# Patient Record
Sex: Male | Born: 1935 | Race: White | Hispanic: No | Marital: Married | Smoking: Never smoker
Health system: Southern US, Community
[De-identification: ages and names within clinical notes are randomized; demographics above are authoritative.]

## PROBLEM LIST (undated history)

## (undated) DIAGNOSIS — I10 Essential (primary) hypertension: Secondary | ICD-10-CM

## (undated) DIAGNOSIS — I442 Atrioventricular block, complete: Secondary | ICD-10-CM

## (undated) DIAGNOSIS — K219 Gastro-esophageal reflux disease without esophagitis: Secondary | ICD-10-CM

## (undated) DIAGNOSIS — C801 Malignant (primary) neoplasm, unspecified: Secondary | ICD-10-CM

## (undated) DIAGNOSIS — J189 Pneumonia, unspecified organism: Secondary | ICD-10-CM

## (undated) DIAGNOSIS — N529 Male erectile dysfunction, unspecified: Secondary | ICD-10-CM

## (undated) DIAGNOSIS — R011 Cardiac murmur, unspecified: Secondary | ICD-10-CM

## (undated) DIAGNOSIS — J45909 Unspecified asthma, uncomplicated: Secondary | ICD-10-CM

## (undated) DIAGNOSIS — R112 Nausea with vomiting, unspecified: Secondary | ICD-10-CM

## (undated) DIAGNOSIS — E739 Lactose intolerance, unspecified: Secondary | ICD-10-CM

## (undated) DIAGNOSIS — M1990 Unspecified osteoarthritis, unspecified site: Secondary | ICD-10-CM

## (undated) DIAGNOSIS — Z9889 Other specified postprocedural states: Secondary | ICD-10-CM

## (undated) DIAGNOSIS — N400 Enlarged prostate without lower urinary tract symptoms: Secondary | ICD-10-CM

## (undated) DIAGNOSIS — I330 Acute and subacute infective endocarditis: Secondary | ICD-10-CM

## (undated) DIAGNOSIS — G4733 Obstructive sleep apnea (adult) (pediatric): Secondary | ICD-10-CM

## (undated) DIAGNOSIS — T7840XA Allergy, unspecified, initial encounter: Secondary | ICD-10-CM

## (undated) DIAGNOSIS — Z950 Presence of cardiac pacemaker: Secondary | ICD-10-CM

## (undated) DIAGNOSIS — R0600 Dyspnea, unspecified: Secondary | ICD-10-CM

## (undated) DIAGNOSIS — I4891 Unspecified atrial fibrillation: Secondary | ICD-10-CM

## (undated) DIAGNOSIS — I509 Heart failure, unspecified: Secondary | ICD-10-CM

## (undated) HISTORY — PX: PACEMAKER PLACEMENT: SHX43

## (undated) HISTORY — DX: Lactose intolerance, unspecified: E73.9

## (undated) HISTORY — DX: Essential (primary) hypertension: I10

## (undated) HISTORY — DX: Heart failure, unspecified: I50.9

## (undated) HISTORY — DX: Unspecified asthma, uncomplicated: J45.909

## (undated) HISTORY — DX: Gastro-esophageal reflux disease without esophagitis: K21.9

## (undated) HISTORY — DX: Male erectile dysfunction, unspecified: N52.9

## (undated) HISTORY — PX: PARTIAL HIP ARTHROPLASTY: SHX733

## (undated) HISTORY — PX: INSERT / REPLACE / REMOVE PACEMAKER: SUR710

## (undated) HISTORY — DX: Obstructive sleep apnea (adult) (pediatric): G47.33

## (undated) HISTORY — DX: Atrioventricular block, complete: I44.2

## (undated) HISTORY — DX: Acute and subacute infective endocarditis: I33.0

## (undated) HISTORY — DX: Unspecified atrial fibrillation: I48.91

## (undated) HISTORY — DX: Allergy, unspecified, initial encounter: T78.40XA

## (undated) HISTORY — DX: Benign prostatic hyperplasia without lower urinary tract symptoms: N40.0

---

## 1998-12-17 ENCOUNTER — Ambulatory Visit (HOSPITAL_COMMUNITY): Admission: RE | Admit: 1998-12-17 | Discharge: 1998-12-17 | Payer: Self-pay | Admitting: Internal Medicine

## 1998-12-17 ENCOUNTER — Encounter: Payer: Self-pay | Admitting: Internal Medicine

## 2001-12-20 ENCOUNTER — Ambulatory Visit (HOSPITAL_COMMUNITY): Admission: RE | Admit: 2001-12-20 | Discharge: 2001-12-20 | Payer: Self-pay | Admitting: Internal Medicine

## 2002-02-21 ENCOUNTER — Encounter: Payer: Self-pay | Admitting: Internal Medicine

## 2002-02-21 ENCOUNTER — Encounter: Admission: RE | Admit: 2002-02-21 | Discharge: 2002-02-21 | Payer: Self-pay | Admitting: Internal Medicine

## 2002-03-29 ENCOUNTER — Encounter: Payer: Self-pay | Admitting: Internal Medicine

## 2002-03-29 LAB — HM COLONOSCOPY: HM Colonoscopy: NORMAL

## 2002-12-23 ENCOUNTER — Ambulatory Visit (HOSPITAL_COMMUNITY): Admission: RE | Admit: 2002-12-23 | Discharge: 2002-12-23 | Payer: Self-pay | Admitting: Urology

## 2004-09-25 ENCOUNTER — Ambulatory Visit: Payer: Self-pay | Admitting: Cardiology

## 2004-10-04 ENCOUNTER — Ambulatory Visit: Payer: Self-pay | Admitting: Cardiology

## 2004-10-23 ENCOUNTER — Ambulatory Visit: Payer: Self-pay | Admitting: Cardiology

## 2004-11-19 ENCOUNTER — Ambulatory Visit: Payer: Self-pay | Admitting: *Deleted

## 2004-12-19 ENCOUNTER — Ambulatory Visit: Payer: Self-pay | Admitting: Cardiology

## 2005-01-02 ENCOUNTER — Ambulatory Visit: Payer: Self-pay | Admitting: Cardiology

## 2005-01-22 ENCOUNTER — Ambulatory Visit: Payer: Self-pay | Admitting: Cardiology

## 2005-02-19 ENCOUNTER — Ambulatory Visit: Payer: Self-pay | Admitting: *Deleted

## 2005-03-19 ENCOUNTER — Ambulatory Visit: Payer: Self-pay | Admitting: Cardiology

## 2005-04-16 ENCOUNTER — Ambulatory Visit: Payer: Self-pay | Admitting: *Deleted

## 2005-05-14 ENCOUNTER — Ambulatory Visit: Payer: Self-pay | Admitting: *Deleted

## 2005-06-11 ENCOUNTER — Ambulatory Visit: Payer: Self-pay | Admitting: Cardiology

## 2005-07-09 ENCOUNTER — Ambulatory Visit: Payer: Self-pay | Admitting: Cardiology

## 2005-08-06 ENCOUNTER — Ambulatory Visit: Payer: Self-pay | Admitting: Cardiology

## 2005-08-18 ENCOUNTER — Ambulatory Visit (HOSPITAL_BASED_OUTPATIENT_CLINIC_OR_DEPARTMENT_OTHER): Admission: RE | Admit: 2005-08-18 | Discharge: 2005-08-18 | Payer: Self-pay | Admitting: Otolaryngology

## 2005-08-24 ENCOUNTER — Ambulatory Visit: Payer: Self-pay | Admitting: Internal Medicine

## 2005-09-03 ENCOUNTER — Ambulatory Visit: Payer: Self-pay | Admitting: Cardiology

## 2005-10-01 ENCOUNTER — Ambulatory Visit: Payer: Self-pay | Admitting: Cardiology

## 2005-10-08 ENCOUNTER — Ambulatory Visit (HOSPITAL_BASED_OUTPATIENT_CLINIC_OR_DEPARTMENT_OTHER): Admission: RE | Admit: 2005-10-08 | Discharge: 2005-10-08 | Payer: Self-pay | Admitting: Otolaryngology

## 2005-10-12 ENCOUNTER — Ambulatory Visit: Payer: Self-pay | Admitting: Internal Medicine

## 2005-10-29 ENCOUNTER — Ambulatory Visit: Payer: Self-pay | Admitting: *Deleted

## 2005-11-26 ENCOUNTER — Ambulatory Visit: Payer: Self-pay | Admitting: Cardiology

## 2005-12-24 ENCOUNTER — Ambulatory Visit: Payer: Self-pay | Admitting: Cardiology

## 2006-01-14 ENCOUNTER — Ambulatory Visit: Payer: Self-pay | Admitting: Internal Medicine

## 2006-01-20 ENCOUNTER — Ambulatory Visit: Payer: Self-pay | Admitting: Internal Medicine

## 2006-01-21 ENCOUNTER — Ambulatory Visit: Payer: Self-pay | Admitting: Internal Medicine

## 2006-02-18 ENCOUNTER — Ambulatory Visit: Payer: Self-pay | Admitting: *Deleted

## 2006-03-18 ENCOUNTER — Ambulatory Visit: Payer: Self-pay | Admitting: *Deleted

## 2006-04-15 ENCOUNTER — Ambulatory Visit: Payer: Self-pay | Admitting: Cardiology

## 2006-05-13 ENCOUNTER — Ambulatory Visit: Payer: Self-pay | Admitting: *Deleted

## 2006-06-10 ENCOUNTER — Ambulatory Visit: Payer: Self-pay | Admitting: *Deleted

## 2006-07-08 ENCOUNTER — Ambulatory Visit: Payer: Self-pay | Admitting: *Deleted

## 2006-08-05 ENCOUNTER — Ambulatory Visit: Payer: Self-pay | Admitting: Cardiovascular Disease

## 2006-09-01 ENCOUNTER — Ambulatory Visit: Payer: Self-pay | Admitting: Internal Medicine

## 2006-09-29 ENCOUNTER — Ambulatory Visit: Payer: Self-pay | Admitting: *Deleted

## 2006-10-16 ENCOUNTER — Ambulatory Visit: Payer: Self-pay | Admitting: Gastroenterology

## 2006-10-20 ENCOUNTER — Ambulatory Visit: Payer: Self-pay | Admitting: Cardiology

## 2006-10-20 ENCOUNTER — Ambulatory Visit (HOSPITAL_COMMUNITY): Admission: RE | Admit: 2006-10-20 | Discharge: 2006-10-20 | Payer: Self-pay | Admitting: Gastroenterology

## 2006-11-27 ENCOUNTER — Ambulatory Visit: Payer: Self-pay | Admitting: Cardiology

## 2006-12-11 ENCOUNTER — Ambulatory Visit: Payer: Self-pay | Admitting: Cardiovascular Disease

## 2007-01-08 ENCOUNTER — Ambulatory Visit: Payer: Self-pay | Admitting: Cardiology

## 2007-01-15 ENCOUNTER — Ambulatory Visit: Payer: Self-pay | Admitting: Internal Medicine

## 2007-02-01 ENCOUNTER — Ambulatory Visit: Payer: Self-pay

## 2007-02-01 ENCOUNTER — Encounter: Payer: Self-pay | Admitting: Cardiology

## 2007-02-05 ENCOUNTER — Ambulatory Visit: Payer: Self-pay | Admitting: Cardiovascular Disease

## 2007-03-05 ENCOUNTER — Ambulatory Visit: Payer: Self-pay | Admitting: Cardiovascular Disease

## 2007-04-02 ENCOUNTER — Ambulatory Visit: Payer: Self-pay | Admitting: Cardiology

## 2007-04-30 ENCOUNTER — Ambulatory Visit: Payer: Self-pay | Admitting: Cardiology

## 2007-06-04 ENCOUNTER — Ambulatory Visit: Payer: Self-pay | Admitting: Cardiology

## 2007-07-02 ENCOUNTER — Ambulatory Visit: Payer: Self-pay | Admitting: Cardiology

## 2007-07-21 ENCOUNTER — Encounter: Payer: Self-pay | Admitting: Internal Medicine

## 2007-08-03 ENCOUNTER — Ambulatory Visit: Payer: Self-pay | Admitting: Cardiology

## 2007-08-18 ENCOUNTER — Ambulatory Visit: Payer: Self-pay | Admitting: Internal Medicine

## 2007-08-27 ENCOUNTER — Ambulatory Visit: Payer: Self-pay | Admitting: Internal Medicine

## 2007-09-27 ENCOUNTER — Ambulatory Visit: Payer: Self-pay | Admitting: Cardiology

## 2007-10-04 ENCOUNTER — Inpatient Hospital Stay (HOSPITAL_COMMUNITY): Admission: RE | Admit: 2007-10-04 | Discharge: 2007-10-06 | Payer: Self-pay | Admitting: Orthopedic Surgery

## 2007-10-18 ENCOUNTER — Encounter: Payer: Self-pay | Admitting: Internal Medicine

## 2007-10-21 DIAGNOSIS — I4891 Unspecified atrial fibrillation: Secondary | ICD-10-CM

## 2007-10-21 DIAGNOSIS — J309 Allergic rhinitis, unspecified: Secondary | ICD-10-CM | POA: Insufficient documentation

## 2007-10-21 DIAGNOSIS — J4489 Other specified chronic obstructive pulmonary disease: Secondary | ICD-10-CM | POA: Insufficient documentation

## 2007-10-21 DIAGNOSIS — J449 Chronic obstructive pulmonary disease, unspecified: Secondary | ICD-10-CM

## 2007-10-21 DIAGNOSIS — R351 Nocturia: Secondary | ICD-10-CM

## 2007-10-21 DIAGNOSIS — I4821 Permanent atrial fibrillation: Secondary | ICD-10-CM | POA: Insufficient documentation

## 2007-10-21 DIAGNOSIS — K219 Gastro-esophageal reflux disease without esophagitis: Secondary | ICD-10-CM

## 2007-10-21 DIAGNOSIS — N401 Enlarged prostate with lower urinary tract symptoms: Secondary | ICD-10-CM

## 2007-10-21 DIAGNOSIS — G4733 Obstructive sleep apnea (adult) (pediatric): Secondary | ICD-10-CM

## 2007-10-21 DIAGNOSIS — I330 Acute and subacute infective endocarditis: Secondary | ICD-10-CM | POA: Insufficient documentation

## 2007-10-21 DIAGNOSIS — I10 Essential (primary) hypertension: Secondary | ICD-10-CM

## 2007-10-22 ENCOUNTER — Ambulatory Visit: Payer: Self-pay | Admitting: Internal Medicine

## 2007-10-22 DIAGNOSIS — M8580 Other specified disorders of bone density and structure, unspecified site: Secondary | ICD-10-CM

## 2007-10-22 DIAGNOSIS — E739 Lactose intolerance, unspecified: Secondary | ICD-10-CM

## 2007-10-22 HISTORY — DX: Lactose intolerance, unspecified: E73.9

## 2007-10-25 ENCOUNTER — Encounter: Payer: Self-pay | Admitting: Internal Medicine

## 2007-10-26 LAB — CONVERTED CEMR LAB
BUN: 23 mg/dL (ref 6–23)
CO2: 31 meq/L (ref 19–32)
Creatinine, Ser: 1.1 mg/dL (ref 0.4–1.5)
HDL: 73.4 mg/dL (ref >39.0)
Potassium: 4.8 meq/L (ref 3.5–5.1)
Triglycerides: 45 mg/dL (ref 0–149)

## 2007-11-15 ENCOUNTER — Ambulatory Visit: Payer: Self-pay | Admitting: Cardiology

## 2007-12-13 ENCOUNTER — Ambulatory Visit: Payer: Self-pay | Admitting: Cardiology

## 2008-01-10 ENCOUNTER — Ambulatory Visit: Payer: Self-pay | Admitting: Cardiovascular Disease

## 2008-02-07 ENCOUNTER — Ambulatory Visit: Payer: Self-pay | Admitting: Internal Medicine

## 2008-03-06 ENCOUNTER — Ambulatory Visit: Payer: Self-pay | Admitting: Cardiology

## 2008-03-31 ENCOUNTER — Ambulatory Visit: Payer: Self-pay | Admitting: Cardiology

## 2008-04-14 ENCOUNTER — Ambulatory Visit: Payer: Self-pay | Admitting: Internal Medicine

## 2008-04-14 DIAGNOSIS — R232 Flushing: Secondary | ICD-10-CM

## 2008-04-28 ENCOUNTER — Ambulatory Visit: Payer: Self-pay | Admitting: Cardiovascular Disease

## 2008-05-29 ENCOUNTER — Ambulatory Visit: Payer: Self-pay | Admitting: Internal Medicine

## 2008-06-26 ENCOUNTER — Ambulatory Visit: Payer: Self-pay | Admitting: Cardiology

## 2008-07-19 ENCOUNTER — Encounter: Payer: Self-pay | Admitting: Internal Medicine

## 2008-07-19 LAB — CONVERTED CEMR LAB: Vit D, 25-Hydroxy: 32.4 ng/mL

## 2008-07-24 ENCOUNTER — Ambulatory Visit: Payer: Self-pay | Admitting: Cardiology

## 2008-08-08 ENCOUNTER — Ambulatory Visit: Payer: Self-pay | Admitting: Internal Medicine

## 2008-08-21 ENCOUNTER — Ambulatory Visit: Payer: Self-pay | Admitting: Cardiovascular Disease

## 2008-09-18 ENCOUNTER — Ambulatory Visit: Payer: Self-pay | Admitting: Cardiology

## 2008-10-10 ENCOUNTER — Encounter: Payer: Self-pay | Admitting: Internal Medicine

## 2008-10-11 ENCOUNTER — Ambulatory Visit: Payer: Self-pay | Admitting: Internal Medicine

## 2008-10-16 ENCOUNTER — Ambulatory Visit: Payer: Self-pay | Admitting: Internal Medicine

## 2008-10-17 ENCOUNTER — Ambulatory Visit: Payer: Self-pay | Admitting: Internal Medicine

## 2008-10-17 LAB — CONVERTED CEMR LAB
ALT: 16 units/L
Albumin: 3.3 g/dL — ABNORMAL LOW (ref 3.5–5.2)
BUN: 15 mg/dL (ref 6–23)
CO2: 35 meq/L
Calcium: 8.8 mg/dL (ref 8.4–10.5)
Calcium: 9.7 mg/dL
Chloride: 100 meq/L
Cholesterol: 137 mg/dL (ref 0–200)
Creatinine, Ser: 0.8 mg/dL (ref 0.4–1.5)
Creatinine, Ser: 1.1 mg/dL
Digitoxin Lvl: 0.5 ng/mL — ABNORMAL LOW (ref 0.8–2.0)
Eosinophils Absolute: 0.1 10*3/uL (ref 0.0–0.7)
Eosinophils Relative: 2.6 % (ref 0.0–5.0)
GFR calc Af Amer: 121 mL/min
Glucose, Bld: 71 mg/dL (ref 70–99)
HDL: 83 mg/dL (ref >39.0)
LDL Cholesterol: 49 mg/dL (ref 0–99)
MCHC: 34 g/dL (ref 30.0–36.0)
Monocytes Absolute: 0.4 10*3/uL (ref 0.1–1.0)
Monocytes Relative: 10.6 % (ref 3.0–12.0)
Neutro Abs: 2.4 10*3/uL (ref 1.4–7.7)
Neutrophils Relative %: 55.9 % (ref 43.0–77.0)
Platelets: 123 10*3/uL — ABNORMAL LOW (ref 150–400)
Potassium: 4.6 meq/L
RBC: 4.34 M/uL (ref 4.22–5.81)
RDW: 12.3 % (ref 11.5–14.6)
TSH: 2.76 microintl units/mL (ref 0.35–5.50)
Testosterone: 630.66 ng/dL (ref 350–890)
Total Protein: 6.4 g/dL (ref 6.0–8.3)
VLDL: 5 mg/dL (ref 0–40)
Vit D, 1,25-Dihydroxy: 51 (ref 30–89)

## 2008-10-18 ENCOUNTER — Encounter: Payer: Self-pay | Admitting: Internal Medicine

## 2008-10-18 LAB — CONVERTED CEMR LAB
Eosinophils Relative: 2.1 %
HCT: 41 %
Lymphocytes Relative: 23.6 %
Lymphs Abs: 1.1 10*3/uL
Neutrophils Relative %: 65.4 %
Platelets: 132 10*3/uL
WBC: 4.6 10*3/uL

## 2008-10-27 ENCOUNTER — Encounter: Payer: Self-pay | Admitting: Internal Medicine

## 2008-11-13 ENCOUNTER — Ambulatory Visit: Payer: Self-pay | Admitting: Cardiology

## 2008-11-28 ENCOUNTER — Ambulatory Visit: Payer: Self-pay | Admitting: Internal Medicine

## 2008-12-12 ENCOUNTER — Ambulatory Visit: Payer: Self-pay | Admitting: Internal Medicine

## 2008-12-15 ENCOUNTER — Encounter: Payer: Self-pay | Admitting: Internal Medicine

## 2008-12-15 LAB — CONVERTED CEMR LAB
5-HIAA, 24 Hr Urine: 5 mg/(24.h) (ref <=6.0)
Epinephrine 24 Hr Urine: 6 mcg/24hr (ref <20)
Norepinephrine 24 Hr Urine: 68 mcg/24hr (ref <80)

## 2008-12-25 ENCOUNTER — Telehealth: Payer: Self-pay | Admitting: Internal Medicine

## 2008-12-26 ENCOUNTER — Ambulatory Visit: Payer: Self-pay | Admitting: Cardiology

## 2009-01-22 ENCOUNTER — Ambulatory Visit: Payer: Self-pay | Admitting: Cardiology

## 2009-02-02 ENCOUNTER — Encounter: Payer: Self-pay | Admitting: Internal Medicine

## 2009-02-07 ENCOUNTER — Encounter: Payer: Self-pay | Admitting: Internal Medicine

## 2009-02-07 ENCOUNTER — Ambulatory Visit: Payer: Self-pay | Admitting: Internal Medicine

## 2009-02-07 DIAGNOSIS — Z950 Presence of cardiac pacemaker: Secondary | ICD-10-CM | POA: Insufficient documentation

## 2009-02-07 DIAGNOSIS — I429 Cardiomyopathy, unspecified: Secondary | ICD-10-CM

## 2009-02-19 ENCOUNTER — Ambulatory Visit: Payer: Self-pay | Admitting: Cardiology

## 2009-03-12 ENCOUNTER — Telehealth: Payer: Self-pay | Admitting: Internal Medicine

## 2009-03-19 ENCOUNTER — Ambulatory Visit: Payer: Self-pay | Admitting: Cardiology

## 2009-03-26 ENCOUNTER — Telehealth (INDEPENDENT_AMBULATORY_CARE_PROVIDER_SITE_OTHER): Payer: Self-pay | Admitting: *Deleted

## 2009-04-13 ENCOUNTER — Ambulatory Visit: Payer: Self-pay | Admitting: Cardiology

## 2009-04-24 ENCOUNTER — Encounter: Payer: Self-pay | Admitting: *Deleted

## 2009-05-10 ENCOUNTER — Ambulatory Visit: Payer: Self-pay | Admitting: Internal Medicine

## 2009-05-10 ENCOUNTER — Encounter (INDEPENDENT_AMBULATORY_CARE_PROVIDER_SITE_OTHER): Payer: Self-pay | Admitting: Cardiology

## 2009-05-10 LAB — CONVERTED CEMR LAB
POC INR: 2
Protime: 17.5

## 2009-05-30 ENCOUNTER — Encounter: Payer: Self-pay | Admitting: *Deleted

## 2009-06-07 ENCOUNTER — Ambulatory Visit: Payer: Self-pay | Admitting: Cardiology

## 2009-06-07 LAB — CONVERTED CEMR LAB
POC INR: 1.7
Prothrombin Time: 15.9 s

## 2009-06-11 ENCOUNTER — Encounter: Payer: Self-pay | Admitting: Internal Medicine

## 2009-06-25 ENCOUNTER — Telehealth (INDEPENDENT_AMBULATORY_CARE_PROVIDER_SITE_OTHER): Payer: Self-pay | Admitting: *Deleted

## 2009-07-05 ENCOUNTER — Encounter (INDEPENDENT_AMBULATORY_CARE_PROVIDER_SITE_OTHER): Payer: Self-pay | Admitting: *Deleted

## 2009-07-05 ENCOUNTER — Encounter (INDEPENDENT_AMBULATORY_CARE_PROVIDER_SITE_OTHER): Payer: Self-pay

## 2009-07-05 ENCOUNTER — Encounter: Payer: Self-pay | Admitting: Internal Medicine

## 2009-07-05 ENCOUNTER — Ambulatory Visit: Payer: Self-pay | Admitting: Internal Medicine

## 2009-07-05 LAB — CONVERTED CEMR LAB: POC INR: 2.3

## 2009-08-02 ENCOUNTER — Ambulatory Visit: Payer: Self-pay | Admitting: Cardiovascular Disease

## 2009-08-02 LAB — CONVERTED CEMR LAB: POC INR: 2.5

## 2009-08-16 ENCOUNTER — Ambulatory Visit: Payer: Self-pay | Admitting: Internal Medicine

## 2009-08-16 DIAGNOSIS — B356 Tinea cruris: Secondary | ICD-10-CM

## 2009-08-22 ENCOUNTER — Telehealth: Payer: Self-pay | Admitting: Internal Medicine

## 2009-08-30 ENCOUNTER — Ambulatory Visit: Payer: Self-pay | Admitting: Internal Medicine

## 2009-08-30 LAB — CONVERTED CEMR LAB: POC INR: 2.3

## 2009-09-07 ENCOUNTER — Encounter: Payer: Self-pay | Admitting: Internal Medicine

## 2009-09-17 ENCOUNTER — Encounter: Payer: Self-pay | Admitting: Internal Medicine

## 2009-09-27 ENCOUNTER — Ambulatory Visit: Payer: Self-pay | Admitting: Internal Medicine

## 2009-10-24 ENCOUNTER — Encounter: Payer: Self-pay | Admitting: Internal Medicine

## 2009-10-25 ENCOUNTER — Ambulatory Visit: Payer: Self-pay | Admitting: Cardiovascular Disease

## 2009-10-26 ENCOUNTER — Telehealth: Payer: Self-pay | Admitting: Internal Medicine

## 2009-10-30 ENCOUNTER — Telehealth (INDEPENDENT_AMBULATORY_CARE_PROVIDER_SITE_OTHER): Payer: Self-pay | Admitting: *Deleted

## 2009-11-22 ENCOUNTER — Ambulatory Visit: Payer: Self-pay | Admitting: Internal Medicine

## 2009-12-17 ENCOUNTER — Encounter: Payer: Self-pay | Admitting: Internal Medicine

## 2009-12-20 ENCOUNTER — Ambulatory Visit: Payer: Self-pay | Admitting: Internal Medicine

## 2010-01-15 ENCOUNTER — Ambulatory Visit: Payer: Self-pay | Admitting: Cardiology

## 2010-02-12 ENCOUNTER — Ambulatory Visit: Payer: Self-pay | Admitting: Cardiovascular Disease

## 2010-02-19 ENCOUNTER — Ambulatory Visit: Payer: Self-pay | Admitting: Internal Medicine

## 2010-03-12 ENCOUNTER — Ambulatory Visit: Payer: Self-pay | Admitting: Cardiovascular Disease

## 2010-03-12 LAB — CONVERTED CEMR LAB: POC INR: 1.8

## 2010-04-08 ENCOUNTER — Ambulatory Visit: Payer: Self-pay | Admitting: Cardiovascular Disease

## 2010-04-08 LAB — CONVERTED CEMR LAB: POC INR: 2.6

## 2010-04-19 ENCOUNTER — Telehealth (INDEPENDENT_AMBULATORY_CARE_PROVIDER_SITE_OTHER): Payer: Self-pay | Admitting: Pharmacist

## 2010-05-06 ENCOUNTER — Ambulatory Visit: Payer: Self-pay | Admitting: Cardiology

## 2010-06-04 ENCOUNTER — Ambulatory Visit: Payer: Self-pay | Admitting: Cardiovascular Disease

## 2010-06-04 LAB — CONVERTED CEMR LAB: POC INR: 1.7

## 2010-06-17 ENCOUNTER — Encounter: Payer: Self-pay | Admitting: Internal Medicine

## 2010-07-02 ENCOUNTER — Ambulatory Visit: Payer: Self-pay | Admitting: Cardiology

## 2010-07-02 LAB — CONVERTED CEMR LAB: POC INR: 1.9

## 2010-07-30 ENCOUNTER — Ambulatory Visit: Payer: Self-pay | Admitting: Cardiovascular Disease

## 2010-07-30 LAB — CONVERTED CEMR LAB: POC INR: 2.2

## 2010-08-05 ENCOUNTER — Ambulatory Visit: Payer: Self-pay | Admitting: Internal Medicine

## 2010-08-05 LAB — CONVERTED CEMR LAB
CO2: 29 meq/L (ref 19–32)
Chloride: 101 meq/L (ref 96–112)
Glucose, Bld: 85 mg/dL (ref 70–99)
Platelets: 124 10*3/uL — ABNORMAL LOW (ref 150–400)
Potassium: 4.8 meq/L (ref 3.5–5.3)
RBC: 4.7 M/uL (ref 4.22–5.81)
Sodium: 140 meq/L (ref 135–145)
WBC: 4.5 10*3/uL (ref 4.0–10.5)

## 2010-08-06 ENCOUNTER — Encounter: Payer: Self-pay | Admitting: Internal Medicine

## 2010-08-07 ENCOUNTER — Encounter: Payer: Self-pay | Admitting: Internal Medicine

## 2010-09-02 ENCOUNTER — Ambulatory Visit: Payer: Self-pay | Admitting: Cardiology

## 2010-09-02 LAB — CONVERTED CEMR LAB: POC INR: 2.6

## 2010-09-04 ENCOUNTER — Telehealth: Payer: Self-pay | Admitting: Internal Medicine

## 2010-09-06 ENCOUNTER — Encounter: Payer: Self-pay | Admitting: Internal Medicine

## 2010-09-06 ENCOUNTER — Ambulatory Visit: Payer: Self-pay | Admitting: Internal Medicine

## 2010-09-19 ENCOUNTER — Telehealth: Payer: Self-pay | Admitting: Internal Medicine

## 2010-09-30 ENCOUNTER — Ambulatory Visit: Payer: Self-pay | Admitting: Internal Medicine

## 2010-09-30 LAB — CONVERTED CEMR LAB: POC INR: 2.7

## 2010-10-28 ENCOUNTER — Ambulatory Visit: Payer: Self-pay | Admitting: Internal Medicine

## 2010-10-28 LAB — CONVERTED CEMR LAB: POC INR: 2.3

## 2010-11-11 ENCOUNTER — Encounter: Payer: Self-pay | Admitting: Internal Medicine

## 2010-11-26 ENCOUNTER — Ambulatory Visit: Admission: RE | Admit: 2010-11-26 | Discharge: 2010-11-26 | Payer: Self-pay | Source: Home / Self Care

## 2010-12-24 ENCOUNTER — Ambulatory Visit: Admission: RE | Admit: 2010-12-24 | Discharge: 2010-12-24 | Payer: Self-pay | Source: Home / Self Care

## 2010-12-24 LAB — CONVERTED CEMR LAB: POC INR: 2.2

## 2010-12-24 NOTE — Assessment & Plan Note (Signed)
Summary: 1 YEAR FOLLOW UP/MHF   Vital Signs:  Patient profile:   77 year old male Height:      70 inches Weight:      159 pounds BMI:     22.90 O2 Sat:      98 % on Room air Temp:     97.8 degrees F oral Pulse rate:   63 / minute Pulse rhythm:   regular Resp:     16 per minute BP sitting:   120 / 70  (right arm) Cuff size:   regular  Vitals Entered By: Glendell Docker CMA (August 05, 2010 9:03 AM)  O2 Flow:  Room air CC: follow-up visit Is Patient Diabetic? No Pain Assessment Patient in pain? no      Comments discuss having pacemaker checked, non fasting   Primary Care Provider:  Dondra Spry DO  CC:  follow-up visit.  History of Present Illness: 77 y/o white male with hx of afib and hypetension for f/u doing well no chest pain no dizziness  no abnormal bleeding  Preventive Screening-Counseling & Management  Alcohol-Tobacco     Smoking Status: never  Allergies (verified): No Known Drug Allergies  Past History:  Past Medical History: hx of SBE - 1968 Atrial fibrillation  Bradycardia Hypertension Complete heart block s/p pacer-[employer] COPD OSA GERD Congestive heart failure Allergic rhinitis Benign prostatic hypertrophy right hip djd Osteoporosis  glucose intolerance  Erectile dysfuction   Past Surgical History: s/p pacemaker 1996  s/p right hip replacement 2008    Family History: CAD colon polyps brother with DM multiple famlily with heart disease Prostate ca - father     son having ins problems- disabled from truck accident  Social History: Never Smoked Alcohol use-no Married 2 children (son in [place],  daughter in [place], [place]) Retired Charity fundraiser      Physical Exam  General:  alert, well-developed, and well-nourished.   Lungs:  normal respiratory effort and normal breath sounds.   Heart:  no gallop and irregular rhythm.   Abdomen:  soft, non-tender, and normal bowel sounds.   Extremities:  trace left pedal edema and  trace right pedal edema.   Neurologic:  gait normal.   Psych:  memory intact for recent and remote and normally interactive.     Contraindications/Deferment of Procedures/Staging:    Test/Procedure: Zoster vaccine    Reason for deferment: declined   Impression & Recommendations:  Problem # 1:  ATRIAL FIBRILLATION (ICD-427.31) Assessment Unchanged  His updated medication list for this problem includes:    Verapamil Hcl Cr 240 Mg Cp24 (Verapamil hcl) .Marland Kitchen... Take 1 tablet by mouth once a day    Coumadin 5 Mg Tabs (Warfarin sodium) .Marland Kitchen... Take as directed by coumadin clinic.    Digoxin 0.25 Mg Tabs (Digoxin) .Marland Kitchen... 1po once daily  Orders: T-Basic Metabolic Panel ([id number]) T-CBC No Diff ([id number]) T-TSH ([id number])  Reviewed the following: PT: 15.9 (06/07/2009)    Coumadin Dose (weekly): 32.50 mg (07/30/2010) Prior Coumadin Dose (weekly): 32.50 mg (07/30/2010) Next Protime: 08/27/2010 (dated on 07/30/2010)  Problem # 2:  HYPERTENSION (ICD-401.9) Assessment: Unchanged  His updated medication list for this problem includes:    Verapamil Hcl Cr 240 Mg Cp24 (Verapamil hcl) .Marland Kitchen... Take 1 tablet by mouth once a day    Ramipril 5 Mg Caps (Ramipril) .Marland Kitchen... 1 by mouth bid  Orders: T-Basic Metabolic Panel [id number])  BP today: 120/70 Prior BP: 128/74 (02/19/2010)  Labs Reviewed: K+: 4.6 (10/17/2008) Creat: : 1.1 (10/17/2008)  Chol: 137 (10/17/2008)   HDL: 83.0 (10/17/2008)   LDL: 49 (10/17/2008)   TG: 25 (10/17/2008)  Complete Medication List: 1)  Verapamil Hcl Cr 240 Mg Cp24 (Verapamil hcl) .... Take 1 tablet by mouth once a day 2)  Coumadin 5 Mg Tabs (Warfarin sodium) .... Take as directed by coumadin clinic. 3)  Ramipril 5 Mg Caps (Ramipril) .Marland Kitchen.. 1 by mouth bid 4)  Digoxin 0.25 Mg Tabs (Digoxin) .Marland Kitchen.. 1po once daily 5)  Cialis 2.5 Mg Tabs (Tadalafil) .... As needed 6)  Vitamin D3 1000 Unit Tabs (Cholecalciferol) .... Take 1 tablet by mouth once a day 7)  Oscal  500/200 D-3 500-200 Mg-unit Tabs (Calcium-vitamin d) .... Take 1 tablet by mouth two times a day 8)  Vision Formula Tabs (Multiple vitamins-minerals) .... Take 1 tablet by mouth once a day 9)  Reclast 5 Mg/162ml Soln (Zoledronic acid) .... Once yearly  last infusion 12/0/09  Other Orders: Influenza Vaccine MCR (00025) Flu Vaccine 9yrs + MEDICARE PATIENTS (A5409) T- * Misc. Laboratory test ([id number])  Patient Instructions: 1)  Please schedule a follow-up appointment in 1 year.  Current Allergies (reviewed today): No known allergies    Immunizations Administered:  Influenza Vaccine # 1:    Vaccine Type: Fluvax MCR    Site: left deltoid    Mfr: GlaxoSmithKline    Dose: 0.5 ml    Route: IM    Given by: Glendell Docker CMA    Exp. Date: 05/24/2011    Lot #: [id number]    VIS given: 06/18/10 version given August 05, 2010.  Flu Vaccine Consent Questions:    Do you have a history of severe allergic reactions to this vaccine? no    Any prior history of allergic reactions to egg and/or gelatin? no    Do you have a sensitivity to the preservative Thimersol? no    Do you have a past history of Guillan-Barre Syndrome? no    Do you currently have an acute febrile illness? no    Have you ever had a severe reaction to latex? no    Vaccine information given and explained to patient? yes

## 2010-12-24 NOTE — Cardiovascular Report (Signed)
Summary: TTM  TTM   Imported By: Roderic Ovens 01/03/2010 11:53:13  _____________________________________________________________________  External Attachment:    Type:   Image     Comment:   External Document

## 2010-12-24 NOTE — Progress Notes (Signed)
Summary: needs dexa scan   Phone Note Call from Patient Call back at Home Phone [phone]   Caller: Patient Call For: Cambree Hendrix  Summary of Call: He was to have a Dexa Scan at his urologist recommendation this week.  He did not have the scan as they advised him that Medicare sometimes does not pay for the scan and he would be responsible for up to $250.  He wondered if you could arrange the scan and Medicare pay if it is ordered by his primary care doctor.  He had the scan done over two years ago.   Initial call taken by: Roselle Locus,  September 04, 2010 3:00 PM  Follow-up for Phone Call        Pt  scheduled Dexa Scan  [place] office  Oct 14th Follow-up by: Darral Dash,  September 05, 2010 3:07 PM

## 2010-12-24 NOTE — Cardiovascular Report (Signed)
Summary: TTM   TTM   Imported By: Roderic Ovens 08/12/2010 11:00:45  _____________________________________________________________________  External Attachment:    Type:   Image     Comment:   External Document

## 2010-12-24 NOTE — Medication Information (Signed)
Summary: rov/ewj  Anticoagulant Therapy  Managed by: Weston Brass, PharmD Referring MD: Sherryl Manges MD PCP: Dondra Spry DO Supervising MD: Clifton James MD, Cristal Deer Indication 1: Atrial Fibrillation (ICD-427.31) Lab Used: [facility] Site: [facility] INR POC 2.6 INR RANGE 2 - 3  Dietary changes: yes       Details: increased greens since last visit  Health status changes: no    Bleeding/hemorrhagic complications: no    Recent/future hospitalizations: no    Any changes in medication regimen? no    Recent/future dental: no  Any missed doses?: yes     Details: may have missed a dose but unsure  Is patient compliant with meds? yes       Allergies: No Known Drug Allergies  Anticoagulation Management History:      The patient is taking warfarin and comes in today for a routine follow up visit.  Positive risk factors for bleeding include an age of 75 years or older.  The bleeding index is 'intermediate risk'.  Positive CHADS2 values include History of CHF, History of HTN, and Age > 39 years old.  The start date was 10/26/1998.  Anticoagulation responsible provider: Clifton James MD, Cristal Deer.  INR POC: 2.6.  Cuvette Lot#: [id number].  Exp: 06/2011.    Anticoagulation Management Assessment/Plan:      The patient's current anticoagulation dose is Coumadin 5 mg  tabs: Take as directed by coumadin clinic..  The target INR is 2 - 3.  The next INR is due 05/06/2010.  Anticoagulation instructions were given to patient.  Results were reviewed/authorized by Weston Brass, PharmD.  He was notified by Weston Brass PharmD.         Prior Anticoagulation Instructions: INR 1.8  Take 1.5 tablets today then resume same dosage 1 tablet daily except 1/2 tablet on Mondays and Fridays.  Recheck in 4 weeks.    Current Anticoagulation Instructions: INR 2.6  Continue same dose of 1 tablet every day except 1/2 tablet on Monday and Friday

## 2010-12-24 NOTE — Medication Information (Signed)
Summary: ROV/SP  Anticoagulant Therapy  Managed by: Weston Brass, PharmD Referring MD: Sherryl Manges MD PCP: Dondra Spry DO Supervising MD: Antoine Poche MD, Fayrene Fearing Indication 1: Atrial Fibrillation (ICD-427.31) Lab Used: [facility] Site: [facility] INR POC 2.2 INR RANGE 2 - 3  Dietary changes: yes       Details: Pt admits to self-adjusting his dose when he eats greens; informed him we would rather him not do this but he is insistent and his dose has been stable  Health status changes: no    Bleeding/hemorrhagic complications: no    Recent/future hospitalizations: no    Any changes in medication regimen? no    Recent/future dental: no  Any missed doses?: no       Is patient compliant with meds? yes       Allergies: No Known Drug Allergies  Anticoagulation Management History:      The patient is taking warfarin and comes in today for a routine follow up visit.  Positive risk factors for bleeding include an age of 75 years or older.  The bleeding index is 'intermediate risk'.  Positive CHADS2 values include History of CHF, History of HTN, and Age > 12 years old.  The start date was 10/26/1998.  Anticoagulation responsible provider: Antoine Poche MD, Fayrene Fearing.  INR POC: 2.2.  Cuvette Lot#: [id number].  Exp: 06/2011.    Anticoagulation Management Assessment/Plan:      The patient's current anticoagulation dose is Coumadin 5 mg  tabs: Take as directed by coumadin clinic..  The target INR is 2 - 3.  The next INR is due 06/04/2010.  Anticoagulation instructions were given to patient.  Results were reviewed/authorized by Weston Brass, PharmD.  He was notified by Weston Brass PharmD.         Prior Anticoagulation Instructions: INR 2.6  Continue same dose of 1 tablet every day except 1/2 tablet on Monday and Friday   Current Anticoagulation Instructions: INR 2.2  Continue same dose of 1 tablet every day except 1/2 tablet on Monday and Friday

## 2010-12-24 NOTE — Medication Information (Signed)
Summary: rov/ewj  Anticoagulant Therapy  Managed by: Cloyde Reams, RN, BSN Referring MD: Sherryl Manges MD PCP: Dondra Spry DO Supervising MD: Excell Seltzer MD, Casimiro Needle Indication 1: Atrial Fibrillation (ICD-427.31) Lab Used: [facility] Site: [facility] INR POC 1.8 INR RANGE 2 - 3  Dietary changes: no    Health status changes: no    Bleeding/hemorrhagic complications: no    Recent/future hospitalizations: no    Any changes in medication regimen? no    Recent/future dental: no  Any missed doses?: no       Is patient compliant with meds? yes       Allergies (verified): No Known Drug Allergies  Anticoagulation Management History:      The patient is taking warfarin and comes in today for a routine follow up visit.  Positive risk factors for bleeding include an age of 75 years or older.  The bleeding index is 'intermediate risk'.  Positive CHADS2 values include History of CHF, History of HTN, and Age > 32 years old.  The start date was 10/26/1998.  Anticoagulation responsible provider: Excell Seltzer MD, Casimiro Needle.  INR POC: 1.8.  Cuvette Lot#: [id number].  Exp: 04/2011.    Anticoagulation Management Assessment/Plan:      The patient's current anticoagulation dose is Coumadin 5 mg  tabs: Take as directed by coumadin clinic..  The target INR is 2 - 3.  The next INR is due 04/09/2010.  Anticoagulation instructions were given to patient.  Results were reviewed/authorized by Cloyde Reams, RN, BSN.  He was notified by Cloyde Reams RN.         Prior Anticoagulation Instructions: INR 2.1  Continue on same dosage 1 tablet daily except 1/2 tablet on Mondays and Fridays.  Recheck in 4 weeks.    Current Anticoagulation Instructions: INR 1.8  Take 1.5 tablets today then resume same dosage 1 tablet daily except 1/2 tablet on Mondays and Fridays.  Recheck in 4 weeks.

## 2010-12-24 NOTE — Medication Information (Signed)
Summary: rov/eac  Anticoagulant Therapy  Managed by: Cloyde Reams, RN, BSN Referring MD: Sherryl Manges MD PCP: Dondra Spry DO Supervising MD: Eden Emms MD, Theron Arista Indication 1: Atrial Fibrillation (ICD-427.31) Lab Used: [facility] Site: [facility] INR POC 2.1 INR RANGE 2 - 3  Dietary changes: no    Health status changes: no    Bleeding/hemorrhagic complications: no    Recent/future hospitalizations: no    Any changes in medication regimen? no    Recent/future dental: no  Any missed doses?: no       Is patient compliant with meds? yes       Allergies (verified): No Known Drug Allergies  Anticoagulation Management History:      The patient is taking warfarin and comes in today for a routine follow up visit.  Positive risk factors for bleeding include an age of 55 years or older.  The bleeding index is 'intermediate risk'.  Positive CHADS2 values include History of CHF, History of HTN, and Age > 63 years old.  The start date was 10/26/1998.  Anticoagulation responsible provider: Eden Emms MD, Theron Arista.  INR POC: 2.1.  Cuvette Lot#: [id number].  Exp: 03/2011.    Anticoagulation Management Assessment/Plan:      The patient's current anticoagulation dose is Coumadin 5 mg  tabs: Take as directed by coumadin clinic..  The target INR is 2 - 3.  The next INR is due 03/12/2010.  Anticoagulation instructions were given to patient.  Results were reviewed/authorized by Cloyde Reams, RN, BSN.  He was notified by Cloyde Reams RN.         Prior Anticoagulation Instructions: INR 2.0  This week ONLY, take 1 tablet on Friday instead of 1/2 tablet.  Return to normal dosing schedule of 1 tablet daily, except for 1/2 tablet on Monday and Friday.  Return to clinic in 4 weeks.    Current Anticoagulation Instructions: INR 2.1  Continue on same dosage 1 tablet daily except 1/2 tablet on Mondays and Fridays.  Recheck in 4 weeks.

## 2010-12-24 NOTE — Medication Information (Signed)
Summary: ROV/JAJ  Anticoagulant Therapy  Managed by: Weston Brass, PharmD Referring MD: Sherryl Manges MD PCP: Dondra Spry DO Supervising MD: Antoine Poche MD, Fayrene Fearing Indication 1: Atrial Fibrillation (ICD-427.31) Lab Used: [facility] Site: [facility] INR POC 2.6 INR RANGE 2 - 3  Dietary changes: no    Health status changes: no    Bleeding/hemorrhagic complications: no    Recent/future hospitalizations: no    Any changes in medication regimen? no    Recent/future dental: no  Any missed doses?: no       Is patient compliant with meds? yes       Allergies: No Known Drug Allergies  Anticoagulation Management History:      The patient is taking warfarin and comes in today for a routine follow up visit.  Positive risk factors for bleeding include an age of 75 years or older.  The bleeding index is 'intermediate risk'.  Positive CHADS2 values include History of CHF, History of HTN, and Age > 51 years old.  The start date was 10/26/1998.  Anticoagulation responsible provider: Antoine Poche MD, Fayrene Fearing.  INR POC: 2.6.  Cuvette Lot#: [id number].  Exp: 09/2011.    Anticoagulation Management Assessment/Plan:      The patient's current anticoagulation dose is Coumadin 5 mg  tabs: Take as directed by coumadin clinic..  The target INR is 2 - 3.  The next INR is due 09/30/2010.  Anticoagulation instructions were given to patient.  Results were reviewed/authorized by Weston Brass, PharmD.  He was notified by Haynes Hoehn, PharmD Candidate.         Prior Anticoagulation Instructions: INR 2.2  Continue taking 1 tablet every day except take 1/2 tablet on Mondays. Re-check INR in 4 weeks.   Current Anticoagulation Instructions: INR 2.6  Continue Coumadin as scheduled:  1 tablet every day of the week, except 1/2 tablet on Monday.  Return to clinic in 4 weeks.

## 2010-12-24 NOTE — Progress Notes (Signed)
Summary: Bone Density Results  Phone Note Call from Patient Call back at Home Phone [phone]   Caller: Patient Reason for Call: Talk to Nurse Summary of Call: Pt is anxious to get results of Bone Density scan, pls call Initial call taken by: Lannette Donath,  September 19, 2010 8:55 AM  Follow-up for Phone Call        bone density is normal Follow-up by: D. Thomos Lemons DO,  September 19, 2010 1:12 PM  Additional Follow-up for Phone Call Additional follow up Details #1::        call place to patient at [phone], his wife stated patient was not available. She states he requested she be informed in his absence. Per HIPPA ok from paitent, she has been advised per Dr Artist Pais instructions Additional Follow-up by: Glendell Docker CMA,  September 19, 2010 1:15 PM

## 2010-12-24 NOTE — Medication Information (Signed)
Summary: rov/sp  Anticoagulant Therapy  Managed by: Weston Brass, PharmD Referring MD: Sherryl Manges MD PCP: Dondra Spry DO Supervising MD: Clifton James MD, Cristal Deer Indication 1: Atrial Fibrillation (ICD-427.31) Lab Used: [facility] Site: [facility] INR POC 1.7 INR RANGE 2 - 3  Dietary changes: no    Health status changes: no    Bleeding/hemorrhagic complications: no    Recent/future hospitalizations: no    Any changes in medication regimen? no    Recent/future dental: no  Any missed doses?: no       Is patient compliant with meds? yes      Comments: pt had already taken dose today so instructed to take an extra half tab when he got home  Allergies: No Known Drug Allergies  Anticoagulation Management History:      The patient is taking warfarin and comes in today for a routine follow up visit.  Positive risk factors for bleeding include an age of 75 years or older.  The bleeding index is 'intermediate risk'.  Positive CHADS2 values include History of CHF, History of HTN, and Age > 45 years old.  The start date was 10/26/1998.  Anticoagulation responsible provider: Clifton James MD, Cristal Deer.  INR POC: 1.7.  Cuvette Lot#: [id number].  Exp: 07/2011.    Anticoagulation Management Assessment/Plan:      The patient's current anticoagulation dose is Coumadin 5 mg  tabs: Take as directed by coumadin clinic..  The target INR is 2 - 3.  The next INR is due 07/02/2010.  Anticoagulation instructions were given to patient.  Results were reviewed/authorized by Weston Brass, PharmD.  He was notified by Alcus Dad B Pharm.         Prior Anticoagulation Instructions: INR 2.2  Continue same dose of 1 tablet every day except 1/2 tablet on Monday and Friday   Current Anticoagulation Instructions: INR 1.7  Take an extra half a tablet today , resume normal schedule , take one half a tablet Monday and Friday and take one tablet on all other days of the week. Return in 4 weeks.

## 2010-12-24 NOTE — Medication Information (Signed)
Summary: Eric [facility]  Anticoagulant Therapy  Managed by: Eda Keys, PharmD Referring MD: Sherryl Manges MD PCP: Dondra Spry DO Supervising MD: Excell Seltzer MD, Casimiro Needle Indication 1: Atrial Fibrillation (ICD-427.31) Lab Used: [facility] Site: [facility] INR POC 2.0 INR RANGE 2 - 3  Dietary changes: no    Health status changes: no    Bleeding/hemorrhagic complications: no    Recent/future hospitalizations: no    Any changes in medication regimen? no    Recent/future dental: no  Any missed doses?: no       Is patient compliant with meds? yes      Comments: Pt has a tendency to "tamper" with his dose and has taken an extra 5 mg this week to "compensate" for brocolli in diet.    Allergies: No Known Drug Allergies  Anticoagulation Management History:      The patient is taking warfarin and comes in today for a routine follow up visit.  Positive risk factors for bleeding include an age of 75 years or older.  The bleeding index is 'intermediate risk'.  Positive CHADS2 values include History of CHF, History of HTN, and Age > 75 years old.  The start date was 10/26/1998.  Anticoagulation responsible provider: Excell Seltzer MD, Casimiro Needle.  INR POC: 2.0.  Cuvette Lot#: [id number].  Exp: 02/2011.    Anticoagulation Management Assessment/Plan:      The patient's current anticoagulation dose is Coumadin 5 mg  tabs: Take as directed by coumadin clinic..  The target INR is 2 - 3.  The next INR is due 02/12/2010.  Anticoagulation instructions were given to patient.  Results were reviewed/authorized by Eda Keys, PharmD.  He was notified by Eda Keys.         Prior Anticoagulation Instructions: INR: 2.1 Continue with same dosage of 1 tablet daily except 1/2 tablet on Mondays and Fridays Recheck in 4 weeks  Current Anticoagulation Instructions: INR 2.0  This week ONLY, take 1 tablet on Friday instead of 1/2 tablet.  Return to normal dosing schedule of 1 tablet daily, except for 1/2 tablet  on Monday and Friday.  Return to clinic in 4 weeks.

## 2010-12-24 NOTE — Medication Information (Signed)
Summary: rov/tm  Anticoagulant Therapy  Managed by: Shelby Dubin, PharmD, BCPS, CPP Referring MD: Sherryl Manges MD PCP: Dondra Spry DO Supervising MD: Graciela Husbands MD, Viviann Spare Indication 1: Atrial Fibrillation (ICD-427.31) Lab Used: [facility] Site: [facility] INR POC 2.1 INR RANGE 2 - 3  Dietary changes: no    Health status changes: no    Bleeding/hemorrhagic complications: no    Recent/future hospitalizations: no    Any changes in medication regimen? no    Recent/future dental: no  Any missed doses?: no       Is patient compliant with meds? yes       Allergies (verified): No Known Drug Allergies  Anticoagulation Management History:      The patient is taking warfarin and comes in today for a routine follow up visit.  Positive risk factors for bleeding include an age of 75 years or older.  The bleeding index is 'intermediate risk'.  Positive CHADS2 values include History of CHF, History of HTN, and Age > 34 years old.  The start date was 10/26/1998.  Anticoagulation responsible provider: Graciela Husbands MD, Viviann Spare.  INR POC: 2.1.  Cuvette Lot#: [id number].  Exp: 02/2011.    Anticoagulation Management Assessment/Plan:      The patient's current anticoagulation dose is Coumadin 5 mg  tabs: Take as directed by coumadin clinic..  The target INR is 2 - 3.  The next INR is due 01/17/2010.  Anticoagulation instructions were given to patient.  Results were reviewed/authorized by Shelby Dubin, PharmD, BCPS, CPP.  He was notified by Ysidro Evert, Pharm D Candidate.         Prior Anticoagulation Instructions: The patient is to continue with the same dose of coumadin.  This dosage includes:   Current Anticoagulation Instructions: INR: 2.1 Continue with same dosage of 1 tablet daily except 1/2 tablet on Mondays and Fridays Recheck in 4 weeks

## 2010-12-24 NOTE — Assessment & Plan Note (Signed)
Summary: 1 YR F/U   Primary Provider:  Dondra Spry DO  CC:  1 year follow up.  Pt feeling well.  History of Present Illness: Eric Lambert is seen in followup for  presumably permanent atrial fibrillation and associated tachycardia induced cardiomyopathy. He is status post pacemaker implantation. He has no complaints of chest pain or change in exercise intolerance.he remains somewhat short of breath climbing hills. He denies edema lightheadedness or syncope.  echo cardiogram 2008 demonstrated normal left ventricular function.  [name] remained stable.  Current Medications (verified): 1)  Verapamil Hcl Cr 240 Mg  Cp24 (Verapamil Hcl) .... Take 1 Tablet By Mouth Once A Day 2)  Coumadin 5 Mg  Tabs (Warfarin Sodium) .... Take As Directed By Coumadin Clinic. 3)  Ramipril 5 Mg  Caps (Ramipril) .Marland Kitchen.. 1 By Mouth Bid 4)  Digoxin 0.25 Mg  Tabs (Digoxin) .Marland Kitchen.. 1po Once Daily 5)  Cialis 2.5 Mg  Tabs (Tadalafil) .... As Needed 6)  Vitamin D3 1000 Unit Tabs (Cholecalciferol) .... Take 1 Tablet By Mouth Once A Day 7)  Oscal 500/200 D-3 500-200 Mg-Unit Tabs (Calcium-Vitamin D) .... Take 1 Tablet By Mouth Two Times A Day 8)  Vision Formula  Tabs (Multiple Vitamins-Minerals) .... Take 1 Tablet By Mouth Once A Day 9)  Reclast 5 Mg/167ml Soln (Zoledronic Acid) .... Once Yearly  Last Infusion 12/0/09  Allergies (verified): No Known Drug Allergies  Past History:  Past Medical History: Last updated: 08/16/2009 hx of SBE - 1968 Atrial fibrillation Bradycardia Hypertension Complete heart block s/p pacer-[employer] COPD OSA GERD Congestive heart failure Allergic rhinitis Benign prostatic hypertrophy right hip djd Osteoporosis  glucose intolerance  Erectile dysfuction   Past Surgical History: Last updated: 08/16/2009 s/p pacemaker 1996  s/p right hip replacement 2008   Family History: Last updated: 08/16/2009 CAD colon polyps brother with DM multiple famlily with heart  disease Prostate ca - father    Social History: Last updated: 08/16/2009 Never Smoked Alcohol use-no Married 2 children (son in [place],  daughter in [place], [place]) Retired Charity fundraiser     Vital Signs:  Patient profile:   77 year old male Height:      70 inches Weight:      157 pounds BMI:     22.61 Pulse rate:   60 / minute Pulse rhythm:   regular BP sitting:   128 / 74  (left arm) Cuff size:   regular  Vitals Entered By: Eric Lambert CMA (February 19, 2010 3:20 PM)  Physical Exam  General:  The patient was alert and oriented in no acute distress. HEENT Normal.  Neck veins were flat, carotids were brisk.  Lungs were clear.  Heart sounds were regular without murmurs or gallops.  Abdomen was soft with active bowel sounds. There is no clubbing cyanosis or edema. Skin Warm and dry    PPM Specifications Following MD:  Eric Manges, MD     [facility] Vendor:  [facility] [employer]     PPM Model Number:  [id number]     PPM Serial Number:  [id number] PPM DOI:  12/20/2001     PPM Implanting MD:  Eric Manges, MD  Lead 1    Location: RA     DOI: 07/03/1995     Model #: [id number]     Serial #: [id number]     Status: active Lead 2    Location: RV     DOI: 07/03/1995     Model #: [id number]  Serial #: [id number]     Status: active  Magnet Response Rate:  BOL 100 ERI  85  Indications:  A-fib   PPM Follow Up Remote Check?  No Battery Voltage:  good V     Battery Est. Longevity:  3.5 years     Pacer Dependent:  No     Right Ventricle  Amplitude: 5.1 mV, Impedance: 890 ohms, Threshold: 1.0 V at 0.4 msec  Episodes Coumadin:  Yes Ventricular High Rate:  11      Parameters Mode:  SSIR     Lower Rate Limit:  60     Upper Rate Limit:  120 Next Cardiology Appt Due:  07/25/2010 Tech Comments:  No parameter changes.  TTM's with Vear Clock.  ROV 6 months. Eric Harm, LPN  February 19, 2010 3:43 PM   Impression & Recommendations:  Problem # 1:  CARDIAC PACEMAKER IN SITU (ICD-V45.01) Device parameters and data were reviewed  and no changes were made  Problem # 2:  ATRIAL FIBRILLATION (ICD-427.31) adequately rate controlled; will continue coumadin His updated medication list for this problem includes:    Coumadin 5 Mg Tabs (Warfarin sodium) .Marland Kitchen... Take as directed by coumadin clinic.    Digoxin 0.25 Mg Tabs (Digoxin) .Marland Kitchen... 1po once daily  Orders: EKG w/ Interpretation (93000)  Problem # 3:  CARDIOMYOPATHY, SECONDARY (ICD-425.9) resolved His updated medication list for this problem includes:    Verapamil Hcl Cr 240 Mg Cp24 (Verapamil hcl) .Marland Kitchen... Take 1 tablet by mouth once a day    Coumadin 5 Mg Tabs (Warfarin sodium) .Marland Kitchen... Take as directed by coumadin clinic.    Ramipril 5 Mg Caps (Ramipril) .Marland Kitchen... 1 by mouth bid    Digoxin 0.25 Mg Tabs (Digoxin) .Marland Kitchen... 1po once daily

## 2010-12-24 NOTE — Medication Information (Signed)
Summary: rov/kb  Anticoagulant Therapy  Managed by: Weston Brass, PharmD Referring MD: Sherryl Manges MD PCP: Dondra Spry DO Supervising MD: Daleen Squibb MD, Maisie Fus Indication 1: Atrial Fibrillation (ICD-427.31) Lab Used: [facility] Site: [facility] INR POC 1.9 INR RANGE 2 - 3  Dietary changes: no    Health status changes: no    Bleeding/hemorrhagic complications: no    Recent/future hospitalizations: no    Any changes in medication regimen? no    Recent/future dental: no  Any missed doses?: no       Is patient compliant with meds? yes      Comments: Pt continues to self-dose Coumadin.  Requested 3 week f/u.  Pt refused and made 4 week f/u.  He is aware of the risks.   Allergies: No Known Drug Allergies  Anticoagulation Management History:      The patient is taking warfarin and comes in today for a routine follow up visit.  Positive risk factors for bleeding include an age of 75 years or older.  The bleeding index is 'intermediate risk'.  Positive CHADS2 values include History of CHF, History of HTN, and Age > 35 years old.  The start date was 10/26/1998.  Anticoagulation responsible provider: Daleen Squibb MD, Maisie Fus.  INR POC: 1.9.  Cuvette Lot#: [id number].  Exp: 08/2011.    Anticoagulation Management Assessment/Plan:      The patient's current anticoagulation dose is Coumadin 5 mg  tabs: Take as directed by coumadin clinic..  The target INR is 2 - 3.  The next INR is due 07/02/2010.  Anticoagulation instructions were given to patient.  Results were reviewed/authorized by Weston Brass, PharmD.  He was notified by PharmD Candidate.         Prior Anticoagulation Instructions: INR 1.7  Take an extra half a tablet today , resume normal schedule , take one half a tablet Monday and Friday and take one tablet on all other days of the week. Return in 4 weeks.  Current Anticoagulation Instructions: INR 1.9  Take 1.5 tablets tomorrow, Aug. 10, then change schedule to 1 tablet daily except for  1/2 tablet on Mondays.  Return to clinic in 4 weeks.

## 2010-12-24 NOTE — Medication Information (Signed)
Summary: rov/sp  Anticoagulant Therapy  Managed by: Leota Sauers, PharmD, BCPS, CPP Referring MD: Sherryl Manges MD PCP: Dondra Spry DO Supervising MD: Eden Emms MD, Theron Arista Indication 1: Atrial Fibrillation (ICD-427.31) Lab Used: [facility] Site: [facility] INR POC 2.2 INR RANGE 2 - 3  Dietary changes: yes       Details: Salads, broccoli  Health status changes: no    Bleeding/hemorrhagic complications: no    Recent/future hospitalizations: no    Any changes in medication regimen? no    Recent/future dental: no  Any missed doses?: yes     Details: Pt ran out of warfarin and only took half of today's dose  Is patient compliant with meds? yes       Allergies: No Known Drug Allergies  Anticoagulation Management History:      The patient is taking warfarin and comes in today for a routine follow up visit.  Positive risk factors for bleeding include an age of 75 years or older.  The bleeding index is 'intermediate risk'.  Positive CHADS2 values include History of CHF, History of HTN, and Age > 78 years old.  The start date was 10/26/1998.  Anticoagulation responsible Tonjia Parillo: Eden Emms MD, Theron Arista.  INR POC: 2.2.  Cuvette Lot#: [id number].  Exp: 08/2011.    Anticoagulation Management Assessment/Plan:      The patient's current anticoagulation dose is Coumadin 5 mg  tabs: Take as directed by coumadin clinic..  The target INR is 2 - 3.  The next INR is due 08/27/2010.  Anticoagulation instructions were given to patient.  Results were reviewed/authorized by Leota Sauers, PharmD, BCPS, CPP.  He was notified by Harrel Carina, PharmD candidate.         Prior Anticoagulation Instructions: INR 1.9  Take 1.5 tablets tomorrow, Aug. 10, then change schedule to 1 tablet daily except for 1/2 tablet on Mondays.  Return to clinic in 4 weeks.  Current Anticoagulation Instructions: INR 2.2  Continue taking 1 tablet every day except take 1/2 tablet on Mondays. Re-check INR in 4 weeks.    Prescriptions: COUMADIN 5 MG  TABS (WARFARIN SODIUM) Take as directed by coumadin clinic.  #90 Tablet x 0   Entered by:   Cloyde Reams RN   Authorized by:   Nathen May, MD, [facility]   Signed by:   Cloyde Reams RN on 07/30/2010   Method used:   Electronically to        [facility] (retail)       [address]       [place], [place]  [address]       Ph: [phone] or [phone]       Fax: [phone]   RxID:   [id number]

## 2010-12-24 NOTE — Miscellaneous (Signed)
Summary: BONE DENSITY  Clinical Lists Changes  Orders: Added new Test order of T-Bone Densitometry (77080) - Signed Added new Test order of T-Lumbar Vertebral Assessment (77082) - Signed 

## 2010-12-24 NOTE — Letter (Signed)
   [facility] [address] [place], [place]  [address]  [place] Phone: [phone]      August 07, 2010   Delno Norgard [address] [place], [place] [address]  RE:  LAB RESULTS  Dear  Mr. DEHOYOS,  The following is an interpretation of your most recent lab tests.  Please take note of any instructions provided or changes to medications that have resulted from your lab work.  ELECTROLYTES:  Good - no changes needed  KIDNEY FUNCTION TESTS:  Good - no changes needed   THYROID STUDIES:  Thyroid studies normal TSH: 4.372      CBC:  Stable - no changes needed  Vitamin D level - 45  (normal)       Sincerely Yours,    Dr. Thomos Lemons  Appended Document:  Mailed.

## 2010-12-24 NOTE — Medication Information (Signed)
Summary: rov/cs  Anticoagulant Therapy  Managed by: Bethena Midget, RN, BSN Referring MD: Sherryl Manges MD PCP: Dondra Spry DO Supervising MD: Johney Frame MD, Fayrene Fearing Indication 1: Atrial Fibrillation (ICD-427.31) Lab Used: [facility] Site: [facility] INR POC 2.7 INR RANGE 2 - 3  Dietary changes: no    Health status changes: no    Bleeding/hemorrhagic complications: no    Recent/future hospitalizations: no    Any changes in medication regimen? no    Recent/future dental: no  Any missed doses?: no       Is patient compliant with meds? yes       Allergies: No Known Drug Allergies  Anticoagulation Management History:      The patient is taking warfarin and comes in today for a routine follow up visit.  Positive risk factors for bleeding include an age of 75 years or older.  The bleeding index is 'intermediate risk'.  Positive CHADS2 values include History of CHF, History of HTN, and Age > 75 years old.  The start date was 10/26/1998.  Anticoagulation responsible provider: Allred MD, Fayrene Fearing.  INR POC: 2.7.  Cuvette Lot#: [id number].  Exp: 09/2011.    Anticoagulation Management Assessment/Plan:      The patient's current anticoagulation dose is Coumadin 5 mg  tabs: Take as directed by coumadin clinic..  The target INR is 2 - 3.  The next INR is due 10/28/2010.  Anticoagulation instructions were given to patient.  Results were reviewed/authorized by Bethena Midget, RN, BSN.  He was notified by Bethena Midget, RN, BSN.         Prior Anticoagulation Instructions: INR 2.6  Continue Coumadin as scheduled:  1 tablet every day of the week, except 1/2 tablet on Monday.  Return to clinic in 4 weeks.    Current Anticoagulation Instructions: INR 2.7 Continue 5mg s daily except 2.5mg s on Mondays. Recheck in 4 weeks.

## 2010-12-24 NOTE — Medication Information (Signed)
Summary: rov/tm  Anticoagulant Therapy  Managed by: Bethena Midget, RN, BSN Referring MD: Sherryl Manges MD PCP: Dondra Spry DO Supervising MD: Tenny Craw MD, Gunnar Fusi Indication 1: Atrial Fibrillation (ICD-427.31) Lab Used: [facility] Site: [facility] INR POC 2.3 INR RANGE 2 - 3  Dietary changes: no    Health status changes: no    Bleeding/hemorrhagic complications: no    Recent/future hospitalizations: no    Any changes in medication regimen? no    Recent/future dental: no  Any missed doses?: no       Is patient compliant with meds? yes       Allergies: No Known Drug Allergies  Anticoagulation Management History:      The patient is taking warfarin and comes in today for a routine follow up visit.  Positive risk factors for bleeding include an age of 75 years or older.  The bleeding index is 'intermediate risk'.  Positive CHADS2 values include History of CHF, History of HTN, and Age > 74 years old.  The start date was 10/26/1998.  Anticoagulation responsible provider: Tenny Craw MD, Gunnar Fusi.  INR POC: 2.3.  Cuvette Lot#: [id number].  Exp: 10/2011.    Anticoagulation Management Assessment/Plan:      The patient's current anticoagulation dose is Coumadin 5 mg  tabs: Take as directed by coumadin clinic..  The target INR is 2 - 3.  The next INR is due 11/26/2010.  Anticoagulation instructions were given to patient.  Results were reviewed/authorized by Bethena Midget, RN, BSN.  He was notified by Bethena Midget, RN, BSN.         Prior Anticoagulation Instructions: INR 2.7 Continue 5mg s daily except 2.5mg s on Mondays. Recheck in 4 weeks.   Current Anticoagulation Instructions: INR 2.3 Continue 5mg  everyday except 2.5mg s on Mondays. Recheck in 4 weeks.

## 2010-12-24 NOTE — Progress Notes (Signed)
Summary: med question  Phone Note Call from Patient Call back at Home Phone [phone]   Caller: Patient Reason for Call: Talk to Nurse Summary of Call: pt is almost out of warfarin.... wants to know what to do is his meds dont get pre auth Initial call taken by: Migdalia Dk,  Apr 19, 2010 12:09 PM  Follow-up for Phone Call        I have notified pt that we have sent in his rx and he should be able to get it filled anytime now.   Follow-up by: Eda Keys,  Apr 19, 2010 1:41 PM

## 2010-12-24 NOTE — Cardiovascular Report (Signed)
Summary: TTM   TTM   Imported By: Roderic Ovens 09/10/2010 11:09:37  _____________________________________________________________________  External Attachment:    Type:   Image     Comment:   External Document

## 2010-12-26 NOTE — Medication Information (Signed)
Summary: rov/tm  Anticoagulant Therapy  Managed by: Cloyde Reams, RN, BSN Referring MD: Sherryl Manges MD PCP: Dondra Spry DO Supervising MD: Jens Som MD, Arlys John Indication 1: Atrial Fibrillation (ICD-427.31) Lab Used: [facility] Site: [facility] INR POC 4.0 INR RANGE 2 - 3  Dietary changes: yes       Details: Incr intake of Broccolli and also incr Coumadin along with  Health status changes: yes       Details: Onset of cough and congestion x 2 weeks ago.  Had ear ache, took Amoxicillin dental pre-med  Bleeding/hemorrhagic complications: no    Recent/future hospitalizations: no    Any changes in medication regimen? no    Recent/future dental: no  Any missed doses?: no       Is patient compliant with meds? yes       Allergies: No Known Drug Allergies  Anticoagulation Management History:      The patient is taking warfarin and comes in today for a routine follow up visit.  Positive risk factors for bleeding include an age of 34 years or older.  The bleeding index is 'intermediate risk'.  Positive CHADS2 values include History of CHF, History of HTN, and Age > 56 years old.  The start date was 10/26/1998.  Anticoagulation responsible provider: Jens Som MD, Arlys John.  INR POC: 4.0.  Cuvette Lot#: [id number].  Exp: 12/2011.    Anticoagulation Management Assessment/Plan:      The patient's current anticoagulation dose is Coumadin 5 mg  tabs: Take as directed by coumadin clinic..  The target INR is 2 - 3.  The next INR is due 12/24/2010.  Anticoagulation instructions were given to patient.  Results were reviewed/authorized by Cloyde Reams, RN, BSN.  He was notified by Cloyde Reams RN.         Prior Anticoagulation Instructions: INR 2.3 Continue 5mg  everyday except 2.5mg s on Mondays. Recheck in 4 weeks.   Current Anticoagulation Instructions: INR 4.0  Skip tomorrow's dosage of Coumadin, then resume same dosage 1 tablet daily except 1/2 tablet on Mondays.  Recheck in 3 weeks.

## 2010-12-26 NOTE — Cardiovascular Report (Signed)
Summary: Transtelephonic Pacemaker Report  Transtelephonic Pacemaker Report   Imported By: Marylou Mccoy 12/17/2010 12:43:28  _____________________________________________________________________  External Attachment:    Type:   Image     Comment:   External Document

## 2011-01-01 NOTE — Medication Information (Signed)
Summary: rov/tp  Anticoagulant Therapy  Managed by: Geoffry Paradise, PharmD Referring MD: Sherryl Manges MD PCP: Dondra Spry DO Supervising MD: Myrtis Ser MD, Tinnie Gens Indication 1: Atrial Fibrillation (ICD-427.31) Lab Used: [facility] Site: [facility] INR POC 2.2 INR RANGE 2 - 3  Dietary changes: no    Health status changes: no    Bleeding/hemorrhagic complications: no    Recent/future hospitalizations: no    Any changes in medication regimen? no    Recent/future dental: no  Any missed doses?: no       Is patient compliant with meds? yes       Allergies: No Known Drug Allergies  Anticoagulation Management History:      The patient is taking warfarin and comes in today for a routine follow up visit.  Positive risk factors for bleeding include an age of 72 years or older.  The bleeding index is 'intermediate risk'.  Positive CHADS2 values include History of CHF, History of HTN, and Age > 88 years old.  The start date was 10/26/1998.  Anticoagulation responsible provider: Myrtis Ser MD, Tinnie Gens.  INR POC: 2.2.  Cuvette Lot#: [id number].  Exp: 11/2011.    Anticoagulation Management Assessment/Plan:      The patient's current anticoagulation dose is Coumadin 5 mg  tabs: Take as directed by coumadin clinic..  The target INR is 2 - 3.  The next INR is due 01/21/2011.  Anticoagulation instructions were given to patient.  Results were reviewed/authorized by Geoffry Paradise, PharmD.         Prior Anticoagulation Instructions: INR 4.0  Skip tomorrow's dosage of Coumadin, then resume same dosage 1 tablet daily except 1/2 tablet on Mondays.  Recheck in 3 weeks.    Current Anticoagulation Instructions: INR:  2.2 (goal 2-3)  Your INR is at goal.  Continue your Coumadin 5 mg everyday EXCEPT 2.5 mg on Mondays.  Return in 4 weeks for another INR check.

## 2011-01-19 DIAGNOSIS — Z7901 Long term (current) use of anticoagulants: Secondary | ICD-10-CM | POA: Insufficient documentation

## 2011-01-19 DIAGNOSIS — I4891 Unspecified atrial fibrillation: Secondary | ICD-10-CM

## 2011-01-21 ENCOUNTER — Encounter (INDEPENDENT_AMBULATORY_CARE_PROVIDER_SITE_OTHER): Payer: Medicare Other

## 2011-01-21 ENCOUNTER — Encounter: Payer: Self-pay | Admitting: Cardiology

## 2011-01-21 DIAGNOSIS — I4891 Unspecified atrial fibrillation: Secondary | ICD-10-CM

## 2011-01-21 DIAGNOSIS — Z7901 Long term (current) use of anticoagulants: Secondary | ICD-10-CM

## 2011-01-21 LAB — CONVERTED CEMR LAB: POC INR: 2.4

## 2011-01-28 ENCOUNTER — Encounter: Payer: Self-pay | Admitting: Internal Medicine

## 2011-01-28 ENCOUNTER — Encounter (INDEPENDENT_AMBULATORY_CARE_PROVIDER_SITE_OTHER): Payer: Medicare Other | Admitting: Internal Medicine

## 2011-01-28 DIAGNOSIS — I428 Other cardiomyopathies: Secondary | ICD-10-CM

## 2011-01-28 DIAGNOSIS — Z950 Presence of cardiac pacemaker: Secondary | ICD-10-CM

## 2011-01-28 DIAGNOSIS — I4891 Unspecified atrial fibrillation: Secondary | ICD-10-CM

## 2011-01-28 DIAGNOSIS — I495 Sick sinus syndrome: Secondary | ICD-10-CM

## 2011-01-30 NOTE — Medication Information (Signed)
Summary: rov/ewj  Anticoagulant Therapy  Managed by: Weston Brass, PharmD Referring MD: Sherryl Manges MD PCP: Dondra Spry DO Supervising MD: Shirlee Latch MD, Kyrstal Monterrosa Indication 1: Atrial Fibrillation (ICD-427.31) Lab Used: [facility] Site: [facility] INR POC 2.4 INR RANGE 2 - 3  Dietary changes: no    Health status changes: no    Bleeding/hemorrhagic complications: no    Recent/future hospitalizations: no    Any changes in medication regimen? no    Recent/future dental: no  Any missed doses?: no       Is patient compliant with meds? yes      Comments: Patient reports taking an 1/2 tablet on 2/10 and 2/21 because he had a serving of greens.  Counseled patient that this is not a good idea and that if it starts becoming more frequent that it could become a serious problem.    Allergies: No Known Drug Allergies  Anticoagulation Management History:      The patient is taking warfarin and comes in today for a routine follow up visit.  Positive risk factors for bleeding include an age of 75 years or older.  The bleeding index is 'intermediate risk'.  Positive CHADS2 values include History of CHF, History of HTN, and Age > 56 years old.  The start date was 10/26/1998.  Anticoagulation responsible provider: Shirlee Latch MD, Nyimah Shadduck.  INR POC: 2.4.  Cuvette Lot#: [id number].  Exp: 11/2011.    Anticoagulation Management Assessment/Plan:      The patient's current anticoagulation dose is Coumadin 5 mg  tabs: Take as directed by coumadin clinic..  The target INR is 2 - 3.  The next INR is due 02/11/2011.  Anticoagulation instructions were given to patient.  Results were reviewed/authorized by Weston Brass, PharmD.  He was notified by Margot Chimes PharmD Candidate.         Prior Anticoagulation Instructions: INR:  2.2 (goal 2-3)  Your INR is at goal.  Continue your Coumadin 5 mg everyday EXCEPT 2.5 mg on Mondays.  Return in 4 weeks for another INR check.    Current Anticoagulation  Instructions: INR 2.4  Continue to take 1 tablet daily except on 1/2 tablet on Mondays.  Recheck INR in 4 weeks.

## 2011-02-04 NOTE — Assessment & Plan Note (Signed)
Summary: PACER CHECK...GDT PER AMBER/LG   Visit Type:  Follow-up Primary Provider:  Dondra Spry DO   History of Present Illness: Eric Lambert is seen in followup for  presumably permanent atrial fibrillation and associated tachycardia induced cardiomyopathy. He is status post pacemaker implantation. He has no complaints of chest pain or change in exercise intolerance.he remains somewhat short of breath climbing hills. He denies edema lightheadedness or syncope.  He is anticipating a one month cruise with his son and his wife to [place]  echo cardiogram 2008 demonstrated normal left ventricular function.  [name] remained stable.  Current Medications (verified): 1)  Verapamil Hcl Cr 240 Mg  Cp24 (Verapamil Hcl) .... Take 1 Tablet By Mouth Once A Day 2)  Coumadin 5 Mg  Tabs (Warfarin Sodium) .... Take As Directed By Coumadin Clinic. 3)  Ramipril 5 Mg  Caps (Ramipril) .Marland Kitchen.. 1 By Mouth Bid 4)  Digoxin 0.25 Mg  Tabs (Digoxin) .Marland Kitchen.. 1po Once Daily 5)  Cialis 2.5 Mg  Tabs (Tadalafil) .... As Needed 6)  Vitamin D3 1000 Unit Tabs (Cholecalciferol) .... Take 1 Tablet By Mouth Once A Day 7)  Oscal 500/200 D-3 500-200 Mg-Unit Tabs (Calcium-Vitamin D) .... Take 1 Tablet By Mouth Two Times A Day 8)  Vision Formula  Tabs (Multiple Vitamins-Minerals) .... Take 1 Tablet By Mouth Once A Day 9)  Reclast 5 Mg/198ml Soln (Zoledronic Acid) .... Once Yearly  Last Infusion 12/0/09  Allergies (verified): No Known Drug Allergies  Past History:  Past Medical History: Last updated: 08/05/2010 hx of SBE - 1968 Atrial fibrillation  Bradycardia Hypertension Complete heart block s/p pacer-[employer] COPD OSA GERD Congestive heart failure Allergic rhinitis Benign prostatic hypertrophy right hip djd Osteoporosis  glucose intolerance  Erectile dysfuction   Vital Signs:  Patient profile:   77 year old male Height:      70 inches Weight:      158 pounds BMI:     22.75 Pulse rate:    64 / minute BP sitting:   130 / 60  (left arm)  Vitals Entered By: Laurance Flatten CMA (January 28, 2011 3:27 PM)  Physical Exam  General:  The patient was alert and oriented in no acute distress. HEENT Normal.  Neck veins were flat, carotids were brisk.  Lungs were clear.  Heart sounds were regular without murmurs or gallops.  Abdomen was soft with active bowel sounds. There is no clubbing cyanosis or edema. Skin Warm and dry '   PPM Specifications Following MD:  Sherryl Manges, MD     [facility] Vendor:  [facility] [employer]     PPM Model Number:  [id number]     PPM Serial Number:  [id number] PPM DOI:  12/20/2001     PPM Implanting MD:  Sherryl Manges, MD  Lead 1    Location: RA     DOI: 07/03/1995     Model #: [id number]     Serial #: [id number]     Status: active Lead 2    Location: RV     DOI: 07/03/1995     Model #: [id number]     Serial #: [id number]     Status: active  Magnet Response Rate:  BOL 100 ERI  85  Indications:  A-fib   PPM Follow Up Pacer Dependent:  No      Episodes Coumadin:  Yes  Parameters Mode:  SSIR     Lower Rate Limit:  60     Upper Rate Limit:  120  Impression & Recommendations:  Problem # 1:  CARDIOMYOPATHY, SECONDARY (ICD-425.9) improved continue on his current medications normal left ventricular function allows verapamil His updated medication list for this problem includes:    Verapamil Hcl Cr 240 Mg Cp24 (Verapamil hcl) .Marland Kitchen... Take 1 tablet by mouth once a day    Coumadin 5 Mg Tabs (Warfarin sodium) .Marland Kitchen... Take as directed by coumadin clinic.    Ramipril 5 Mg Caps (Ramipril) .Marland Kitchen... 1 by mouth bid    Digoxin 0.25 Mg Tabs (Digoxin) .Marland Kitchen... 1po once daily  Problem # 2:  ATRIAL FIBRILLATION (ICD-427.31) permanent continuing current medication; long talk about alternative anticoagulation following the release of the next factor X inhibitor His updated medication list for this problem includes:    Coumadin 5 Mg Tabs (Warfarin sodium) .Marland Kitchen... Take as directed by coumadin clinic.    Digoxin 0.25  Mg Tabs (Digoxin) .Marland Kitchen... 1po once daily  Problem # 3:  CARDIAC PACEMAKER IN SITU (ICD-V45.01) Device parameters and data were reviewed and no changes were made  Patient Instructions: 1)  Your physician recommends that you continue on your current medications as directed. Please refer to the Current Medication list given to you today. 2)  Your physician wants you to follow-up in:  YEAR WITH DR Despina Hidden will receive a reminder letter in the mail two months in advance. If you don't receive a letter, please call our office to schedule the follow-up appointment. Prescriptions: DIGOXIN 0.25 MG  TABS (DIGOXIN) 1po once daily  #90 x 3   Entered by:   Scherrie Bateman, LPN   Authorized by:   Nathen May, MD, [facility]   Signed by:   Scherrie Bateman, LPN on 16/08/9603   Method used:   Electronically to        [facility] (retail)       [address]       [place], [place]  [address]       Ph: [phone] or [phone]       Fax: [phone]   RxID:   [id number] RAMIPRIL 5 MG  CAPS (RAMIPRIL) 1 by mouth bid  #180 x 3   Entered by:   Scherrie Bateman, LPN   Authorized by:   Nathen May, MD, [facility]   Signed by:   Scherrie Bateman, LPN on 40/34/7425   Method used:   Electronically to        [facility] (retail)       [address]       [place], [place]  [address]       Ph: [phone] or [phone]       Fax: [phone]   RxID:   [id number] VERAPAMIL HCL CR 240 MG  CP24 (VERAPAMIL HCL) Take 1 tablet by mouth once a day  #90 x 3   Entered by:   Scherrie Bateman, LPN   Authorized by:   Nathen May, MD, [facility]   Signed by:   Scherrie Bateman, LPN on 62/83/1517   Method used:   Electronically to        [facility] (retail)       [address]       [place], [place]  [address]       Ph: [phone] or [phone]       Fax: [phone]   RxID:   [id number]

## 2011-02-10 ENCOUNTER — Telehealth: Payer: Self-pay | Admitting: Internal Medicine

## 2011-02-11 ENCOUNTER — Ambulatory Visit (INDEPENDENT_AMBULATORY_CARE_PROVIDER_SITE_OTHER): Payer: Medicare Other | Admitting: *Deleted

## 2011-02-11 DIAGNOSIS — Z7901 Long term (current) use of anticoagulants: Secondary | ICD-10-CM

## 2011-02-11 DIAGNOSIS — I4891 Unspecified atrial fibrillation: Secondary | ICD-10-CM

## 2011-02-11 LAB — POCT INR: INR: 2.8

## 2011-02-11 NOTE — Cardiovascular Report (Signed)
Summary: Office Visit   Office Visit   Imported By: Roderic Ovens 02/03/2011 15:07:25  _____________________________________________________________________  External Attachment:    Type:   Image     Comment:   External Document

## 2011-02-11 NOTE — Patient Instructions (Signed)
INR 2.8 Continue 1 tablet (5 mg) daily, except take 1/2 tablet (2.5mg ) on Mondays. Recheck in 4 weeks.

## 2011-02-14 ENCOUNTER — Telehealth: Payer: Self-pay | Admitting: Internal Medicine

## 2011-02-14 NOTE — Telephone Encounter (Signed)
Called patient back-had CHF about 10years ago after having atrial fibrillation.  Not in CHF currently.

## 2011-02-20 NOTE — Progress Notes (Signed)
Summary: needs 90day supply  Phone Note Refill Request Message from:  Patient  Refills Requested: Medication #1:  RAMIPRIL 5 MG  CAPS 1 by mouth bid pt needs this to be a 90day supply  Initial call taken by: Omer Jack,  February 10, 2011 10:58 AM  Follow-up for Phone Call        RX sent on 01/28/11. Pt states it has been handled. Marrion Coy, CNA  February 10, 2011 12:03 PM  Follow-up by: Marrion Coy, CNA,  February 10, 2011 12:03 PM

## 2011-03-05 ENCOUNTER — Telehealth: Payer: Self-pay | Admitting: Internal Medicine

## 2011-03-05 NOTE — Telephone Encounter (Signed)
Called patient back-has a question as to whether he can take Dramamine with his other medications.  Going on a cruise and gets sea sick.  Advised him to ask his pharmacist, but do not feel he would have a problem with this medicine.

## 2011-03-05 NOTE — Telephone Encounter (Signed)
Pt has question re over the count meds for motion sickness.

## 2011-03-06 ENCOUNTER — Ambulatory Visit (INDEPENDENT_AMBULATORY_CARE_PROVIDER_SITE_OTHER): Payer: Medicare Other | Admitting: *Deleted

## 2011-03-06 DIAGNOSIS — Z7901 Long term (current) use of anticoagulants: Secondary | ICD-10-CM

## 2011-03-06 DIAGNOSIS — I4891 Unspecified atrial fibrillation: Secondary | ICD-10-CM

## 2011-03-06 LAB — POCT INR: INR: 2.4

## 2011-04-08 NOTE — Assessment & Plan Note (Signed)
 HEALTHCARE                         ELECTROPHYSIOLOGY OFFICE NOTE   NAME:Eric Lambert, Eric Lambert               MRN:          [id number]  DATE:08/08/2008                            DOB:          [birth year]    Eric Lambert was seen in follow-up for atrial fibrillation status post  pacemaker implantation and he has a healed tachycardia-induced  cardiomyopathy.  He has undergone intercurrent hip surgery and is doing  really very well.  He says there's nothing we could do to help him feel  better.   On examination, his blood pressure 118/71, the pulse of 62.  His lungs  were clear.  Heart sounds are regular.  Extremities are without edema.   I should note that his medications currently are verapamil 240,  Coumadin, Altace 5 b.i.d., Lanoxin 0.25.   Interrogation of Guidant discovery pulse generator demonstrates an R-  wave of 5.8 with impedance of 910, a threshold of 1.0 at 0.4.  He is 81%  ventricularly paced.   IMPRESSION:  1. Permanent atrial fibrillation with a now controlled ventricular      response.  2. Resolved tachycardia, induced cardiomyopathy.  3. Remote history of subacute bacterial endocarditis, the context of      mitral valve prolapse.   Eric Lambert is doing very well.  We will plan to see him again in 6  months' time.     Duke Salvia, MD, [facility]  Electronically Signed    SCK/MedQ  DD: 08/08/2008  DT: 08/09/2008  Job #: [id number]

## 2011-04-08 NOTE — Discharge Summary (Signed)
NAMEAUREN, Eric Lambert             ACCOUNT NO.:  [id number]   MEDICAL RECORD NO.:  [id number]          PATIENT TYPE:  INP   LOCATION:  5001                         FACILITY:  [facility]   PHYSICIAN:  Robert A. Thurston Hole, M.D. DATE OF BIRTH:  07-Jun-1929   DATE OF ADMISSION:  10/04/2007  DATE OF DISCHARGE:  10/06/2007                               DISCHARGE SUMMARY   ADMITTING DIAGNOSES:  1. End-stage degenerative joint disease right hip.  2. Cardiac arrhythmia with pacemaker implantation.  3. Atrial fibrillation.  4. History of congestive heart failure.  5. History of bacterial endocarditis.   DISCHARGE DIAGNOSES:  1. End-stage degenerative joint disease right hip status post total      hip replacement.  2. Chronic Coumadin use.  3. History of bacterial endocarditis.  4. History of a cardiac arrhythmia with current pacemaker      implantation.  5. History of congestive heart failure.  6. Hypertension.  7. Urinary retention.  8. Thrombocytopenia secondary to Lovenox.  9. Hyponatremia secondary to Lovenox.   HISTORY OF PRESENT ILLNESS:  The patient is a 77 year old white male  with a history of end-stage DJD of his right hip.  He has failed  conservative care including anti-inflammatories and articular hip  injections.  Risks, benefits and possible complications of a total hip  replacement were discussed with him and his wife.  He received medical  and cardiac clearance prior to surgery and is without question.   PROCEDURES IN-HOUSE:  On October 04, 2007, the patient underwent a  right total hip replacement using a posterior approach by Dr. Thurston Hole.  He tolerated the procedure well and was admitted postoperatively for  pain control, DVT prophylaxis and physical therapy.  Postoperative day  #1, hemoglobin was 11.1, INR was 1.3, platelet count was 105.  Sodium  was normal at 137.  His PCA was discontinued, his Foley was  discontinued, he was given milk of magnesia.  He had difficulty  with  urinary retention and had 250 mL with in-and-out catheterization.  He  has been encouraged to increase his fluid intake and Flomax was begun.  He ambulated 120 feet with physical therapy, 50% weightbearing.  Postoperative day #2, the patient was improved.  He ambulated 150 feet  with physical therapy.  He was able to void.  He tolerated two sessions  of physical therapy on postoperative day #2, walking 250 feet, 50%  weightbearing on the second session of physical therapy.  His Lovenox  was discontinued due to his hyponatremia and thrombocytopenia.  He was  discharged to home in stable condition, 50% weightbearing, on a regular  diet.   DISCHARGE MEDICATIONS:  1. Coumadin 7.5 mg tonight then 5 mg daily.  2. Robaxin 500 mg p.o. q.4-6h. p.r.n. muscle spasm.  3. OxyIR 5 mg one to two q.4-6h. p.r.n. pain.  4. Ramipril 5 mg p.o. b.i.d.  5. Verapamil 240 mg daily.  6. Lanoxin 250 mcg daily.  7. Calcium 600 mg plus D twice a day.  8. Ocuvite one tablet daily.   He is to follow up with Dr. Thurston Hole on October 18, 2007.  He is reminded  of his posterior total hip precautions.  He is 50% weightbearing.  He is  to have his dressing changed daily.  Call with increased redness,  increased swelling, increased pain, increased drainage or temperature  greater than 101.  No lifting for 4 weeks, no driving for 4 weeks.  Total hip precautions must be followed for 6 weeks.      Kirstin Shepperson, P.A.      Robert A. Thurston Hole, M.D.  Electronically Signed    KS/MEDQ  D:  11/11/2007  T:  11/11/2007  Job:  [id number]

## 2011-04-08 NOTE — Op Note (Signed)
NAMEBRINLEY, Eric Lambert             ACCOUNT NO.:  [id number]   MEDICAL RECORD NO.:  [id number]          PATIENT TYPE:  INP   LOCATION:  2550                         FACILITY:  [facility]   PHYSICIAN:  Robert A. Thurston Hole, M.D. DATE OF BIRTH:  1929/10/01   DATE OF PROCEDURE:  10/04/2007  DATE OF DISCHARGE:                               OPERATIVE REPORT   PREOPERATIVE DIAGNOSIS:  Right hip degenerative joint disease (DJD).   POSTOPERATIVE DIAGNOSIS:  Right hip degenerative joint disease (DJD).   PROCEDURE:  Right total hip replacement using DePuy S-ROM press-fit  total hip system with acetabular component, 56 mm ASR press-fit  acetabular cup with femoral component 18 x 13 femoral stem with 36, +8  femoral neck with +0 x 49 mm ASR head with 18 FXXL sleeve.   SURGEON:  Elana Alm. Thurston Hole, M.D.   ASSISTANT:  Julien Girt, P.A.   ANESTHESIA:  General.   OPERATIVE TIME:  1 hour 10 minutes.   ESTIMATED BLOOD LOSS:  250 mL.   COMPLICATIONS:  None.   DESCRIPTION OF PROCEDURE:  Eric Lambert was brought in to the operating  room on October 04, 2007, placed on the operating room table in the  supine position.  He received Ancef 1 g IV preoperatively for  prophylaxis after being placed under general anesthesia and a Foley  catheter placed under sterile conditions.  His right hip was examined.  He had flexion to 90, extension to 0, internal and external rotation of  10 degrees with minimal leg length discrepancy.  He was then turned in  the right left decubitus position and secured on the bed with a Mark  frame.  His right hip and leg were then prepped and draped using sterile  technique.  Originally through a 15-cm posterolateral greater  trochanteric incision, initial exposure was made.  The underlying  subcutaneous tissues were incised in line with the skin incision.  The  iliotibial band and gluteus fascia was incised longitudinally, revealing  the underlying sciatic nerve, which was  carefully protected.  The short  external rotators of the hip and hip capsule were released off the  femoral neck insertion and tagged, and then the hip was posteriorly  dislocated.  Femoral head was found to have significant grade 3 and 4  DJD.  The femoral neck cut was then made 2 cm above the lesser  trochanter and the femoral head was removed.  The acetabulum was then  carefully exposed with retractors.  He was found to have grade 3 and 4  DJD noted in the acetabulum, as well.  Degenerative acetabular labrum  was removed.  Sequential acetabular reaming and the appropriate amount  of anteversion, abduction and inclination was then carried out up to a  55-mm size and then a 56-mm trial acetabular cup was hammered into  position with an excellent fit and the appropriate amount of  anteversion, abduction and inclination.  It was then removed and the  actual 56-mm ASR acetabular shell was hammered into position, also with  an excellent press-fit and the appropriate amount of anteversion,  abduction and inclination.  At  this point the proximal femur was  exposed.  Sequential femoral canal reaming was carried out to a 13.5 and  proximally to an 18.  The calcar reaming for the sleeve was then carried  out to a size FXXL.  After this was done, then an 18 FXXL  sleeve trial  was placed and then an 18 x 13 femoral stem with a 36, +8 femoral neck  was hammered into position in the appropriate amount of anteversion and  then a  +0 x 49 mm trial head was placed.  The hip was then reduced,  taken through a full range of motion and found to be stable up to 80  degrees of internal rotation in both neutral and 30 degrees of  adduction, and also stable in abduction external rotation, and leg  lengths were found to be equalized.  At this point, it was felt that the  trial components on the femur were of excellent size, fit and stability.  They were then removed.  Femoral canal was irrigated with saline  and  then the 18 FXXL sleeve was hammered into position with an excellent fit  and then the 18 x 13 femoral stem with a 36, +8 femoral neck was  hammered into position in the appropriate amount of anteversion and then  a +0 x 49 mm ASR head was placed and hammered onto the femoral neck with  a perfect Morse taper fit.  The hip was then reduced, taken through a  full range of motion, found to be stable, leg lengths equal.  At this  point it was felt that all the components were of excellent size, fit  and stability.  The wound was further irrigated.  Short external  rotators of the hip and hip capsule were then reattached to the femoral  neck through 2 drill holes and the greater trochanter.  The iliotibial  band and gluteus maximus fascia was closed with #1 Ethibond suture.  Subcutaneous tissues were closed with #0 Vicryl and 2-0 Vicryl,  subcuticular layer closed with 4-0 Monocryl.  Sterile dressings were  applied.  The patient turned supine, checked for leg lengths that were  equal.  Rotation was equal.  Pulses 2+ and symmetric.  He then had a  knee immobilizer placed.  He was then awakened, extubated and taken to  the recovery room in stable condition.  Needle and sponge counts were  correct x2 at the end of the case.      Robert A. Thurston Hole, M.D.  Electronically Signed     RAW/MEDQ  D:  10/04/2007  T:  10/04/2007  Job:  [id number]

## 2011-04-08 NOTE — Letter (Signed)
August 18, 2007    Robert A. Thurston Hole, M.D.  [address]  [address]  [place] [address]   RE:  ROLLYN, SCIALDONE  MRN:  [id number]  /  DOB:  April 27, 1929   Dear Reita Cliche:   I hope this letter finds you and your family well.  My foot is doing  much better; thank you.   Mr. Rundle, as you know, is considering sleep surgery.  He has been  diagnosed with sleep apnea.  He has a history of atrial fibrillation  that is permanent with bradycardia and he is status post pacemaker for  the above.   He has near-normal left ventricular function and nonischemic stress  echocardiogram done in March 2008.   He is tolerating his medications well.  They include verapamil 240,  Coumadin, Altace, and Lanoxin 0.25.  On examination, his blood pressure  is 104/54, the pulse is 57.  His lungs were clear, his neck veins were  flat, his carotids were brisk and full bilaterally without bruits.  The  heart sounds were regular without murmurs.  The extremities were without  edema.  The abdomen was soft with active bowel sounds.   Interrogation of his Guidant pulse generator demonstrates an R wave of  7.4 with impedance of 910; a threshold of 0.9 volts at 0.4 milliseconds.   IMPRESSION:  1. Atrial fibrillation - permanent.  2. Bradycardia status post pacemaker.  3. Normal left ventricular function with a nonischemic stress      echocardiogram in the spring of this year.   Mr. Tylen, [place], should be at very acceptable risk for his hip  surgery.   He is not device dependent but he does use his device, particularly at  low rates, so this may occur with surgery.  Please let us know how it is  that we can help in the interim.   We will plan to see him again in 1 year's time as previously scheduled.    Sincerely,      Duke Salvia, MD, [facility]  Electronically Signed    SCK/MedQ  DD: 08/18/2007  DT: 08/18/2007  Job #: [id number]

## 2011-04-09 ENCOUNTER — Ambulatory Visit (INDEPENDENT_AMBULATORY_CARE_PROVIDER_SITE_OTHER): Payer: Medicare Other | Admitting: *Deleted

## 2011-04-09 DIAGNOSIS — I4891 Unspecified atrial fibrillation: Secondary | ICD-10-CM

## 2011-04-09 DIAGNOSIS — Z7901 Long term (current) use of anticoagulants: Secondary | ICD-10-CM

## 2011-04-09 LAB — POCT INR: INR: 2.6

## 2011-04-11 NOTE — Op Note (Signed)
   [name], [name]                       ACCOUNT NO.:  [id number]   MEDICAL RECORD NO.:  [id number]                   PATIENT TYPE:  OUT   LOCATION:  VASC                                 FACILITY:  [facility]   PHYSICIAN:  Sigmund I. Patsi Sears, M.D.         DATE OF BIRTH:  1929/04/16   DATE OF PROCEDURE:  01/17/2003  DATE OF DISCHARGE:  12/23/2002                                 OPERATIVE REPORT   PREOPERATIVE DIAGNOSIS:  Erectile dysfunction.   POSTOPERATIVE DIAGNOSIS:  Erectile dysfunction.   PROCEDURE:  [facility] impotence testing.   SURGEON:  Sigmund I. Patsi Sears, M.D.   DESCRIPTION OF PROCEDURE:  With the patient in supine position, resting  cavernosal artery flow shows a peak systolic flow of 10.2 cc/sec. with an  end-diastolic flow of 0 on the right; and on the left, a peak systolic flow  of 12 cc/sec. and an end-diastolic flow of less than 1 cc/sec.   Following the injection of 1 mL of prostaglandin 20 mcg/mL, the patient  develops a peak systolic flow of 77 cc/sec. after five minutes, with an end-  diastolic flow of 11 cc/sec. on the right side; and on the left side, he has  a peak systolic flow of 79 cc/sec. with an end-diastolic flow of 11 cc/sec.   While the patient has reasonable arterial peak flow, he does not ever dampen  his end-diastolic flow, leading to a diagnosis of venous insufficiency.  The  patient may be best treated with ED medications and venous flow controller.                                               Sigmund I. Patsi Sears, M.D.    SIT/MEDQ  D:  01/17/2003  T:  01/17/2003  Job:  [id number]   cc:   [facility]

## 2011-04-11 NOTE — Procedures (Signed)
[place]  Patient:    Eric Lambert, OUCH Visit Number: [id number] MRN: [id number]          Service Type: CAT Location: [facility] 2899 20 Attending Physician:  Nathen May Dictated by:   Nathen May, M.D., [facility] Proc. Date: 12/20/01 Admit Date:  12/20/2001 Discharge Date: 12/20/2001   CC:         Electrophysiology Laboratory  [place], [facility]   Procedure Report  PREOPERATIVE DIAGNOSIS:  Atrial fibrillation, _____, with bradycardia.  POSTOPERATIVE DIAGNOSIS:  Atrial fibrillation, _____, with bradycardia.  PROCEDURE:  Explantation of a previously-implanted generator and implantation of a new single-chamber pacemaker.  DESCRIPTION OF PROCEDURE:  Following the obtaining of informed consent, the patient was brought to the cardiac catheterization laboratory and placed on the fluoroscopic table in supine position.  After routine prep and drape of the left upper chest, lidocaine was infiltrated along the line of the previous incision and carried down to the layer of the pacemaker pocket with sharp dissection.  The pocket was opened, and the previously-implanted pacemaker was removed.  It was noted that the atrial lead was a Medtronic 4058 and the ventricular lead was a Guidant 4285.  This lead was assessed with an R-wave of 7.8 millivolts with a pacing threshold of 0.9 volts at 0.5 msec, currented threshold was 1.1 MA, and the ventricular lead impedance was 742 Ohms.  It was noted also that the atrial and ventricular leads were coiled around each other a total of three times.  The atrial lead was capped and the lead was then pulled through this loose intertwining.  The pocket was copiously irrigated with antibiotic-containing saline solution.  Hemostasis was obtained.  The atrial lead was then placed in the pocket.  The ventricular lead was then attached to a Guidant model 1186 Discovery SRII pulse generator,  serial number [id number].  The lead and the pulse generator were then placed in the pocket. Care was utilized to allow for a flowing of the lead.  The pacemaker header, however, was now pointed medially to posterolaterally.  The pocket was then closed in three layers in the normal fashion.  The wound was washed, dried, and a benzoin and Steri-Strip dressing was then applied. Needle counts, sponge counts, and instrument counts were correct at the end of the procedure according to the staff.  The patient tolerated the procedure well. Dictated by:   Nathen May, M.D., [facility] Attending Physician:  Nathen May DD:  12/20/01 TD:  12/21/01 Job: [id number]

## 2011-04-11 NOTE — Assessment & Plan Note (Signed)
[facility]                         GASTROENTEROLOGY OFFICE NOTE   NAME:Eric Lambert                    MRN:          [id number]  DATE:10/16/2006                            DOB:          [birth year]    Eric Lambert comes in on October 16, 2006.  He has been having a great deal  of hiccups and temporary food obstruction, swallowing trouble for over  10 years but it is getting worse.  It feels like a lump of food not  going down.  He does swallow liquids satisfactorily.  He also starts  hiccupping after he eats.  He denies any real significant heartburn he  is aware of.  After talking to him at more length, I think he did seem  to recognize there is some at night and some bitter taste at night.  He  wakes up at night sometimes with sour brash.  He says no specific food  seem to bother him.  He did have a colonoscopic examination in 2003.   PAST MEDICAL HISTORY:  Reveals he has:  1. Some hypertension.  2. Problems with arrhythmias.  3. Some emphysema.  4. Some sleep apnea but otherwise does pretty well.  5. He has had some arthritis.   SOCIAL HISTORY:  Noncontributory.   He is a [employer] fundraiser, retired.   FAMILY HISTORY:  Reveals a brother and father with prostate cancer.  He  sees Sigmund I. Eric Sears, M.D.   REVIEW OF SYSTEMS:  Noncontributory.   PHYSICAL EXAMINATION:  GENERAL:  A healthy-appearing gentleman who is 5  feet 10 inches, weight is 157, blood pressure 114/66, pulse 68 and  regular.  HEENT: EOMI. PERRLA. Sclerae are anicteric.  Conjunctivae are pink.  NECK:  Supple without thyromegaly, adenopathy or carotid bruits.  CHEST:  Clear to auscultation and percussion without adventitious  sounds.  CARDIAC:  Regular rhythm; normal S1 S2.  There are no murmurs, gallops  or rubs.  ABDOMEN:  Bowel sounds are normoactive.  Abdomen is soft, non-tender and  non-distended.  There are no abdominal masses, tenderness, splenic  enlargement or  hepatomegaly.  EXTREMITIES:  Full range of motion.  No cyanosis, clubbing or edema.  RECTAL:  There are no masses.  Stool is Hemoccult negative.   IMPRESSION:  1. Gastroesophageal reflux disease with some dysphagia, probably      related to Schatzki ring and/or stricture secondary to reflux.  2. Hypertension.  3. Arrhythmia.  4. Emphysema.  5. Arthritis.  6. Sleep apnea.   RECOMMENDATION:  1. Trial of Prevacid 1 q.a.m.  2. Barium swallow with a tablet and schedule him for an EGD with      dilatation as per patient.  He was not anxious to have this done      unless absolutely necessary.  I had a long discussion with him      about that and I told him it was certainly up to him.  He did ask      an interesting question what would happen if he did not do it?  I      told him he could possibly get  better with the drugs to some      degree.  I think a lot would depend upon our findings on x-rays as      well as whether he needs any further therapy or not and then I      think he will just have to depend on how he does.     Ulyess Mort, MD  Electronically Signed    SML/MedQ  DD: 10/16/2006  DT: 10/16/2006  Job #: [id number]

## 2011-04-11 NOTE — Assessment & Plan Note (Signed)
[facility]                         ELECTROPHYSIOLOGY OFFICE NOTE   NAME:Eric Lambert                    MRN:          [id number]  DATE:01/15/2007                            DOB:          [birth year]    Mr. Eric Lambert comes in.  He has atrial fibrillation that is permanent.  He  is doing quite well.  He does note, however, new-onset dyspnea on  exertion.  Review of his chart demonstrates that our last assessment of  ejection fraction and/or ischemia was in 2002.  We have discussed his  son, who is doing okay.   His medications currently include:  1. Verapamil 240.  2. Digitek 0.25.  3. Coumadin.  4. Altace 5 b.i.d.   EXAMINATION:  His blood pressure is 134/76, pulse is 84.  Lungs were  clear.  Heart sounds were irregular and the extremities were without  edema.   Interrogation of his Guidant Discovery [id number] demonstrates an R-wave of  6.7, impedance of 890, a threshold of 1.1 volts at 0.4 milliseconds.  Heart rate excursion appeared to be adequate.  There was one episode of  nonsustained ventricular tachycardia.   IMPRESSION:  1. Atrial fibrillation - permanent.  2. Bradycardia.  3. Status post pacer for the above.  4. Obstructive sleep apnea.  5. New-onset exercise intolerance.   We will plan to get a stress echo.  This will allow [place] to assess both  his left ventricular function, as well as his heart rate response to  exercise.   We will talk with him after that.  We will plan to see him again in one  year's time, unless the aforementioned results dictate a change.     Duke Salvia, MD, [facility]  Electronically Signed    SCK/MedQ  DD: 01/15/2007  DT: 01/15/2007  Job #: [id number]

## 2011-04-11 NOTE — Procedures (Signed)
NAMEEISA, NECAISE             ACCOUNT NO.:  [id number]   MEDICAL RECORD NO.:  [id number]          PATIENT TYPE:  OUT   LOCATION:  SLEEP CENTER                 FACILITY:  [facility]   PHYSICIAN:  Clinton D. Maple Hudson, M.D. DATE OF BIRTH:  1929-03-16   DATE OF STUDY:                              NOCTURNAL POLYSOMNOGRAM   REFERRING PHYSICIAN:  Dr. Dillard Cannon.   DATE OF STUDY:  October 08, 2005.   INDICATION FOR STUDY:  Hypersomnia with sleep apnea.   EPWORTH SLEEPINESS SCORE:  5/24.   BMI:  21.   WEIGHT:  150 pounds.   A baseline diagnostic NPSG on August 18, 2005 had reported an AHI of 99.5  per hour. C-PAP titration is requested.   SLEEP ARCHITECTURE:  Total sleep time 336 minutes with sleep efficiency 79%.  Stage I 22%, stage II 71%, stages III and IV absent, REM 8% of total sleep  time. Sleep latency 6 minutes, REM latency 302 minutes, awake after sleep  onset 72 minutes, arousal index markedly increased at 122.3. No bedtime  medication was reported.   RESPIRATORY DATA:  C-PAP titration protocol. C-PAP was titrated to 16 CWP,  AHI 0 per hour. A medium ResMed ultra mirage nasal/oral mask was used with  heated humidifier. The patient was somewhat uncomfortable during first  exposure to C-PAP.   OXYGEN DATA:  Snoring was prevented at final C-PAP pressure and oxygen held  at 95-96% on room air.   CARDIAC DATA:  Paced rhythm at 60 beats per minute.   MOVEMENT/PARASOMNIA:  A total of 565 limb jerks were reported which 364 were  associated with arousal or awakening for periodic limb movement with arousal  index of 64.9 per hour which is markedly increased.   IMPRESSION/RECOMMENDATIONS:  1.  Successful C-PAP titration to 16 CWP, AHI 0 per hour with normal      oxygenation. A medium ResMed ultra mirage nasal/oral mask was used with      heated humidifier.  2.  Baseline diagnostic NPSG on August 18, 2005 had reported an AHI of      99.5 per hour.  3.  Periodic limb  movement with arousal, 64.9 per hour. In association with      frequent sleep fragmentation, consideration should be given to directing      therapy as a periodic limb movement. Clonazepam might be an      appropriate initial choice as it would provide sedation to assist with C-      PAP adjustment but alternatives should be considered clinically.      Clinton D. Maple Hudson, M.D.  Diplomate, Biomedical engineer of Sleep Medicine  Electronically Signed     CDY/MEDQ  D:  10/12/2005 12:19:13  T:  10/13/2005 00:14:18  Job:  [id number]

## 2011-04-11 NOTE — Procedures (Signed)
NAMEJAWARA, Eric Lambert             ACCOUNT NO.:  [id number]   MEDICAL RECORD NO.:  [id number]          PATIENT TYPE:  OUT   LOCATION:  SLEEP CENTER                 FACILITY:  [facility]   PHYSICIAN:  Clinton D. Maple Hudson, M.D. DATE OF BIRTH:  [birth year]   DATE OF STUDY:  08/18/2005                              NOCTURNAL POLYSOMNOGRAM   REFERRING PHYSICIAN:  Dr. Dillard Cannon.   DATE OF STUDY:  August 18, 2005.   INDICATION FOR STUDY:  Hypersomnia with sleep apnea. Epworth sleepiness  score 5/24, BMI 21. Weight 150 pounds.   SLEEP ARCHITECTURE:  Total sleep time 298 minutes with sleep efficiency 74%.  Stage I was 16%, stage II 50%, stages III and IV 23%, REM 11% total sleep  time. Sleep latency 23 minutes, REM latency 213 minutes, awake after sleep  onset 83 minutes, arousal index increased at 105. Sustained sleep onset at  midnight with intervals of sustained wakefulness and frequent brief  awakenings later in the night. No bedtime medication taken.   RESPIRATORY DATA:  Apnea/hypopnea index (AHI, RDI) 99.5 obstructive events  per hour indicating severe obstructive sleep apnea/hypopnea syndrome. There  were 9 central apneas, 152 obstructive apneas, 23 mixed apneas and 310  hypopneas. Events were noted mainly while sleeping supine and on right side.  REM AHI 26 per hour. Because of delayed sleep onset, technician was unable  to utilize C-PAP titration by split protocol.   OXYGEN DATA:  Moderate snoring with oxygen desaturation to a nadir of 89%.  Mean oxygen saturation through the study was 95% on room air.   CARDIAC DATA:  Atrial fibrillation with intermittently paced rhythm at 62  beats per minute.   MOVEMENT/PARASOMNIA:  A total of 33 limb jerks were reported of which 13  were associated with arousal or awakening for a periodic limb movement with  arousal index of 2.6 per hour which has been increased.   IMPRESSION/RECOMMENDATIONS:  1.  Severe obstructive sleep apnea/hypopnea  syndrome, AHI 99.5 per hour with      moderate snoring and oxygen desaturation to 89%.  2.  Consider return for C-PAP titration or evaluate for alternative      therapies as appropriate.  3.  Atrial fibrillation with intermittently paced rhythm at 62 beats per      minute.  4.  Mild periodic limb movement with arousal, 2.6 per hour.      Clinton D. Maple Hudson, M.D.  Diplomate, Biomedical engineer of Sleep Medicine  Electronically Signed     CDY/MEDQ  D:  08/24/2005 12:31:02  T:  08/24/2005 23:16:12  Job:  [id number]

## 2011-04-21 ENCOUNTER — Other Ambulatory Visit: Payer: Self-pay | Admitting: Internal Medicine

## 2011-05-01 ENCOUNTER — Encounter: Payer: Self-pay | Admitting: Internal Medicine

## 2011-05-01 DIAGNOSIS — I4891 Unspecified atrial fibrillation: Secondary | ICD-10-CM

## 2011-05-07 ENCOUNTER — Ambulatory Visit (INDEPENDENT_AMBULATORY_CARE_PROVIDER_SITE_OTHER): Payer: Medicare Other | Admitting: *Deleted

## 2011-05-07 DIAGNOSIS — Z7901 Long term (current) use of anticoagulants: Secondary | ICD-10-CM

## 2011-05-07 DIAGNOSIS — I4891 Unspecified atrial fibrillation: Secondary | ICD-10-CM

## 2011-06-04 ENCOUNTER — Ambulatory Visit (INDEPENDENT_AMBULATORY_CARE_PROVIDER_SITE_OTHER): Payer: Medicare Other | Admitting: *Deleted

## 2011-06-04 DIAGNOSIS — Z7901 Long term (current) use of anticoagulants: Secondary | ICD-10-CM

## 2011-06-04 DIAGNOSIS — I4891 Unspecified atrial fibrillation: Secondary | ICD-10-CM

## 2011-07-02 ENCOUNTER — Ambulatory Visit (INDEPENDENT_AMBULATORY_CARE_PROVIDER_SITE_OTHER): Payer: Medicare Other | Admitting: *Deleted

## 2011-07-02 DIAGNOSIS — Z7901 Long term (current) use of anticoagulants: Secondary | ICD-10-CM

## 2011-07-02 DIAGNOSIS — I4891 Unspecified atrial fibrillation: Secondary | ICD-10-CM

## 2011-07-08 ENCOUNTER — Telehealth: Payer: Self-pay

## 2011-07-08 NOTE — Telephone Encounter (Signed)
I would recommend that he discuss this question with his Urologist Dr. Patsi Sears.

## 2011-07-08 NOTE — Telephone Encounter (Signed)
Patient left a message stating that he would like to know at what frequency should a PSA test be generated for an 77 yr old male with BPH? Please advise?

## 2011-07-09 NOTE — Telephone Encounter (Signed)
Patient returned phone call and stated that he had spoken to his urologist and the matter has been taken care of.

## 2011-07-09 NOTE — Telephone Encounter (Signed)
Left a message for patient to return my call. 

## 2011-07-29 ENCOUNTER — Other Ambulatory Visit (INDEPENDENT_AMBULATORY_CARE_PROVIDER_SITE_OTHER): Payer: Medicare Other

## 2011-07-29 DIAGNOSIS — M810 Age-related osteoporosis without current pathological fracture: Secondary | ICD-10-CM

## 2011-07-29 DIAGNOSIS — I2510 Atherosclerotic heart disease of native coronary artery without angina pectoris: Secondary | ICD-10-CM

## 2011-07-29 DIAGNOSIS — Z0000 Encounter for general adult medical examination without abnormal findings: Secondary | ICD-10-CM

## 2011-07-29 DIAGNOSIS — N400 Enlarged prostate without lower urinary tract symptoms: Secondary | ICD-10-CM

## 2011-07-29 DIAGNOSIS — I10 Essential (primary) hypertension: Secondary | ICD-10-CM

## 2011-07-29 DIAGNOSIS — T887XXA Unspecified adverse effect of drug or medicament, initial encounter: Secondary | ICD-10-CM

## 2011-07-29 LAB — POCT URINALYSIS DIPSTICK
Bilirubin, UA: NEGATIVE
Blood, UA: NEGATIVE
Glucose, UA: NEGATIVE
Ketones, UA: NEGATIVE
Nitrite, UA: NEGATIVE

## 2011-07-29 LAB — TSH: TSH: 2.99 u[IU]/mL (ref 0.35–5.50)

## 2011-07-29 LAB — CBC WITH DIFFERENTIAL/PLATELET
Basophils Absolute: 0 10*3/uL (ref 0.0–0.1)
Basophils Relative: 0.6 % (ref 0.0–3.0)
Eosinophils Absolute: 0.1 10*3/uL (ref 0.0–0.7)
Hemoglobin: 15.1 g/dL (ref 13.0–17.0)
Lymphocytes Relative: 27.6 % (ref 12.0–46.0)
MCHC: 33 g/dL (ref 30.0–36.0)
MCV: 94.4 fl (ref 78.0–100.0)
Monocytes Absolute: 0.5 10*3/uL (ref 0.1–1.0)
Neutro Abs: 2.8 10*3/uL (ref 1.4–7.7)
Neutrophils Relative %: 59.5 % (ref 43.0–77.0)
RDW: 14.2 % (ref 11.5–14.6)

## 2011-07-29 LAB — HEPATIC FUNCTION PANEL
ALT: 14 U/L (ref 0–53)
Albumin: 3.9 g/dL (ref 3.5–5.2)
Bilirubin, Direct: 0.2 mg/dL (ref 0.0–0.3)
Total Protein: 6.4 g/dL (ref 6.0–8.3)

## 2011-07-29 LAB — LIPID PANEL
Cholesterol: 163 mg/dL (ref 0–200)
Total CHOL/HDL Ratio: 2
Triglycerides: 39 mg/dL (ref 0.0–149.0)

## 2011-07-29 LAB — BASIC METABOLIC PANEL
CO2: 32 mEq/L (ref 19–32)
Calcium: 9 mg/dL (ref 8.4–10.5)
Chloride: 100 mEq/L (ref 96–112)
Creatinine, Ser: 1 mg/dL (ref 0.4–1.5)
Glucose, Bld: 91 mg/dL (ref 70–99)

## 2011-07-30 ENCOUNTER — Ambulatory Visit (INDEPENDENT_AMBULATORY_CARE_PROVIDER_SITE_OTHER): Payer: Medicare Other | Admitting: *Deleted

## 2011-07-30 DIAGNOSIS — I4891 Unspecified atrial fibrillation: Secondary | ICD-10-CM

## 2011-07-30 DIAGNOSIS — Z7901 Long term (current) use of anticoagulants: Secondary | ICD-10-CM

## 2011-07-30 LAB — POCT INR: INR: 2.5

## 2011-07-31 ENCOUNTER — Encounter: Payer: Self-pay | Admitting: Internal Medicine

## 2011-07-31 DIAGNOSIS — I4891 Unspecified atrial fibrillation: Secondary | ICD-10-CM

## 2011-08-04 ENCOUNTER — Encounter: Payer: Self-pay | Admitting: Internal Medicine

## 2011-08-04 ENCOUNTER — Ambulatory Visit (INDEPENDENT_AMBULATORY_CARE_PROVIDER_SITE_OTHER): Payer: Medicare Other | Admitting: Internal Medicine

## 2011-08-04 DIAGNOSIS — D696 Thrombocytopenia, unspecified: Secondary | ICD-10-CM

## 2011-08-04 DIAGNOSIS — I10 Essential (primary) hypertension: Secondary | ICD-10-CM

## 2011-08-04 NOTE — Assessment & Plan Note (Signed)
Stable.  Continue current medication regimen. Lab Results  Component Value Date   CREATININE 1.0 07/29/2011   BP: 128/72 mmHg

## 2011-08-04 NOTE — Assessment & Plan Note (Signed)
Pt with mild thrombocytopenia.  No abnormal bruising.  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 Monitor for now. CBC in 6 months

## 2011-08-04 NOTE — Patient Instructions (Addendum)
Please complete the following lab tests in 6 months: CBCD - 287.5

## 2011-08-04 NOTE — Progress Notes (Signed)
  Subjective:    Patient ID: Eric Lambert, male    DOB: 1929/02/04, 77 y.o.   MRN: [id number]  HPI  77 y/o white male with hx of Afib, htn for routine follow up.  Overall he is doing well.  No abnormal bleeding.  No chest pain. No dizziness.  He traveled this summer to [place].  Some trouble with ankle pain with walking.  Reviewed lab results in detail.  He has mild thrombocytopenia.  No abnormal bruising.  Brother may has similar condition. Review of Systems See above  Past Medical History  Diagnosis Date  . 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 Atrial fibrillation   . Bradycardia   . Hypertension   . Complete heart block   . GERD (gastroesophageal reflux disease)   . COPD (chronic obstructive pulmonary disease)   . OSA (obstructive sleep apnea)   . CHF (congestive heart failure)   . Allergy   . BPH (benign prostatic hyperplasia)   . Osteoporosis   . GLUCOSE INTOLERANCE 10/22/2007  . ED (erectile dysfunction)     History   Social History  . Marital Status: Married    Spouse Name: N/A    Number of Children: N/A  . Years of Education: N/A   Occupational History  . Not on file.   Social History Main Topics  . Smoking status: Never Smoker   . Smokeless tobacco: Not on file  . Alcohol Use: No  . Drug Use:   . Sexually Active:    Other Topics Concern  . Not on file   Social History Narrative  . No narrative on file    Past Surgical History  Procedure Date  . Pacemaker placement   . Partial hip arthroplasty     2008    Family History  Problem Relation Age of Onset  . Heart disease    . Colon polyps    . Cancer Father     prostate  . Diabetes Brother     No Known Allergies  Current Outpatient Prescriptions on File Prior to Visit  Medication Sig Dispense Refill  . warfarin (COUMADIN) 5 MG tablet TAKE AS DIRECTED BY COUMADIN CLINIC.  90 tablet  1    BP 128/72  Pulse 72  Temp(Src) 98.1 F (36.7 C) (Oral)  Ht 5\' 10"  (1.778 m)  Wt  157 lb (71.215 kg)  BMI 22.53 kg/m2       Objective:   Physical Exam   Constitutional: Appears well-developed and well-nourished. No distress.  Head: Normocephalic and atraumatic.  Right Ear: External ear normal.  Left Ear: External ear normal.  Mouth/Throat: Oropharynx is clear and moist.  Eyes: Conjunctivae are normal. Pupils are equal, round, and reactive to light.  Neck: Normal range of motion. Neck supple. No thyromegaly present. No carotid bruit Cardiovascular: Normal rate, regular rhythm and normal heart sounds.  Exam reveals no gallop and no friction rub.   No murmur heard. Pulmonary/Chest: Effort normal and breath sounds normal.  No wheezes. No rales.  Abdominal: Soft. Bowel sounds are normal. No mass. There is no tenderness.  Neurological: Alert. No cranial nerve deficit.  Skin: Skin is warm and dry. no abnormal bruising Psychiatric: Normal mood and affect. Behavior is normal.        Assessment & Plan:

## 2011-08-25 ENCOUNTER — Ambulatory Visit (INDEPENDENT_AMBULATORY_CARE_PROVIDER_SITE_OTHER): Payer: Medicare Other | Admitting: *Deleted

## 2011-08-25 DIAGNOSIS — I4891 Unspecified atrial fibrillation: Secondary | ICD-10-CM

## 2011-08-25 DIAGNOSIS — Z7901 Long term (current) use of anticoagulants: Secondary | ICD-10-CM

## 2011-08-25 LAB — POCT INR: INR: 2.6

## 2011-08-27 ENCOUNTER — Encounter: Payer: Medicare Other | Admitting: *Deleted

## 2011-09-02 LAB — BASIC METABOLIC PANEL
BUN: 12
Calcium: 8.1 — ABNORMAL LOW
Chloride: 93 — ABNORMAL LOW
Creatinine, Ser: 0.96
GFR calc Af Amer: >60
GFR calc Af Amer: >60
GFR calc non Af Amer: >60
GFR calc non Af Amer: >60
Potassium: 4
Potassium: 4.1
Sodium: 126 — ABNORMAL LOW

## 2011-09-02 LAB — URINALYSIS, ROUTINE W REFLEX MICROSCOPIC
Ketones, ur: NEGATIVE
Nitrite: NEGATIVE
Specific Gravity, Urine: 1.01
pH: 6.5

## 2011-09-02 LAB — PROTIME-INR
INR: 1.1
INR: 1.3
INR: 1.6 — ABNORMAL HIGH
Prothrombin Time: 14.1
Prothrombin Time: 16.2 — ABNORMAL HIGH

## 2011-09-02 LAB — COMPREHENSIVE METABOLIC PANEL
ALT: 19
AST: 21
Alkaline Phosphatase: 69
CO2: 31
Calcium: 10
GFR calc Af Amer: >60
GFR calc non Af Amer: >60
Glucose, Bld: 81
Potassium: 5.1
Sodium: 138
Total Protein: 6.4

## 2011-09-02 LAB — DIFFERENTIAL
Lymphocytes Relative: 31
Lymphs Abs: 1.9
Monocytes Absolute: 0.4
Monocytes Relative: 7
Neutro Abs: 3.6

## 2011-09-02 LAB — URINE CULTURE: Colony Count: NO GROWTH

## 2011-09-02 LAB — APTT: aPTT: 40 — ABNORMAL HIGH

## 2011-09-02 LAB — CBC
HCT: 33 — ABNORMAL LOW
Hemoglobin: 10 — ABNORMAL LOW
Hemoglobin: 15.3
Platelets: 105 — ABNORMAL LOW
RBC: 3.19 — ABNORMAL LOW
RBC: 4.99
WBC: 10.4
WBC: 7.9

## 2011-09-02 LAB — TYPE AND SCREEN: Antibody Screen: NEGATIVE

## 2011-09-09 ENCOUNTER — Encounter: Payer: Self-pay | Admitting: Gastroenterology

## 2011-09-23 ENCOUNTER — Ambulatory Visit (INDEPENDENT_AMBULATORY_CARE_PROVIDER_SITE_OTHER): Payer: Medicare Other | Admitting: *Deleted

## 2011-09-23 DIAGNOSIS — Z7901 Long term (current) use of anticoagulants: Secondary | ICD-10-CM

## 2011-09-23 DIAGNOSIS — I4891 Unspecified atrial fibrillation: Secondary | ICD-10-CM

## 2011-09-23 LAB — POCT INR: INR: 3

## 2011-10-01 ENCOUNTER — Ambulatory Visit (INDEPENDENT_AMBULATORY_CARE_PROVIDER_SITE_OTHER): Payer: Medicare Other | Admitting: Gastroenterology

## 2011-10-01 ENCOUNTER — Encounter: Payer: Self-pay | Admitting: Gastroenterology

## 2011-10-01 DIAGNOSIS — R1310 Dysphagia, unspecified: Secondary | ICD-10-CM

## 2011-10-01 NOTE — Patient Instructions (Addendum)
Barium esophagram.  If a narrowing is noted, then you will need an EGD with dilation (holding coumadin).     Your test has been scheduled for 10/06/11 at 1130 am arrive at 1115 am at [facility] and have nothing to eat or drink after midnight.  Chew your food well, eat slowly, take small bites (especially when eating out). A copy of this information will be made available to Dr. Artist Pais.

## 2011-10-01 NOTE — Progress Notes (Signed)
HPI: This is a   very pleasant 77 year old man who I am meeting for the first time today  Has been having dysphagia to solids, intermittent.  About 1 time every 3-6 months.  Usually drinking liquids will help it go down.  Most recently the liquids welled up, regurgitated some thick fluid and felt better.  Overall stable weight.  He has pyrosis, about 1 to 2 times per month, takes no meds.  BArium esophaggram 2007 showed small HH, othwerwise normal.    Review of systems: Pertinent positive and negative review of systems were noted in the above HPI section. Complete review of systems was performed and was otherwise normal.    Past Medical History  Diagnosis Date  . 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 Atrial fibrillation   . Bradycardia   . Hypertension   . Complete heart block   . GERD (gastroesophageal reflux disease)   . COPD (chronic obstructive pulmonary disease)   . OSA (obstructive sleep apnea)   . CHF (congestive heart failure)   . Allergy   . BPH (benign prostatic hyperplasia)   . Osteoporosis   . GLUCOSE INTOLERANCE 10/22/2007  . ED (erectile dysfunction)     Past Surgical History  Procedure Date  . Pacemaker placement   . Partial hip arthroplasty     2008    Current Outpatient Prescriptions  Medication Sig Dispense Refill  . calcium carbonate (OS-CAL) 600 MG TABS Take 600 mg by mouth daily.        . cholecalciferol (VITAMIN D) 1000 UNITS tablet Take 1,000 Units by mouth daily.        . digoxin (LANOXIN) 0.25 MG tablet Take 1 tablet by mouth daily.      . Multiple Vitamin (MULTIVITAMIN) tablet Take 1 tablet by mouth daily.        . ramipril (ALTACE) 5 MG capsule Take 1 capsule by mouth Twice daily.      . tadalafil (CIALIS) 5 MG tablet Take 5 mg by mouth daily as needed.        . verapamil (VERELAN PM) 240 MG 24 hr capsule Take 1 tablet by mouth daily.      Marland Kitchen warfarin (COUMADIN) 5 MG tablet TAKE AS DIRECTED BY COUMADIN CLINIC.  90 tablet  1     Allergies as of 10/01/2011  . (No Known Allergies)    Family History  Problem Relation Age of Onset  . Heart disease    . Colon polyps    . Cancer Father     prostate  . Diabetes Brother   . Prostate cancer Father     History   Social History  . Marital Status: Married    Spouse Name: N/A    Number of Children: 2  . Years of Education: N/A   Occupational History  . chemist     Working part time   Social History Main Topics  . Smoking status: Never Smoker   . Smokeless tobacco: Not on file  . Alcohol Use: No  . Drug Use: No  . Sexually Active: Not on file   Other Topics Concern  . Not on file   Social History Narrative  . No narrative on file       Physical Exam: BP 126/74  Pulse 68  Ht 5\' 10"  (1.778 m)  Wt 157 lb (71.215 kg)  BMI 22.53 kg/m2 Constitutional: generally well-appearing Psychiatric: alert and oriented x3 Eyes: extraocular movements intact Mouth: oral pharynx moist, no lesions Neck: supple no  lymphadenopathy Cardiovascular: heart regular rate and rhythm Lungs: clear to auscultation bilaterally Abdomen: soft, nontender, nondistended, no obvious ascites, no peritoneal signs, normal bowel sounds Extremities: no lower extremity edema bilaterally Skin: no lesions on visible extremities    Assessment and plan: 77 y.o. male with  mild, intermittent, nonproductive solid food dysphasia without underlying typical GERD symptoms  His events happen very infrequently maybe twice a year. They are usually minor. He had a barium esophagram 5 years ago and it was normal except for a small hiatal hernia. I suggested several options including proceeding straight to EGD with dilation. He was however most interested in simply repeating an x-ray test such as a barium esophagram and if the narrowing is proven this time than he would like to proceed with EGD and dilation. We would have to hold his Coumadin that point. He knows to chew his food well, eat slowly  and take small bites.

## 2011-10-06 ENCOUNTER — Other Ambulatory Visit (HOSPITAL_COMMUNITY): Payer: Medicare Other

## 2011-10-09 ENCOUNTER — Ambulatory Visit (HOSPITAL_COMMUNITY)
Admission: RE | Admit: 2011-10-09 | Discharge: 2011-10-09 | Disposition: A | Discharge status: Home / Self Care | Payer: Medicare Other | Source: Ambulatory Visit | Attending: Gastroenterology | Admitting: Gastroenterology

## 2011-10-09 DIAGNOSIS — K219 Gastro-esophageal reflux disease without esophagitis: Secondary | ICD-10-CM | POA: Insufficient documentation

## 2011-10-09 DIAGNOSIS — R1310 Dysphagia, unspecified: Secondary | ICD-10-CM | POA: Insufficient documentation

## 2011-10-20 ENCOUNTER — Other Ambulatory Visit: Payer: Self-pay | Admitting: Pharmacist

## 2011-10-20 MED ORDER — WARFARIN SODIUM 5 MG PO TABS: 5.0000 mg | ORAL_TABLET | Freq: Every day | ORAL | Status: DC

## 2011-10-21 ENCOUNTER — Ambulatory Visit (INDEPENDENT_AMBULATORY_CARE_PROVIDER_SITE_OTHER): Payer: Medicare Other | Admitting: *Deleted

## 2011-10-21 DIAGNOSIS — Z7901 Long term (current) use of anticoagulants: Secondary | ICD-10-CM

## 2011-10-21 DIAGNOSIS — I4891 Unspecified atrial fibrillation: Secondary | ICD-10-CM

## 2011-10-21 LAB — POCT INR: INR: 2.6

## 2011-10-29 ENCOUNTER — Telehealth: Payer: Self-pay | Admitting: Internal Medicine

## 2011-10-29 NOTE — Telephone Encounter (Signed)
Pt needs to know how often he needs to do a check they called top do a phone check but he doesn't think he is to do those anymore

## 2011-10-29 NOTE — Telephone Encounter (Signed)
Pt is scheduled to see sk 02-13-12. Instructed pt to have phone check done by 11-15-11. Pt voices understanding.

## 2011-10-30 DIAGNOSIS — I495 Sick sinus syndrome: Secondary | ICD-10-CM

## 2011-11-24 ENCOUNTER — Ambulatory Visit (INDEPENDENT_AMBULATORY_CARE_PROVIDER_SITE_OTHER): Payer: Medicare Other | Admitting: *Deleted

## 2011-11-24 DIAGNOSIS — Z7901 Long term (current) use of anticoagulants: Secondary | ICD-10-CM

## 2011-11-24 DIAGNOSIS — I4891 Unspecified atrial fibrillation: Secondary | ICD-10-CM

## 2011-12-22 ENCOUNTER — Ambulatory Visit (INDEPENDENT_AMBULATORY_CARE_PROVIDER_SITE_OTHER): Payer: Medicare Other | Admitting: Pharmacist

## 2011-12-22 DIAGNOSIS — Z7901 Long term (current) use of anticoagulants: Secondary | ICD-10-CM | POA: Diagnosis not present

## 2011-12-22 DIAGNOSIS — I4891 Unspecified atrial fibrillation: Secondary | ICD-10-CM

## 2011-12-22 LAB — POCT INR: INR: 3

## 2012-01-19 ENCOUNTER — Ambulatory Visit (INDEPENDENT_AMBULATORY_CARE_PROVIDER_SITE_OTHER): Payer: Medicare Other | Admitting: *Deleted

## 2012-01-19 ENCOUNTER — Other Ambulatory Visit: Payer: Self-pay | Admitting: Internal Medicine

## 2012-01-19 DIAGNOSIS — I4891 Unspecified atrial fibrillation: Secondary | ICD-10-CM

## 2012-01-19 DIAGNOSIS — Z7901 Long term (current) use of anticoagulants: Secondary | ICD-10-CM

## 2012-01-19 LAB — POCT INR: INR: 3

## 2012-01-22 ENCOUNTER — Telehealth: Payer: Self-pay | Admitting: Internal Medicine

## 2012-01-22 DIAGNOSIS — I4891 Unspecified atrial fibrillation: Secondary | ICD-10-CM

## 2012-01-22 MED ORDER — VERAPAMIL HCL ER 240 MG PO TBCR: 240.0000 mg | EXTENDED_RELEASE_TABLET | Freq: Every day | ORAL | Status: DC

## 2012-01-22 NOTE — Telephone Encounter (Signed)
The patient called and stated that the capsule form of verapamil has gone to a tier 3 vs tier 2 for a tablet form. He has been on the capsule for a least a year now. I explained this is a recent change and we have gotten notification from some pharmacies asking to switch patients to a tablet form. He would like to be placed on the tablet form. I explained I can send this change to his pharmacy. He would like this sent to [facility] on [address].

## 2012-01-22 NOTE — Telephone Encounter (Signed)
New msg Pt was calling about verapamil. Why did it change from tablet to capsule. He said it is more expensive Please call

## 2012-02-02 ENCOUNTER — Other Ambulatory Visit (INDEPENDENT_AMBULATORY_CARE_PROVIDER_SITE_OTHER): Payer: Medicare Other

## 2012-02-02 DIAGNOSIS — D696 Thrombocytopenia, unspecified: Secondary | ICD-10-CM

## 2012-02-02 LAB — CBC WITH DIFFERENTIAL/PLATELET
Basophils Absolute: 0 10*3/uL (ref 0.0–0.1)
Eosinophils Absolute: 0.1 10*3/uL (ref 0.0–0.7)
HCT: 43.8 % (ref 39.0–52.0)
Lymphocytes Relative: 30.9 % (ref 12.0–46.0)
Lymphs Abs: 1.6 10*3/uL (ref 0.7–4.0)
MCHC: 33.2 g/dL (ref 30.0–36.0)
Monocytes Relative: 10.9 % (ref 3.0–12.0)
Platelets: 110 10*3/uL — ABNORMAL LOW (ref 150.0–400.0)
RDW: 14 % (ref 11.5–14.6)

## 2012-02-04 ENCOUNTER — Other Ambulatory Visit: Payer: Self-pay | Admitting: Internal Medicine

## 2012-02-04 NOTE — Progress Notes (Signed)
Quick Note:  Pt aware and would like to know why this test has been low since 1986. Pt would like to know exactly what this means and if there is anything he can do. Pls advise. ______

## 2012-02-13 ENCOUNTER — Encounter: Payer: Self-pay | Admitting: Internal Medicine

## 2012-02-13 ENCOUNTER — Ambulatory Visit (INDEPENDENT_AMBULATORY_CARE_PROVIDER_SITE_OTHER): Payer: Medicare Other | Admitting: Pharmacist

## 2012-02-13 ENCOUNTER — Ambulatory Visit (INDEPENDENT_AMBULATORY_CARE_PROVIDER_SITE_OTHER): Payer: Medicare Other | Admitting: Internal Medicine

## 2012-02-13 VITALS — BP 133/72 | HR 68 | Ht 70.0 in | Wt 163.0 lb

## 2012-02-13 DIAGNOSIS — I4891 Unspecified atrial fibrillation: Secondary | ICD-10-CM | POA: Diagnosis not present

## 2012-02-13 DIAGNOSIS — Z7901 Long term (current) use of anticoagulants: Secondary | ICD-10-CM | POA: Diagnosis not present

## 2012-02-13 DIAGNOSIS — I10 Essential (primary) hypertension: Secondary | ICD-10-CM | POA: Diagnosis not present

## 2012-02-13 DIAGNOSIS — Z950 Presence of cardiac pacemaker: Secondary | ICD-10-CM

## 2012-02-13 LAB — PACEMAKER DEVICE OBSERVATION
BMOD-0004RV: 5
BRDY-0002RV: 60 {beats}/min
BRDY-0004RV: 120 {beats}/min
RV LEAD AMPLITUDE: 9.2 mv

## 2012-02-13 LAB — POCT INR: INR: 2.5

## 2012-02-13 NOTE — Patient Instructions (Addendum)
You should hear from [employer] to increase your checks to monthly.  If they don't call, let us know.  Your physician wants you to follow-up in: 6 months with Dr Graciela Husbands. You will receive a reminder letter in the mail two months in advance. If you don't receive a letter, please call our office to schedule the follow-up appointment.  Your physician has recommended you make the following change in your medication:  1) Decrease digoxin (lanoxin) to 0.25 mg one tablet by mouth every other day.

## 2012-02-13 NOTE — Assessment & Plan Note (Signed)
Blood pressure well controlleda

## 2012-02-13 NOTE — Progress Notes (Signed)
  HPI  Eric Lambert is a 78 y.o. male is seen in followup for presumably permanent atrial fibrillation and associated tachycardia induced cardiomyopathy. He is status post pacemaker implantation. He has no complaints of chest pain or change in exercise intolerance.he remains somewhat short of breath climbing hills. He denies edema lightheadedness or syncope.   echo cardiogram 2008 demonstrated normal left ventricular function.    [name] remained stable.   Past Medical History  Diagnosis Date  . 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 Atrial fibrillation   . Bradycardia   . Hypertension   . Complete heart block   . GERD (gastroesophageal reflux disease)   . COPD (chronic obstructive pulmonary disease)   . OSA (obstructive sleep apnea)   . CHF (congestive heart failure)   . Allergy   . BPH (benign prostatic hyperplasia)   . Osteoporosis   . GLUCOSE INTOLERANCE 10/22/2007  . ED (erectile dysfunction)     Past Surgical History  Procedure Date  . Pacemaker placement   . Partial hip arthroplasty     2008    Current Outpatient Prescriptions  Medication Sig Dispense Refill  . calcium carbonate (OS-CAL) 600 MG TABS Take 600 mg by mouth daily.        . cholecalciferol (VITAMIN D) 1000 UNITS tablet Take 1,000 Units by mouth daily.        . digoxin (LANOXIN) 0.25 MG tablet Take 1 tablet by mouth daily.      . Multiple Vitamin (MULTIVITAMIN) tablet Take 1 tablet by mouth daily.        . ramipril (ALTACE) 5 MG capsule TAKE 1 CAPSULE TWICE A DAY  180 capsule  3  . tadalafil (CIALIS) 5 MG tablet Take 5 mg by mouth daily as needed.        . verapamil (CALAN-SR) 240 MG CR tablet Take 1 tablet (240 mg total) by mouth at bedtime.  90 tablet  3  . warfarin (COUMADIN) 5 MG tablet Take 1 tablet (5 mg total) by mouth daily.  90 tablet  1    No Known Allergies  Review of Systems negative except from HPI and PMH  Physical Exam BP 133/72  Pulse 68  Ht 5\' 10"   (1.778 m)  Wt 163 lb (73.936 kg)  BMI 23.39 kg/m2 Well developed and well nourished in no acute distress HENT normal E scleral and icterus clear Neck Supple JVP flat; carotids brisk and full Clear to ausculation Regular rate and rhythm, no murmurs gallops or rub Soft with active bowel sounds No clubbing cyanosis none Edema Alert and oriented, grossly normal motor and sensory function Skin Warm and Dry   Assessment and  Plan

## 2012-02-13 NOTE — Assessment & Plan Note (Signed)
Permanent. He is on warfarin. At his last assessment of LV function, we'll gradually wean his Lanoxin. He will take it every other day for the next months

## 2012-02-13 NOTE — Assessment & Plan Note (Signed)
The patient's device was interrogated.  The information was reviewed. No changes were made in the programming.    There was an episode of noise a year ago. There has been none since. I would anticipate however given his device dependence, undertake need revision at the time of his generator replacement.

## 2012-02-16 ENCOUNTER — Other Ambulatory Visit: Payer: Self-pay

## 2012-02-16 ENCOUNTER — Other Ambulatory Visit: Payer: Self-pay | Admitting: Internal Medicine

## 2012-02-16 DIAGNOSIS — I4891 Unspecified atrial fibrillation: Secondary | ICD-10-CM

## 2012-02-16 DIAGNOSIS — Z950 Presence of cardiac pacemaker: Secondary | ICD-10-CM

## 2012-02-16 MED ORDER — DIGOXIN 250 MCG PO TABS: ORAL_TABLET | ORAL | Status: DC

## 2012-02-26 ENCOUNTER — Encounter: Payer: Self-pay | Admitting: Gastroenterology

## 2012-03-01 ENCOUNTER — Telehealth: Payer: Self-pay | Admitting: Gastroenterology

## 2012-03-01 NOTE — Telephone Encounter (Signed)
Pt received a letter to schedule a office visit to discuss colon. He does not wish at this time to have appt.  He says his last colon was normal and is having no problems at this time.  He will call if he has any concerns in the future.

## 2012-03-15 ENCOUNTER — Encounter: Payer: Self-pay | Admitting: Internal Medicine

## 2012-03-15 DIAGNOSIS — I495 Sick sinus syndrome: Secondary | ICD-10-CM | POA: Diagnosis not present

## 2012-03-26 ENCOUNTER — Ambulatory Visit (INDEPENDENT_AMBULATORY_CARE_PROVIDER_SITE_OTHER): Payer: Medicare Other | Admitting: *Deleted

## 2012-03-26 DIAGNOSIS — Z7901 Long term (current) use of anticoagulants: Secondary | ICD-10-CM | POA: Diagnosis not present

## 2012-03-26 DIAGNOSIS — I4891 Unspecified atrial fibrillation: Secondary | ICD-10-CM | POA: Diagnosis not present

## 2012-04-18 ENCOUNTER — Other Ambulatory Visit: Payer: Self-pay | Admitting: Internal Medicine

## 2012-04-22 ENCOUNTER — Encounter: Payer: Self-pay | Admitting: Internal Medicine

## 2012-04-22 DIAGNOSIS — I495 Sick sinus syndrome: Secondary | ICD-10-CM | POA: Diagnosis not present

## 2012-04-23 ENCOUNTER — Ambulatory Visit (INDEPENDENT_AMBULATORY_CARE_PROVIDER_SITE_OTHER): Payer: Medicare Other | Admitting: *Deleted

## 2012-04-23 DIAGNOSIS — I4891 Unspecified atrial fibrillation: Secondary | ICD-10-CM

## 2012-04-23 DIAGNOSIS — Z7901 Long term (current) use of anticoagulants: Secondary | ICD-10-CM

## 2012-04-30 ENCOUNTER — Encounter: Payer: Self-pay | Admitting: *Deleted

## 2012-04-30 ENCOUNTER — Ambulatory Visit (INDEPENDENT_AMBULATORY_CARE_PROVIDER_SITE_OTHER): Payer: Medicare Other | Admitting: Internal Medicine

## 2012-04-30 ENCOUNTER — Encounter: Payer: Self-pay | Admitting: Internal Medicine

## 2012-04-30 VITALS — BP 137/77 | HR 61 | Ht 70.0 in | Wt 154.4 lb

## 2012-04-30 DIAGNOSIS — I4891 Unspecified atrial fibrillation: Secondary | ICD-10-CM | POA: Diagnosis not present

## 2012-04-30 DIAGNOSIS — I10 Essential (primary) hypertension: Secondary | ICD-10-CM | POA: Diagnosis not present

## 2012-04-30 DIAGNOSIS — Z950 Presence of cardiac pacemaker: Secondary | ICD-10-CM | POA: Diagnosis not present

## 2012-04-30 LAB — BASIC METABOLIC PANEL
Calcium: 9.4 mg/dL (ref 8.4–10.5)
GFR: 80.9 mL/min (ref >60.00)
Glucose, Bld: 70 mg/dL (ref 70–99)
Sodium: 138 mEq/L (ref 135–145)

## 2012-04-30 LAB — PACEMAKER DEVICE OBSERVATION
BMOD-0002RV: 10
BMOD-0003RV: 30

## 2012-04-30 LAB — CBC WITH DIFFERENTIAL/PLATELET
Basophils Absolute: 0 10*3/uL (ref 0.0–0.1)
Hemoglobin: 15 g/dL (ref 13.0–17.0)
Lymphocytes Relative: 30.9 % (ref 12.0–46.0)
Monocytes Relative: 11.3 % (ref 3.0–12.0)
Neutro Abs: 3 10*3/uL (ref 1.4–7.7)
Platelets: 133 10*3/uL — ABNORMAL LOW (ref 150.0–400.0)
RDW: 13.6 % (ref 11.5–14.6)
WBC: 5.6 10*3/uL (ref 4.5–10.5)

## 2012-04-30 LAB — PROTIME-INR: Prothrombin Time: 24.9 s — ABNORMAL HIGH (ref 10.2–12.4)

## 2012-04-30 NOTE — Assessment & Plan Note (Signed)
Well controlled 

## 2012-04-30 NOTE — Patient Instructions (Signed)
Your physician has recommended that you have a pacemaker generator change. Please see the instruction sheet given to you today for more information.    

## 2012-04-30 NOTE — Assessment & Plan Note (Signed)
Permanent on anticoagulation 

## 2012-04-30 NOTE — Progress Notes (Signed)
  HPI  Eric Lambert is a 78 y.o. male is seen in followup for presumably permanent atrial fibrillation and associated tachycardia induced cardiomyopathy. He is status post pacemaker implantation. He has no complaints of chest pain or change in exercise intolerance.he remains somewhat short of breath climbing hills. He denies edema lightheadedness or syncope.  echo cardiogram 2008 demonstrated normal left ventricular function.    His device has reached ERI  He would like to get done sooner rather than later  Past Medical History  Diagnosis Date  . 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 Atrial fibrillation   . Bradycardia   . Hypertension   . Complete heart block   . GERD (gastroesophageal reflux disease)   . COPD (chronic obstructive pulmonary disease)   . OSA (obstructive sleep apnea)   . CHF (congestive heart failure)   . Allergy   . BPH (benign prostatic hyperplasia)   . Osteoporosis   . GLUCOSE INTOLERANCE 10/22/2007  . ED (erectile dysfunction)     Past Surgical History  Procedure Date  . Pacemaker placement   . Partial hip arthroplasty     2008    Current Outpatient Prescriptions  Medication Sig Dispense Refill  . calcium carbonate (OS-CAL) 600 MG TABS Take 600 mg by mouth daily.        . cholecalciferol (VITAMIN D) 1000 UNITS tablet Take 1,000 Units by mouth daily.        . digoxin (LANOXIN) 0.25 MG tablet Take one tablet by mouth every other day  30 tablet  6  . Multiple Vitamin (MULTIVITAMIN) tablet Take 1 tablet by mouth daily.        . ramipril (ALTACE) 5 MG capsule TAKE 1 CAPSULE TWICE A DAY  180 capsule  3  . tadalafil (CIALIS) 5 MG tablet Take 5 mg by mouth daily as needed.        . verapamil (CALAN-SR) 240 MG CR tablet Take 1 tablet (240 mg total) by mouth at bedtime.  90 tablet  3  . warfarin (COUMADIN) 5 MG tablet Take as directed by the Anticoagulation Clinic  115 tablet  1    No Known Allergies  Review of Systems negative except from  HPI and PMH  Physical Exam BP 137/77  Pulse 61  Ht 5\' 10"  (1.778 m)  Wt 154 lb 6.4 oz (70.035 kg)  BMI 22.15 kg/m2 Well developed and well nourished in no acute distress HENT normal E scleral and icterus clear Neck Supple JVP flat; carotids brisk and full Clear to ausculation regular rate and rhythm, no murmurs gallops or rub Soft with active bowel sounds No clubbing cyanosis none Edema Alert and oriented, grossly normal motor and sensory function Skin Warm and Dry  ecg  >> V pacing with afib and intermittent intrinsic conduction  Assessment and  Plan

## 2012-04-30 NOTE — Assessment & Plan Note (Signed)
His device has reached ERI.his initial leads were implanted in 1996. I suspect that they will be fine. We reviewed the potential risks particularly related to infection. He understands and would like to proceed

## 2012-05-04 ENCOUNTER — Encounter (HOSPITAL_COMMUNITY): Payer: Self-pay | Admitting: Pharmacy Technician

## 2012-05-06 ENCOUNTER — Telehealth: Payer: Self-pay | Admitting: Internal Medicine

## 2012-05-06 DIAGNOSIS — I509 Heart failure, unspecified: Secondary | ICD-10-CM | POA: Diagnosis not present

## 2012-05-06 DIAGNOSIS — G4733 Obstructive sleep apnea (adult) (pediatric): Secondary | ICD-10-CM | POA: Diagnosis not present

## 2012-05-06 DIAGNOSIS — K219 Gastro-esophageal reflux disease without esophagitis: Secondary | ICD-10-CM | POA: Diagnosis not present

## 2012-05-06 DIAGNOSIS — I10 Essential (primary) hypertension: Secondary | ICD-10-CM | POA: Diagnosis not present

## 2012-05-06 DIAGNOSIS — Z45018 Encounter for adjustment and management of other part of cardiac pacemaker: Secondary | ICD-10-CM | POA: Diagnosis not present

## 2012-05-06 DIAGNOSIS — R0602 Shortness of breath: Secondary | ICD-10-CM | POA: Diagnosis not present

## 2012-05-06 DIAGNOSIS — I4891 Unspecified atrial fibrillation: Secondary | ICD-10-CM | POA: Diagnosis not present

## 2012-05-06 DIAGNOSIS — J449 Chronic obstructive pulmonary disease, unspecified: Secondary | ICD-10-CM | POA: Diagnosis not present

## 2012-05-06 DIAGNOSIS — N400 Enlarged prostate without lower urinary tract symptoms: Secondary | ICD-10-CM | POA: Diagnosis not present

## 2012-05-06 MED ORDER — CEFAZOLIN SODIUM-DEXTROSE 2-3 GM-% IV SOLR
2.0000 g | INTRAVENOUS | Status: AC
  Filled 2012-05-06 (×2): qty 50

## 2012-05-06 MED ORDER — GENTAMICIN SULFATE 40 MG/ML IJ SOLN
80.0000 mg | INTRAMUSCULAR | Status: AC
  Filled 2012-05-06: qty 2

## 2012-05-06 MED ORDER — SODIUM CHLORIDE 0.45 % IV SOLN
INTRAVENOUS | Status: DC
  Administered 2012-05-07: 13:00:00 via INTRAVENOUS

## 2012-05-06 MED ORDER — CHLORHEXIDINE GLUCONATE 4 % EX LIQD
60.0000 mL | Freq: Once | CUTANEOUS | Status: AC
  Administered 2012-05-07: 4 via TOPICAL
  Filled 2012-05-06: qty 60

## 2012-05-06 NOTE — Telephone Encounter (Signed)
I spoke with the patient. 

## 2012-05-06 NOTE — Telephone Encounter (Signed)
Follow-up:    Patient returned your call.  Please call back. 

## 2012-05-07 ENCOUNTER — Ambulatory Visit (HOSPITAL_COMMUNITY): Payer: Medicare Other

## 2012-05-07 ENCOUNTER — Encounter (HOSPITAL_COMMUNITY): Payer: Self-pay | Admitting: Internal Medicine

## 2012-05-07 ENCOUNTER — Encounter (HOSPITAL_COMMUNITY)
Admission: RE | Disposition: A | Discharge status: Home / Self Care | Payer: Self-pay | Source: Ambulatory Visit | Attending: Internal Medicine

## 2012-05-07 ENCOUNTER — Ambulatory Visit (HOSPITAL_COMMUNITY)
Admission: RE | Admit: 2012-05-07 | Discharge: 2012-05-07 | Disposition: A | Discharge status: Home / Self Care | Payer: Medicare Other | Source: Ambulatory Visit | Attending: Internal Medicine | Admitting: Internal Medicine

## 2012-05-07 DIAGNOSIS — Z950 Presence of cardiac pacemaker: Secondary | ICD-10-CM

## 2012-05-07 DIAGNOSIS — R0602 Shortness of breath: Secondary | ICD-10-CM | POA: Insufficient documentation

## 2012-05-07 DIAGNOSIS — G4733 Obstructive sleep apnea (adult) (pediatric): Secondary | ICD-10-CM | POA: Insufficient documentation

## 2012-05-07 DIAGNOSIS — J449 Chronic obstructive pulmonary disease, unspecified: Secondary | ICD-10-CM | POA: Insufficient documentation

## 2012-05-07 DIAGNOSIS — I509 Heart failure, unspecified: Secondary | ICD-10-CM | POA: Insufficient documentation

## 2012-05-07 DIAGNOSIS — I10 Essential (primary) hypertension: Secondary | ICD-10-CM | POA: Insufficient documentation

## 2012-05-07 DIAGNOSIS — Z01818 Encounter for other preprocedural examination: Secondary | ICD-10-CM | POA: Diagnosis not present

## 2012-05-07 DIAGNOSIS — I4891 Unspecified atrial fibrillation: Secondary | ICD-10-CM | POA: Insufficient documentation

## 2012-05-07 DIAGNOSIS — Z45018 Encounter for adjustment and management of other part of cardiac pacemaker: Secondary | ICD-10-CM | POA: Insufficient documentation

## 2012-05-07 DIAGNOSIS — K219 Gastro-esophageal reflux disease without esophagitis: Secondary | ICD-10-CM | POA: Insufficient documentation

## 2012-05-07 DIAGNOSIS — N400 Enlarged prostate without lower urinary tract symptoms: Secondary | ICD-10-CM | POA: Insufficient documentation

## 2012-05-07 DIAGNOSIS — I498 Other specified cardiac arrhythmias: Secondary | ICD-10-CM

## 2012-05-07 DIAGNOSIS — J4489 Other specified chronic obstructive pulmonary disease: Secondary | ICD-10-CM | POA: Insufficient documentation

## 2012-05-07 HISTORY — PX: PACEMAKER GENERATOR CHANGE: SHX5481

## 2012-05-07 LAB — PROTIME-INR: INR: 2.94 — ABNORMAL HIGH (ref 0.00–1.49)

## 2012-05-07 SURGERY — PACEMAKER GENERATOR CHANGE
Anesthesia: LOCAL

## 2012-05-07 MED ORDER — ACETAMINOPHEN 325 MG PO TABS: 325.0000 mg | ORAL_TABLET | ORAL | Status: DC | PRN

## 2012-05-07 MED ORDER — MUPIROCIN 2 % EX OINT
TOPICAL_OINTMENT | CUTANEOUS | Status: AC
  Administered 2012-05-07: 1 via NASAL
  Filled 2012-05-07: qty 22

## 2012-05-07 MED ORDER — MUPIROCIN 2 % EX OINT
TOPICAL_OINTMENT | Freq: Two times a day (BID) | CUTANEOUS | Status: DC
  Administered 2012-05-07: 1 via NASAL
  Filled 2012-05-07: qty 22

## 2012-05-07 MED ORDER — ONDANSETRON HCL 4 MG/2ML IJ SOLN: 4.0000 mg | Freq: Four times a day (QID) | INTRAMUSCULAR | Status: DC | PRN

## 2012-05-07 MED ORDER — SODIUM CHLORIDE 0.9 % IV SOLN: INTRAVENOUS | Status: DC

## 2012-05-07 MED ORDER — LIDOCAINE HCL (PF) 1 % IJ SOLN
INTRAMUSCULAR | Status: AC
  Filled 2012-05-07: qty 60

## 2012-05-07 NOTE — CV Procedure (Signed)
Preoperative diagnosis afib/bradycardia Postoperative diagnosis same  Procedure: Generator replacement/poceket revision  Following informed consent the patient was brought to the electrophysiology laboratory in place of the fluoroscopic table in the supine position after routine prep and drape lidocaine was infiltrated in the region of the previous incision and carried down to later the device pocket using sharp dissection and electrocautery. The pocket was opened the device was freed up and was explanted.  Interrogation of the previously implanted ventricular lead [id number] [phone]  demonstrated an R wave of 6.5 millivolts., and impedance of 803ohms, and a pacing threshold of 0.8 volts at 0.4 msec.    The previously implanted atrial lead was previously capped The leads were inspected. The lead -ventricular lead was  then attached to a [employer] pulse generator, serial number [id number].    The pocket was irrigated with antibiotic containing saline solution hemostasis was assured and the leads and the device were placed in the pocket. The wound is then closed in 2 layers in normal fashion.  The patient tolerated the procedure without apparent complication.  Sherryl Manges

## 2012-05-07 NOTE — Discharge Instructions (Signed)
Pacemaker Battery Change A pacemaker battery usually lasts 4 to 12 years. Once or twice per year, you will be asked to visit your caregiver to have a full evaluation of your pacemaker. When a battery needs to be replaced, the entire pacemaker is actually replaced so that you can benefit from new circuitry and any new features that have recently been added to pacemakers. Most often, this procedure is very simple because the leads are already in place. After giving medicine to numb the skin, your health care provider makes a cut to reopen the pocket holding the pacemaker and disconnects the old device from its leads. The leads are routinely tested at this time. If they are working okay, the new pacemaker may simply be connected to the existing leads. If there is any problem with the old lead system, it may be wise to replace the lead system while inserting the new pacemaker. There are many things that affect how long a pacemaker battery will last:  Age of the pacemaker.   Number of leads (1, 2 or 3).   Pacemaker work load. If the pacemaker is helping the heart more often, then the battery will not last as long as if the pacemaker does not need to help the heart.   Resistance of the leads. The greater the resistance, the greater the drain on the battery. This can increase as the leads get older or if one or more of the leads does not have the best contact with the heart.   Power (voltage) settings.   The health of the person's heart. If the health of the heart gets worse, then the pacemaker may have to work more often and the setting changed to accommodate these changes.  Your health care provider will be alerted to the fact that it is time to replace the battery during follow-up exams. He or she will check your pacemaker using a small table-top computer, called a programmer, and a wand. The wand is about the same size as a remote control. Your provider puts the wand on your body in the area where the  pacemaker is located. Information from the pacemaker is received about how well your heart is working and the status of the battery. It is not painful, and it usually takes just a few minutes. You will have plenty of time before the battery is fully used up to plan for replacement.  LET YOUR CAREGIVER KNOW ABOUT:   Symptoms of chest pain, trouble breathing, palpitations, lightheadedness, or feelings of an abnormal or irregular heart beat.   Allergies.   Medications taken including herbs, eye drops, over the counter medications, and creams   Use of steroids (by mouth or creams).   Possible pregnancy, if applicable.   Previous problems with anesthetics or Novocaine.   History of blood clots (thrombophlebitis).   History of bleeding or blood problems.   Surgery since your last pacemaker placement.   Other health problems.  RISKS AND COMPLICATIONS These are very uncommon but include:  Bleeding.   Bruising of the skin around where the incision was made.   Pain at the site of the incision.   Pulling apart of the skin at the incision site.   Infection.   Allergic reaction to anesthetics or medicines used during the procedure.  Diabetics may have a temporary increase in their blood sugar after any surgical procedure.  BEFORE THE PROCEDURE  Wash all of the skin around the area of the chest where the pacemaker is located.   Try to remove any loose, scaling skin. Unless advised otherwise, avoid using aspirin, ibuprofen, or naproxen for 3-4 days before the procedure. Ask your caregiver for help with any other medication adjustments before the pacemaker is replaced. Unless advised otherwise, do not eat or drink after midnight on the night before the procedure EXCEPT for drinking water and taking your medications as you normally would. AFTER THE PROCEDURE   A heart monitor and the pacemaker programmer will be used to make sure that the new pacemaker is working properly.   You can go home  after the procedure.   Your caregiver will advise you if you need to have any stitches. They will be removed 5-7 days after the procedure.  HOME CARE INSTRUCTIONS   Keep the incision clean and dry.   Unless advised otherwise, you may shower after carefully covering the incision with plastic wrap that is taped to your chest.   For the first week after the replacement, avoid stretching motions that pull at the incision site and avoid heavy exercise with the arm on the same side as the incision.   Only take over-the-counter or prescription medicines for pain, discomfort, or fever as directed by your caregiver.   Your caregiver will tell you when you will need to next test your pacemaker by telephone or when to return to the office for re-exam and/or removal of stitches, if necessary.  SEEK MEDICAL CARE IF:   You have unusual pain at the incision site that is not adequately helped by over-the-counter or prescription medicine.   There is drainage or pus from the incision site.   You develop red streaking that extends above or below the incision site.   You feel brief intermittent palpitations, lightheadedness or any symptoms that you feel might be related to your heart.  SEEK IMMEDIATE MEDICAL CARE IF:   You experience chest pain that is different than the pain at the incision site.   You experience:   Shortness of breath.   Palpitations.   Irregular heart beat.   Lightheadedness that does not go away quickly.   Fainting.   You develop a fever.   You have pain that gets worse even though you are taking pain medicine.  MAKE SURE YOU:   Understand these instructions.   Will watch your condition.   Will get help right away if you are not doing well or get worse.  Document Released: 02/18/2007 Document Revised: 10/30/2011 Document Reviewed: 05/24/2007 [employer] Patient Information 2012 [employer]. 

## 2012-05-07 NOTE — Interval H&P Note (Signed)
History and Physical Interval Note:  05/07/2012 1:45 PM  Eric Lambert  has presented today for surgery, with the diagnosis of End of life  The various methods of treatment have been discussed with the patient and family. After consideration of risks, benefits and other options for treatment, the patient has consented to  Procedure(s) (LRB): PACEMAKER GENERATOR CHANGE (N/A) as a surgical intervention .  The patients' history has been reviewed, patient examined, no change in status, stable for surgery.  I have reviewed the patients' chart and labs.  Questions were answered to the patient's satisfaction.     Sherryl Manges

## 2012-05-07 NOTE — H&P (View-Only) (Signed)
  HPI  Eric Lambert is a 78 y.o. male is seen in followup for presumably permanent atrial fibrillation and associated tachycardia induced cardiomyopathy. He is status post pacemaker implantation. He has no complaints of chest pain or change in exercise intolerance.he remains somewhat short of breath climbing hills. He denies edema lightheadedness or syncope.  echo cardiogram 2008 demonstrated normal left ventricular function.    His device has reached ERI  He would like to get done sooner rather than later  Past Medical History  Diagnosis Date  . 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 Atrial fibrillation   . Bradycardia   . Hypertension   . Complete heart block   . GERD (gastroesophageal reflux disease)   . COPD (chronic obstructive pulmonary disease)   . OSA (obstructive sleep apnea)   . CHF (congestive heart failure)   . Allergy   . BPH (benign prostatic hyperplasia)   . Osteoporosis   . GLUCOSE INTOLERANCE 10/22/2007  . ED (erectile dysfunction)     Past Surgical History  Procedure Date  . Pacemaker placement   . Partial hip arthroplasty     2008    Current Outpatient Prescriptions  Medication Sig Dispense Refill  . calcium carbonate (OS-CAL) 600 MG TABS Take 600 mg by mouth daily.        . cholecalciferol (VITAMIN D) 1000 UNITS tablet Take 1,000 Units by mouth daily.        . digoxin (LANOXIN) 0.25 MG tablet Take one tablet by mouth every other day  30 tablet  6  . Multiple Vitamin (MULTIVITAMIN) tablet Take 1 tablet by mouth daily.        . ramipril (ALTACE) 5 MG capsule TAKE 1 CAPSULE TWICE A DAY  180 capsule  3  . tadalafil (CIALIS) 5 MG tablet Take 5 mg by mouth daily as needed.        . verapamil (CALAN-SR) 240 MG CR tablet Take 1 tablet (240 mg total) by mouth at bedtime.  90 tablet  3  . warfarin (COUMADIN) 5 MG tablet Take as directed by the Anticoagulation Clinic  115 tablet  1    No Known Allergies  Review of Systems negative except from  HPI and PMH  Physical Exam BP 137/77  Pulse 61  Ht 5' 10" (1.778 m)  Wt 154 lb 6.4 oz (70.035 kg)  BMI 22.15 kg/m2 Well developed and well nourished in no acute distress HENT normal E scleral and icterus clear Neck Supple JVP flat; carotids brisk and full Clear to ausculation regular rate and rhythm, no murmurs gallops or rub Soft with active bowel sounds No clubbing cyanosis none Edema Alert and oriented, grossly normal motor and sensory function Skin Warm and Dry  ecg  >> V pacing with afib and intermittent intrinsic conduction  Assessment and  Plan  

## 2012-05-14 ENCOUNTER — Ambulatory Visit: Payer: Self-pay | Admitting: Cardiovascular Disease

## 2012-05-14 DIAGNOSIS — I4891 Unspecified atrial fibrillation: Secondary | ICD-10-CM

## 2012-05-17 ENCOUNTER — Encounter: Payer: Self-pay | Admitting: Internal Medicine

## 2012-05-17 ENCOUNTER — Ambulatory Visit (INDEPENDENT_AMBULATORY_CARE_PROVIDER_SITE_OTHER): Payer: Medicare Other | Admitting: *Deleted

## 2012-05-17 DIAGNOSIS — Z950 Presence of cardiac pacemaker: Secondary | ICD-10-CM | POA: Diagnosis not present

## 2012-05-17 DIAGNOSIS — I495 Sick sinus syndrome: Secondary | ICD-10-CM

## 2012-05-17 NOTE — Progress Notes (Signed)
Wound check-PPM 

## 2012-05-18 LAB — PACEMAKER DEVICE OBSERVATION
RV LEAD AMPLITUDE: 7.5 mv
RV LEAD IMPEDENCE PM: 840 Ohm
RV LEAD THRESHOLD: 1 V

## 2012-06-04 ENCOUNTER — Ambulatory Visit (INDEPENDENT_AMBULATORY_CARE_PROVIDER_SITE_OTHER): Payer: Medicare Other | Admitting: *Deleted

## 2012-06-04 DIAGNOSIS — Z7901 Long term (current) use of anticoagulants: Secondary | ICD-10-CM

## 2012-06-04 DIAGNOSIS — I4891 Unspecified atrial fibrillation: Secondary | ICD-10-CM

## 2012-06-04 DIAGNOSIS — N401 Enlarged prostate with lower urinary tract symptoms: Secondary | ICD-10-CM | POA: Diagnosis not present

## 2012-06-04 DIAGNOSIS — Z0000 Encounter for general adult medical examination without abnormal findings: Secondary | ICD-10-CM | POA: Diagnosis not present

## 2012-06-04 DIAGNOSIS — E559 Vitamin D deficiency, unspecified: Secondary | ICD-10-CM | POA: Diagnosis not present

## 2012-06-09 DIAGNOSIS — E559 Vitamin D deficiency, unspecified: Secondary | ICD-10-CM | POA: Diagnosis not present

## 2012-06-09 DIAGNOSIS — N401 Enlarged prostate with lower urinary tract symptoms: Secondary | ICD-10-CM | POA: Diagnosis not present

## 2012-07-02 ENCOUNTER — Ambulatory Visit (INDEPENDENT_AMBULATORY_CARE_PROVIDER_SITE_OTHER): Payer: Medicare Other | Admitting: *Deleted

## 2012-07-02 DIAGNOSIS — Z7901 Long term (current) use of anticoagulants: Secondary | ICD-10-CM | POA: Diagnosis not present

## 2012-07-02 DIAGNOSIS — I4891 Unspecified atrial fibrillation: Secondary | ICD-10-CM

## 2012-07-14 DIAGNOSIS — H2510 Age-related nuclear cataract, unspecified eye: Secondary | ICD-10-CM | POA: Diagnosis not present

## 2012-07-14 DIAGNOSIS — H3530 Unspecified macular degeneration: Secondary | ICD-10-CM | POA: Diagnosis not present

## 2012-07-27 ENCOUNTER — Encounter: Payer: Self-pay | Admitting: *Deleted

## 2012-07-30 ENCOUNTER — Ambulatory Visit (INDEPENDENT_AMBULATORY_CARE_PROVIDER_SITE_OTHER): Payer: Medicare Other | Admitting: *Deleted

## 2012-07-30 DIAGNOSIS — I4891 Unspecified atrial fibrillation: Secondary | ICD-10-CM | POA: Diagnosis not present

## 2012-07-30 DIAGNOSIS — Z7901 Long term (current) use of anticoagulants: Secondary | ICD-10-CM | POA: Diagnosis not present

## 2012-07-30 LAB — POCT INR: INR: 2

## 2012-08-02 ENCOUNTER — Encounter: Payer: Self-pay | Admitting: Internal Medicine

## 2012-08-02 ENCOUNTER — Ambulatory Visit (INDEPENDENT_AMBULATORY_CARE_PROVIDER_SITE_OTHER): Payer: Medicare Other | Admitting: Internal Medicine

## 2012-08-02 VITALS — BP 116/72 | HR 68 | Temp 98.0°F | Ht 65.0 in | Wt 158.0 lb

## 2012-08-02 DIAGNOSIS — I10 Essential (primary) hypertension: Secondary | ICD-10-CM

## 2012-08-02 DIAGNOSIS — Z0000 Encounter for general adult medical examination without abnormal findings: Secondary | ICD-10-CM

## 2012-08-02 DIAGNOSIS — D696 Thrombocytopenia, unspecified: Secondary | ICD-10-CM

## 2012-08-02 DIAGNOSIS — Z23 Encounter for immunization: Secondary | ICD-10-CM

## 2012-08-02 DIAGNOSIS — I4891 Unspecified atrial fibrillation: Secondary | ICD-10-CM | POA: Diagnosis not present

## 2012-08-02 NOTE — Progress Notes (Signed)
Subjective:    Patient ID: Eric Lambert, male    DOB: [birth year], 78 y.o.   MRN: [id number]  HPI  78 year old white male with history of hypertension, atrial fibrillation and mild from cytopenia for routine followup. Since previous visit patient seen by gastroenterologist for difficulty swallowing. His EGD is reported normal.  He was also seen by cardiology and had pacemaker replacement.  Hypertension-blood pressures are well controlled on all take 5 mg and verapamil SR 240 mg once daily.  Atrial fibrillation-he is maintained on warfarin. He discussed with his cardiologist possibly starting a new anticoagulant. However he is satisfied with using warfarin for now. He denies any abnormal bleeding.  Review of Systems No significant memory problems, he denies poor balance or history of falls, he denies depressive symptoms or mood disorder.  Past Medical History  Diagnosis Date  . 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 Atrial fibrillation -permanent   . Pacemaker BSX     dual  . Complete heart block   . GERD (gastroesophageal reflux disease)   . COPD (chronic obstructive pulmonary disease)   . OSA (obstructive sleep apnea)   . CHF (congestive heart failure)   . Allergy   . BPH (benign prostatic hyperplasia)   . Osteoporosis   . GLUCOSE INTOLERANCE 10/22/2007  . ED (erectile dysfunction)     History   Social History  . Marital Status: Married    Spouse Name: N/A    Number of Children: 2  . Years of Education: N/A   Occupational History  . chemist     Working part time   Social History Main Topics  . Smoking status: Never Smoker   . Smokeless tobacco: Not on file  . Alcohol Use: No  . Drug Use: No  . Sexually Active: Not on file   Other Topics Concern  . Not on file   Social History Narrative  . No narrative on file    Past Surgical History  Procedure Date  . Pacemaker placement   . Partial hip arthroplasty     2008    Family History    Problem Relation Age of Onset  . Heart disease    . Colon polyps    . Cancer Father     prostate  . Diabetes Brother   . Prostate cancer Father     No Known Allergies  Current Outpatient Prescriptions on File Prior to Visit  Medication Sig Dispense Refill  . calcium carbonate (OS-CAL) 600 MG TABS Take 600 mg by mouth 2 (two) times daily.       . cholecalciferol (VITAMIN D) 1000 UNITS tablet Take 1,000 Units by mouth daily.       . digoxin (LANOXIN) 0.25 MG tablet Take 250 mcg by mouth every other day. Odd days      . Multiple Vitamin (MULTIVITAMIN) tablet Take 1 tablet by mouth daily.        . ramipril (ALTACE) 5 MG capsule Take 5 mg by mouth 2 (two) times daily.      . tadalafil (CIALIS) 5 MG tablet Take 5 mg by mouth daily as needed. Erectile dysfunction        . verapamil (CALAN-SR) 240 MG CR tablet Take 240 mg by mouth daily.       Marland Kitchen warfarin (COUMADIN) 5 MG tablet Take 5 mg by mouth daily.        BP 116/72  Pulse 68  Temp 98 F (36.7 C) (Oral)  Ht 5\' 5"  (1.651 m)  Wt 158 lb (71.668 kg)  BMI 26.29 kg/m2       Objective:   Physical Exam  Constitutional: He is oriented to person, place, and time. He appears well-developed and well-nourished.  HENT:  Head: Normocephalic and atraumatic.  Right Ear: External ear normal.  Left Ear: External ear normal.  Eyes: Conjunctivae and EOM are normal. Pupils are equal, round, and reactive to light.  Neck: Neck supple.       No carotid bruit  Cardiovascular: Normal rate, regular rhythm and normal heart sounds.   Pulmonary/Chest: Effort normal and breath sounds normal. He has no wheezes.  Abdominal: Soft. Bowel sounds are normal. There is no tenderness.  Musculoskeletal: He exhibits no edema.  Neurological: He is alert and oriented to person, place, and time.       Normal gait  Skin: Skin is warm and dry.  Psychiatric: He has a normal mood and affect. His behavior is normal. Judgment normal.          Assessment & Plan:

## 2012-08-02 NOTE — Assessment & Plan Note (Signed)
Platelet counts stable.  Repeat yearly. Lab Results  Component Value Date   WBC 5.6 04/30/2012   HGB 15.0 04/30/2012   HCT 45.9 04/30/2012   MCV 94.2 04/30/2012   PLT 133.0* 04/30/2012

## 2012-08-02 NOTE — Assessment & Plan Note (Signed)
Well controlled.  No change in medication BP: 116/72 mmHg  Lab Results  Component Value Date   CREATININE 1.0 04/30/2012

## 2012-08-02 NOTE — Assessment & Plan Note (Signed)
Patient therapeutic on warfarin. He discussed possibly using a new anticoagulant with his cardiologist. However he defers use of new agent for now.

## 2012-08-02 NOTE — Patient Instructions (Addendum)
Please complete the following lab tests before your next follow up appointment: CBCD - 287.5 BMET - 401.9

## 2012-08-02 NOTE — Assessment & Plan Note (Signed)
Patient given influenza vaccine.

## 2012-08-05 ENCOUNTER — Encounter: Payer: Self-pay | Admitting: Internal Medicine

## 2012-08-05 ENCOUNTER — Telehealth: Payer: Self-pay | Admitting: Internal Medicine

## 2012-08-05 ENCOUNTER — Ambulatory Visit (INDEPENDENT_AMBULATORY_CARE_PROVIDER_SITE_OTHER): Payer: Medicare Other | Admitting: Internal Medicine

## 2012-08-05 VITALS — BP 124/67 | HR 59 | Ht 70.0 in | Wt 160.8 lb

## 2012-08-05 DIAGNOSIS — I4891 Unspecified atrial fibrillation: Secondary | ICD-10-CM | POA: Diagnosis not present

## 2012-08-05 DIAGNOSIS — I429 Cardiomyopathy, unspecified: Secondary | ICD-10-CM

## 2012-08-05 DIAGNOSIS — Z79899 Other long term (current) drug therapy: Secondary | ICD-10-CM | POA: Diagnosis not present

## 2012-08-05 DIAGNOSIS — Z950 Presence of cardiac pacemaker: Secondary | ICD-10-CM

## 2012-08-05 LAB — PACEMAKER DEVICE OBSERVATION
BRDY-0003RV: 115 {beats}/min
DEVICE MODEL PM: 115653
RV LEAD THRESHOLD: 1 V

## 2012-08-05 MED ORDER — DIGOXIN 250 MCG PO TABS
0.1250 mg | ORAL_TABLET | Freq: Every day | ORAL | Status: DC
Start: 2012-08-05 — End: 2013-02-02

## 2012-08-05 NOTE — Patient Instructions (Addendum)
Your physician wants you to follow-up in: 6 months with Dr. Graciela Husbands.  You will receive a reminder letter in the mail two months in advance. If you don't receive a letter, please call our office to schedule the follow-up appointment.  Decrease Lanoxin to 0.125mg  (1/2 tablet) every other day.

## 2012-08-05 NOTE — Telephone Encounter (Signed)
Pls advise.  

## 2012-08-05 NOTE — Progress Notes (Signed)
Patient Care Team: Doe-Hyun Sherran Needs, DO as PCP - General   HPI  Eric Lambert is a 78 y.o. male  seen in followup for permanent atrial fibrillation associated with previous tachycardia-induced cardiomyopathy now resolved. He is a history of a previously implanted pacemaker for which  he underwent generator replacement in June 2013  We have been gradually weaning him off of his Lanoxin. He has been on every other day for the last number of months. He has had no untoward effects.  He had a great trip to [place].  He denies chest pain or shortness of breath.  Past Medical History  Diagnosis Date  . 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 Atrial fibrillation -permanent   . Pacemaker BSX     dual  . Complete heart block   . GERD (gastroesophageal reflux disease)   . COPD (chronic obstructive pulmonary disease)   . OSA (obstructive sleep apnea)   . CHF (congestive heart failure)   . Allergy   . BPH (benign prostatic hyperplasia)   . Osteoporosis   . GLUCOSE INTOLERANCE 10/22/2007  . ED (erectile dysfunction)     Past Surgical History  Procedure Date  . Pacemaker placement   . Partial hip arthroplasty     2008    Current Outpatient Prescriptions  Medication Sig Dispense Refill  . calcium carbonate (OS-CAL) 600 MG TABS Take 600 mg by mouth 2 (two) times daily.       . cholecalciferol (VITAMIN D) 1000 UNITS tablet Take 1,000 Units by mouth daily.       . digoxin (LANOXIN) 0.25 MG tablet Take 250 mcg by mouth every other day. Odd days      . ramipril (ALTACE) 5 MG capsule Take 5 mg by mouth 2 (two) times daily.      . tadalafil (CIALIS) 5 MG tablet Take 5 mg by mouth daily as needed. Erectile dysfunction        . verapamil (CALAN-SR) 240 MG CR tablet Take 240 mg by mouth daily.       Marland Kitchen warfarin (COUMADIN) 5 MG tablet Take 5 mg by mouth daily.        No Known Allergies  Review of Systems negative except from HPI and PMH  Physical Exam BP 124/67  Pulse 59   Ht 5\' 10"  (1.778 m)  Wt 160 lb 12.8 oz (72.938 kg)  BMI 23.07 kg/m2 Well developed and well nourished in no acute distress HENT normal E scleral and icterus clear Neck Supple Device pocket well healed; without hematoma or erythema  Clear to ausculation irregularly irRegular rate and rhythm,  Soft with active bowel sounds No clubbing cyanosis none Edema Alert and oriented, grossly normal motor and sensory function Skin Warm and Dry    Assessment and  Plan

## 2012-08-05 NOTE — Telephone Encounter (Signed)
Called pt to make aware  and pt states he does not have hypertension.  Pt states it makes him worry when he is given a medication and does not know why he is taking it.  .  Pt states to the best of his knowledge he has not been treated for high blood pressure in 20 years.

## 2012-08-05 NOTE — Telephone Encounter (Signed)
Pt would like to know why MD is ordering cbcd and bmet before next ov sept 2014.

## 2012-08-05 NOTE — Assessment & Plan Note (Signed)
Permanent. On anticoagulation. Rate controlled. We'll been digoxin further to 0.125 every other day. Anticipate stopping at next visit

## 2012-08-05 NOTE — Telephone Encounter (Signed)
Monitor kidney function while he is on blood pressure medication and monitor low platelet count

## 2012-08-05 NOTE — Assessment & Plan Note (Signed)
The patient's device was interrogated.  The information was reviewed. No changes were made in the programming.    

## 2012-08-05 NOTE — Assessment & Plan Note (Signed)
As above.

## 2012-08-05 NOTE — Telephone Encounter (Signed)
He is on altace 5 mg which is a blood pressure medication.  It also helps his heart but it is an ACE inhibitor and patients should have their electrolytes and kidney function monitored at least yearly.

## 2012-08-06 ENCOUNTER — Telehealth: Payer: Self-pay | Admitting: Internal Medicine

## 2012-08-06 NOTE — Telephone Encounter (Signed)
It has always been monitored.  Altace in ACE inhibitor that lowers blood pressure and helps the heart.  It is not for kidney infection.

## 2012-08-06 NOTE — Telephone Encounter (Signed)
Pt wants to know if he should be taking kidney scans because he is on ramapril because his pcp suggested it

## 2012-08-06 NOTE — Telephone Encounter (Signed)
I spoke with the patient and made him aware that we check a bmp to assess renal function for ACE-I. He had one done in 04/2012 that was normal. I advised we do not need to repeat this now, but can recheck at his next office visit with Dr. Graciela Husbands unless he sees Dr. Artist Pais in the interim and he checks this.

## 2012-08-06 NOTE — Telephone Encounter (Signed)
Left message for pt to call back  °

## 2012-08-06 NOTE — Telephone Encounter (Signed)
Pt wants to know why this is an issue now.  He said that Dr Artist Pais and Dr Alberteen Spindle has always known he was on the altace and neither one has ever mentioned it was his kidney infection

## 2012-08-09 NOTE — Telephone Encounter (Signed)
Pt aware.

## 2012-08-09 NOTE — Telephone Encounter (Signed)
Pt called back and he stated that he doesn't need Dr Artist Pais, Dr Alberteen Spindle, and his urologist monitoring the same thing and pay for 2 test.  Pt would like Dr Artist Pais and Dr Alberteen Spindle to figure out who is going to monitor the BMET.  But in another sentence he says that Dr Alberteen Spindle has not checked it.

## 2012-08-09 NOTE — Telephone Encounter (Signed)
I'm sorry that was a typo.  It should say kidney function

## 2012-08-09 NOTE — Telephone Encounter (Signed)
If he test ordered by Dr. Graciela Husbands, then he does not need to have BMET with [place]

## 2012-08-09 NOTE — Telephone Encounter (Signed)
Left message for pt to call back  °

## 2012-08-12 ENCOUNTER — Telehealth: Payer: Self-pay | Admitting: Internal Medicine

## 2012-08-12 NOTE — Telephone Encounter (Signed)
PT WANTS TO KNOW IF BLOOD WORK WHERE  AND WHEN IT NEEDS TO BE DONE DUE TO RAMIPRIL PLS CALL [phone]

## 2012-08-12 NOTE — Telephone Encounter (Signed)
I spoke with the patient regarding labs due to ramipril. I explained this would be for a bmp. His last one was done in June 2013 prior to his device changeout. Renal function was normal at the time. The patient does not want multiple doctors checking this for him. He has decided he would like for Dr. Graciela Husbands to check this for him. I advised we can do this at this next office visit in March 2014. He is agreeable.

## 2012-08-31 ENCOUNTER — Ambulatory Visit (INDEPENDENT_AMBULATORY_CARE_PROVIDER_SITE_OTHER): Payer: Medicare Other | Admitting: *Deleted

## 2012-08-31 DIAGNOSIS — Z7901 Long term (current) use of anticoagulants: Secondary | ICD-10-CM | POA: Diagnosis not present

## 2012-08-31 DIAGNOSIS — I4891 Unspecified atrial fibrillation: Secondary | ICD-10-CM | POA: Diagnosis not present

## 2012-08-31 LAB — POCT INR: INR: 2.7

## 2012-09-23 DIAGNOSIS — L570 Actinic keratosis: Secondary | ICD-10-CM | POA: Diagnosis not present

## 2012-09-23 DIAGNOSIS — L821 Other seborrheic keratosis: Secondary | ICD-10-CM | POA: Diagnosis not present

## 2012-10-04 ENCOUNTER — Ambulatory Visit (INDEPENDENT_AMBULATORY_CARE_PROVIDER_SITE_OTHER): Payer: Medicare Other | Admitting: *Deleted

## 2012-10-04 DIAGNOSIS — Z7901 Long term (current) use of anticoagulants: Secondary | ICD-10-CM

## 2012-10-04 DIAGNOSIS — I4891 Unspecified atrial fibrillation: Secondary | ICD-10-CM

## 2012-10-04 LAB — POCT INR: INR: 2

## 2012-10-18 ENCOUNTER — Other Ambulatory Visit: Payer: Self-pay | Admitting: *Deleted

## 2012-10-18 ENCOUNTER — Telehealth: Payer: Self-pay | Admitting: Internal Medicine

## 2012-10-18 MED ORDER — LISINOPRIL 10 MG PO TABS: 10.0000 mg | ORAL_TABLET | Freq: Every day | ORAL | Status: DC

## 2012-10-18 NOTE — Telephone Encounter (Signed)
Pt has questions regarding his ramipril 5mg  and the tier has changed and the insurance company suggest he take generic and he wants to know is it ok for him to take and if so which one because they have suggested several

## 2012-10-18 NOTE — Telephone Encounter (Signed)
Pt has questions regarding his ramipril 5mg and the tier has changed and the insurance company suggest he take generic and he wants to know is it ok for him to take and if so which one because they have suggested several ° ° °

## 2012-10-18 NOTE — Telephone Encounter (Signed)
New rx for lisinoprol sent to [facility] [address], [place].

## 2012-10-29 ENCOUNTER — Encounter: Payer: Self-pay | Admitting: Internal Medicine

## 2012-11-15 ENCOUNTER — Ambulatory Visit (INDEPENDENT_AMBULATORY_CARE_PROVIDER_SITE_OTHER): Payer: Medicare Other | Admitting: *Deleted

## 2012-11-15 DIAGNOSIS — M779 Enthesopathy, unspecified: Secondary | ICD-10-CM | POA: Diagnosis not present

## 2012-11-15 DIAGNOSIS — M2040 Other hammer toe(s) (acquired), unspecified foot: Secondary | ICD-10-CM | POA: Diagnosis not present

## 2012-11-15 DIAGNOSIS — I4891 Unspecified atrial fibrillation: Secondary | ICD-10-CM

## 2012-11-15 DIAGNOSIS — M216X9 Other acquired deformities of unspecified foot: Secondary | ICD-10-CM | POA: Diagnosis not present

## 2012-11-15 DIAGNOSIS — Z7901 Long term (current) use of anticoagulants: Secondary | ICD-10-CM | POA: Diagnosis not present

## 2012-11-15 LAB — POCT INR: INR: 2.6

## 2012-12-07 ENCOUNTER — Other Ambulatory Visit: Payer: Self-pay | Admitting: Internal Medicine

## 2012-12-27 ENCOUNTER — Ambulatory Visit (INDEPENDENT_AMBULATORY_CARE_PROVIDER_SITE_OTHER): Payer: Medicare Other | Admitting: *Deleted

## 2012-12-27 DIAGNOSIS — Z7901 Long term (current) use of anticoagulants: Secondary | ICD-10-CM

## 2012-12-27 DIAGNOSIS — I4891 Unspecified atrial fibrillation: Secondary | ICD-10-CM

## 2013-02-01 ENCOUNTER — Other Ambulatory Visit: Payer: Self-pay | Admitting: Internal Medicine

## 2013-02-02 ENCOUNTER — Encounter: Payer: Self-pay | Admitting: Internal Medicine

## 2013-02-02 ENCOUNTER — Ambulatory Visit (INDEPENDENT_AMBULATORY_CARE_PROVIDER_SITE_OTHER): Payer: Medicare Other | Admitting: Internal Medicine

## 2013-02-02 ENCOUNTER — Ambulatory Visit (INDEPENDENT_AMBULATORY_CARE_PROVIDER_SITE_OTHER): Payer: Medicare Other | Admitting: *Deleted

## 2013-02-02 VITALS — BP 122/58 | HR 68 | Ht 70.0 in | Wt 158.9 lb

## 2013-02-02 DIAGNOSIS — I429 Cardiomyopathy, unspecified: Secondary | ICD-10-CM

## 2013-02-02 DIAGNOSIS — I498 Other specified cardiac arrhythmias: Secondary | ICD-10-CM | POA: Diagnosis not present

## 2013-02-02 DIAGNOSIS — I4891 Unspecified atrial fibrillation: Secondary | ICD-10-CM

## 2013-02-02 DIAGNOSIS — G4730 Sleep apnea, unspecified: Secondary | ICD-10-CM

## 2013-02-02 DIAGNOSIS — G4733 Obstructive sleep apnea (adult) (pediatric): Secondary | ICD-10-CM

## 2013-02-02 DIAGNOSIS — Z7901 Long term (current) use of anticoagulants: Secondary | ICD-10-CM

## 2013-02-02 DIAGNOSIS — Z950 Presence of cardiac pacemaker: Secondary | ICD-10-CM

## 2013-02-02 DIAGNOSIS — R001 Bradycardia, unspecified: Secondary | ICD-10-CM

## 2013-02-02 LAB — PACEMAKER DEVICE OBSERVATION
DEVICE MODEL PM: 115653
RV LEAD AMPLITUDE: 9.3 mv
RV LEAD IMPEDENCE PM: 815 Ohm
VENTRICULAR PACING PM: 77

## 2013-02-02 NOTE — Assessment & Plan Note (Signed)
Patient has chronotropic incompetent. He has some complaints of exercise intolerance and I've taken the liberty in his younger appearing than his age gentleman to increase his heart rate max sensor rate from 115-130

## 2013-02-02 NOTE — Progress Notes (Signed)
Patient Care Team: Doe-Hyun Sherran Needs, DO as PCP - General   HPI  Eric Lambert is a 79 y.o. 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  He is doing quite well. He has no complaints apart from modest fatigue and modest exercise intolerance. He has noted no change since we decreased his digoxin.Not withstanding this he is planning to go with his son and wife to [place] for a month and track all over the place.  He has a history of diagnosed sleep apnea. This was 7-8 years ago. He wore a mask for a while and this became disruptive. He has not been treated since. He says that his wife complains less of his snoring now than she used to. He takes this as if he may be  snoring less  He does have some daytime somnolence.    Past Medical History  Diagnosis Date  . 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 Atrial fibrillation -permanent   . Pacemaker BSX     dual  . Complete heart block   . GERD (gastroesophageal reflux disease)   . COPD (chronic obstructive pulmonary disease)   . OSA (obstructive sleep apnea)   . CHF (congestive heart failure)   . Allergy   . BPH (benign prostatic hyperplasia)   . Osteoporosis   . GLUCOSE INTOLERANCE 10/22/2007  . ED (erectile dysfunction)     Past Surgical History  Procedure Laterality Date  . Pacemaker placement    . Partial hip arthroplasty      2008    Current Outpatient Prescriptions  Medication Sig Dispense Refill  . calcium carbonate (OS-CAL) 600 MG TABS Take 600 mg by mouth 2 (two) times daily.       . cholecalciferol (VITAMIN D) 1000 UNITS tablet Take 1,000 Units by mouth daily.       . digoxin (LANOXIN) 0.25 MG tablet Take 0.5 tablets (0.125 mg total) by mouth daily. Odd days  30 tablet  1  . lisinopril (PRINIVIL,ZESTRIL) 10 MG tablet Take 1 tablet (10 mg total) by  mouth daily.  90 tablet  3  . tadalafil (CIALIS) 5 MG tablet Take 5 mg by mouth daily as needed. Erectile dysfunction        . verapamil (CALAN-SR) 240 MG CR tablet TAKE 1 TABLET (240 MG TOTAL) BY MOUTH AT BEDTIME.  90 tablet  3  . warfarin (COUMADIN) 5 MG tablet TAKE AS DIRECTED BY THE ANTICOAGULATION CLINIC  115 tablet  1   No current facility-administered medications for this visit.    No Known Allergies  Review of Systems negative except from HPI and PMH  Physical Exam BP 122/58  Pulse 68  Ht 5\' 10"  (1.778 m)  Wt 158 lb 14.4 oz (72.077 kg)  BMI 22.8 kg/m2 Well developed and well nourished in no acute distress HENT normal E scleral and icterus clear Neck Supple JVP flat; carotids brisk and full Clear to ausculation Regular rate and rhythm, no murmurs gallops or rub Soft with active bowel sounds No clubbing cyanosis none Edema Alert and oriented, grossly normal motor and sensory function Skin Warm and Dry    Assessment and  Plan

## 2013-02-02 NOTE — Assessment & Plan Note (Signed)
Permanent, on anticoagulation with warfarin. He has cold hands and feet but is not interested in changing to a NOAC.

## 2013-02-02 NOTE — Assessment & Plan Note (Signed)
The patient's device was interrogated and the information was fully reviewed.  The device was reprogrammed to  As above 

## 2013-02-02 NOTE — Assessment & Plan Note (Signed)
He has a positive sleep study in the past. He is disinclined to have a repeat sleep study. He is willing to see a sleep physician about re-initiation of therapy so will schedule her to see Dr. Shelle Iron.

## 2013-02-02 NOTE — Assessment & Plan Note (Signed)
Continue ACE inhibitor. We'll discontinue his digoxin

## 2013-02-02 NOTE — Patient Instructions (Addendum)
Your physician wants you to follow-up in: 8 MONTHS WITH DR Logan Bores will receive a reminder letter in the mail two months in advance. If you don't receive a letter, please call our office to schedule the follow-up appointment.   REFERRAL TO PULMONARY FOR SLEEP APNEA  STOP DIGOXIN

## 2013-02-22 ENCOUNTER — Ambulatory Visit (INDEPENDENT_AMBULATORY_CARE_PROVIDER_SITE_OTHER): Payer: Medicare Other | Admitting: Pulmonary Disease

## 2013-02-22 ENCOUNTER — Encounter: Payer: Self-pay | Admitting: Pulmonary Disease

## 2013-02-22 VITALS — BP 110/62 | HR 63 | Temp 97.9°F | Ht 70.0 in | Wt 160.2 lb

## 2013-02-22 DIAGNOSIS — G4733 Obstructive sleep apnea (adult) (pediatric): Secondary | ICD-10-CM

## 2013-02-22 NOTE — Progress Notes (Signed)
Subjective:    Patient ID: Eric Lambert, male    DOB: [birth year], 79 y.o.   MRN: [id number]  HPI The patient is an 79 year old male who I've been asked to see for management of obstructive sleep apnea.  He was diagnosed in 2006 with severe OSA, with an AHI of 100 events per hour.  He was ultimately titrated to 16 cm of optimal pressure during a titration study.  The patient wore CPAP for roughly one year on a consistent basis, and although his wife slept better, he did not see a big difference in his sleep or daytime alertness.  He had significant issues with mask comfort as well as inconvenience.  He has nocturia, which required him to take the mask on and off frequently during the night.  This ultimately caused him to discontinue the CPAP.  The patient currently does have snoring only when sleeping on his back, but does note frequent awakenings.  He feels that he is rested in the mornings upon arising, and only has sleep pressure in the afternoon if he is doing quiet activities.  He will often take a nap in the afternoon.  He denies any sleepiness with driving.  Of note, the patient's weight is stable from his last sleep study.  What time do you typically go to bed?( Between what hours) 8-9 8-9 at 1441 on 02/22/13 by Rhunette Croft, CMA How long does it take you to fall asleep? 1o mins 1o mins at 1441 on 02/22/13 by Rhunette Croft, CMA How many times during the night do you wake up? 3 3 at 1441 on 02/22/13 by Rhunette Croft, CMA What time do you get out of bed to start your day? 0700 0700 at 1441 on 02/22/13 by Rhunette Croft, CMA Do you drive or operate heavy machinery in your occupation? No No at 1441 on 02/22/13 by Rhunette Croft, CMA How much has your weight changed (up or down) over the past two years? (In pounds) 5 lb (2.268 kg) 5 lb (2.268 kg) at 1441 on 02/22/13 by Rhunette Croft, CMA Have you ever had a sleep study before? Yes Yes at 1441 on  02/22/13 by Rhunette Croft, CMA If yes, location of study? [facility] at 1441 on 02/22/13 by Rhunette Croft, CMA If yes, date of study? 5 to 10 years ago 5 to 10 years ago at 1441 on 02/22/13 by Rhunette Croft, CMA Do you currently use CPAP? No No at 1441 on 02/22/13 by Rhunette Croft, CMA Do you wear oxygen at any time? No No at 1441 on 02/22/13 by Rhunette Croft, CMA   Review of Systems  Constitutional: Negative for fever and unexpected weight change.  HENT: Positive for dental problem. Negative for ear pain, nosebleeds, congestion, sore throat, rhinorrhea, sneezing, trouble swallowing, postnasal drip and sinus pressure.   Eyes: Negative for redness and itching.  Respiratory: Positive for shortness of breath. Negative for cough, chest tightness and wheezing.   Cardiovascular: Positive for leg swelling ( feet/hand swelling). Negative for palpitations.  Gastrointestinal: Negative for nausea and vomiting.       Acid heartburn  Genitourinary: Negative for dysuria.  Musculoskeletal: Negative for joint swelling.  Skin: Negative for rash.  Neurological: Negative for headaches.  Hematological: Does not bruise/bleed easily.  Psychiatric/Behavioral: Negative for dysphoric mood. The patient is not nervous/anxious.        Objective:   Physical Exam Constitutional:  Thin male, no acute  distress  HENT:  Nares patent without discharge, but mild deviation to the left  Oropharynx without exudate, palate and uvula are normal  Eyes:  Perrla, eomi, no scleral icterus  Neck:  No JVD, no TMG  Cardiovascular:  Normal rate, regular rhythm, no rubs or gallops.  No murmurs        Intact distal pulses  Pulmonary :  Normal breath sounds, no stridor or respiratory distress   No rales, rhonchi, or wheezing  Abdominal:  Soft, nondistended, bowel sounds present.  No tenderness noted.   Musculoskeletal: mild lower extremity edema noted.  Lymph Nodes:  No cervical  lymphadenopathy noted  Skin:  No cyanosis noted  Neurologic:  Alert, appropriate, moves all 4 extremities without obvious deficit.         Assessment & Plan:

## 2013-02-22 NOTE — Patient Instructions (Addendum)
Will start back on cpap with a loaner autotitrating device for 2 weeks to optimize your pressure.  Will let you know the results once I receive your download. Will send you to the sleep center for a mask fitting, and to look at possible quick disconnect hoses. Would like to see you back 8 weeks after you start back on cpap.

## 2013-02-22 NOTE — Assessment & Plan Note (Signed)
The patient has a history of very severe obstructive sleep apnea from 2006, and has not had any significant weight change since that time.  This severity of sleep apnea is a little unusual in someone who is thin, but still quite possible.  He has been intolerant to CPAP in the past, primarily related to inconvenience with his mask fit.  The pressure or the mask itself were never the problem, but rather how easily he can put the mask on and take off during the middle of the night.  Even though he is not overly symptomatic during the day, I think we should try and aggressively treat this given its severity and his underlying cardiac issues.  Would like to have his mask fitted at the sleep Center, so he can look at the various options.  Will also try him on an auto titrating device at home for the first few weeks to relook at his pressure needs.

## 2013-03-02 ENCOUNTER — Encounter: Payer: Self-pay | Admitting: Gastroenterology

## 2013-03-07 ENCOUNTER — Ambulatory Visit (HOSPITAL_BASED_OUTPATIENT_CLINIC_OR_DEPARTMENT_OTHER): Payer: Medicare Other | Attending: Pulmonary Disease | Admitting: Radiology

## 2013-03-07 DIAGNOSIS — G4733 Obstructive sleep apnea (adult) (pediatric): Secondary | ICD-10-CM

## 2013-03-07 DIAGNOSIS — Z9989 Dependence on other enabling machines and devices: Secondary | ICD-10-CM

## 2013-03-10 ENCOUNTER — Telehealth: Payer: Self-pay | Admitting: Pulmonary Disease

## 2013-03-10 NOTE — Telephone Encounter (Signed)
Spoke with Eric Lambert states as FYI that patient to p/u auto titration device on May 28> pt going out of country--will p/u when he returns.   There is a letter from [place] that was given to [facility] before he left for the day with mask recommendation on it.   Patient called very flustered wanting to know why this has not been taken care of and who "dropped the ball" - I explained to the patient that no one has dropped the ball and that this letter was received today from the [facility] via [facility] and that Dr Shelle Iron will receive this once he returns on Monday morning. Pt was pleased to hear that we have received this and expressed understanding once I explained to him the process of these mask fittings and orders.   Please advise Dr Shelle Iron. Thanks.

## 2013-03-10 NOTE — Telephone Encounter (Signed)
Called and spoke with the pt and he is aware that we will check with [facility] on the status of this order for the 2 week loaner.  Pt stated that he did the mask fitting at sleep ctn on 4/14.  Pt wanted to check on the status on this machine since he will be out of the country from 4/25 through may.    Called and spoke with [facility] and she will check into this and call back.

## 2013-03-10 NOTE — Telephone Encounter (Signed)
I spoke with Eric Lambert. She stated they will go ahead and get pt 2 week auto scheduled and get everything straightened with him. Since he will be going out of town they will call him today. I called and made pt aware. Nothing further was needed

## 2013-03-14 ENCOUNTER — Other Ambulatory Visit: Payer: Self-pay | Admitting: Pulmonary Disease

## 2013-03-14 ENCOUNTER — Ambulatory Visit (INDEPENDENT_AMBULATORY_CARE_PROVIDER_SITE_OTHER): Payer: Medicare Other | Admitting: Pharmacist

## 2013-03-14 DIAGNOSIS — I4891 Unspecified atrial fibrillation: Secondary | ICD-10-CM

## 2013-03-14 DIAGNOSIS — Z7901 Long term (current) use of anticoagulants: Secondary | ICD-10-CM

## 2013-03-14 DIAGNOSIS — G4733 Obstructive sleep apnea (adult) (pediatric): Secondary | ICD-10-CM

## 2013-03-14 LAB — POCT INR: INR: 2.7

## 2013-03-14 NOTE — Telephone Encounter (Signed)
This order has been sent to [facility].

## 2013-03-14 NOTE — Telephone Encounter (Signed)
LMOM x 1  Order has been placed with [facility] for new mask.

## 2013-03-15 ENCOUNTER — Telehealth: Payer: Self-pay | Admitting: Pulmonary Disease

## 2013-03-15 NOTE — Telephone Encounter (Signed)
Melissa returned call. [phone]. Eric Lambert

## 2013-03-15 NOTE — Telephone Encounter (Signed)
Patient returning call. [phone]

## 2013-03-15 NOTE — Telephone Encounter (Signed)
I spoke with Eric Lambert and is aware. She stated Resp is working on this now. She stated they are trying to get him in todayl nothing further was needed

## 2013-03-15 NOTE — Telephone Encounter (Signed)
Nita Sells, CMA at 03/14/2013 5:21 PM   Status: Signed            LMOM x 1  Order has been placed with [facility] for new mask.   ---  i spoke with pt and made him aware. He stated he will be ging out of town tomorrow x 1 month and is wanting to know if this can be taking care of today. If not then its fine.  lmtcb x1 for melissa to advise her of this. Pt does not need a call back.

## 2013-04-25 ENCOUNTER — Ambulatory Visit (INDEPENDENT_AMBULATORY_CARE_PROVIDER_SITE_OTHER): Payer: Medicare Other | Admitting: *Deleted

## 2013-04-25 DIAGNOSIS — I4891 Unspecified atrial fibrillation: Secondary | ICD-10-CM

## 2013-04-25 DIAGNOSIS — Z7901 Long term (current) use of anticoagulants: Secondary | ICD-10-CM

## 2013-04-26 ENCOUNTER — Ambulatory Visit (INDEPENDENT_AMBULATORY_CARE_PROVIDER_SITE_OTHER): Payer: Medicare Other | Admitting: Family Medicine

## 2013-04-26 VITALS — BP 100/72 | HR 68 | Temp 99.6°F | Resp 18 | Ht 70.0 in | Wt 155.0 lb

## 2013-04-26 DIAGNOSIS — J329 Chronic sinusitis, unspecified: Secondary | ICD-10-CM

## 2013-04-26 DIAGNOSIS — R059 Cough, unspecified: Secondary | ICD-10-CM | POA: Diagnosis not present

## 2013-04-26 DIAGNOSIS — R509 Fever, unspecified: Secondary | ICD-10-CM

## 2013-04-26 DIAGNOSIS — R05 Cough: Secondary | ICD-10-CM

## 2013-04-26 LAB — POCT CBC
Granulocyte percent: 66.6 % (ref 37–80)
HCT, POC: 43.3 % — AB (ref 43.5–53.7)
Hemoglobin: 13.6 g/dL — AB (ref 14.1–18.1)
Lymph, poc: 1 (ref 0.6–3.4)
MCH, POC: 30.2 pg (ref 27–31.2)
MCHC: 31.4 g/dL — AB (ref 31.8–35.4)
MCV: 96.1 fL (ref 80–97)
MID (cbc): 0.3 (ref 0–0.9)
MPV: 9.6 fL (ref 0–99.8)
POC Granulocyte: 2.7 (ref 2–6.9)
POC LYMPH PERCENT: 25.7 %L (ref 10–50)
POC MID %: 7.7 % (ref 0–12)
Platelet Count, POC: 129 10*3/uL — AB (ref 142–424)
RBC: 4.51 M/uL — AB (ref 4.69–6.13)
RDW, POC: 14.2 %
WBC: 4 10*3/uL — AB (ref 4.6–10.2)

## 2013-04-26 MED ORDER — AMOXICILLIN 500 MG PO TABS: 500.0000 mg | ORAL_TABLET | Freq: Two times a day (BID) | ORAL | Status: DC

## 2013-04-26 MED ORDER — BENZONATATE 100 MG PO CAPS: 100.0000 mg | ORAL_CAPSULE | Freq: Three times a day (TID) | ORAL | Status: DC | PRN

## 2013-04-26 NOTE — Progress Notes (Signed)
[facility]:  Office Visit  Chief Complaint:  Chief Complaint  Patient presents with  . Cough    1 week  . Sinusitis  . Fever    HPI: Eric Lambert is a 79 y.o. male who complains of  1 week history of cough, wet cough but non productive, some congestion of  face,left ear pressure, has some mild sore throat, has not been wheezing, Temp has been 99, +chills. Has allergies, he states his allergy season  Has passed. He was taking antihistamine. Has tried otc meds without relief. He has been around wife who has been sick. He is at high risck for complications if he gets sick, he has a h/o subacute endocarditis, A.fib on chronic coumadin, pacemaker, OSA.   Past Medical History  Diagnosis Date  . 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 Atrial fibrillation -permanent   . Pacemaker BSX     dual  . Complete heart block   . GERD (gastroesophageal reflux disease)   . OSA (obstructive sleep apnea)   . CHF (congestive heart failure)   . Allergy   . BPH (benign prostatic hyperplasia)   . GLUCOSE INTOLERANCE 10/22/2007  . ED (erectile dysfunction)   . Asthma    Past Surgical History  Procedure Laterality Date  . Pacemaker placement    . Partial hip arthroplasty      2008  . Insert / replace / remove pacemaker     History   Social History  . Marital Status: Married    Spouse Name: N/A    Number of Children: 2  . Years of Education: N/A   Occupational History  . chemist     Working part time   Social History Main Topics  . Smoking status: Never Smoker   . Smokeless tobacco: Never Used  . Alcohol Use: No  . Drug Use: No  . Sexually Active: None   Other Topics Concern  . None   Social History Narrative  . None   Family History  Problem Relation Age of Onset  . Heart disease    . Colon polyps    . Cancer Father     prostate  . Diabetes Brother   . Prostate cancer Father    No Known Allergies Prior to Admission medications    Medication Sig Start Date End Date Taking? Authorizing Provider  calcium carbonate (OS-CAL) 600 MG TABS Take 600 mg by mouth 2 (two) times daily.    Yes Historical Provider, MD  cholecalciferol (VITAMIN D) 1000 UNITS tablet Take 1,000 Units by mouth daily.    Yes Historical Provider, MD  lisinopril (PRINIVIL,ZESTRIL) 10 MG tablet Take 1 tablet (10 mg total) by mouth daily. 10/18/12  Yes Duke Salvia, MD  tadalafil (CIALIS) 5 MG tablet Take 5 mg by mouth daily as needed. Erectile dysfunction     Yes Historical Provider, MD  verapamil (CALAN-SR) 240 MG CR tablet TAKE 1 TABLET (240 MG TOTAL) BY MOUTH AT BEDTIME. 02/01/13  Yes Duke Salvia, MD  warfarin (COUMADIN) 5 MG tablet TAKE AS DIRECTED BY THE ANTICOAGULATION CLINIC 12/07/12  Yes Duke Salvia, MD     ROS: The patient denies fevers, chills, night sweats, unintentional weight loss, chest pain, palpitations, wheezing, dyspnea on exertion, nausea, vomiting, abdominal pain, dysuria, hematuria, melena, numbness, weakness, or tingling.   All other systems have been reviewed and were otherwise negative with the exception of those mentioned in the HPI and as above.  PHYSICAL EXAM: Filed Vitals:   04/26/13 2006  BP: 100/72  Pulse: 68  Temp: 99.6 F (37.6 C)  Resp: 18   Filed Vitals:   04/26/13 2006  Height: 5\' 10"  (1.778 m)  Weight: 155 lb (70.308 kg)   Body mass index is 22.24 kg/(m^2).  General: Alert, no acute distress HEENT:  Normocephalic, atraumatic, oropharynx patent. TM nl. Mild sinus tenderenss, no exudates.  Cardiovascular:  Regular rate and rhythm, no rubs or gallops.+ systmurmur.   No Carotid bruits, radial pulse intact. No pedal edema.  Respiratory: Clear to auscultation bilaterally.  No wheezes, rales, or rhonchi.  No cyanosis, no use of accessory musculature GI: No organomegaly, abdomen is soft and non-tender, positive bowel sounds.  No masses. Skin: No rashes. Neurologic: Facial musculature  symmetric. Psychiatric: Patient is appropriate throughout our interaction. Lymphatic: No cervical lymphadenopathy Musculoskeletal: Gait intact.   LABS: Results for orders placed in visit on 04/26/13  POCT CBC      Result Value Range   WBC 4.0 (*) 4.6 - 10.2 K/uL   Lymph, poc 1.0  0.6 - 3.4   POC LYMPH PERCENT 25.7  10 - 50 %L   MID (cbc) 0.3  0 - 0.9   POC MID % 7.7  0 - 12 %M   POC Granulocyte 2.7  2 - 6.9   Granulocyte percent 66.6  37 - 80 %G   RBC 4.51 (*) 4.69 - 6.13 M/uL   Hemoglobin 13.6 (*) 14.1 - 18.1 g/dL   HCT, POC 16.1 (*) 09.6 - 53.7 %   MCV 96.1  80 - 97 fL   MCH, POC 30.2  27 - 31.2 pg   MCHC 31.4 (*) 31.8 - 35.4 g/dL   RDW, POC 04.5     Platelet Count, POC 129 (*) 142 - 424 K/uL   MPV 9.6  0 - 99.8 fL     EKG/XRAY:   Primary read interpreted by Dr. Conley Rolls at [facility].   ASSESSMENT/PLAN: Encounter Diagnoses  Name Primary?  . Cough Yes  . Sinusitis   . Elevated temperature    Advise to see Dr. Artist Pais for what may be chronic abnormalities in CBC, he will do this soemthime in the next 2 mos on annual f/u Viral vs bacterial URI Try tessalon perles first If no improvement take amox 500 mg BID x 10 days Gross sideeffects, risk and benefits, and alternatives of medications d/w patient. Patient is aware that all medications have potential sideeffects and we are unable to predict every sideeffect or drug-drug interaction that may occur. F/u prn   LE, THAO PHUONG, DO 04/26/2013 8:47 PM

## 2013-04-26 NOTE — Patient Instructions (Signed)

## 2013-05-02 ENCOUNTER — Telehealth: Payer: Self-pay | Admitting: Pulmonary Disease

## 2013-05-02 DIAGNOSIS — G4733 Obstructive sleep apnea (adult) (pediatric): Secondary | ICD-10-CM

## 2013-05-02 NOTE — Telephone Encounter (Signed)
lmomtcb x1 

## 2013-05-02 NOTE — Telephone Encounter (Signed)
LMTCB

## 2013-05-02 NOTE — Telephone Encounter (Signed)
Pt returned call; spoke with patient He stated that the 2 weeks that he has had the loaner CPAP he has been unable to wear it b/c he has been sick, the mask didn't fit properly, and reports that the machine leaked  [facility], spoke with Tamela Oddi who stated that pt is to still return the machine on 6.12.14 as planned and inform them when he gets there of the above issues.  If there is any information on the machine or lack thereof, they will forward it to [facility] for recommendations. Called spoke with patient, informed him of the above.  Pt assured me that there will be little-to-no information on the machine. Dr Shelle Iron please advise, thank you.

## 2013-05-02 NOTE — Telephone Encounter (Signed)
He needs to do re-do the auto for 2 more weeks with a better fitting mask.  [facility] needs to work with him on this, and send me the download after getting 2 weeks worth of reasonable data.

## 2013-05-03 NOTE — Telephone Encounter (Signed)
Pt aware and order/staff message sent to [facility]. Nothing further was needed

## 2013-05-16 ENCOUNTER — Telehealth: Payer: Self-pay | Admitting: Pulmonary Disease

## 2013-05-16 NOTE — Telephone Encounter (Signed)
Spoke with the pt  He states that he received unsolicited email from [employer] on COPD  He states that he was never advised that he had COPD  I advised him according to his chart, he does have dx of COPD- and this is per Dr Jonny Ruiz  I advised that [facility] had nothing to do with this, we see him for OSA and never have even evaluated him for COPD  Pt wants to know what [facility] thinks she should do about this  Please advise thanks

## 2013-05-16 NOTE — Telephone Encounter (Signed)
Let him know that I do not see where he has ever had breathing tests?  If this is true, then no one knows whether he has copd or not.  If he has not had breathing studies for evaluation, I would suggest that he speak with Dr. Jonny Ruiz regarding his copd diagnosis and work out whether he needs to have further testing done.

## 2013-05-17 ENCOUNTER — Telehealth: Payer: Self-pay | Admitting: *Deleted

## 2013-05-17 NOTE — Telephone Encounter (Signed)
Called, spoke with pt's wife.  Pt unable to come to phone right now.  She is requesting to have him call us back.  Will await call.

## 2013-05-17 NOTE — Telephone Encounter (Signed)
Pt returned call & can be reached at [phone] (between 12 PM -1 PM is the best time to call).  [name]

## 2013-05-17 NOTE — Telephone Encounter (Signed)
This was apparently noted on CXR from January 2000 by a radiologist  I can take if off the list  But it still may be true.  Would he consent to PFT's to look into this?

## 2013-05-17 NOTE — Telephone Encounter (Signed)
Pt called states he noticed a diagnosis of COPD on his MyChart.  Pt states that diagnosis is an error and would like for it to be removed.  He further states he has not been seen by Dr Jonny Ruiz in several years and was never told he had COPD.  He would like for it to be removed from his record and notified once it is done.

## 2013-05-17 NOTE — Telephone Encounter (Signed)
Informed the patient and he does not want to have PFT's testing done at this time

## 2013-05-17 NOTE — Telephone Encounter (Signed)
Spoke with pt and given Dr Teddy Spike recommendations.  Pt verbalized understanding and will f/u with Dr Jonny Ruiz regarding dx.

## 2013-06-01 DIAGNOSIS — E559 Vitamin D deficiency, unspecified: Secondary | ICD-10-CM | POA: Diagnosis not present

## 2013-06-01 DIAGNOSIS — Z125 Encounter for screening for malignant neoplasm of prostate: Secondary | ICD-10-CM | POA: Diagnosis not present

## 2013-06-05 ENCOUNTER — Other Ambulatory Visit: Payer: Self-pay | Admitting: Pulmonary Disease

## 2013-06-05 DIAGNOSIS — G4733 Obstructive sleep apnea (adult) (pediatric): Secondary | ICD-10-CM

## 2013-06-06 ENCOUNTER — Ambulatory Visit (INDEPENDENT_AMBULATORY_CARE_PROVIDER_SITE_OTHER): Payer: Medicare Other | Admitting: *Deleted

## 2013-06-06 DIAGNOSIS — I4891 Unspecified atrial fibrillation: Secondary | ICD-10-CM

## 2013-06-06 DIAGNOSIS — Z7901 Long term (current) use of anticoagulants: Secondary | ICD-10-CM | POA: Diagnosis not present

## 2013-06-06 LAB — POCT INR: INR: 3.3

## 2013-06-08 ENCOUNTER — Telehealth: Payer: Self-pay | Admitting: Pulmonary Disease

## 2013-06-08 DIAGNOSIS — Z8042 Family history of malignant neoplasm of prostate: Secondary | ICD-10-CM | POA: Diagnosis not present

## 2013-06-08 DIAGNOSIS — N401 Enlarged prostate with lower urinary tract symptoms: Secondary | ICD-10-CM | POA: Diagnosis not present

## 2013-06-08 DIAGNOSIS — N529 Male erectile dysfunction, unspecified: Secondary | ICD-10-CM | POA: Diagnosis not present

## 2013-06-08 NOTE — Telephone Encounter (Signed)
lmomtcb x1 

## 2013-06-08 NOTE — Telephone Encounter (Signed)
Pt is requesting a copy of his download to review himself. He states he is not sure he wants to proceed with CPAP treatment. I do not see download in chart but an order was sent to set pressure. Ashtyn do you know where the download may be? Pt wants copy mailed to home. Please advise. Carron Curie, CMA

## 2013-06-08 NOTE — Telephone Encounter (Signed)
Will call [facility] in AM [phone] to request D/L I do not have D/L on pt is download folder and there is not once scanned into chart.

## 2013-06-09 DIAGNOSIS — M19049 Primary osteoarthritis, unspecified hand: Secondary | ICD-10-CM | POA: Diagnosis not present

## 2013-06-09 DIAGNOSIS — IMO0002 Reserved for concepts with insufficient information to code with codable children: Secondary | ICD-10-CM | POA: Diagnosis not present

## 2013-06-09 DIAGNOSIS — M1610 Unilateral primary osteoarthritis, unspecified hip: Secondary | ICD-10-CM | POA: Diagnosis not present

## 2013-06-09 NOTE — Telephone Encounter (Signed)
Spoke with [facility], they have been unable to reach pt in regards to setting up an appt to have pressure changed to 17cm per [facility] (sent on 06/05/13)--this is why we have not received D/L Spoke with pt (he answered after the first ring)---states that he has not received any phone calls nor voicemails and he has been home everyday for the past couple weeks. Patient advised to call [facility] today and speak with them and schedule an appt to have his pressure changed.   Pt to call back if any problems. Nothing further needed.

## 2013-06-10 ENCOUNTER — Telehealth: Payer: Self-pay | Admitting: Internal Medicine

## 2013-06-10 NOTE — Telephone Encounter (Signed)
New Prob  Pt states he was place on meloxicam by another provider and he wants to make sure that is safe for him to take this. He said after reading about the medicine she is unsure.

## 2013-06-10 NOTE — Telephone Encounter (Signed)
Discussed with dr Graciela Husbands and the [facility], it is safe for the pt to use the meloxicam. Pt made aware to make sure to take with food, can cause increase of potential GI bleeding but will not interfere with warfarin. Left detailed message for the pt.

## 2013-06-22 ENCOUNTER — Telehealth: Payer: Self-pay | Admitting: Pulmonary Disease

## 2013-06-22 DIAGNOSIS — G4733 Obstructive sleep apnea (adult) (pediatric): Secondary | ICD-10-CM

## 2013-06-22 NOTE — Telephone Encounter (Signed)
I spoke with the pt and he states that his hose does not fit his new mask that he was given. I advised I will send an order to [facility] to get him the proper hose. The pt is also requesting a copy of the download that was done while he was on auto. I do not see this in the chart and according to previous phone note Ashtyn did not have it either so I called [facility] to have download re faxed to triage fax so I an send pt a copy.

## 2013-06-27 ENCOUNTER — Other Ambulatory Visit: Payer: Self-pay | Admitting: *Deleted

## 2013-06-27 MED ORDER — WARFARIN SODIUM 5 MG PO TABS: ORAL_TABLET | ORAL | Status: DC

## 2013-07-04 ENCOUNTER — Ambulatory Visit (INDEPENDENT_AMBULATORY_CARE_PROVIDER_SITE_OTHER): Payer: Medicare Other | Admitting: Pharmacist

## 2013-07-04 DIAGNOSIS — I4891 Unspecified atrial fibrillation: Secondary | ICD-10-CM

## 2013-07-04 DIAGNOSIS — Z7901 Long term (current) use of anticoagulants: Secondary | ICD-10-CM | POA: Diagnosis not present

## 2013-07-20 DIAGNOSIS — H3530 Unspecified macular degeneration: Secondary | ICD-10-CM | POA: Diagnosis not present

## 2013-07-20 DIAGNOSIS — H2510 Age-related nuclear cataract, unspecified eye: Secondary | ICD-10-CM | POA: Diagnosis not present

## 2013-07-26 ENCOUNTER — Other Ambulatory Visit (INDEPENDENT_AMBULATORY_CARE_PROVIDER_SITE_OTHER): Payer: Medicare Other

## 2013-07-26 DIAGNOSIS — I10 Essential (primary) hypertension: Secondary | ICD-10-CM | POA: Diagnosis not present

## 2013-07-26 LAB — CBC WITH DIFFERENTIAL/PLATELET
Basophils Absolute: 0 10*3/uL (ref 0.0–0.1)
Eosinophils Relative: 1.8 % (ref 0.0–5.0)
HCT: 40.2 % (ref 39.0–52.0)
Hemoglobin: 13.4 g/dL (ref 13.0–17.0)
Lymphs Abs: 1.1 10*3/uL (ref 0.7–4.0)
MCV: 90.8 fl (ref 78.0–100.0)
Monocytes Absolute: 0.4 10*3/uL (ref 0.1–1.0)
Monocytes Relative: 9.6 % (ref 3.0–12.0)
Neutro Abs: 2.7 10*3/uL (ref 1.4–7.7)
Platelets: 112 10*3/uL — ABNORMAL LOW (ref 150.0–400.0)
RDW: 15.2 % — ABNORMAL HIGH (ref 11.5–14.6)

## 2013-07-26 LAB — BASIC METABOLIC PANEL
BUN: 25 mg/dL — ABNORMAL HIGH (ref 6–23)
Chloride: 102 mEq/L (ref 96–112)
GFR: 74.3 mL/min (ref >60.00)
Glucose, Bld: 90 mg/dL (ref 70–99)
Potassium: 4 mEq/L (ref 3.5–5.1)
Sodium: 134 mEq/L — ABNORMAL LOW (ref 135–145)

## 2013-07-28 DIAGNOSIS — M19049 Primary osteoarthritis, unspecified hand: Secondary | ICD-10-CM | POA: Diagnosis not present

## 2013-08-01 ENCOUNTER — Ambulatory Visit (INDEPENDENT_AMBULATORY_CARE_PROVIDER_SITE_OTHER): Payer: Medicare Other | Admitting: *Deleted

## 2013-08-01 DIAGNOSIS — Z7901 Long term (current) use of anticoagulants: Secondary | ICD-10-CM

## 2013-08-01 DIAGNOSIS — I4891 Unspecified atrial fibrillation: Secondary | ICD-10-CM

## 2013-08-01 LAB — POCT INR: INR: 3.1

## 2013-08-02 ENCOUNTER — Encounter: Payer: Medicare Other | Admitting: Internal Medicine

## 2013-08-02 DIAGNOSIS — M25559 Pain in unspecified hip: Secondary | ICD-10-CM | POA: Diagnosis not present

## 2013-08-04 ENCOUNTER — Encounter: Payer: Self-pay | Admitting: Internal Medicine

## 2013-08-04 ENCOUNTER — Ambulatory Visit (INDEPENDENT_AMBULATORY_CARE_PROVIDER_SITE_OTHER): Payer: Medicare Other | Admitting: Internal Medicine

## 2013-08-04 VITALS — BP 120/60 | HR 64 | Temp 98.2°F | Ht 70.5 in | Wt 156.0 lb

## 2013-08-04 DIAGNOSIS — Z0000 Encounter for general adult medical examination without abnormal findings: Secondary | ICD-10-CM | POA: Diagnosis not present

## 2013-08-04 DIAGNOSIS — M25551 Pain in right hip: Secondary | ICD-10-CM | POA: Insufficient documentation

## 2013-08-04 DIAGNOSIS — M25559 Pain in unspecified hip: Secondary | ICD-10-CM | POA: Diagnosis not present

## 2013-08-04 DIAGNOSIS — Z23 Encounter for immunization: Secondary | ICD-10-CM

## 2013-08-04 DIAGNOSIS — I10 Essential (primary) hypertension: Secondary | ICD-10-CM

## 2013-08-04 LAB — HEPATIC FUNCTION PANEL
Bilirubin, Direct: 0.1 mg/dL (ref 0.0–0.3)
Total Bilirubin: 0.6 mg/dL (ref 0.3–1.2)

## 2013-08-04 LAB — LIPID PANEL
HDL: 83.9 mg/dL (ref >39.00)
Total CHOL/HDL Ratio: 2
VLDL: 13.2 mg/dL (ref 0.0–40.0)

## 2013-08-04 MED ORDER — VERAPAMIL HCL ER 240 MG PO TBCR: 240.0000 mg | EXTENDED_RELEASE_TABLET | Freq: Every day | ORAL | Status: DC

## 2013-08-04 MED ORDER — LISINOPRIL 10 MG PO TABS: 10.0000 mg | ORAL_TABLET | Freq: Every day | ORAL | Status: DC

## 2013-08-04 NOTE — Assessment & Plan Note (Signed)
Reviewed medicare health maintenance protocols.  Patient updated with influenza vaccine.  Mini mental status exam normal.  He likely has mild senility.  Check lipid panel.

## 2013-08-04 NOTE — Assessment & Plan Note (Signed)
Patient has history of right hip replacement. He has intermittent pain with weightbearing. He has been evaluated by his orthopedist. No improvement with Mobic. I suggest he discontinue NSAIDs due to higher risk of bleeding considering Coumadin use.

## 2013-08-04 NOTE — Progress Notes (Signed)
Subjective:    Patient ID: Eric Lambert, male    DOB: Apr 22, 1929, 79 y.o.   MRN: [id number]  HPI  79 year old white male with history of hypertension, atrial fibrillation, mild thrombocytopenia for routine Medicare physical. Patient denies any significant interval medical history. Patient being followed by his orthopedic specialist for intermittent right hip pain. He has history of right hip replacement.  Medicare questionnaire form reviewed in detail. He denies any depressive symptoms. Mild short-term memory loss. Mini-Mental status exam normal. See attached form for details.  Review of Systems  Constitutional: Negative for activity change, appetite change and unexpected weight change.  Eyes: Negative for visual disturbance.  Respiratory: Negative for cough, chest tightness and shortness of breath.   Cardiovascular: Negative for chest pain. no abnormal bleeding or bruising Genitourinary: Negative for difficulty urinating.  Neurological: Negative for headaches.  Gastrointestinal: Negative for abdominal pain, heartburn melena or hematochezia Psych: Negative for depression or anxiety Musculoskeletal: intermittent right hip pain.  Followed by Dr. Turner Daniels.  No improvement with Mobic.       Past Medical History  Diagnosis Date  . 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 Atrial fibrillation -permanent   . Pacemaker BSX     dual  . Complete heart block   . GERD (gastroesophageal reflux disease)   . OSA (obstructive sleep apnea)   . CHF (congestive heart failure)   . Allergy   . BPH (benign prostatic hyperplasia)   . GLUCOSE INTOLERANCE 10/22/2007  . ED (erectile dysfunction)   . Asthma     History   Social History  . Marital Status: Married    Spouse Name: N/A    Number of Children: 2  . Years of Education: N/A   Occupational History  . chemist     Working part time   Social History Main Topics  . Smoking status: Never Smoker   . Smokeless tobacco: Never Used   . Alcohol Use: No  . Drug Use: No  . Sexual Activity: Not on file   Other Topics Concern  . Not on file   Social History Narrative  . No narrative on file    Past Surgical History  Procedure Laterality Date  . Pacemaker placement    . Partial hip arthroplasty      2008  . Insert / replace / remove pacemaker      Family History  Problem Relation Age of Onset  . Heart disease    . Colon polyps    . Cancer Father     prostate  . Diabetes Brother   . Prostate cancer Father     No Known Allergies  Current Outpatient Prescriptions on File Prior to Visit  Medication Sig Dispense Refill  . amoxicillin (AMOXIL) 500 MG tablet Take 1 tablet (500 mg total) by mouth 2 (two) times daily.  20 tablet  0  . calcium carbonate (OS-CAL) 600 MG TABS Take 600 mg by mouth 2 (two) times daily.       . cholecalciferol (VITAMIN D) 1000 UNITS tablet Take 1,000 Units by mouth daily.       Marland Kitchen lisinopril (PRINIVIL,ZESTRIL) 10 MG tablet Take 1 tablet (10 mg total) by mouth daily.  90 tablet  3  . meloxicam (MOBIC) 15 MG tablet Take 15 mg by mouth daily.      . tadalafil (CIALIS) 5 MG tablet Take 5 mg by mouth daily as needed. Erectile dysfunction        . verapamil (CALAN-SR) 240 MG CR tablet  TAKE 1 TABLET (240 MG TOTAL) BY MOUTH AT BEDTIME.  90 tablet  3  . warfarin (COUMADIN) 5 MG tablet Take as directed by coumadin clinic  120 tablet  1   No current facility-administered medications on file prior to visit.    BP 120/60  Pulse 64  Temp(Src) 98.2 F (36.8 C) (Oral)  Ht 5' 10.5" (1.791 m)  Wt 156 lb (70.761 kg)  BMI 22.06 kg/m2    Objective:   Physical Exam  Constitutional: He is oriented to person, place, and time. He appears well-developed and well-nourished.  HENT:  Head: Normocephalic and atraumatic.  Right Ear: External ear normal.  Left Ear: External ear normal.  Hearing is grossly normal  Eyes: Conjunctivae and EOM are normal. Pupils are equal, round, and reactive to light.   Neck: Neck supple.  No carotid bruit  Cardiovascular: Normal rate, regular rhythm and normal heart sounds.   Pulmonary/Chest: Breath sounds normal. He has no wheezes.  Abdominal: Soft. Bowel sounds are normal. There is no tenderness.  Musculoskeletal: Normal range of motion. He exhibits no edema.  Lymphadenopathy:    He has no cervical adenopathy.  Neurological: He is alert and oriented to person, place, and time. He displays normal reflexes. No cranial nerve deficit. He exhibits normal muscle tone.  Skin: Skin is warm and dry.  Psychiatric: He has a normal mood and affect. His behavior is normal.          Assessment & Plan:

## 2013-08-05 ENCOUNTER — Other Ambulatory Visit (HOSPITAL_COMMUNITY): Payer: Self-pay | Admitting: Orthopedic Surgery

## 2013-08-05 DIAGNOSIS — M25551 Pain in right hip: Secondary | ICD-10-CM

## 2013-08-11 ENCOUNTER — Encounter (HOSPITAL_COMMUNITY)
Admission: RE | Admit: 2013-08-11 | Discharge: 2013-08-11 | Disposition: A | Discharge status: Home / Self Care | Payer: Medicare Other | Source: Ambulatory Visit | Attending: Orthopedic Surgery | Admitting: Orthopedic Surgery

## 2013-08-11 ENCOUNTER — Ambulatory Visit (HOSPITAL_COMMUNITY)
Admission: RE | Admit: 2013-08-11 | Discharge: 2013-08-11 | Disposition: A | Discharge status: Home / Self Care | Payer: Medicare Other | Source: Ambulatory Visit | Attending: Orthopedic Surgery | Admitting: Orthopedic Surgery

## 2013-08-11 DIAGNOSIS — Z96649 Presence of unspecified artificial hip joint: Secondary | ICD-10-CM | POA: Insufficient documentation

## 2013-08-11 DIAGNOSIS — I4891 Unspecified atrial fibrillation: Secondary | ICD-10-CM

## 2013-08-11 DIAGNOSIS — M25551 Pain in right hip: Secondary | ICD-10-CM

## 2013-08-11 DIAGNOSIS — G8929 Other chronic pain: Secondary | ICD-10-CM | POA: Diagnosis not present

## 2013-08-11 DIAGNOSIS — M25559 Pain in unspecified hip: Secondary | ICD-10-CM | POA: Insufficient documentation

## 2013-08-11 DIAGNOSIS — Z7901 Long term (current) use of anticoagulants: Secondary | ICD-10-CM

## 2013-08-11 MED ORDER — TECHNETIUM TC 99M MEDRONATE IV KIT
25.0000 | PACK | Freq: Once | INTRAVENOUS | Status: AC | PRN
  Administered 2013-08-11: 25 via INTRAVENOUS

## 2013-08-11 MED ORDER — IOHEXOL 300 MG/ML  SOLN
80.0000 mL | Freq: Once | INTRAMUSCULAR | Status: AC | PRN
  Administered 2013-08-11: 80 mL via INTRAVENOUS

## 2013-08-29 ENCOUNTER — Ambulatory Visit (INDEPENDENT_AMBULATORY_CARE_PROVIDER_SITE_OTHER): Payer: Medicare Other | Admitting: *Deleted

## 2013-08-29 DIAGNOSIS — Z7901 Long term (current) use of anticoagulants: Secondary | ICD-10-CM | POA: Diagnosis not present

## 2013-08-29 DIAGNOSIS — I4891 Unspecified atrial fibrillation: Secondary | ICD-10-CM

## 2013-08-29 DIAGNOSIS — Z5181 Encounter for therapeutic drug level monitoring: Secondary | ICD-10-CM

## 2013-08-29 LAB — POCT INR: INR: 3

## 2013-08-30 DIAGNOSIS — M25559 Pain in unspecified hip: Secondary | ICD-10-CM | POA: Diagnosis not present

## 2013-09-16 DIAGNOSIS — H3530 Unspecified macular degeneration: Secondary | ICD-10-CM | POA: Diagnosis not present

## 2013-09-16 DIAGNOSIS — H2510 Age-related nuclear cataract, unspecified eye: Secondary | ICD-10-CM | POA: Diagnosis not present

## 2013-09-22 DIAGNOSIS — L821 Other seborrheic keratosis: Secondary | ICD-10-CM | POA: Diagnosis not present

## 2013-09-22 DIAGNOSIS — D234 Other benign neoplasm of skin of scalp and neck: Secondary | ICD-10-CM | POA: Diagnosis not present

## 2013-09-22 DIAGNOSIS — L570 Actinic keratosis: Secondary | ICD-10-CM | POA: Diagnosis not present

## 2013-09-22 DIAGNOSIS — D1801 Hemangioma of skin and subcutaneous tissue: Secondary | ICD-10-CM | POA: Diagnosis not present

## 2013-09-26 ENCOUNTER — Ambulatory Visit (INDEPENDENT_AMBULATORY_CARE_PROVIDER_SITE_OTHER): Payer: Medicare Other | Admitting: Pharmacist

## 2013-09-26 DIAGNOSIS — Z7901 Long term (current) use of anticoagulants: Secondary | ICD-10-CM | POA: Diagnosis not present

## 2013-09-26 DIAGNOSIS — I4891 Unspecified atrial fibrillation: Secondary | ICD-10-CM | POA: Diagnosis not present

## 2013-09-26 LAB — POCT INR: INR: 2

## 2013-10-06 DIAGNOSIS — H2510 Age-related nuclear cataract, unspecified eye: Secondary | ICD-10-CM | POA: Diagnosis not present

## 2013-10-06 DIAGNOSIS — H2589 Other age-related cataract: Secondary | ICD-10-CM | POA: Diagnosis not present

## 2013-10-24 ENCOUNTER — Ambulatory Visit (INDEPENDENT_AMBULATORY_CARE_PROVIDER_SITE_OTHER): Payer: Medicare Other | Admitting: General Practice

## 2013-10-24 DIAGNOSIS — Z7901 Long term (current) use of anticoagulants: Secondary | ICD-10-CM | POA: Diagnosis not present

## 2013-10-24 DIAGNOSIS — I4891 Unspecified atrial fibrillation: Secondary | ICD-10-CM

## 2013-10-24 LAB — POCT INR: INR: 3.5

## 2013-11-15 ENCOUNTER — Other Ambulatory Visit: Payer: Self-pay | Admitting: Internal Medicine

## 2013-11-19 ENCOUNTER — Other Ambulatory Visit: Payer: Self-pay | Admitting: Internal Medicine

## 2013-11-21 ENCOUNTER — Other Ambulatory Visit: Payer: Self-pay | Admitting: Internal Medicine

## 2013-11-21 ENCOUNTER — Ambulatory Visit (INDEPENDENT_AMBULATORY_CARE_PROVIDER_SITE_OTHER): Payer: Medicare Other

## 2013-11-21 DIAGNOSIS — Z5181 Encounter for therapeutic drug level monitoring: Secondary | ICD-10-CM | POA: Diagnosis not present

## 2013-11-21 DIAGNOSIS — Z7901 Long term (current) use of anticoagulants: Secondary | ICD-10-CM | POA: Diagnosis not present

## 2013-11-21 DIAGNOSIS — I4891 Unspecified atrial fibrillation: Secondary | ICD-10-CM

## 2013-11-29 ENCOUNTER — Encounter: Payer: Self-pay | Admitting: Cardiology

## 2013-11-29 ENCOUNTER — Encounter: Payer: Self-pay | Admitting: Internal Medicine

## 2013-11-29 ENCOUNTER — Ambulatory Visit (INDEPENDENT_AMBULATORY_CARE_PROVIDER_SITE_OTHER): Payer: Medicare Other | Admitting: Cardiology

## 2013-11-29 ENCOUNTER — Other Ambulatory Visit: Payer: Self-pay | Admitting: Internal Medicine

## 2013-11-29 VITALS — BP 137/75 | HR 56 | Ht 70.0 in | Wt 160.8 lb

## 2013-11-29 DIAGNOSIS — I4891 Unspecified atrial fibrillation: Secondary | ICD-10-CM | POA: Diagnosis not present

## 2013-11-29 DIAGNOSIS — I495 Sick sinus syndrome: Secondary | ICD-10-CM

## 2013-11-29 DIAGNOSIS — Z950 Presence of cardiac pacemaker: Secondary | ICD-10-CM | POA: Diagnosis not present

## 2013-11-29 LAB — MDC_IDC_ENUM_SESS_TYPE_INCLINIC
Brady Statistic RV Percent Paced: 68 %
Implantable Pulse Generator Serial Number: 115653
Lead Channel Pacing Threshold Pulse Width: 0.4 ms
Lead Channel Sensing Intrinsic Amplitude: 6.7 mV
Lead Channel Setting Pacing Pulse Width: 0.4 ms
Lead Channel Setting Sensing Sensitivity: 2.5 mV
MDC IDC MSMT LEADCHNL RV IMPEDANCE VALUE: 848 Ohm
MDC IDC MSMT LEADCHNL RV PACING THRESHOLD AMPLITUDE: 1.1 V
MDC IDC SESS DTM: 20150106050000
MDC IDC SET LEADCHNL RV PACING AMPLITUDE: 2.5 V
Zone Setting Detection Interval: 375 ms

## 2013-11-29 NOTE — Progress Notes (Signed)
Patient ID: Eric Lambert MRN: [id number], DOB/AGE: 06/24/1929   Date of Visit: 11/29/2013  Primary Physician: Drema Pry, DO Primary Cardiologist: Caryl Comes, MD Reason for Visit: EP/device follow-up  History of Present Illness  Eric Lambert is a 80 y.o. male with tachy-brady syndrome s/p PPM implant and permanent AFib who presents today for routine electrophysiology followup. Since last being seen in our clinic, he reports he is doing well. He reports intermittent "jittery" sensation after eating "sugary foods" but this has been ongoing for years and is not worsening. He has no other complaints. He denies chest pain or shortness of breath. He denies palpitations, dizziness, near syncope or syncope. He denies LE swelling, orthopnea, PND or recent weight gain. He is compliant and tolerating medications without difficulty.  Past Medical History Past Medical History  Diagnosis Date  . 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 Atrial fibrillation -permanent   . Pacemaker BSX     dual  . Complete heart block   . GERD (gastroesophageal reflux disease)   . OSA (obstructive sleep apnea)   . CHF (congestive heart failure)   . Allergy   . BPH (benign prostatic hyperplasia)   . GLUCOSE INTOLERANCE 10/22/2007  . ED (erectile dysfunction)   . Asthma     Past Surgical History Past Surgical History  Procedure Laterality Date  . Pacemaker placement    . Partial hip arthroplasty      2008  . Insert / replace / remove pacemaker      Allergies/Intolerances No Known Allergies  Current Home Medications Current Outpatient Prescriptions  Medication Sig Dispense Refill  . calcium carbonate (OS-CAL) 600 MG TABS Take 600 mg by mouth 2 (two) times daily.       . cholecalciferol (VITAMIN D) 1000 UNITS tablet Take 1,000 Units by mouth daily.       Marland Kitchen lisinopril (PRINIVIL,ZESTRIL) 10 MG tablet TAKE 1 TABLET BY MOUTH EVERY DAY  90 tablet  0  . meloxicam (MOBIC) 15 MG tablet Take 15 mg by  mouth daily.      . tadalafil (CIALIS) 5 MG tablet Take 5 mg by mouth daily as needed. Erectile dysfunction        . verapamil (CALAN-SR) 240 MG CR tablet Take 1 tablet (240 mg total) by mouth daily.  90 tablet  3  . warfarin (COUMADIN) 5 MG tablet Take as directed by coumadin clinic  120 tablet  1   No current facility-administered medications for this visit.    Social History History   Social History  . Marital Status: Married    Spouse Name: N/A    Number of Children: 2  . Years of Education: N/A   Occupational History  . chemist     Working part time   Social History Main Topics  . Smoking status: Never Smoker   . Smokeless tobacco: Never Used  . Alcohol Use: No  . Drug Use: No  . Sexual Activity: Not on file   Other Topics Concern  . Not on file   Social History Narrative  . No narrative on file     Review of Systems General: No chills, fever, night sweats or weight changes Cardiovascular: No chest pain, dyspnea on exertion, edema, orthopnea, palpitations, paroxysmal nocturnal dyspnea Dermatological: No rash, lesions or masses Respiratory: No cough, dyspnea Urologic: No hematuria, dysuria Abdominal: No nausea, vomiting, diarrhea, bright red blood per rectum, melena, or hematemesis Neurologic: No visual changes, weakness, changes in mental status All other systems reviewed  and are otherwise negative except as noted above.  Physical Exam Vitals: Blood pressure 137/75, pulse 56, height 5\' 10"  (1.778 m), weight 160 lb 12.8 oz (72.938 kg).  General: Well developed, well appearing 81 y.o. male in no acute distress. HEENT: Normocephalic, atraumatic. EOMs intact. Sclera nonicteric. Oropharynx clear.  Neck: Supple. No JVD. Lungs: Respirations regular and unlabored, CTA bilaterally. No wheezes, rales or rhonchi. Heart: Irregular. S1, S2 present. No murmurs, rub, S3 or S4. Abdomen: Soft, non-distended.  Extremities: No clubbing, cyanosis or edema. PT/Radials 2+ and  equal bilaterally. Psych: Normal affect. Neuro: Alert and oriented X 3. Moves all extremities spontaneously.   Diagnostics 12-lead ECG today - atrial fibrillation with intermittent V pacing at 56 bpm Device interrogation today - Normal device function. Thresholds and sensing consistent with previous device measurements. Impedance trends stable over time. No evidence of any ventricular arrhythmias. Histogram distribution appropriate for patient and level of activity. No changes made this session. Device programmed at appropriate safety margins. Device programmed to optimize intrinsic conduction. Estimated longevity 3 years.   Assessment and Plan 1. Tachy-brady syndrome s/p PPM implant - normal device function - no programming changes made - return to device clinic in 6 months - return for follow-up with Dr. Caryl Comes in one year 2. Permanent AF - histograms show V rate is well controlled - continue rate control with Verapamil - continue warfarin for stroke prevention (has been offered NOAC previously but declined)  Signed, Joliyah Lippens, PA-C 11/29/2013, 12:59 PM

## 2013-11-29 NOTE — Patient Instructions (Addendum)
Your physician recommends that you keep your follow-up appointment with Device Clinic : July 8 th at [place] wants you to follow-up in: 1 year with  Dr. Gari Crown will receive a reminder letter in the mail two months in advance. If you don't receive a letter, please call our office to schedule the follow-up appointment.  Your physician recommends that you continue on your current medications as directed. Please refer to the Current Medication list given to you today.

## 2013-12-19 ENCOUNTER — Ambulatory Visit (INDEPENDENT_AMBULATORY_CARE_PROVIDER_SITE_OTHER): Payer: Medicare Other

## 2013-12-19 DIAGNOSIS — Z7901 Long term (current) use of anticoagulants: Secondary | ICD-10-CM | POA: Diagnosis not present

## 2013-12-19 DIAGNOSIS — I4891 Unspecified atrial fibrillation: Secondary | ICD-10-CM

## 2013-12-19 LAB — POCT INR: INR: 2.4

## 2013-12-30 ENCOUNTER — Telehealth: Payer: Self-pay | Admitting: Internal Medicine

## 2013-12-30 NOTE — Telephone Encounter (Signed)
PT  IS  WANTING   OPINION  IF   HAVING   CAROTIDS,ABDOMINAL AND  PVD SCREENING IS  SOMETHING    PT  SHOULD  PROCEED  WITH  WILL FORWARD TO  DR  Caryl Comes FOR  RECOMMENDATIONS./CY

## 2013-12-30 NOTE — Telephone Encounter (Signed)
New Message  Pt called states that he was referred to have "life-line screening". It is a portable lab and he requesting a call back to discuss if the screening(s) are necessary. Please call.

## 2014-01-02 NOTE — Telephone Encounter (Signed)
Advised pt that he is ok to have testing if he wishes. He asked if this testing would benefit Dr. Caryl Comes at all and I informed him it would not (per Dr. Caryl Comes shaking head no). Pt opting not to spend money for this testing.

## 2014-01-23 ENCOUNTER — Ambulatory Visit (INDEPENDENT_AMBULATORY_CARE_PROVIDER_SITE_OTHER): Payer: Medicare Other

## 2014-01-23 DIAGNOSIS — I4891 Unspecified atrial fibrillation: Secondary | ICD-10-CM | POA: Diagnosis not present

## 2014-01-23 DIAGNOSIS — Z7901 Long term (current) use of anticoagulants: Secondary | ICD-10-CM

## 2014-01-23 LAB — POCT INR: INR: 2

## 2014-01-30 ENCOUNTER — Other Ambulatory Visit: Payer: Self-pay | Admitting: Internal Medicine

## 2014-03-06 ENCOUNTER — Ambulatory Visit (INDEPENDENT_AMBULATORY_CARE_PROVIDER_SITE_OTHER): Payer: Medicare Other

## 2014-03-06 DIAGNOSIS — I4891 Unspecified atrial fibrillation: Secondary | ICD-10-CM | POA: Diagnosis not present

## 2014-03-06 DIAGNOSIS — Z7901 Long term (current) use of anticoagulants: Secondary | ICD-10-CM | POA: Diagnosis not present

## 2014-03-06 LAB — POCT INR: INR: 2.5

## 2014-04-18 ENCOUNTER — Ambulatory Visit (INDEPENDENT_AMBULATORY_CARE_PROVIDER_SITE_OTHER): Payer: Medicare Other | Admitting: Pharmacist

## 2014-04-18 DIAGNOSIS — I4891 Unspecified atrial fibrillation: Secondary | ICD-10-CM | POA: Diagnosis not present

## 2014-04-18 DIAGNOSIS — Z7901 Long term (current) use of anticoagulants: Secondary | ICD-10-CM

## 2014-04-18 LAB — POCT INR: INR: 2

## 2014-05-31 ENCOUNTER — Ambulatory Visit (INDEPENDENT_AMBULATORY_CARE_PROVIDER_SITE_OTHER): Payer: Medicare Other | Admitting: *Deleted

## 2014-05-31 ENCOUNTER — Ambulatory Visit (INDEPENDENT_AMBULATORY_CARE_PROVIDER_SITE_OTHER): Payer: Medicare Other | Admitting: Surgery

## 2014-05-31 DIAGNOSIS — I4891 Unspecified atrial fibrillation: Secondary | ICD-10-CM

## 2014-05-31 DIAGNOSIS — Z7901 Long term (current) use of anticoagulants: Secondary | ICD-10-CM

## 2014-05-31 DIAGNOSIS — Z950 Presence of cardiac pacemaker: Secondary | ICD-10-CM

## 2014-05-31 LAB — MDC_IDC_ENUM_SESS_TYPE_INCLINIC
Brady Statistic RV Percent Paced: 57 %
Implantable Pulse Generator Serial Number: 115653
Lead Channel Impedance Value: 834 Ohm
Lead Channel Pacing Threshold Pulse Width: 0.4 ms
Lead Channel Sensing Intrinsic Amplitude: 9.8 mV
Lead Channel Setting Pacing Amplitude: 2.5 V
Lead Channel Setting Pacing Pulse Width: 0.4 ms
Lead Channel Setting Sensing Sensitivity: 2.5 mV
MDC IDC MSMT LEADCHNL RV PACING THRESHOLD AMPLITUDE: 1 V
MDC IDC SET ZONE DETECTION INTERVAL: 375 ms

## 2014-05-31 LAB — POCT INR: INR: 2.5

## 2014-05-31 NOTE — Progress Notes (Signed)
Pacemaker check in clinic. Normal device function. Thresholds, sensing, impedances consistent with previous measurements. Device programmed to maximize longevity. 22 VHR---only 2 EGMS, both show consistent of AF w/ RVR + coumadin. Device programmed at appropriate safety margins. Histogram distribution appropriate for patient activity level. Device programmed to optimize intrinsic conduction. Estimated longevity 44yrs. ROV w/ Dr. Caryl Comes in 50mo.

## 2014-06-07 DIAGNOSIS — G43909 Migraine, unspecified, not intractable, without status migrainosus: Secondary | ICD-10-CM | POA: Diagnosis not present

## 2014-06-23 DIAGNOSIS — N139 Obstructive and reflux uropathy, unspecified: Secondary | ICD-10-CM | POA: Diagnosis not present

## 2014-06-23 DIAGNOSIS — N401 Enlarged prostate with lower urinary tract symptoms: Secondary | ICD-10-CM | POA: Diagnosis not present

## 2014-06-27 DIAGNOSIS — N401 Enlarged prostate with lower urinary tract symptoms: Secondary | ICD-10-CM | POA: Diagnosis not present

## 2014-06-27 DIAGNOSIS — N139 Obstructive and reflux uropathy, unspecified: Secondary | ICD-10-CM | POA: Diagnosis not present

## 2014-06-30 ENCOUNTER — Encounter: Payer: Self-pay | Admitting: Internal Medicine

## 2014-07-12 ENCOUNTER — Ambulatory Visit (INDEPENDENT_AMBULATORY_CARE_PROVIDER_SITE_OTHER): Payer: Medicare Other | Admitting: Pharmacist

## 2014-07-12 DIAGNOSIS — Z7901 Long term (current) use of anticoagulants: Secondary | ICD-10-CM | POA: Diagnosis not present

## 2014-07-12 DIAGNOSIS — I4891 Unspecified atrial fibrillation: Secondary | ICD-10-CM

## 2014-07-12 LAB — POCT INR: INR: 2.7

## 2014-07-26 DIAGNOSIS — N401 Enlarged prostate with lower urinary tract symptoms: Secondary | ICD-10-CM | POA: Diagnosis not present

## 2014-08-19 ENCOUNTER — Other Ambulatory Visit: Payer: Self-pay | Admitting: Internal Medicine

## 2014-08-23 ENCOUNTER — Ambulatory Visit (INDEPENDENT_AMBULATORY_CARE_PROVIDER_SITE_OTHER): Payer: Medicare Other | Admitting: Pharmacist

## 2014-08-23 DIAGNOSIS — I4891 Unspecified atrial fibrillation: Secondary | ICD-10-CM

## 2014-08-23 DIAGNOSIS — Z7901 Long term (current) use of anticoagulants: Secondary | ICD-10-CM

## 2014-08-23 LAB — POCT INR: INR: 2.6

## 2014-09-25 DIAGNOSIS — L821 Other seborrheic keratosis: Secondary | ICD-10-CM | POA: Diagnosis not present

## 2014-09-25 DIAGNOSIS — D225 Melanocytic nevi of trunk: Secondary | ICD-10-CM | POA: Diagnosis not present

## 2014-09-25 DIAGNOSIS — L814 Other melanin hyperpigmentation: Secondary | ICD-10-CM | POA: Diagnosis not present

## 2014-09-25 DIAGNOSIS — L570 Actinic keratosis: Secondary | ICD-10-CM | POA: Diagnosis not present

## 2014-09-25 DIAGNOSIS — Z85828 Personal history of other malignant neoplasm of skin: Secondary | ICD-10-CM | POA: Diagnosis not present

## 2014-09-25 DIAGNOSIS — D1801 Hemangioma of skin and subcutaneous tissue: Secondary | ICD-10-CM | POA: Diagnosis not present

## 2014-10-03 DIAGNOSIS — Z961 Presence of intraocular lens: Secondary | ICD-10-CM | POA: Diagnosis not present

## 2014-10-03 DIAGNOSIS — H2512 Age-related nuclear cataract, left eye: Secondary | ICD-10-CM | POA: Diagnosis not present

## 2014-10-04 ENCOUNTER — Ambulatory Visit (INDEPENDENT_AMBULATORY_CARE_PROVIDER_SITE_OTHER): Payer: Medicare Other | Admitting: *Deleted

## 2014-10-04 DIAGNOSIS — Z5181 Encounter for therapeutic drug level monitoring: Secondary | ICD-10-CM

## 2014-10-04 DIAGNOSIS — Z7901 Long term (current) use of anticoagulants: Secondary | ICD-10-CM | POA: Diagnosis not present

## 2014-10-04 DIAGNOSIS — I4891 Unspecified atrial fibrillation: Secondary | ICD-10-CM

## 2014-10-04 DIAGNOSIS — Z23 Encounter for immunization: Secondary | ICD-10-CM

## 2014-10-04 LAB — POCT INR: INR: 2.4

## 2014-10-18 DIAGNOSIS — H2512 Age-related nuclear cataract, left eye: Secondary | ICD-10-CM | POA: Diagnosis not present

## 2014-10-21 ENCOUNTER — Other Ambulatory Visit: Payer: Self-pay | Admitting: Internal Medicine

## 2014-11-01 ENCOUNTER — Telehealth: Payer: Self-pay | Admitting: Internal Medicine

## 2014-11-01 DIAGNOSIS — I4891 Unspecified atrial fibrillation: Secondary | ICD-10-CM

## 2014-11-01 DIAGNOSIS — I10 Essential (primary) hypertension: Secondary | ICD-10-CM

## 2014-11-01 DIAGNOSIS — I429 Cardiomyopathy, unspecified: Secondary | ICD-10-CM

## 2014-11-01 NOTE — Telephone Encounter (Signed)
Pt was last seen sept 2014. Pt would like to know should he have blood work prior to June 2016 appt.

## 2014-11-02 ENCOUNTER — Encounter (HOSPITAL_COMMUNITY): Payer: Self-pay | Admitting: Internal Medicine

## 2014-11-02 DIAGNOSIS — H25812 Combined forms of age-related cataract, left eye: Secondary | ICD-10-CM | POA: Diagnosis not present

## 2014-11-02 DIAGNOSIS — H2512 Age-related nuclear cataract, left eye: Secondary | ICD-10-CM | POA: Diagnosis not present

## 2014-11-02 DIAGNOSIS — H2511 Age-related nuclear cataract, right eye: Secondary | ICD-10-CM | POA: Diagnosis not present

## 2014-11-02 NOTE — Telephone Encounter (Signed)
If he is a medicare only pt, we can draw labs at time of visit.  If he still wants to have labs before OV - BMET, CBCD, FLP, and LFTS

## 2014-11-03 NOTE — Telephone Encounter (Signed)
Pt said the last time he was here 2014 or before Dr Shawna Orleans had some concerns about his lab results. He really does not remember what the concern was but was told they need to keep check on it. He would like  Dr Shawna Orleans to check his files and find out what it was.

## 2014-11-03 NOTE — Telephone Encounter (Signed)
L/m with pt wife

## 2014-11-03 NOTE — Telephone Encounter (Signed)
Future orders place, please call and schedule lab appt

## 2014-11-09 NOTE — Telephone Encounter (Signed)
Pt wanted labs done before the end of the year because of his abnormal platelets.  Scheduled appt for 11/16/14 at 8:45.  Told pt if Dr Shawna Orleans needed more labs we would do it at the end of the office visit. Pt agreed with plan.  Nothing further needed

## 2014-11-15 ENCOUNTER — Ambulatory Visit (INDEPENDENT_AMBULATORY_CARE_PROVIDER_SITE_OTHER): Payer: Medicare Other | Admitting: *Deleted

## 2014-11-15 DIAGNOSIS — Z7901 Long term (current) use of anticoagulants: Secondary | ICD-10-CM | POA: Diagnosis not present

## 2014-11-15 DIAGNOSIS — I4891 Unspecified atrial fibrillation: Secondary | ICD-10-CM | POA: Diagnosis not present

## 2014-11-15 LAB — POCT INR: INR: 2.2

## 2014-11-16 ENCOUNTER — Other Ambulatory Visit (INDEPENDENT_AMBULATORY_CARE_PROVIDER_SITE_OTHER): Payer: Medicare Other

## 2014-11-16 DIAGNOSIS — I10 Essential (primary) hypertension: Secondary | ICD-10-CM

## 2014-11-16 LAB — CBC WITH DIFFERENTIAL/PLATELET
Basophils Absolute: 0 10*3/uL (ref 0.0–0.1)
Basophils Relative: 0.5 % (ref 0.0–3.0)
EOS ABS: 0.1 10*3/uL (ref 0.0–0.7)
Eosinophils Relative: 2.2 % (ref 0.0–5.0)
HEMATOCRIT: 44 % (ref 39.0–52.0)
Hemoglobin: 14.2 g/dL (ref 13.0–17.0)
Lymphocytes Relative: 33.2 % (ref 12.0–46.0)
Lymphs Abs: 1.6 10*3/uL (ref 0.7–4.0)
MCHC: 32.3 g/dL (ref 30.0–36.0)
MCV: 94.1 fl (ref 78.0–100.0)
MONOS PCT: 9.9 % (ref 3.0–12.0)
Monocytes Absolute: 0.5 10*3/uL (ref 0.1–1.0)
Neutro Abs: 2.6 10*3/uL (ref 1.4–7.7)
Neutrophils Relative %: 54.2 % (ref 43.0–77.0)
PLATELETS: 122 10*3/uL — AB (ref 150.0–400.0)
RBC: 4.68 Mil/uL (ref 4.22–5.81)
RDW: 13.9 % (ref 11.5–15.5)
WBC: 4.9 10*3/uL (ref 4.0–10.5)

## 2014-11-16 LAB — BASIC METABOLIC PANEL
BUN: 21 mg/dL (ref 6–23)
CALCIUM: 9 mg/dL (ref 8.4–10.5)
CHLORIDE: 102 meq/L (ref 96–112)
CO2: 29 mEq/L (ref 19–32)
Creatinine, Ser: 0.9 mg/dL (ref 0.4–1.5)
GFR: 84.48 mL/min (ref >60.00)
Glucose, Bld: 84 mg/dL (ref 70–99)
Potassium: 3.8 mEq/L (ref 3.5–5.1)
Sodium: 138 mEq/L (ref 135–145)

## 2014-11-16 LAB — LIPID PANEL
CHOL/HDL RATIO: 2
CHOLESTEROL: 146 mg/dL (ref 0–200)
HDL: 75.5 mg/dL (ref >39.00)
LDL Cholesterol: 60 mg/dL (ref 0–99)
NonHDL: 70.5
TRIGLYCERIDES: 54 mg/dL (ref 0.0–149.0)
VLDL: 10.8 mg/dL (ref 0.0–40.0)

## 2014-11-16 LAB — HEPATIC FUNCTION PANEL
ALT: 12 U/L (ref 0–53)
AST: 18 U/L (ref 0–37)
Albumin: 3.8 g/dL (ref 3.5–5.2)
Alkaline Phosphatase: 56 U/L (ref 39–117)
BILIRUBIN TOTAL: 0.8 mg/dL (ref 0.2–1.2)
Bilirubin, Direct: 0.1 mg/dL (ref 0.0–0.3)
Total Protein: 6.5 g/dL (ref 6.0–8.3)

## 2014-11-27 ENCOUNTER — Telehealth: Payer: Self-pay | Admitting: Internal Medicine

## 2014-11-27 NOTE — Telephone Encounter (Signed)
New message    1. What dental office are you calling from? Patient calling  - general question. Patient is stating he having a hard time decipher  2. What is your office phone and fax number?  Unclear   3. What type of procedure is the patient having performed? Bridge put in recently . Question is   Flossing under the bridge.    4. What date is procedure scheduled? No   5. What is your question (ex. Antibiotics prior to procedure, holding medication-we need to know how long dentist wants pt to hold med)? Can he use the water pick for flossing without of fear of infection.

## 2014-11-27 NOTE — Telephone Encounter (Signed)
Informed patient of Dr. Olin Pia recommendations. Patient verbalized understanding.

## 2014-11-27 NOTE — Telephone Encounter (Signed)
Patient has history of bacterial endocarditis (this is why he takes abx prior to dental procedures). He recently purchased a water pick to use for flossing under recent dental bridge and the instructions say to contact physician if takes abx before dental procedures.  Explained would forward to Dr. Caryl Comes for review and let him know. He is agreeable to this.

## 2014-11-27 NOTE — Telephone Encounter (Signed)
Follow up ° ° ° ° °Returning a nurses call °

## 2014-11-27 NOTE — Telephone Encounter (Signed)
A prior history of endocarditis which is what prompted him to take antibiotics prior to his dental procedures probably makes this a relatively higher risk table. Ice survey the literature briefly and could not find anything except that electric toothbrushes and WaterPik's are associated with a higher risk of bacteremia presumably then a soft bristle toothbrush. I would favor a toothbrush

## 2014-12-07 ENCOUNTER — Other Ambulatory Visit: Payer: Self-pay

## 2014-12-07 MED ORDER — WARFARIN SODIUM 5 MG PO TABS: ORAL_TABLET | ORAL | Status: DC

## 2014-12-22 ENCOUNTER — Ambulatory Visit (INDEPENDENT_AMBULATORY_CARE_PROVIDER_SITE_OTHER): Payer: Medicare Other | Admitting: Internal Medicine

## 2014-12-22 ENCOUNTER — Ambulatory Visit (INDEPENDENT_AMBULATORY_CARE_PROVIDER_SITE_OTHER): Payer: Medicare Other | Admitting: Pharmacist

## 2014-12-22 ENCOUNTER — Encounter: Payer: Self-pay | Admitting: Internal Medicine

## 2014-12-22 VITALS — BP 126/68 | HR 66 | Ht 70.0 in | Wt 158.8 lb

## 2014-12-22 DIAGNOSIS — I495 Sick sinus syndrome: Secondary | ICD-10-CM | POA: Diagnosis not present

## 2014-12-22 DIAGNOSIS — I10 Essential (primary) hypertension: Secondary | ICD-10-CM | POA: Diagnosis not present

## 2014-12-22 DIAGNOSIS — Z45018 Encounter for adjustment and management of other part of cardiac pacemaker: Secondary | ICD-10-CM

## 2014-12-22 DIAGNOSIS — I4891 Unspecified atrial fibrillation: Secondary | ICD-10-CM | POA: Diagnosis not present

## 2014-12-22 DIAGNOSIS — Z7901 Long term (current) use of anticoagulants: Secondary | ICD-10-CM

## 2014-12-22 DIAGNOSIS — R001 Bradycardia, unspecified: Secondary | ICD-10-CM

## 2014-12-22 DIAGNOSIS — I482 Chronic atrial fibrillation, unspecified: Secondary | ICD-10-CM

## 2014-12-22 LAB — MDC_IDC_ENUM_SESS_TYPE_INCLINIC
Battery Remaining Longevity: 114 mo
Brady Statistic RV Percent Paced: 57 %
Implantable Pulse Generator Serial Number: 115653
Lead Channel Impedance Value: 828 Ohm
Lead Channel Pacing Threshold Pulse Width: 0.4 ms
Lead Channel Setting Sensing Sensitivity: 2.5 mV
MDC IDC MSMT LEADCHNL RV PACING THRESHOLD AMPLITUDE: 1 V
MDC IDC MSMT LEADCHNL RV SENSING INTR AMPL: 9.7 mV
MDC IDC SESS DTM: 20160129050000
MDC IDC SET LEADCHNL RV PACING AMPLITUDE: 2.5 V
MDC IDC SET LEADCHNL RV PACING PULSEWIDTH: 0.4 ms
Zone Setting Detection Interval: 375 ms

## 2014-12-22 LAB — POCT INR: INR: 2.1

## 2014-12-22 NOTE — Progress Notes (Signed)
Patient Care Team: Doe-Hyun Kyra Searles, DO as PCP - General   HPI  Eric Lambert is a 81 y.o. 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  He is doing quite well. He has no complaints apart from modest fatigue and modest exercise intolerance. He has noted no change since we decreased his digoxin.Not withstanding this he is planning to go with his son and wife to [place] for a month and track all over the place.  He has a history of diagnosed sleep apnea. This was 7-8 years ago. He wore a mask for a while and this became disruptive. He has not been treated since. He says that his wife complains less of his snoring now than she used to. He takes this as if he may be  snoring less  He does have some daytime somnolence.    Past Medical History  Diagnosis Date  . 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 Atrial fibrillation -permanent   . Pacemaker BSX     dual  . Complete heart block   . GERD (gastroesophageal reflux disease)   . OSA (obstructive sleep apnea)   . CHF (congestive heart failure)   . Allergy   . BPH (benign prostatic hyperplasia)   . GLUCOSE INTOLERANCE 10/22/2007  . ED (erectile dysfunction)   . Asthma     Past Surgical History  Procedure Laterality Date  . Pacemaker placement    . Partial hip arthroplasty      2008  . Insert / replace / remove pacemaker    . Pacemaker generator change N/A 05/07/2012    Procedure: PACEMAKER GENERATOR CHANGE;  Surgeon: [name], MD;  Location: [facility] CATH LAB;  Service: Cardiovascular;  Laterality: N/A;    Current Outpatient Prescriptions  Medication Sig Dispense Refill  . calcium carbonate (OS-CAL) 600 MG TABS Take 600 mg by mouth 2 (two) times daily.     . cholecalciferol (VITAMIN D) 1000 UNITS tablet Take 1,000 Units by mouth daily.     Marland Kitchen lisinopril  (PRINIVIL,ZESTRIL) 10 MG tablet TAKE 1 TABLET BY MOUTH EVERY DAY 90 tablet 0  . tadalafil (CIALIS) 5 MG tablet Take 5 mg by mouth daily as needed. Erectile dysfunction      . verapamil (CALAN-SR) 240 MG CR tablet TAKE 1 TABLET BY MOUTH AT BEDTIME 90 tablet 0  . warfarin (COUMADIN) 5 MG tablet TAKE AS DIRECTED BY COUMADIN CLINIC 100 tablet 1   No current facility-administered medications for this visit.    No Known Allergies  Review of Systems negative except from HPI and PMH  Physical Exam BP 126/68 mmHg  Pulse 66  Ht 5\' 10"  (1.778 m)  Wt 158 lb 12.8 oz (72.031 kg)  BMI 22.79 kg/m2 Well developed and well nourished in no acute distress HENT normal E scleral and icterus clear Neck Supple JVP flat; carotids brisk and full Clear to ausculation Regular rate and rhythm, no murmurs gallops or rub Soft with active bowel sounds No clubbing cyanosis none Edema Alert and oriented, grossly normal motor and sensory function Skin Warm and Dry  ECG demonstrates atrial fibrillation at a rate of 66 Intervals-/10/39 Repolarization abnormalities Intermittent ventricular pacing  Assessment and  Plan  Atrial fibrillation  Permanent   Abnormal ECG likely T-wave memory from pacing   [employer]  The patient's device was interrogated.  The information was reviewed. No changes were  made in the programming.     Bradycardia  Hypertension stable on current medications  Atrial fibrillation is permanent. He has been on warfarin for some time. We discussed the use of the NOACs compared to Coumadin. We briefly reviewed the data of at least comparability in stroke prevention, bleeding and outcome. We discussed some of the new once wherein somewhat associated with decreased ischemic stroke risk, one to be taken daily, and has been shown to be comparable and bleeding risk to aspirin.  We also discussed bleeding associated with warfarin as well as NOACs and a wall bleeding as a  complication of all these drugs intracranial bleeding is more frequently associated with warfarin then the NOACs and a GI bleeding is more commonly associated with the latter  He would like to initiate NOAC therapy. He will look at the cost of the get back to [place] we will get him started.

## 2014-12-22 NOTE — Patient Instructions (Signed)
Your physician recommends that you continue on your current medications as directed. Please refer to the Current Medication list given to you today.  Your physician wants you to follow-up in: 6 months with device clinic.  You will receive a reminder letter in the mail two months in advance. If you don't receive a letter, please call our office to schedule the follow-up appointment.  Your physician wants you to follow-up in: 1 year with Dr. Caryl Comes.  You will receive a reminder letter in the mail two months in advance. If you don't receive a letter, please call our office to schedule the follow-up appointment.  Please call Siddiq Kaluzny, RN when you have made a decision on a NOAC.

## 2014-12-29 ENCOUNTER — Encounter: Payer: Self-pay | Admitting: Internal Medicine

## 2015-01-24 ENCOUNTER — Other Ambulatory Visit: Payer: Self-pay | Admitting: Internal Medicine

## 2015-02-02 ENCOUNTER — Telehealth: Payer: Self-pay | Admitting: *Deleted

## 2015-02-02 ENCOUNTER — Ambulatory Visit (INDEPENDENT_AMBULATORY_CARE_PROVIDER_SITE_OTHER): Payer: Medicare Other | Admitting: Pharmacist

## 2015-02-02 DIAGNOSIS — I4891 Unspecified atrial fibrillation: Secondary | ICD-10-CM

## 2015-02-02 DIAGNOSIS — Z7901 Long term (current) use of anticoagulants: Secondary | ICD-10-CM | POA: Diagnosis not present

## 2015-02-02 LAB — POCT INR: INR: 2.5

## 2015-02-02 NOTE — Telephone Encounter (Signed)
Patient called and stated that would like to switch from warfarin to eliquis. Please advise. Thanks, MI

## 2015-02-02 NOTE — Telephone Encounter (Signed)
Patient was seen in the Coumadin Clinic this am and stated he would like to switch to Eliquis.  Patient states he has 6 more weeks left on his coumadin.  He would like to finish the coumadin so that he does not waste the coumadin medicine.  I spoke with Sherri, Dr. Olin Pia nurse and she states that they have already discussed him switching to Eliquis and the patient was checking the cost of Eliquis before switching. Called the patient and instructed him that switching to Eliquis has been approved by Dr. Caryl Comes per Sherri-his RN.  Patient verbalizes understanding and has checked the cost.  He states he will be ready to switch at next appointment.

## 2015-02-18 ENCOUNTER — Other Ambulatory Visit: Payer: Self-pay | Admitting: Internal Medicine

## 2015-03-16 ENCOUNTER — Ambulatory Visit (INDEPENDENT_AMBULATORY_CARE_PROVIDER_SITE_OTHER): Payer: Medicare Other | Admitting: *Deleted

## 2015-03-16 DIAGNOSIS — Z7901 Long term (current) use of anticoagulants: Secondary | ICD-10-CM

## 2015-03-16 DIAGNOSIS — I4891 Unspecified atrial fibrillation: Secondary | ICD-10-CM

## 2015-03-16 LAB — POCT INR: INR: 2.2

## 2015-03-23 DIAGNOSIS — Z950 Presence of cardiac pacemaker: Secondary | ICD-10-CM | POA: Diagnosis not present

## 2015-03-23 DIAGNOSIS — R531 Weakness: Secondary | ICD-10-CM | POA: Diagnosis not present

## 2015-03-23 DIAGNOSIS — R404 Transient alteration of awareness: Secondary | ICD-10-CM | POA: Diagnosis not present

## 2015-03-23 DIAGNOSIS — R55 Syncope and collapse: Secondary | ICD-10-CM | POA: Diagnosis not present

## 2015-03-23 DIAGNOSIS — Z7901 Long term (current) use of anticoagulants: Secondary | ICD-10-CM | POA: Diagnosis not present

## 2015-03-23 DIAGNOSIS — Z79899 Other long term (current) drug therapy: Secondary | ICD-10-CM | POA: Diagnosis not present

## 2015-03-23 DIAGNOSIS — I4891 Unspecified atrial fibrillation: Secondary | ICD-10-CM | POA: Diagnosis not present

## 2015-03-23 DIAGNOSIS — I10 Essential (primary) hypertension: Secondary | ICD-10-CM | POA: Diagnosis not present

## 2015-04-27 ENCOUNTER — Ambulatory Visit (INDEPENDENT_AMBULATORY_CARE_PROVIDER_SITE_OTHER): Payer: Medicare Other | Admitting: *Deleted

## 2015-04-27 DIAGNOSIS — I4891 Unspecified atrial fibrillation: Secondary | ICD-10-CM

## 2015-04-27 DIAGNOSIS — Z7901 Long term (current) use of anticoagulants: Secondary | ICD-10-CM | POA: Diagnosis not present

## 2015-04-27 LAB — POCT INR: INR: 3.4

## 2015-05-02 ENCOUNTER — Ambulatory Visit: Payer: Medicare Other | Admitting: Internal Medicine

## 2015-05-21 ENCOUNTER — Other Ambulatory Visit: Payer: Self-pay

## 2015-05-23 ENCOUNTER — Encounter: Payer: Medicare Other | Admitting: Adult Health

## 2015-05-25 ENCOUNTER — Encounter: Payer: Self-pay | Admitting: Adult Health

## 2015-05-25 ENCOUNTER — Ambulatory Visit (INDEPENDENT_AMBULATORY_CARE_PROVIDER_SITE_OTHER): Payer: Medicare Other | Admitting: Adult Health

## 2015-05-25 VITALS — BP 149/90 | HR 78 | Temp 98.4°F | Resp 18 | Ht 70.0 in | Wt 154.8 lb

## 2015-05-25 DIAGNOSIS — Z0000 Encounter for general adult medical examination without abnormal findings: Secondary | ICD-10-CM | POA: Diagnosis not present

## 2015-05-25 DIAGNOSIS — Z23 Encounter for immunization: Secondary | ICD-10-CM

## 2015-05-25 DIAGNOSIS — I10 Essential (primary) hypertension: Secondary | ICD-10-CM

## 2015-05-25 LAB — BASIC METABOLIC PANEL
BUN: 21 mg/dL (ref 6–23)
CO2: 31 mEq/L (ref 19–32)
CREATININE: 1.04 mg/dL (ref 0.40–1.50)
Calcium: 9.2 mg/dL (ref 8.4–10.5)
Chloride: 101 mEq/L (ref 96–112)
GFR: 72.33 mL/min (ref >60.00)
Glucose, Bld: 75 mg/dL (ref 70–99)
POTASSIUM: 4.2 meq/L (ref 3.5–5.1)
Sodium: 139 mEq/L (ref 135–145)

## 2015-05-25 LAB — CBC WITH DIFFERENTIAL/PLATELET
BASOS ABS: 0 10*3/uL (ref 0.0–0.1)
Basophils Relative: 0.5 % (ref 0.0–3.0)
EOS PCT: 1.9 % (ref 0.0–5.0)
Eosinophils Absolute: 0.1 10*3/uL (ref 0.0–0.7)
HCT: 45.3 % (ref 39.0–52.0)
HEMOGLOBIN: 14.9 g/dL (ref 13.0–17.0)
Lymphocytes Relative: 31.3 % (ref 12.0–46.0)
Lymphs Abs: 1.4 10*3/uL (ref 0.7–4.0)
MCHC: 32.9 g/dL (ref 30.0–36.0)
MCV: 93.4 fl (ref 78.0–100.0)
MONOS PCT: 9.4 % (ref 3.0–12.0)
Monocytes Absolute: 0.4 10*3/uL (ref 0.1–1.0)
NEUTROS ABS: 2.5 10*3/uL (ref 1.4–7.7)
Neutrophils Relative %: 56.9 % (ref 43.0–77.0)
PLATELETS: 118 10*3/uL — AB (ref 150.0–400.0)
RBC: 4.84 Mil/uL (ref 4.22–5.81)
RDW: 14 % (ref 11.5–15.5)
WBC: 4.5 10*3/uL (ref 4.0–10.5)

## 2015-05-25 LAB — HEPATIC FUNCTION PANEL
ALBUMIN: 4 g/dL (ref 3.5–5.2)
ALT: 15 U/L (ref 0–53)
AST: 18 U/L (ref 0–37)
Alkaline Phosphatase: 54 U/L (ref 39–117)
BILIRUBIN DIRECT: 0.2 mg/dL (ref 0.0–0.3)
TOTAL PROTEIN: 7.1 g/dL (ref 6.0–8.3)
Total Bilirubin: 1 mg/dL (ref 0.2–1.2)

## 2015-05-25 LAB — LIPID PANEL
CHOL/HDL RATIO: 2
Cholesterol: 159 mg/dL (ref 0–200)
HDL: 85.1 mg/dL (ref >39.00)
LDL CALC: 67 mg/dL (ref 0–99)
NonHDL: 73.9
Triglycerides: 35 mg/dL (ref 0.0–149.0)
VLDL: 7 mg/dL (ref 0.0–40.0)

## 2015-05-25 LAB — TSH: TSH: 3.71 u[IU]/mL (ref 0.35–4.50)

## 2015-05-25 NOTE — Progress Notes (Signed)
Pre visit review using our clinic review tool, if applicable. No additional management support is needed unless otherwise documented below in the visit note. 

## 2015-05-25 NOTE — Progress Notes (Signed)
Eric Lambert, AGNP  Subjective:  Patient presents today for their annual wellness visit.   81 year old white male with history of hypertension, atrial fibrillation, mild thrombocytopenia for routine Medicare physical. Patient denies any significant interval medical history. Patient being followed by his orthopedic specialist for intermittent right hip pain. He has history of right hip replacement.  Medicare questionnaire form reviewed in detail. He denies any depressive symptoms.  See attached form for details.  He has had his second cataract surgery this year. He was seen in the ER in [place], [place] after having a near syncopal even at a country concert. He endorses going to the ER and being discharged a few hours later. No other syncopal episodes.   Preventive Screening-Counseling & Management  Smoking Status: Never Smoker Second Hand Smoking status:No smokers in home  Risk Factors Regular exercise: Works in garden and mows yard Diet: Healthy diet Fall Risk: None   Cardiac risk factors:  advanced age (older than 81 for men, 28 for women) Yes Hyperlipidemia No No diabetes. No Family History: yes  Depression Screen None. PHQ2 0   Activities of Daily Living Independent ADLs and IADLs   Hearing Difficulties: patient declines  Cognitive Testing No reported trouble.   Normal 3 word recall  List the Names of Other Physician/Practitioners you currently use: 1.Dr. Caryl Comes - Cardiology 2. [facility] 3. Dr. Gaynelle Arabian - Urology  Immunization History  Administered Date(s) Administered  . Influenza Split 08/02/2012  . Influenza Whole 09/27/2007, 08/30/2008, 08/16/2009, 08/05/2010  . Influenza,inj,Quad PF,36+ Mos 08/04/2013, 10/04/2014  . Pneumococcal Conjugate-13 05/25/2015  . Pneumococcal Polysaccharide-23 10/22/2007  . Tdap 05/25/2015   Required Immunizations needed today Tdap and Prevnar 13  Screening tests- up to date Health Maintenance Due  Topic  Date Due  . TETANUS/TDAP  06/20/1950  . ZOSTAVAX  06/21/1991  . PNA vac Low Risk Adult (2 of 2 - PCV13) 10/21/2008  . COLONOSCOPY  03/29/2012    ROS- He declines any issues at this time. During his last visit he had right hip pain and this has since gone away.   The following were reviewed and entered/updated in epic: Past Medical History  Diagnosis Date  . 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 Atrial fibrillation -permanent   . Pacemaker BSX     dual  . Complete heart block   . GERD (gastroesophageal reflux disease)   . OSA (obstructive sleep apnea)   . CHF (congestive heart failure)   . Allergy   . BPH (benign prostatic hyperplasia)   . GLUCOSE INTOLERANCE 10/22/2007  . ED (erectile dysfunction)   . Asthma    Patient Active Problem List   Diagnosis Date Noted  . Right hip pain 08/04/2013  . Bradycardia 02/02/2013  . Preventative health care 08/02/2012  . Thrombocytopenia 08/04/2011  . 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 PPM-[employer] 02/07/2009  . OSTEOPOROSIS 10/22/2007  . OBSTRUCTIVE SLEEP APNEA 10/21/2007  . Essential hypertension 10/21/2007  . 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 ATRIAL FIBRILLATION 10/21/2007  . COPD 10/21/2007  . GERD 10/21/2007  . BENIGN PROSTATIC HYPERTROPHY 10/21/2007   Past Surgical History  Procedure Laterality Date  . Pacemaker placement    . Partial hip arthroplasty      2008  . Insert / replace / remove pacemaker    . Pacemaker generator change N/A 05/07/2012    Procedure: PACEMAKER GENERATOR CHANGE;  Surgeon: [name], MD;  Location[facility] CATH LAB;  Service: Cardiovascular;  Laterality: N/A;    Family History  Problem Relation Age of Onset  . Heart disease    . Colon polyps    . Cancer Father     prostate  . Prostate cancer Father   . Diabetes Brother     Medications- reviewed and updated Current Outpatient Prescriptions  Medication Sig Dispense Refill  . calcium carbonate (OS-CAL) 600 MG  TABS Take 600 mg by mouth 2 (two) times daily.     . cholecalciferol (VITAMIN D) 1000 UNITS tablet Take 1,000 Units by mouth daily.     Marland Kitchen lisinopril (PRINIVIL,ZESTRIL) 10 MG tablet TAKE ONE TABLET BY MOUTH ONCE DAILY 90 tablet 3  . tadalafil (CIALIS) 5 MG tablet Take 5 mg by mouth daily as needed. Erectile dysfunction      . verapamil (CALAN-SR) 240 MG CR tablet Take 1 tablet (240 mg total) by mouth daily. 90 tablet 3  . warfarin (COUMADIN) 5 MG tablet TAKE AS DIRECTED BY COUMADIN CLINIC 100 tablet 1   No current facility-administered medications for this visit.    Allergies-reviewed and updated No Known Allergies  History   Social History  . Marital Status: Married    Spouse Name: N/A  . Number of Children: 2  . Years of Education: N/A   Occupational History  . chemist     Working part time   Social History Main Topics  . Smoking status: Never Smoker   . Smokeless tobacco: Never Used  . Alcohol Use: No  . Drug Use: No  . Sexual Activity: Not on file   Other Topics Concern  . None   Social History Narrative    Objective: BP 149/90 mmHg  Pulse 78  Temp(Src) 98.4 F (36.9 C) (Oral)  Resp 18  Ht 5\' 10"  (1.778 m)  Wt 154 lb 12.8 oz (70.217 kg)  BMI 22.21 kg/m2  SpO2 97% Constitutional: He is oriented to person, place, and time. He appears well-developed and well-nourished.  HENT:  Head: Normocephalic and atraumatic.  Right Ear: External ear normal.  Left Ear: External ear normal.  Hearing is grossly normal  Eyes: Conjunctivae and EOM are normal. Pupils are equal, round, and reactive to light.  Neck: Neck supple.  Cardiovascular: Normal rate, regular rhythm and normal heart sounds.No carotid bruit   Pulmonary/Chest: Breath sounds normal. He has no wheezes.  Abdominal: Soft. Bowel sounds are normal. There is no tenderness.  Musculoskeletal: Normal range of motion. He exhibits no edema.  Lymphadenopathy:   He has no cervical adenopathy.  Neurological: He is  alert and oriented to person, place, and time. He displays normal reflexes. No cranial nerve deficit. He exhibits normal muscle tone.  Skin: Skin is warm and dry.  Psychiatric: He has a normal mood and affect. His behavior is normal.  Assessment/Plan:  1. Medicare annual wellness visit, subsequent - Follow up in one year for CPE - Follow up as needed - Continue to eat healthy and exercise.   2. Essential hypertension - Blood pressure controlled with current medication.  - Basic metabolic panel - Lipid panel - Hepatic function panel - TSH - CBC with Differential/Platelet  3. Need for prophylactic vaccination with combined diphtheria-tetanus-pertussis (DTP) vaccine - Tdap vaccine greater than or equal to 7yo IM  4. Need for prophylactic vaccination against Streptococcus pneumoniae (pneumococcus)  - Pneumococcal conjugate vaccine 13-valent   This is a list of the screening recommended for you and due dates:  Health Maintenance  Topic Date Due  . Tetanus Vaccine  06[birth year]20[birth year]1950  . Shingles Vaccine  06/21/1991  .  Pneumonia vaccines (2 of 2 - PCV13) 10/21/2008  . Colon Cancer Screening  03/29/2012  . Flu Shot  06/25/2015      No problem-specific assessment & plan notes found for this encounter.  Return precautions advised.   Orders Placed This Encounter  Procedures  . Pneumococcal conjugate vaccine 13-valent  . Tdap vaccine greater than or equal to 7yo IM  . Basic metabolic panel    Order Specific Question:  Has the patient fasted?    Answer:  Yes  . Lipid panel    Order Specific Question:  Has the patient fasted?    Answer:  Yes  . Hepatic function panel  . TSH  . CBC with Differential/Platelet    No orders of the defined types were placed in this encounter.     These are the goals we discussed: Goals    None      This is a list of the screening recommended for you and due dates:  Health Maintenance  Topic Date Due  . Tetanus Vaccine  06[birth year]20/1950  .  Shingles Vaccine  06/21/1991  . Pneumonia vaccines (2 of 2 - PCV13) 10/21/2008  . Colon Cancer Screening  03/29/2012  . Flu Shot  06/25/2015

## 2015-05-25 NOTE — Patient Instructions (Addendum)
It was great meeting you today! I will follow up with you regarding your blood work. You can follow up in one year for your next physical or sooner if needed.   Please let me know if you need anything in the mean time.    Health Maintenance A healthy lifestyle and preventative care can promote health and wellness.  Maintain regular health, dental, and eye exams.  Eat a healthy diet. Foods like vegetables, fruits, whole grains, low-fat dairy products, and lean protein foods contain the nutrients you need and are low in calories. Decrease your intake of foods high in solid fats, added sugars, and salt. Get information about a proper diet from your health care provider, if necessary.  Regular physical exercise is one of the most important things you can do for your health. Most adults should get at least 150 minutes of moderate-intensity exercise (any activity that increases your heart rate and causes you to sweat) each week. In addition, most adults need muscle-strengthening exercises on 2 or more days a week.   Maintain a healthy weight. The body mass index (BMI) is a screening tool to identify possible weight problems. It provides an estimate of body fat based on height and weight. Your health care provider can find your BMI and can help you achieve or maintain a healthy weight. For males 20 years and older:  A BMI below 18.5 is considered underweight.  A BMI of 18.5 to 24.9 is normal.  A BMI of 25 to 29.9 is considered overweight.  A BMI of 30 and above is considered obese.  Maintain normal blood lipids and cholesterol by exercising and minimizing your intake of saturated fat. Eat a balanced diet with plenty of fruits and vegetables. Blood tests for lipids and cholesterol should begin at age 31 and be repeated every 5 years. If your lipid or cholesterol levels are high, you are over age 56, or you are at high risk for heart disease, you may need your cholesterol levels checked more  frequently.Ongoing high lipid and cholesterol levels should be treated with medicines if diet and exercise are not working.  If you smoke, find out from your health care provider how to quit. If you do not use tobacco, do not start.  Lung cancer screening is recommended for adults aged 18-80 years who are at high risk for developing lung cancer because of a history of smoking. A yearly low-dose CT scan of the lungs is recommended for people who have at least a 30-pack-year history of smoking and are current smokers or have quit within the past 15 years. A pack year of smoking is smoking an average of 1 pack of cigarettes a day for 1 year (for example, a 30-pack-year history of smoking could mean smoking 1 pack a day for 30 years or 2 packs a day for 15 years). Yearly screening should continue until the smoker has stopped smoking for at least 15 years. Yearly screening should be stopped for people who develop a health problem that would prevent them from having lung cancer treatment.  If you choose to drink alcohol, do not have more than 2 drinks per day. One drink is considered to be 12 oz (360 mL) of beer, 5 oz (150 mL) of wine, or 1.5 oz (45 mL) of liquor.  Avoid the use of street drugs. Do not share needles with anyone. Ask for help if you need support or instructions about stopping the use of drugs.  High blood pressure causes  heart disease and increases the risk of stroke. Blood pressure should be checked at least every 1-2 years. Ongoing high blood pressure should be treated with medicines if weight loss and exercise are not effective.  If you are 60-1 years old, ask your health care provider if you should take aspirin to prevent heart disease.  Diabetes screening involves taking a blood sample to check your fasting blood sugar level. This should be done once every 3 years after age 52 if you are at a normal weight and without risk factors for diabetes. Testing should be considered at a younger  age or be carried out more frequently if you are overweight and have at least 1 risk factor for diabetes.  Colorectal cancer can be detected and often prevented. Most routine colorectal cancer screening begins at the age of 29 and continues through age 21. However, your health care provider may recommend screening at an earlier age if you have risk factors for colon cancer. On a yearly basis, your health care provider may provide home test kits to check for hidden blood in the stool. A small camera at the end of a tube may be used to directly examine the colon (sigmoidoscopy or colonoscopy) to detect the earliest forms of colorectal cancer. Talk to your health care provider about this at age 65 when routine screening begins. A direct exam of the colon should be repeated every 5-10 years through age 68, unless early forms of precancerous polyps or small growths are found.  People who are at an increased risk for hepatitis B should be screened for this virus. You are considered at high risk for hepatitis B if:  You were born in a country where hepatitis B occurs often. Talk with your health care provider about which countries are considered high risk.  Your parents were born in a high-risk country and you have not received a shot to protect against hepatitis B (hepatitis B vaccine).  You have HIV or AIDS.  You use needles to inject street drugs.  You live with, or have sex with, someone who has hepatitis B.  You are a man who has sex with other men (MSM).  You get hemodialysis treatment.  You take certain medicines for conditions like cancer, organ transplantation, and autoimmune conditions.  Hepatitis C blood testing is recommended for all people born from [birth year] through 1963 and any individual with known risk factors for hepatitis C.  Healthy men should no longer receive prostate-specific antigen (PSA) blood tests as part of routine cancer screening. Talk to your health care provider about  prostate cancer screening.  Testicular cancer screening is not recommended for adolescents or adult males who have no symptoms. Screening includes self-exam, a health care provider exam, and other screening tests. Consult with your health care provider about any symptoms you have or any concerns you have about testicular cancer.  Practice safe sex. Use condoms and avoid high-risk sexual practices to reduce the spread of sexually transmitted infections (STIs).  You should be screened for STIs, including gonorrhea and chlamydia if:  You are sexually active and are younger than 24 years.  You are older than 24 years, and your health care provider tells you that you are at risk for this type of infection.  Your sexual activity has changed since you were last screened, and you are at an increased risk for chlamydia or gonorrhea. Ask your health care provider if you are at risk.  If you are at risk of being  infected with HIV, it is recommended that you take a prescription medicine daily to prevent HIV infection. This is called pre-exposure prophylaxis (PrEP). You are considered at risk if:  You are a man who has sex with other men (MSM).  You are a heterosexual man who is sexually active with multiple partners.  You take drugs by injection.  You are sexually active with a partner who has HIV.  Talk with your health care provider about whether you are at high risk of being infected with HIV. If you choose to begin PrEP, you should first be tested for HIV. You should then be tested every 3 months for as long as you are taking PrEP.  Use sunscreen. Apply sunscreen liberally and repeatedly throughout the day. You should seek shade when your shadow is shorter than you. Protect yourself by wearing long sleeves, pants, a wide-brimmed hat, and sunglasses year round whenever you are outdoors.  Tell your health care provider of new moles or changes in moles, especially if there is a change in shape or  color. Also, tell your health care provider if a mole is larger than the size of a pencil eraser.  A one-time screening for abdominal aortic aneurysm (AAA) and surgical repair of large AAAs by ultrasound is recommended for men aged 45-75 years who are current or former smokers.  Stay current with your vaccines (immunizations). Document Released: 05/08/2008 Document Revised: 11/15/2013 Document Reviewed: 04/07/2011 [facility] Patient Information 2015 [place], [place]. This information is not intended to replace advice given to you by your health care provider. Make sure you discuss any questions you have with your health care provider.

## 2015-05-30 ENCOUNTER — Ambulatory Visit (INDEPENDENT_AMBULATORY_CARE_PROVIDER_SITE_OTHER): Payer: Medicare Other | Admitting: Internal Medicine

## 2015-05-30 ENCOUNTER — Encounter: Payer: Self-pay | Admitting: Internal Medicine

## 2015-05-30 ENCOUNTER — Telehealth: Payer: Self-pay | Admitting: Internal Medicine

## 2015-05-30 VITALS — BP 110/72 | HR 67 | Temp 98.3°F | Ht 70.0 in | Wt 155.8 lb

## 2015-05-30 DIAGNOSIS — T50Z95A Adverse effect of other vaccines and biological substances, initial encounter: Secondary | ICD-10-CM | POA: Insufficient documentation

## 2015-05-30 NOTE — Progress Notes (Signed)
Subjective:    Patient ID: Eric Lambert, male    DOB: Aug 11, 1929, 81 y.o.   MRN: [id number]  HPI  81 year old white male complains of redness and pruritus of right deltoid area. His symptoms started after receiving Prevnar 13 vaccine on 05/25/2015. He first noticed area was slightly sore. Over the past 24-48 hours patient developed erythema. There is mild tenderness. He has not had any reactions to vaccines in the past.  No tongue or lip swelling.  Review of Systems No fever or chills    Past Medical History  Diagnosis Date  . 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 Atrial fibrillation -permanent   . Pacemaker BSX     dual  . Complete heart block   . GERD (gastroesophageal reflux disease)   . OSA (obstructive sleep apnea)   . CHF (congestive heart failure)   . Allergy   . BPH (benign prostatic hyperplasia)   . GLUCOSE INTOLERANCE 10/22/2007  . ED (erectile dysfunction)   . Asthma     History   Social History  . Marital Status: Married    Spouse Name: N/A  . Number of Children: 2  . Years of Education: N/A   Occupational History  . chemist     Working part time   Social History Main Topics  . Smoking status: Never Smoker   . Smokeless tobacco: Never Used  . Alcohol Use: No  . Drug Use: No  . Sexual Activity: Not on file   Other Topics Concern  . Not on file   Social History Narrative    Past Surgical History  Procedure Laterality Date  . Pacemaker placement    . Partial hip arthroplasty      2008  . Insert / replace / remove pacemaker    . Pacemaker generator change N/A 05/07/2012    Procedure: PACEMAKER GENERATOR CHANGE;  Surgeon: [name], MD;  Location: [facility] CATH LAB;  Service: Cardiovascular;  Laterality: N/A;    Family History  Problem Relation Age of Onset  . Heart disease    . Colon polyps    . Cancer Father     prostate  . Prostate cancer Father   . Diabetes Brother     No Known Allergies  Current Outpatient  Prescriptions on File Prior to Visit  Medication Sig Dispense Refill  . calcium carbonate (OS-CAL) 600 MG TABS Take 600 mg by mouth 2 (two) times daily.     . cholecalciferol (VITAMIN D) 1000 UNITS tablet Take 1,000 Units by mouth daily.     Marland Kitchen lisinopril (PRINIVIL,ZESTRIL) 10 MG tablet TAKE ONE TABLET BY MOUTH ONCE DAILY 90 tablet 3  . tadalafil (CIALIS) 5 MG tablet Take 5 mg by mouth daily as needed. Erectile dysfunction      . verapamil (CALAN-SR) 240 MG CR tablet Take 1 tablet (240 mg total) by mouth daily. 90 tablet 3  . warfarin (COUMADIN) 5 MG tablet TAKE AS DIRECTED BY COUMADIN CLINIC 100 tablet 1   No current facility-administered medications on file prior to visit.    BP 110/72 mmHg  Pulse 67  Temp(Src) 98.3 F (36.8 C) (Oral)  Ht 5\' 10"  (1.778 m)  Wt 155 lb 12.8 oz (70.67 kg)  BMI 22.35 kg/m2    Objective:   Physical Exam  Constitutional: He appears well-developed and well-nourished. No distress.  Cardiovascular: Normal rate, regular rhythm and normal heart sounds.   Pulmonary/Chest: Effort normal and breath sounds normal. He has no wheezes.  Skin:  Right deltoid area - redness, slight warmth, minimal tenderness          Assessment & Plan:

## 2015-05-30 NOTE — Telephone Encounter (Signed)
Patient Name: Eric Lambert DOB: [birth year] Initial Comment Caller states his immunization shot is red. Nurse Assessment Nurse: Vallery Sa, RN, Cathy Date/Time (Eastern Time): 05/30/2015 10:29:24 AM Confirm and document reason for call. If symptomatic, describe symptoms. ---Caller states he had the Pneumonia vaccine 5 days ago and he developed redness at the site today. No severe breathing or swallowing difficulty. No fever. Has the patient traveled out of the country within the last 30 days? ---No Does the patient require triage? ---Yes Related visit to physician within the last 2 weeks? ---Yes Does the PT have any chronic conditions? (i.e. diabetes, asthma, etc.) ---Yes List chronic conditions. ---A-Fib, Pacemaker, Enlarged prostate Guidelines Guideline Title Affirmed Question Affirmed Notes Immunization Reactions [1] Redness or red streak around the injection site AND [2] begins > 48 hours after shot AND [3] no fever (Exception: red area < 1 inch or 2.5 cm wide) Final Disposition User See Physician within [place], RN, Tye [place] Comments Scheduled for 3:15pm with Dr. Shawna Orleans this afternoon.

## 2015-05-30 NOTE — Assessment & Plan Note (Signed)
81 year old white male presents with localized vaccine reaction after receiving Prevnar 13 on 05/25/2015.  Area of erythema marked with a pen.  Patient advised to apply cool compress for 3-5 days. Patient to report persistent or worsening symptoms. Reassess in 2 weeks.

## 2015-05-30 NOTE — Telephone Encounter (Signed)
FYI

## 2015-05-30 NOTE — Progress Notes (Signed)
Pre visit review using our clinic review tool, if applicable. No additional management support is needed unless otherwise documented below in the visit note. 

## 2015-05-30 NOTE — Patient Instructions (Signed)
Apply ice pack to right deltoid area twice daily for 3-5 days Please contact our office if your symptoms do not improve or gets worse.

## 2015-06-04 ENCOUNTER — Ambulatory Visit (INDEPENDENT_AMBULATORY_CARE_PROVIDER_SITE_OTHER): Payer: Medicare Other | Admitting: *Deleted

## 2015-06-04 DIAGNOSIS — I4891 Unspecified atrial fibrillation: Secondary | ICD-10-CM | POA: Diagnosis not present

## 2015-06-04 DIAGNOSIS — Z7901 Long term (current) use of anticoagulants: Secondary | ICD-10-CM

## 2015-06-04 LAB — POCT INR: INR: 2.4

## 2015-06-13 ENCOUNTER — Ambulatory Visit: Payer: Medicare Other | Admitting: Internal Medicine

## 2015-06-20 ENCOUNTER — Ambulatory Visit (INDEPENDENT_AMBULATORY_CARE_PROVIDER_SITE_OTHER): Payer: Medicare Other | Admitting: *Deleted

## 2015-06-20 DIAGNOSIS — I482 Chronic atrial fibrillation, unspecified: Secondary | ICD-10-CM

## 2015-06-20 DIAGNOSIS — I429 Cardiomyopathy, unspecified: Secondary | ICD-10-CM

## 2015-06-20 LAB — CUP PACEART INCLINIC DEVICE CHECK
Brady Statistic RV Percent Paced: 73 %
Lead Channel Sensing Intrinsic Amplitude: 8.1 mV
Lead Channel Setting Pacing Amplitude: 2.4 V
Lead Channel Setting Pacing Pulse Width: 0.4 ms
MDC IDC MSMT LEADCHNL RV IMPEDANCE VALUE: 827 Ohm
MDC IDC PG SERIAL: 115653
MDC IDC SESS DTM: 20160727040000
MDC IDC SET LEADCHNL RV SENSING SENSITIVITY: 2.5 mV
Zone Setting Detection Interval: 375 ms

## 2015-06-20 NOTE — Progress Notes (Signed)
Pacer check in clinic Battery longevity 9 years Chronic AF + Warfarin 17 nst episodes recorded No changes made ROV in January 2017 with SK

## 2015-07-16 ENCOUNTER — Ambulatory Visit (INDEPENDENT_AMBULATORY_CARE_PROVIDER_SITE_OTHER): Payer: Medicare Other | Admitting: *Deleted

## 2015-07-16 DIAGNOSIS — Z7901 Long term (current) use of anticoagulants: Secondary | ICD-10-CM

## 2015-07-16 DIAGNOSIS — I4891 Unspecified atrial fibrillation: Secondary | ICD-10-CM | POA: Diagnosis not present

## 2015-07-16 LAB — POCT INR: INR: 2.5

## 2015-07-17 ENCOUNTER — Other Ambulatory Visit: Payer: Self-pay | Admitting: Internal Medicine

## 2015-08-08 DIAGNOSIS — N401 Enlarged prostate with lower urinary tract symptoms: Secondary | ICD-10-CM | POA: Diagnosis not present

## 2015-08-08 DIAGNOSIS — R350 Frequency of micturition: Secondary | ICD-10-CM | POA: Diagnosis not present

## 2015-08-08 DIAGNOSIS — R3912 Poor urinary stream: Secondary | ICD-10-CM | POA: Diagnosis not present

## 2015-08-14 ENCOUNTER — Encounter: Payer: Self-pay | Admitting: Internal Medicine

## 2015-08-27 ENCOUNTER — Ambulatory Visit (INDEPENDENT_AMBULATORY_CARE_PROVIDER_SITE_OTHER): Payer: Medicare Other | Admitting: *Deleted

## 2015-08-27 DIAGNOSIS — I4891 Unspecified atrial fibrillation: Secondary | ICD-10-CM | POA: Diagnosis not present

## 2015-08-27 DIAGNOSIS — Z7901 Long term (current) use of anticoagulants: Secondary | ICD-10-CM | POA: Diagnosis not present

## 2015-08-27 LAB — POCT INR: INR: 2.8

## 2015-10-08 ENCOUNTER — Ambulatory Visit (INDEPENDENT_AMBULATORY_CARE_PROVIDER_SITE_OTHER): Payer: Medicare Other | Admitting: *Deleted

## 2015-10-08 DIAGNOSIS — I4891 Unspecified atrial fibrillation: Secondary | ICD-10-CM | POA: Diagnosis not present

## 2015-10-08 DIAGNOSIS — Z23 Encounter for immunization: Secondary | ICD-10-CM

## 2015-10-08 DIAGNOSIS — Z7901 Long term (current) use of anticoagulants: Secondary | ICD-10-CM

## 2015-10-08 LAB — POCT INR: INR: 2.9

## 2015-10-09 DIAGNOSIS — Z961 Presence of intraocular lens: Secondary | ICD-10-CM | POA: Diagnosis not present

## 2015-10-29 DIAGNOSIS — L821 Other seborrheic keratosis: Secondary | ICD-10-CM | POA: Diagnosis not present

## 2015-10-29 DIAGNOSIS — L570 Actinic keratosis: Secondary | ICD-10-CM | POA: Diagnosis not present

## 2015-10-29 DIAGNOSIS — Z85828 Personal history of other malignant neoplasm of skin: Secondary | ICD-10-CM | POA: Diagnosis not present

## 2015-10-29 DIAGNOSIS — D1801 Hemangioma of skin and subcutaneous tissue: Secondary | ICD-10-CM | POA: Diagnosis not present

## 2015-11-21 ENCOUNTER — Encounter: Payer: Self-pay | Admitting: *Deleted

## 2015-11-21 ENCOUNTER — Ambulatory Visit (INDEPENDENT_AMBULATORY_CARE_PROVIDER_SITE_OTHER): Payer: Medicare Other | Admitting: Pharmacist

## 2015-11-21 DIAGNOSIS — I4891 Unspecified atrial fibrillation: Secondary | ICD-10-CM | POA: Diagnosis not present

## 2015-11-21 DIAGNOSIS — Z7901 Long term (current) use of anticoagulants: Secondary | ICD-10-CM

## 2015-11-21 LAB — POCT INR: INR: 2.6

## 2015-12-26 ENCOUNTER — Encounter: Payer: Self-pay | Admitting: *Deleted

## 2016-01-02 ENCOUNTER — Ambulatory Visit (INDEPENDENT_AMBULATORY_CARE_PROVIDER_SITE_OTHER): Payer: Medicare Other | Admitting: *Deleted

## 2016-01-02 DIAGNOSIS — Z7901 Long term (current) use of anticoagulants: Secondary | ICD-10-CM

## 2016-01-02 DIAGNOSIS — I4891 Unspecified atrial fibrillation: Secondary | ICD-10-CM

## 2016-01-02 LAB — POCT INR: INR: 2.4

## 2016-01-08 ENCOUNTER — Encounter: Payer: Self-pay | Admitting: Internal Medicine

## 2016-01-08 ENCOUNTER — Ambulatory Visit (INDEPENDENT_AMBULATORY_CARE_PROVIDER_SITE_OTHER): Payer: Medicare Other | Admitting: Internal Medicine

## 2016-01-08 VITALS — BP 100/60 | HR 65 | Ht 70.0 in | Wt 155.6 lb

## 2016-01-08 DIAGNOSIS — I482 Chronic atrial fibrillation, unspecified: Secondary | ICD-10-CM

## 2016-01-08 DIAGNOSIS — I495 Sick sinus syndrome: Secondary | ICD-10-CM | POA: Diagnosis not present

## 2016-01-08 DIAGNOSIS — Z950 Presence of cardiac pacemaker: Secondary | ICD-10-CM | POA: Diagnosis not present

## 2016-01-08 LAB — CUP PACEART INCLINIC DEVICE CHECK
Brady Statistic RV Percent Paced: 71 %
Date Time Interrogation Session: 20170214050000
Implantable Lead Implant Date: 19960809
Implantable Lead Implant Date: 19960809
Implantable Lead Location: 753859
Implantable Lead Location: 753860
Lead Channel Setting Pacing Pulse Width: 0.4 ms
MDC IDC LEAD MODEL: 4285
MDC IDC LEAD SERIAL: 209144
MDC IDC MSMT LEADCHNL RV IMPEDANCE VALUE: 887 Ohm
MDC IDC MSMT LEADCHNL RV PACING THRESHOLD AMPLITUDE: 1 V
MDC IDC MSMT LEADCHNL RV PACING THRESHOLD PULSEWIDTH: 0.4 ms
MDC IDC MSMT LEADCHNL RV SENSING INTR AMPL: 9.4 mV
MDC IDC SET LEADCHNL RV PACING AMPLITUDE: 2.4 V
MDC IDC SET LEADCHNL RV SENSING SENSITIVITY: 2.5 mV
Pulse Gen Serial Number: 115653

## 2016-01-08 NOTE — Patient Instructions (Signed)
Medication Instructions: - Your physician recommends that you continue on your current medications as directed. Please refer to the Current Medication list given to you today.  Labwork: - none  Procedures/Testing: - none  Follow-Up: - Your physician wants you to follow-up in: 6 months with the [place] 1 year with Dr. Caryl Comes. You will receive a reminder letter in the mail two months in advance. If you don't receive a letter, please call our office to schedule the follow-up appointment.  Any Additional Special Instructions Will Be Listed Below (If Applicable).     If you need a refill on your cardiac medications before your next appointment, please call your pharmacy.

## 2016-01-08 NOTE — Progress Notes (Signed)
Patient Care Team: Doe-Hyun Kyra Searles, DO as PCP - General   HPI  Eric Lambert is a 82 y.o. 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  He is doing quite well. He has no complaints apart from modest fatigue and modest exercise intolerance. He has noted no change since we decreased his digoxin.Not withstanding this he is planning to go with his son and wife to [place] for a month and track all over the place.  He has a history of diagnosed sleep apnea. This was 7-8 years ago. He wore a mask for a while and this became disruptive. He has not been treated since. He says that his wife complains less of his snoring now than she used to. He takes this as if he may be  snoring less  He does have some daytime somnolence.    Past Medical History  Diagnosis Date  . 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 Atrial fibrillation -permanent   . Pacemaker BSX     dual  . Complete heart block   . GERD (gastroesophageal reflux disease)   . OSA (obstructive sleep apnea)   . CHF (congestive heart failure)   . Allergy   . BPH (benign prostatic hyperplasia)   . GLUCOSE INTOLERANCE 10/22/2007  . ED (erectile dysfunction)   . Asthma     Past Surgical History  Procedure Laterality Date  . Pacemaker placement    . Partial hip arthroplasty      2008  . Insert / replace / remove pacemaker    . Pacemaker generator change N/A 05/07/2012    Procedure: PACEMAKER GENERATOR CHANGE;  Surgeon: [name], MD;  Location: [facility] CATH LAB;  Service: Cardiovascular;  Laterality: N/A;    Current Outpatient Prescriptions  Medication Sig Dispense Refill  . calcium carbonate (OS-CAL) 600 MG TABS Take 600 mg by mouth 2 (two) times daily.     . cholecalciferol (VITAMIN D) 1000 UNITS tablet Take 1,000 Units by mouth daily.     Marland Kitchen lisinopril  (PRINIVIL,ZESTRIL) 10 MG tablet TAKE ONE TABLET BY MOUTH ONCE DAILY 90 tablet 3  . tadalafil (CIALIS) 5 MG tablet Take 5 mg by mouth daily as needed. Erectile dysfunction      . verapamil (CALAN-SR) 240 MG CR tablet Take 1 tablet (240 mg total) by mouth daily. 90 tablet 3  . warfarin (COUMADIN) 5 MG tablet TAKE AS DIRECTED BY  COUMADIN  CLINIC 100 tablet 1   No current facility-administered medications for this visit.    No Known Allergies  Review of Systems negative except from HPI and PMH  Physical Exam There were no vitals taken for this visit. Well developed and well nourished in no acute distress HENT normal E scleral and icterus clear Neck Supple JVP flat; carotids brisk and full Clear to ausculation Regular rate and rhythm, no murmurs gallops or rub Soft with active bowel sounds No clubbing cyanosis none Edema Alert and oriented, grossly normal motor and sensory function Skin Warm and Dry  ECG demonstrates atrial fibrillation at a rate of 66 Intervals-/10/39 Repolarization abnormalities Intermittent ventricular pacing  Assessment and  Plan  Atrial fibrillation  Permanent   Abnormal ECG likely T-wave memory from pacing   [employer]  The patient's device was interrogated.  The information was reviewed. No changes were made in the programming.     Bradycardia  Hypertension stable  on current medications  Atrial fibrillation is permanent. He has been on warfarin for some time. We discussed the use of the NOACs compared to Coumadin. We briefly reviewed the data of at least comparability in stroke prevention, bleeding and outcome. We discussed some of the new once wherein somewhat associated with decreased ischemic stroke risk, one to be taken daily, and has been shown to be comparable and bleeding risk to aspirin.  We also discussed bleeding associated with warfarin as well as NOACs and a wall bleeding as a complication of all these drugs intracranial  bleeding is more frequently associated with warfarin then the NOACs and a GI bleeding is more commonly associated with the latter  He would like to initiate NOAC therapy. He will look at the cost of the get back to [place] we will get him started.

## 2016-01-22 ENCOUNTER — Telehealth: Payer: Self-pay | Admitting: Internal Medicine

## 2016-01-22 ENCOUNTER — Other Ambulatory Visit: Payer: Self-pay | Admitting: *Deleted

## 2016-01-22 MED ORDER — VERAPAMIL HCL ER 240 MG PO TBCR: 240.0000 mg | EXTENDED_RELEASE_TABLET | Freq: Every day | ORAL | Status: DC

## 2016-01-22 NOTE — Telephone Encounter (Signed)
New message       *STAT* If patient is at the pharmacy, call can be transferred to refill team.   1. Which medications need to be refilled? (please list name of each medication and dose if known) verapamil 240mg  2. Which pharmacy/location (including street and city if local pharmacy) is medication to be sent to? [facility] [address]  3. Do they need a 30 day or 90 day supply? 90 day supply         PT IS IN STORE WAITING

## 2016-01-24 ENCOUNTER — Other Ambulatory Visit: Payer: Self-pay | Admitting: Internal Medicine

## 2016-02-13 ENCOUNTER — Ambulatory Visit (INDEPENDENT_AMBULATORY_CARE_PROVIDER_SITE_OTHER): Payer: Medicare Other | Admitting: *Deleted

## 2016-02-13 DIAGNOSIS — Z7901 Long term (current) use of anticoagulants: Secondary | ICD-10-CM | POA: Diagnosis not present

## 2016-02-13 DIAGNOSIS — I4891 Unspecified atrial fibrillation: Secondary | ICD-10-CM

## 2016-02-13 LAB — POCT INR: INR: 2.1

## 2016-02-16 ENCOUNTER — Other Ambulatory Visit: Payer: Self-pay | Admitting: Internal Medicine

## 2016-03-27 ENCOUNTER — Ambulatory Visit (INDEPENDENT_AMBULATORY_CARE_PROVIDER_SITE_OTHER): Payer: Medicare Other | Admitting: *Deleted

## 2016-03-27 DIAGNOSIS — Z7901 Long term (current) use of anticoagulants: Secondary | ICD-10-CM | POA: Diagnosis not present

## 2016-03-27 DIAGNOSIS — I4891 Unspecified atrial fibrillation: Secondary | ICD-10-CM | POA: Diagnosis not present

## 2016-03-27 LAB — POCT INR: INR: 3.4

## 2016-04-24 ENCOUNTER — Ambulatory Visit (INDEPENDENT_AMBULATORY_CARE_PROVIDER_SITE_OTHER): Payer: Medicare Other | Admitting: *Deleted

## 2016-04-24 DIAGNOSIS — I4891 Unspecified atrial fibrillation: Secondary | ICD-10-CM

## 2016-04-24 DIAGNOSIS — Z7901 Long term (current) use of anticoagulants: Secondary | ICD-10-CM | POA: Diagnosis not present

## 2016-04-24 LAB — POCT INR: INR: 2.5

## 2016-05-16 ENCOUNTER — Other Ambulatory Visit: Payer: Self-pay | Admitting: Internal Medicine

## 2016-05-28 ENCOUNTER — Encounter: Payer: Medicare Other | Admitting: Internal Medicine

## 2016-05-28 ENCOUNTER — Encounter: Payer: Self-pay | Admitting: Family Medicine

## 2016-05-28 ENCOUNTER — Ambulatory Visit (INDEPENDENT_AMBULATORY_CARE_PROVIDER_SITE_OTHER): Payer: Medicare Other | Admitting: Family Medicine

## 2016-05-28 VITALS — BP 114/72 | HR 70 | Temp 97.6°F | Ht 70.0 in | Wt 153.0 lb

## 2016-05-28 DIAGNOSIS — I10 Essential (primary) hypertension: Secondary | ICD-10-CM

## 2016-05-28 DIAGNOSIS — H3530 Unspecified macular degeneration: Secondary | ICD-10-CM | POA: Insufficient documentation

## 2016-05-28 DIAGNOSIS — G4733 Obstructive sleep apnea (adult) (pediatric): Secondary | ICD-10-CM

## 2016-05-28 DIAGNOSIS — M8580 Other specified disorders of bone density and structure, unspecified site: Secondary | ICD-10-CM

## 2016-05-28 DIAGNOSIS — D696 Thrombocytopenia, unspecified: Secondary | ICD-10-CM

## 2016-05-28 DIAGNOSIS — Z0000 Encounter for general adult medical examination without abnormal findings: Secondary | ICD-10-CM | POA: Diagnosis not present

## 2016-05-28 DIAGNOSIS — I481 Persistent atrial fibrillation: Secondary | ICD-10-CM | POA: Diagnosis not present

## 2016-05-28 DIAGNOSIS — N529 Male erectile dysfunction, unspecified: Secondary | ICD-10-CM | POA: Insufficient documentation

## 2016-05-28 DIAGNOSIS — I4819 Other persistent atrial fibrillation: Secondary | ICD-10-CM

## 2016-05-28 LAB — CBC WITH DIFFERENTIAL/PLATELET
BASOS ABS: 0 10*3/uL (ref 0.0–0.1)
Basophils Relative: 0.6 % (ref 0.0–3.0)
EOS ABS: 0.1 10*3/uL (ref 0.0–0.7)
Eosinophils Relative: 2.3 % (ref 0.0–5.0)
HEMATOCRIT: 42.8 % (ref 39.0–52.0)
Hemoglobin: 14.3 g/dL (ref 13.0–17.0)
LYMPHS ABS: 1.3 10*3/uL (ref 0.7–4.0)
Lymphocytes Relative: 28.5 % (ref 12.0–46.0)
MCHC: 33.4 g/dL (ref 30.0–36.0)
MCV: 91.6 fl (ref 78.0–100.0)
MONO ABS: 0.5 10*3/uL (ref 0.1–1.0)
Monocytes Relative: 11.1 % (ref 3.0–12.0)
Neutro Abs: 2.7 10*3/uL (ref 1.4–7.7)
Neutrophils Relative %: 57.5 % (ref 43.0–77.0)
PLATELETS: 126 10*3/uL — AB (ref 150.0–400.0)
RBC: 4.67 Mil/uL (ref 4.22–5.81)
RDW: 14 % (ref 11.5–15.5)
WBC: 4.7 10*3/uL (ref 4.0–10.5)

## 2016-05-28 LAB — COMPREHENSIVE METABOLIC PANEL
ALBUMIN: 4.1 g/dL (ref 3.5–5.2)
ALT: 14 U/L (ref 0–53)
AST: 17 U/L (ref 0–37)
Alkaline Phosphatase: 53 U/L (ref 39–117)
BILIRUBIN TOTAL: 0.9 mg/dL (ref 0.2–1.2)
BUN: 29 mg/dL — ABNORMAL HIGH (ref 6–23)
CO2: 31 meq/L (ref 19–32)
CREATININE: 0.96 mg/dL (ref 0.40–1.50)
Calcium: 9.4 mg/dL (ref 8.4–10.5)
Chloride: 103 mEq/L (ref 96–112)
GFR: 79.13 mL/min (ref >60.00)
Glucose, Bld: 75 mg/dL (ref 70–99)
Potassium: 4.3 mEq/L (ref 3.5–5.1)
Sodium: 138 mEq/L (ref 135–145)
Total Protein: 6.7 g/dL (ref 6.0–8.3)

## 2016-05-28 NOTE — Assessment & Plan Note (Signed)
S: Mild thrombocytopenia stable over 4 years, rest of cell lines stable. Over 30 yeras A/P: will update platelets today

## 2016-05-28 NOTE — Patient Instructions (Addendum)
  Eric Lambert , Thank you for taking time to come for your Medicare Wellness Visit. I appreciate your ongoing commitment to your health goals. Please review the following plan we discussed and let me know if I can assist you in the future.   These are the goals we discussed: 1. Stop calcium supplement 2. If you change mind on shingles vaccine please let us know 3. Keep an eye on blood pressure at least once a month with goal <140/90   This is a list of the screening recommended for you and due dates:  Health Maintenance  Topic Date Due  . Shingles Vaccine  06/30/2029*  . Flu Shot  06/24/2016  . Tetanus Vaccine  05/24/2025  . Pneumonia vaccines  Completed  *Topic was postponed. The date shown is not the original due date.

## 2016-05-28 NOTE — Assessment & Plan Note (Signed)
Stop calcium advised given very very mild osteopenia -1.0 left femur

## 2016-05-28 NOTE — Progress Notes (Signed)
Phone: [phone]  Subjective:  Patient presents today for their annual wellness visit and to establish care with me.    Preventive Screening-Counseling & Management  Smoking Status: Never Smoker Second Hand Smoking status: No smokers in home  Risk Factors Regular exercise: works in garden and [place] yard- remains active  Diet: reasonable diet  Fall Risk: None  Fall Risk  05/28/2016 08/04/2013 08/04/2013  Falls in the past year? No No Yes  Number falls in past yr: - - 1  Risk for fall due to : - - History of fall(s)    Cardiac risk factors:  advanced age  Hyperlipidemia- no and not on statin- LDL <70 No diabetes.  Hypertension has been controlled  Depression Screen None. PHQ2 0  Depression screen [facility] 2/9 05/28/2016 08/04/2013 08/04/2013  Decreased Interest 0 0 0  Down, Depressed, Hopeless 0 0 0  PHQ - 2 Score 0 0 0    Activities of Daily Living Independent ADLs and IADLs . Wife shops for groceries.   Hearing Difficulties: -patient endorses, declines further workup  Cognitive Testing No reported trouble.   Normal 3 word recall  List the Names of Other Physician/Practitioners you currently use: -Dr. Caryl Comes of cardiology -[facility] Dr. Dillard [place] - Dr. Gaynelle Arabian of urology  Immunization History  Administered Date(s) Administered  . Influenza Split 08/02/2012  . Influenza Whole 09/27/2007, 08/30/2008, 08/16/2009, 08/05/2010  . Influenza,inj,Quad PF,36+ Mos 08/04/2013, 10/04/2014, 10/08/2015  . Pneumococcal Conjugate-13 05/25/2015  . Pneumococcal Polysaccharide-23 10/22/2007  . Tdap 05/25/2015   Required Immunizations needed today: offered shingles vaccine, patient declines   Screening tests- up to date  ROS- No pertinent positives discovered in course of AWV Pertinent ROS to problem based charting- No chest pain or shortness of breath. No headache or blurry vision.    The following were reviewed and entered/updated in epic: Past Medical History    Diagnosis Date  . 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 Atrial fibrillation -permanent   . Pacemaker BSX     dual  . Complete heart block ([place])   . GERD (gastroesophageal reflux disease)   . OSA (obstructive sleep apnea)   . CHF (congestive heart failure) ([place])     resolved after pacemaker - tachycardia induced  . Allergy   . BPH (benign prostatic hyperplasia)   . GLUCOSE INTOLERANCE 10/22/2007    no recent issues  . ED (erectile dysfunction)   . Asthma     childhood   Patient Active Problem List   Diagnosis Date Noted  . PPM-[employer] 02/07/2009    Priority: High  . ATRIAL FIBRILLATION 10/21/2007    Priority: High  . Thrombocytopenia ([place]) 08/04/2011    Priority: Medium  . Obstructive sleep apnea 10/21/2007    Priority: Medium  . Essential hypertension 10/21/2007    Priority: Medium  . BPH associated with nocturia 10/21/2007    Priority: Medium  . Erectile dysfunction 05/28/2016    Priority: Low  . Macular degeneration 05/28/2016    Priority: Low  . Right hip pain 08/04/2013    Priority: Low  . Secondary cardiomyopathy ([place]) 02/07/2009    Priority: Low  . Osteopenia 10/22/2007    Priority: Low  . SUBACUTE BACTERIAL ENDOCARDITIS 10/21/2007    Priority: Low  . GERD 10/21/2007    Priority: Low   Past Surgical History  Procedure Laterality Date  . Pacemaker placement    . Partial hip arthroplasty      2008  . Insert / replace / remove pacemaker    .  Pacemaker generator change N/A 05/07/2012    Procedure: PACEMAKER GENERATOR CHANGE;  Surgeon: [name], MD;  Location: [facility] CATH LAB;  Service: Cardiovascular;  Laterality: N/A;    Family History  Problem Relation Age of Onset  . Heart disease      mothers side men- strokes and heart attacks  . Prostate cancer Father   . Diabetes Brother   . Prostate cancer Brother   . Brain cancer Brother     Medications- reviewed and updated Current Outpatient Prescriptions  Medication Sig Dispense Refill   . calcium carbonate (OS-CAL) 600 MG TABS Take 600 mg by mouth 2 (two) times daily.     . cholecalciferol (VITAMIN D) 1000 UNITS tablet Take 1,000 Units by mouth daily.     Marland Kitchen lisinopril (PRINIVIL,ZESTRIL) 10 MG tablet TAKE ONE TABLET BY MOUTH ONCE DAILY 90 tablet 3  . tadalafil (CIALIS) 5 MG tablet Take 5 mg by mouth daily as needed. Erectile dysfunction      . verapamil (CALAN-SR) 240 MG CR tablet Take 1 tablet (240 mg total) by mouth daily. 90 tablet 3  . warfarin (COUMADIN) 5 MG tablet TAKE AS DIRECTED BY  COUMADIN  CLINIC 100 tablet 1   No current facility-administered medications for this visit.    Allergies-reviewed and updated No Known Allergies  Social History   Social History  . Marital Status: Married    Spouse Name: N/A  . Number of Children: 2  . Years of Education: N/A   Occupational History  . chemist     Working part time   Social History Main Topics  . Smoking status: Never Smoker   . Smokeless tobacco: Never Used  . Alcohol Use: No  . Drug Use: No  . Sexual Activity: Not Asked   Other Topics Concern  . None   Social History Narrative   Married. 2 children. 1 grandkid. 1 greatgrandchild.       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, plays bridge on internet, watch reruns, enjoys talking politics    Objective: BP 114/72 mmHg  Pulse 70  Temp(Src) 97.6 F (36.4 C) (Oral)  Ht 5\' 10"  (1.778 m)  Wt 153 lb (69.4 kg)  BMI 21.95 kg/m2  SpO2 98% Gen: NAD, resting comfortably HEENT: Mucous membranes are moist. Oropharynx normal Neck: no thyromegaly CV: RRR no murmurs rubs or gallops Lungs: CTAB no crackles, wheeze, rhonchi Abdomen: soft/nontender/nondistended/normal bowel sounds. No rebound or guarding.  Ext: no edema Skin: warm, dry Neuro: grossly normal, moves all extremities, PERRLA  Assessment/Plan:  AWV completed- discussed recommended screenings anddocumented any personalized health advice and  referrals for preventive counseling. See AVS as well which was given to patient.   Status of chronic or acute concerns   ATRIAL FIBRILLATION S: permanent. Rate controlled on verapamil 240mg  CR, anticoagulation coumadin through cards, considering noac. Also has pacemaker in pace due to prior tachy- brady syndrome A/P: continue current therapy  Essential hypertension S: controlled on lisinopril 10mg , verapamil 240mg  XR BP Readings from Last 3 Encounters:  05/28/16 114/72  01/08/16 100/60  05/30/15 110/72  A/P:Continue current meds:  Continue current medications  Thrombocytopenia (HCC) S: Mild thrombocytopenia stable over 4 years, rest of cell lines stable. Over 89 yeras A/P: will update platelets today  Osteopenia Stop calcium advised given very very mild osteopenia -1.0 left femur  Obstructive sleep apnea CPAP not wearing   Return in about 1 year (around 05/28/2017) for  annual wellness visit.   Orders Placed This Encounter  Procedures  . CBC with Differential/Platelet  . Comprehensive metabolic panel    [place]   Return precautions advised.  Garret Reddish, MD

## 2016-05-28 NOTE — Progress Notes (Signed)
Pre visit review using our clinic review tool, if applicable. No additional management support is needed unless otherwise documented below in the visit note. 

## 2016-05-28 NOTE — Assessment & Plan Note (Signed)
S: controlled on lisinopril 10mg , verapamil 240mg  XR BP Readings from Last 3 Encounters:  05/28/16 114/72  01/08/16 100/60  05/30/15 110/72  A/P:Continue current meds:  Continue current medications

## 2016-05-28 NOTE — Assessment & Plan Note (Signed)
CPAP not wearing

## 2016-05-28 NOTE — Assessment & Plan Note (Signed)
S: permanent. Rate controlled on verapamil 240mg  CR, anticoagulation coumadin through cards, considering noac. Also has pacemaker in pace due to prior tachy- brady syndrome A/P: continue current therapy

## 2016-05-29 DIAGNOSIS — Z0100 Encounter for examination of eyes and vision without abnormal findings: Secondary | ICD-10-CM | POA: Diagnosis not present

## 2016-05-29 DIAGNOSIS — H353132 Nonexudative age-related macular degeneration, bilateral, intermediate dry stage: Secondary | ICD-10-CM | POA: Diagnosis not present

## 2016-06-05 ENCOUNTER — Ambulatory Visit (INDEPENDENT_AMBULATORY_CARE_PROVIDER_SITE_OTHER): Payer: Medicare Other | Admitting: *Deleted

## 2016-06-05 DIAGNOSIS — I4891 Unspecified atrial fibrillation: Secondary | ICD-10-CM | POA: Diagnosis not present

## 2016-06-05 DIAGNOSIS — Z7901 Long term (current) use of anticoagulants: Secondary | ICD-10-CM

## 2016-06-05 LAB — POCT INR: INR: 2.6

## 2016-07-09 ENCOUNTER — Ambulatory Visit (INDEPENDENT_AMBULATORY_CARE_PROVIDER_SITE_OTHER): Payer: Medicare Other | Admitting: *Deleted

## 2016-07-09 ENCOUNTER — Encounter: Payer: Self-pay | Admitting: Internal Medicine

## 2016-07-09 DIAGNOSIS — I482 Chronic atrial fibrillation, unspecified: Secondary | ICD-10-CM

## 2016-07-09 DIAGNOSIS — Z7901 Long term (current) use of anticoagulants: Secondary | ICD-10-CM | POA: Diagnosis not present

## 2016-07-09 DIAGNOSIS — I4891 Unspecified atrial fibrillation: Secondary | ICD-10-CM

## 2016-07-09 DIAGNOSIS — Z950 Presence of cardiac pacemaker: Secondary | ICD-10-CM | POA: Diagnosis not present

## 2016-07-09 LAB — CUP PACEART INCLINIC DEVICE CHECK
Brady Statistic RV Percent Paced: 74 %
Date Time Interrogation Session: 20170816040000
Implantable Lead Implant Date: 19960809
Implantable Lead Location: 753859
Implantable Lead Model: 4285
Implantable Lead Serial Number: 209144
Lead Channel Setting Pacing Pulse Width: 0.4 ms
Lead Channel Setting Sensing Sensitivity: 2.5 mV
MDC IDC LEAD IMPLANT DT: 19960809
MDC IDC LEAD LOCATION: 753860
MDC IDC MSMT LEADCHNL RV IMPEDANCE VALUE: 869 Ohm
MDC IDC MSMT LEADCHNL RV PACING THRESHOLD AMPLITUDE: 1 V
MDC IDC MSMT LEADCHNL RV PACING THRESHOLD PULSEWIDTH: 0.4 ms
MDC IDC MSMT LEADCHNL RV SENSING INTR AMPL: 9.1 mV
MDC IDC SET LEADCHNL RV PACING AMPLITUDE: 2.4 V
Pulse Gen Serial Number: 115653

## 2016-07-09 LAB — POCT INR: INR: 3

## 2016-07-09 NOTE — Patient Instructions (Signed)
Your physician wants you to follow-up in: 6 months with Dr. Klein. You will receive a reminder letter in the mail two months in advance. If you don't receive a letter, please call our office to schedule the follow-up appointment.  

## 2016-07-09 NOTE — Progress Notes (Signed)
Pacemaker check in clinic. Normal device function. Thresholds, sensing, impedances consistent with previous measurements. Device programmed to maximize longevity. 5 NSVT episodes--irregular R-R intervals, +warfarin. Device programmed at appropriate safety margins. Histogram distribution appropriate for patient activity level. Device programmed to optimize intrinsic conduction. Estimated longevity 8 years. Patient education completed. ROV with SK in 6 months.

## 2016-08-13 DIAGNOSIS — R351 Nocturia: Secondary | ICD-10-CM | POA: Diagnosis not present

## 2016-08-13 DIAGNOSIS — N401 Enlarged prostate with lower urinary tract symptoms: Secondary | ICD-10-CM | POA: Diagnosis not present

## 2016-08-13 DIAGNOSIS — N5201 Erectile dysfunction due to arterial insufficiency: Secondary | ICD-10-CM | POA: Diagnosis not present

## 2016-08-20 ENCOUNTER — Ambulatory Visit (INDEPENDENT_AMBULATORY_CARE_PROVIDER_SITE_OTHER): Payer: Medicare Other | Admitting: *Deleted

## 2016-08-20 DIAGNOSIS — I4891 Unspecified atrial fibrillation: Secondary | ICD-10-CM

## 2016-08-20 DIAGNOSIS — Z7901 Long term (current) use of anticoagulants: Secondary | ICD-10-CM | POA: Diagnosis not present

## 2016-08-20 LAB — POCT INR: INR: 2.4

## 2016-09-29 ENCOUNTER — Ambulatory Visit (INDEPENDENT_AMBULATORY_CARE_PROVIDER_SITE_OTHER): Payer: Medicare Other

## 2016-09-29 ENCOUNTER — Telehealth: Payer: Self-pay | Admitting: Internal Medicine

## 2016-09-29 DIAGNOSIS — I4891 Unspecified atrial fibrillation: Secondary | ICD-10-CM | POA: Diagnosis not present

## 2016-09-29 DIAGNOSIS — Z23 Encounter for immunization: Secondary | ICD-10-CM | POA: Diagnosis not present

## 2016-09-29 DIAGNOSIS — Z7901 Long term (current) use of anticoagulants: Secondary | ICD-10-CM

## 2016-09-29 LAB — POCT INR: INR: 2.8

## 2016-09-29 NOTE — Telephone Encounter (Signed)
Left message for patient that I am forwarding message to Dr. Caryl Comes who is out of the office until Wednesday.  I advised that he can call back and ask for me or he can await a call later in the week when Dr. Caryl Comes has had time to review his request.

## 2016-09-29 NOTE — Telephone Encounter (Signed)
New message    1.Patient considering participate  in research study- nicotine patch on memory retention .  Should he be able to tolerated nicotine / wise to participate

## 2016-09-29 NOTE — Telephone Encounter (Signed)
Patient was seen in coumadin clinic today.  He wanted to see if Dr. Caryl Comes was in the office to ask about being able to participate in this study.    The study is wearing nicotine patch to help improve memory  Additional information provided: he thinks the study is 2 years long and wants to know if it may be contraindicated with his pacemaker and coumadin therapy.      He will be away for a couple weeks and it is fine to leave a voice mail on his phone or send a message through his MyChart account.

## 2016-10-01 NOTE — Telephone Encounter (Signed)
No contrainidciataonis of which I am aware

## 2016-10-02 ENCOUNTER — Encounter: Payer: Self-pay | Admitting: Nurse Practitioner

## 2016-10-02 NOTE — Telephone Encounter (Signed)
Dr. Olin Pia response sent through [facility]

## 2016-11-03 DIAGNOSIS — C44519 Basal cell carcinoma of skin of other part of trunk: Secondary | ICD-10-CM | POA: Diagnosis not present

## 2016-11-03 DIAGNOSIS — C44619 Basal cell carcinoma of skin of left upper limb, including shoulder: Secondary | ICD-10-CM | POA: Diagnosis not present

## 2016-11-03 DIAGNOSIS — D485 Neoplasm of uncertain behavior of skin: Secondary | ICD-10-CM | POA: Diagnosis not present

## 2016-11-03 DIAGNOSIS — Z85828 Personal history of other malignant neoplasm of skin: Secondary | ICD-10-CM | POA: Diagnosis not present

## 2016-11-03 DIAGNOSIS — D1801 Hemangioma of skin and subcutaneous tissue: Secondary | ICD-10-CM | POA: Diagnosis not present

## 2016-11-03 DIAGNOSIS — L821 Other seborrheic keratosis: Secondary | ICD-10-CM | POA: Diagnosis not present

## 2016-11-03 DIAGNOSIS — L570 Actinic keratosis: Secondary | ICD-10-CM | POA: Diagnosis not present

## 2016-11-10 ENCOUNTER — Ambulatory Visit (INDEPENDENT_AMBULATORY_CARE_PROVIDER_SITE_OTHER): Payer: Medicare Other | Admitting: Pharmacist

## 2016-11-10 DIAGNOSIS — I4891 Unspecified atrial fibrillation: Secondary | ICD-10-CM | POA: Diagnosis not present

## 2016-11-10 DIAGNOSIS — Z7901 Long term (current) use of anticoagulants: Secondary | ICD-10-CM | POA: Diagnosis not present

## 2016-11-10 LAB — POCT INR: INR: 2.8

## 2016-12-06 ENCOUNTER — Other Ambulatory Visit: Payer: Self-pay | Admitting: Internal Medicine

## 2016-12-22 ENCOUNTER — Ambulatory Visit (INDEPENDENT_AMBULATORY_CARE_PROVIDER_SITE_OTHER): Payer: Medicare Other | Admitting: *Deleted

## 2016-12-22 DIAGNOSIS — Z7901 Long term (current) use of anticoagulants: Secondary | ICD-10-CM

## 2016-12-22 DIAGNOSIS — I4891 Unspecified atrial fibrillation: Secondary | ICD-10-CM

## 2016-12-22 LAB — POCT INR: INR: 2.8

## 2017-01-05 ENCOUNTER — Encounter: Payer: Self-pay | Admitting: Internal Medicine

## 2017-01-09 ENCOUNTER — Encounter: Payer: Medicare Other | Admitting: Internal Medicine

## 2017-01-13 ENCOUNTER — Encounter (INDEPENDENT_AMBULATORY_CARE_PROVIDER_SITE_OTHER): Payer: Self-pay

## 2017-01-13 ENCOUNTER — Encounter: Payer: Self-pay | Admitting: Internal Medicine

## 2017-01-13 ENCOUNTER — Ambulatory Visit (INDEPENDENT_AMBULATORY_CARE_PROVIDER_SITE_OTHER): Payer: Medicare Other | Admitting: Internal Medicine

## 2017-01-13 VITALS — BP 102/48 | HR 63 | Ht 69.0 in | Wt 157.2 lb

## 2017-01-13 DIAGNOSIS — Z950 Presence of cardiac pacemaker: Secondary | ICD-10-CM | POA: Diagnosis not present

## 2017-01-13 DIAGNOSIS — I482 Chronic atrial fibrillation, unspecified: Secondary | ICD-10-CM

## 2017-01-13 DIAGNOSIS — I495 Sick sinus syndrome: Secondary | ICD-10-CM | POA: Diagnosis not present

## 2017-01-13 LAB — CUP PACEART INCLINIC DEVICE CHECK
Battery Remaining Longevity: 89 mo
Brady Statistic RV Percent Paced: 74 %
Date Time Interrogation Session: 20180221134931
Implantable Lead Implant Date: 19960809
Implantable Lead Location: 753859
Implantable Lead Location: 753860
Implantable Lead Serial Number: 209144
Lead Channel Pacing Threshold Pulse Width: 0.4 ms
Lead Channel Setting Pacing Amplitude: 2.4 V
Lead Channel Setting Sensing Sensitivity: 2.5 mV
MDC IDC LEAD IMPLANT DT: 19960809
MDC IDC MSMT LEADCHNL RV IMPEDANCE VALUE: 859 Ohm
MDC IDC MSMT LEADCHNL RV PACING THRESHOLD AMPLITUDE: 1.1 V
MDC IDC MSMT LEADCHNL RV SENSING INTR AMPL: 8.8 mV
MDC IDC PG IMPLANT DT: 20130614
MDC IDC PG SERIAL: 115653
MDC IDC SET LEADCHNL RV PACING PULSEWIDTH: 0.4 ms

## 2017-01-13 NOTE — Patient Instructions (Addendum)
Medication Instructions: - Your physician recommends that you continue on your current medications as directed. Please refer to the Current Medication list given to you today.  Labwork: - Your physician recommends that you have lab work today: bmp/cbc  Procedures/Testing: - none ordered  Follow-Up: - Your physician wants you to follow-up in: 6 months with the [place] year with Dr. Caryl Comes.  You will receive a reminder letter in the mail two months in advance. If you don't receive a letter, please call our office to schedule the follow-up appointment.  Any Additional Special Instructions Will Be Listed Below (If Applicable).     If you need a refill on your cardiac medications before your next appointment, please call your pharmacy.

## 2017-01-13 NOTE — Progress Notes (Signed)
Patient Care Team: Marin Olp, MD as PCP - General (Family Medicine)   HPI  Eric Lambert is a 83 y.o. 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  He is doing quite well. He has no complaints apart from modest fatigue and modest exercise intolerance. He says he cannot talk and walk at the same time. This is been stable over the last 5 years or so.   No bleeding issues   Tolerating meds     Last echo was 2008.   Past Medical History:  Diagnosis Date  . Allergy   . Asthma    childhood  . Atrial fibrillation -permanent   . BPH (benign prostatic hyperplasia)   . CHF (congestive heart failure) ([place])    resolved after pacemaker - tachycardia induced  . Complete heart block ([place])   . ED (erectile dysfunction)   . GERD (gastroesophageal reflux disease)   . GLUCOSE INTOLERANCE 10/22/2007   no recent issues  . OSA (obstructive sleep apnea)   . Pacemaker BSX    dual  . SUBACUTE BACTERIAL ENDOCARDITIS 1970s    Past Surgical History:  Procedure Laterality Date  . INSERT / REPLACE / REMOVE PACEMAKER    . PACEMAKER GENERATOR CHANGE N/A 05/07/2012   Procedure: PACEMAKER GENERATOR CHANGE;  Surgeon: [name], MD;  Location: [facility] CATH LAB;  Service: Cardiovascular;  Laterality: N/A;  . PACEMAKER PLACEMENT    . PARTIAL HIP ARTHROPLASTY     2008    Current Outpatient Prescriptions  Medication Sig Dispense Refill  . cholecalciferol (VITAMIN D) 1000 UNITS tablet Take 1,000 Units by mouth daily.     Marland Kitchen lisinopril (PRINIVIL,ZESTRIL) 10 MG tablet TAKE ONE TABLET BY MOUTH ONCE DAILY 90 tablet 3  . tadalafil (CIALIS) 5 MG tablet Take 5 mg by mouth daily as needed. Erectile dysfunction      . verapamil (CALAN-SR) 240 MG CR tablet Take 1 tablet (240 mg total) by mouth daily. 90 tablet 3  . warfarin (COUMADIN) 5 MG  tablet USE AS DIRECTED BY THE COUMADIN CLINIC 100 tablet 1   No current facility-administered medications for this visit.     No Known Allergies  Review of Systems negative except from HPI and PMH  Physical Exam BP (!) 102/48   Pulse 63   Ht 5\' 9"  (1.753 m)   Wt 157 lb 3.2 oz (71.3 kg)   SpO2 98%   BMI 23.21 kg/m  Well developed and well nourished in no acute distress HENT normal E scleral and icterus cleathis his INR checked here may be taken out over the radial finger checked Neck Supple JVP flat; carotids brisk and full Clear to ausculation Regular rate and rhythm, no murmurs gallops or rub Soft with active bowel sounds No clubbing cyanosis no  Edema Alert and oriented, grossly normal motor and sensory function Skin Warm and Dry  ECG demonstrates Vpacing Intervals-/18/39 Repolarization abnormalities Intermittent ventricular pacing  Assessment and  Plan  Atrial fibrillation  Permanent    Pacemaker-[employer]  The patient's device was interrogated.  The information was reviewed. No changes were made in the programming.     Bradycardia  Hypertension   Atrial fibrillation is permanent. He remains on warfarin. He is disinclined at this point to undertake NOAC  No bleeding issues  Will check Hgb    Check  metabolic profile with ACE;  BP  on the low side may be able to back of on some of meds

## 2017-01-14 LAB — CBC WITH DIFFERENTIAL/PLATELET
BASOS: 0 %
Basophils Absolute: 0 10*3/uL (ref 0.0–0.2)
EOS (ABSOLUTE): 0.1 10*3/uL (ref 0.0–0.4)
EOS: 2 %
HEMATOCRIT: 40.5 % (ref 37.5–51.0)
Hemoglobin: 13.9 g/dL (ref 13.0–17.7)
IMMATURE GRANS (ABS): 0 10*3/uL (ref 0.0–0.1)
IMMATURE GRANULOCYTES: 0 %
Lymphocytes Absolute: 1.6 10*3/uL (ref 0.7–3.1)
Lymphs: 28 %
MCH: 30.8 pg (ref 26.6–33.0)
MCHC: 34.3 g/dL (ref 31.5–35.7)
MCV: 90 fL (ref 79–97)
MONOS ABS: 0.6 10*3/uL (ref 0.1–0.9)
Monocytes: 10 %
NEUTROS ABS: 3.3 10*3/uL (ref 1.4–7.0)
NEUTROS PCT: 60 %
PLATELETS: 139 10*3/uL — AB (ref 150–379)
RBC: 4.51 x10E6/uL (ref 4.14–5.80)
RDW: 13.5 % (ref 12.3–15.4)
WBC: 5.6 10*3/uL (ref 3.4–10.8)

## 2017-01-14 LAB — BASIC METABOLIC PANEL
BUN / CREAT RATIO: 26 — AB (ref 10–24)
BUN: 31 mg/dL — ABNORMAL HIGH (ref 8–27)
CO2: 22 mmol/L (ref 18–29)
CREATININE: 1.2 mg/dL (ref 0.76–1.27)
Calcium: 9.2 mg/dL (ref 8.6–10.2)
Chloride: 102 mmol/L (ref 96–106)
GFR, EST AFRICAN AMERICAN: 63 (ref >59)
GFR, EST NON AFRICAN AMERICAN: 55 — AB (ref >59)
Glucose: 83 mg/dL (ref 65–99)
POTASSIUM: 4.5 mmol/L (ref 3.5–5.2)
SODIUM: 139 mmol/L (ref 134–144)

## 2017-01-16 ENCOUNTER — Other Ambulatory Visit: Payer: Self-pay

## 2017-01-16 MED ORDER — LISINOPRIL 5 MG PO TABS: 5.0000 mg | ORAL_TABLET | Freq: Every day | ORAL | 3 refills | Status: DC

## 2017-01-16 NOTE — Progress Notes (Signed)
Please tell him Dr. Caryl Comes asked me to consider reducing his BP meds

## 2017-01-16 NOTE — Progress Notes (Signed)
Spoke with patient who states he has concerns that Dr. Caryl Comes is not aware of the idea to reduce the dosage of lisinopril. He wants to think about it and he will call back. I did send in the 5mg  dosage to the pharmacy.

## 2017-01-17 ENCOUNTER — Telehealth: Payer: Self-pay | Admitting: Internal Medicine

## 2017-01-19 ENCOUNTER — Telehealth: Payer: Self-pay | Admitting: Family Medicine

## 2017-01-19 NOTE — Telephone Encounter (Signed)
Follow up  Pt voiced returning nurses call to go over medications.

## 2017-01-19 NOTE — Telephone Encounter (Signed)
Eric Lambert pt is very concerned about Dr. Caryl Comes asking Dr. Yong Channel to change his lisinopril w/out checking his Bp several times and would like to have someone to call him to discuss this in depth.

## 2017-01-19 NOTE — Telephone Encounter (Signed)
New message    Pt verbalized that he wants to speak to the rn to go over medications

## 2017-01-19 NOTE — Telephone Encounter (Signed)
I spoke with the patient. He is concerned as he got a call from Dr. Ansel Bong office about reducing his lisinopril to 5 mg daily. In reviewing Dr. Olin Pia most recent office note, he did send a note to Dr. Yong Channel asking his recommendations regarding the patient's BP medications due to his low BP readings.  I advised the patient that Dr. Yong Channel is making the change in his BP medication in response to Dr. Olin Pia recommendation for his input. The patient was concerned as he states our documented BP reading did not match up what is on his paper.  I reported readings that I see in EPIC for up to a year ago. The patient confirms he does not check his BP at home. He states that Dr. Caryl Comes prescribed his lisinopril and he is inclined to do nothing different. I advised him that although the dose is lower on the lisinopril he is still getting benefit from the drug without the risk of dropping his BP too low which could potentially lead to falls. I advised him to please try to obtain a BP cuff and monitor his readings after he stated he felt disinclined to change anything based on one reading.  Before I could finish speaking, the patient cut our conversation short and hung up.  Will forward to Dr. Caryl Comes as an [place] only.

## 2017-01-20 NOTE — Telephone Encounter (Signed)
Call him to help set up appointment so we can check BP and sit down and discuss further

## 2017-01-21 NOTE — Telephone Encounter (Signed)
Pt wants to wait to schedule an appointment as he is checking his blood pressure at home. I did offer to help him schedule an appointment and he declined at this time.

## 2017-01-21 NOTE — Telephone Encounter (Signed)
Thanks for offering appointment- this option remains open to him.

## 2017-01-23 NOTE — Progress Notes (Signed)
When speaking with him I informed him that Dr. Caryl Comes & Dr. Yong Channel had been in communication with each other. I have encouraged him to schedule an appointment to express his concerns and he has declined.

## 2017-02-02 ENCOUNTER — Ambulatory Visit (INDEPENDENT_AMBULATORY_CARE_PROVIDER_SITE_OTHER): Payer: Medicare Other | Admitting: *Deleted

## 2017-02-02 DIAGNOSIS — I4891 Unspecified atrial fibrillation: Secondary | ICD-10-CM | POA: Diagnosis not present

## 2017-02-02 DIAGNOSIS — Z7901 Long term (current) use of anticoagulants: Secondary | ICD-10-CM

## 2017-02-02 LAB — POCT INR: INR: 2.8

## 2017-02-09 ENCOUNTER — Other Ambulatory Visit: Payer: Self-pay | Admitting: Internal Medicine

## 2017-02-09 ENCOUNTER — Telehealth: Payer: Self-pay | Admitting: Family Medicine

## 2017-02-09 NOTE — Telephone Encounter (Signed)
Please call patient back for more information. He had been advised office visit before to evaluate blood pressure as he did not want to reduce meds. Need more info before sent to me

## 2017-02-09 NOTE — Telephone Encounter (Signed)
° ° °  Pt said his blood pressure runs higher at night than it do doing the day. Would like a call back

## 2017-02-10 NOTE — Telephone Encounter (Signed)
Called and spoke with patient who states that he has been checking his blood pressure several times a day. I scheduled him to come in for a follow up visit next week. He mentioned conversations between Dr. Yong Channel and the cardiologist. He will bring BP readings to the visit.

## 2017-02-10 NOTE — Telephone Encounter (Incomplete)
Called and spoke with patient. He states that he had been monitoring his blood pressure at home and

## 2017-02-13 ENCOUNTER — Other Ambulatory Visit: Payer: Self-pay | Admitting: Internal Medicine

## 2017-02-17 NOTE — Telephone Encounter (Signed)
111

## 2017-02-17 NOTE — Telephone Encounter (Signed)
done

## 2017-02-18 ENCOUNTER — Other Ambulatory Visit: Payer: Self-pay | Admitting: Internal Medicine

## 2017-02-18 ENCOUNTER — Ambulatory Visit (INDEPENDENT_AMBULATORY_CARE_PROVIDER_SITE_OTHER): Payer: Medicare Other | Admitting: Family Medicine

## 2017-02-18 ENCOUNTER — Other Ambulatory Visit: Payer: Self-pay

## 2017-02-18 ENCOUNTER — Telehealth: Payer: Self-pay | Admitting: Family Medicine

## 2017-02-18 ENCOUNTER — Encounter: Payer: Self-pay | Admitting: Family Medicine

## 2017-02-18 DIAGNOSIS — I4819 Other persistent atrial fibrillation: Secondary | ICD-10-CM

## 2017-02-18 DIAGNOSIS — D696 Thrombocytopenia, unspecified: Secondary | ICD-10-CM

## 2017-02-18 DIAGNOSIS — I10 Essential (primary) hypertension: Secondary | ICD-10-CM

## 2017-02-18 DIAGNOSIS — I481 Persistent atrial fibrillation: Secondary | ICD-10-CM

## 2017-02-18 MED ORDER — LISINOPRIL 5 MG PO TABS: 5.0000 mg | ORAL_TABLET | Freq: Every day | ORAL | 3 refills | Status: DC

## 2017-02-18 NOTE — Telephone Encounter (Signed)
Pt need new Rx for lisinopril 10 MG   Pharm:  [facility] on [address]  Pt is out of this medication and really need this.

## 2017-02-18 NOTE — Progress Notes (Signed)
Subjective:  Eric Lambert is a 83 y.o. year old very pleasant male patient who presents for/with See problem oriented charting ROS- No chest pain or shortness of breath. No headache or blurry vision. No lightheadedness. Occasionally feels slightly tired when BP on lower end- sitting down for a bit resolves issue- he is not concerned about this pattern   Past Medical History-  Patient Active Problem List   Diagnosis Date Noted  . PPM-[employer] 02/07/2009    Priority: High  . ATRIAL FIBRILLATION 10/21/2007    Priority: High  . Thrombocytopenia ([place]) 08/04/2011    Priority: Medium  . Obstructive sleep apnea 10/21/2007    Priority: Medium  . Essential hypertension 10/21/2007    Priority: Medium  . BPH associated with nocturia 10/21/2007    Priority: Medium  . Erectile dysfunction 05/28/2016    Priority: Low  . Macular degeneration 05/28/2016    Priority: Low  . Right hip pain 08/04/2013    Priority: Low  . 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 Osteopenia 10/22/2007    Priority: Low  . SUBACUTE BACTERIAL ENDOCARDITIS 10/21/2007    Priority: Low  . GERD 10/21/2007    Priority: Low    Medications- reviewed and updated Current Outpatient Prescriptions  Medication Sig Dispense Refill  . cholecalciferol (VITAMIN D) 1000 UNITS tablet Take 1,000 Units by mouth daily.     Marland Kitchen lisinopril (PRINIVIL,ZESTRIL) 5 MG tablet Take 1 tablet (5 mg total) by mouth daily. 90 tablet 3  . tadalafil (CIALIS) 5 MG tablet Take 5 mg by mouth daily as needed. Erectile dysfunction      . verapamil (CALAN-SR) 240 MG CR tablet TAKE ONE TABLET BY MOUTH ONCE DAILY 90 tablet 3  . warfarin (COUMADIN) 5 MG tablet USE AS DIRECTED BY THE COUMADIN CLINIC 100 tablet 1   No current facility-administered medications for this visit.     Objective: BP 128/82 (BP Location: Left Arm, Patient Position: Sitting, Cuff Size: Normal)   Pulse 68   Temp 97.9 F (36.6 C) (Oral)   Ht  5\' 9"  (1.753 m)   Wt 155 lb 12.8 oz (70.7 kg)   SpO2 98%   BMI 23.01 kg/m  Gen: NAD, resting comfortably, able to stand quickly without issues CV: irregularly irregulary Lungs: CTAB  Abdomen: nontender/nondistended. Normal weight Ext: no edema Skin: warm, dry  Assessment/Plan:  Essential hypertension S: controlled on lisinopril and verapamil 240mg  XR. He brings in over 50 readings on graph paper and average about 130/70. Occasionally noted as low as 90/60 usually- often happens around noon. States no lightheadedness with this- sometime thinks he wants to sit down for a bit but then feels fine. Lisinopril is at night at 6 pm and verapamil in AM BP Readings from Last 3 Encounters:  02/18/17 128/82  01/13/17 (!) 102/48  05/28/16 114/72  A/P:Continue current meds:  We discussed reducing lisinopril to 5mg  from 10mg . He wants to be on the more aggressive side for MI /CVA protection so prefers same regimen as states "not unhappy with current life" and occasionally lower end blood pressure. He was encouraged to let us know if his symptoms intensify.   Thrombocytopenia ([place]) S: thrombocytopenia very mild and not worsening Lab Results  Component Value Date   WBC 5.6 01/13/2017   HGB 14.3 05/28/2016   HCT 40.5 01/13/2017   MCV 90 01/13/2017   PLT 139 (L) 01/13/2017  A/P: checked within a month- will continue to monitor. Consider workup if  worsening pattern  ATRIAL FIBRILLATION S: rate controlled on verapamil 240mg  Cr and anticoag[facility]. HR ranges from 60-80 at home.  A/P: continue current medicines doing well  awv planned in July  Return precautions advised.  Garret Reddish, MD

## 2017-02-18 NOTE — Telephone Encounter (Signed)
Prescription sent to pharmacy as requested.

## 2017-02-18 NOTE — Progress Notes (Signed)
Pre visit review using our clinic review tool, if applicable. No additional management support is needed unless otherwise documented below in the visit note. 

## 2017-02-18 NOTE — Telephone Encounter (Signed)
Error

## 2017-02-18 NOTE — Assessment & Plan Note (Signed)
S: controlled on lisinopril and verapamil 240mg  XR. He brings in over 50 readings on graph paper and average about 130/70. Occasionally noted as low as 90/60 usually- often happens around noon. States no lightheadedness with this- sometime thinks he wants to sit down for a bit but then feels fine. Lisinopril is at night at 6 pm and verapamil in AM BP Readings from Last 3 Encounters:  02/18/17 128/82  01/13/17 (!) 102/48  05/28/16 114/72  A/P:Continue current meds:  We discussed reducing lisinopril to 5mg  from 10mg . He wants to be on the more aggressive side for MI /CVA protection so prefers same regimen as states "not unhappy with current life" and occasionally lower end blood pressure. He was encouraged to let us know if his symptoms intensify.

## 2017-02-18 NOTE — Telephone Encounter (Signed)
Close please °

## 2017-02-18 NOTE — Patient Instructions (Signed)
No changes today  Thank you for giving [place] the data to help [place] make an informed decision

## 2017-02-18 NOTE — Assessment & Plan Note (Addendum)
S: rate controlled on verapamil 240mg  Cr and anticoagulated with [facility]farin [facility]. HR ranges from 60-80 at home.  A/P: continue current medicines doing well

## 2017-02-18 NOTE — Assessment & Plan Note (Signed)
S: thrombocytopenia very mild and not worsening Lab Results  Component Value Date   WBC 5.6 01/13/2017   HGB 14.3 05/28/2016   HCT 40.5 01/13/2017   MCV 90 01/13/2017   PLT 139 (L) 01/13/2017  A/P: checked within a month- will continue to monitor. Consider workup if worsening pattern

## 2017-02-19 ENCOUNTER — Other Ambulatory Visit: Payer: Self-pay

## 2017-02-19 MED ORDER — LISINOPRIL 10 MG PO TABS: 10.0000 mg | ORAL_TABLET | Freq: Every day | ORAL | 3 refills | Status: DC

## 2017-02-19 NOTE — Telephone Encounter (Signed)
Called and spoke to the patient who verbalized understanding. I sent the medication to the pharmacy.

## 2017-02-19 NOTE — Telephone Encounter (Signed)
Pt states he is supposed to be on 10 mg.  Pt went over this with Dr Yong Channel at his visit. The lisinopril (PRINIVIL,ZESTRIL) 5 MG tablet was sent in.  Pt states he is almost out and needs the 10 mg .  Did not pick up the 5 mg  [place], [place] .

## 2017-02-19 NOTE — Telephone Encounter (Signed)
Eric Lambert pt called and would like to have a call back today concerning his Rx that was called in on yesterday that was the dosage.

## 2017-02-19 NOTE — Telephone Encounter (Signed)
Please send in the 10mg  dosage- he definitely wanted to be on that dose and I agreed reasonable

## 2017-02-19 NOTE — Telephone Encounter (Signed)
Order Providers   Prescribing Provider Encounter Provider  Marin Olp, MD [place], LPN  Medication Detail    Disp Refills Start End   lisinopril (PRINIVIL,ZESTRIL) 5 MG tablet 90 tablet 3 02/18/2017    Sig - Route: Take 1 tablet (5 mg total) by mouth daily. - Oral   E-Prescribing Status: Receipt confirmed by pharmacy (02/18/2017 2:17 PM EDT)   Pharmacy   [place], [place]

## 2017-02-19 NOTE — Telephone Encounter (Signed)
Per OV note:  We discussed reducing lisinopril to 5mg  from 10mg . He wants to be on the more aggressive side for MI /CVA protection so prefers same regimen as states "not unhappy with current life" and occasionally lower end blood pressure. He was encouraged to let us know if his symptoms intensify.   Ok to increase to 10mg  dosage?

## 2017-03-16 ENCOUNTER — Ambulatory Visit (INDEPENDENT_AMBULATORY_CARE_PROVIDER_SITE_OTHER): Payer: Medicare Other | Admitting: *Deleted

## 2017-03-16 DIAGNOSIS — Z7901 Long term (current) use of anticoagulants: Secondary | ICD-10-CM

## 2017-03-16 DIAGNOSIS — I4891 Unspecified atrial fibrillation: Secondary | ICD-10-CM | POA: Diagnosis not present

## 2017-03-16 LAB — POCT INR: INR: 3.2

## 2017-04-06 DIAGNOSIS — Z961 Presence of intraocular lens: Secondary | ICD-10-CM | POA: Diagnosis not present

## 2017-04-06 DIAGNOSIS — H353132 Nonexudative age-related macular degeneration, bilateral, intermediate dry stage: Secondary | ICD-10-CM | POA: Diagnosis not present

## 2017-04-21 ENCOUNTER — Telehealth: Payer: Self-pay | Admitting: Internal Medicine

## 2017-04-21 NOTE — Telephone Encounter (Signed)
Patient calling states that he has come concerns about his BP medication. Thanks.  Pt c/o BP issue: STAT if pt c/o blurred vision, one-sided weakness or slurred speech  1. What are your last 5 BP readings? 100/65  2. Are you having any other symptoms (ex. Dizziness, headache, blurred vision, passed out)? "gets tired easily, not a lot of energy"  3. What is your BP issue? Low

## 2017-04-21 NOTE — Telephone Encounter (Signed)
Patient called today with a BP update. He states that his BP typically runs 100-110/60-69. However, he did note SBP readings of 110-130's.  He states he is taking lisinpril 10 mg 1/2 tablet every evening and verapamil 240 mg every morning.  He denies dizziness/ lightheaded/ syncope/ pre-syncope. He does have some fatigue, but is still able to push mow his yard. After discussing his blood pressure at length with him, I advised him that if he is concerned about the readings, he should touch base with Dr. Yong Channel as Dr. Caryl Comes is just managing his electrical issues with his heart.  He inquired if he should have a general cardiologist.  I advised him there is no indication for him to see general cardiology at this time as his PCP can manage his BP.  However, I have also advised him if he is without symptoms at this time, there is no real reason to change his medications, but again, if he has concerns he can touch base with Dr. Ansel Bong office.  He voices understanding.

## 2017-04-21 NOTE — Telephone Encounter (Signed)
I left a message for the patient to call. 

## 2017-04-22 ENCOUNTER — Encounter (INDEPENDENT_AMBULATORY_CARE_PROVIDER_SITE_OTHER): Payer: Self-pay

## 2017-04-22 ENCOUNTER — Ambulatory Visit (INDEPENDENT_AMBULATORY_CARE_PROVIDER_SITE_OTHER): Payer: Medicare Other | Admitting: Pharmacist

## 2017-04-22 DIAGNOSIS — Z7901 Long term (current) use of anticoagulants: Secondary | ICD-10-CM

## 2017-04-22 DIAGNOSIS — I4891 Unspecified atrial fibrillation: Secondary | ICD-10-CM | POA: Diagnosis not present

## 2017-04-22 LAB — POCT INR: INR: 2.5

## 2017-04-24 DIAGNOSIS — H26493 Other secondary cataract, bilateral: Secondary | ICD-10-CM | POA: Diagnosis not present

## 2017-04-24 DIAGNOSIS — H524 Presbyopia: Secondary | ICD-10-CM | POA: Diagnosis not present

## 2017-04-24 DIAGNOSIS — H353132 Nonexudative age-related macular degeneration, bilateral, intermediate dry stage: Secondary | ICD-10-CM | POA: Diagnosis not present

## 2017-05-15 DIAGNOSIS — L578 Other skin changes due to chronic exposure to nonionizing radiation: Secondary | ICD-10-CM | POA: Diagnosis not present

## 2017-05-15 DIAGNOSIS — L821 Other seborrheic keratosis: Secondary | ICD-10-CM | POA: Diagnosis not present

## 2017-05-15 DIAGNOSIS — D1801 Hemangioma of skin and subcutaneous tissue: Secondary | ICD-10-CM | POA: Diagnosis not present

## 2017-05-15 DIAGNOSIS — Z85828 Personal history of other malignant neoplasm of skin: Secondary | ICD-10-CM | POA: Diagnosis not present

## 2017-05-15 DIAGNOSIS — D224 Melanocytic nevi of scalp and neck: Secondary | ICD-10-CM | POA: Diagnosis not present

## 2017-05-15 DIAGNOSIS — D225 Melanocytic nevi of trunk: Secondary | ICD-10-CM | POA: Diagnosis not present

## 2017-05-28 DIAGNOSIS — H26491 Other secondary cataract, right eye: Secondary | ICD-10-CM | POA: Diagnosis not present

## 2017-05-29 ENCOUNTER — Telehealth: Payer: Self-pay | Admitting: Family Medicine

## 2017-05-29 NOTE — Telephone Encounter (Signed)
Pt would like a call back on Monday (aware Dr Yong Channel out until then) to discuss getting some information from his cardiologist in med management.  Pt would like to discuss something prior to his appt for CPE on Tuesday.

## 2017-06-02 ENCOUNTER — Ambulatory Visit (INDEPENDENT_AMBULATORY_CARE_PROVIDER_SITE_OTHER): Payer: Medicare Other | Admitting: Family Medicine

## 2017-06-02 ENCOUNTER — Encounter: Payer: Self-pay | Admitting: Family Medicine

## 2017-06-02 VITALS — BP 120/64 | HR 60 | Temp 97.8°F | Ht 69.0 in | Wt 154.2 lb

## 2017-06-02 DIAGNOSIS — M25561 Pain in right knee: Secondary | ICD-10-CM

## 2017-06-02 DIAGNOSIS — I10 Essential (primary) hypertension: Secondary | ICD-10-CM

## 2017-06-02 NOTE — Progress Notes (Signed)
Subjective:  Eric Lambert is a 83 y.o. year old very pleasant male patient who presents for/with See problem oriented charting ROS- some right knee pain. No chest pain or shortness of breath. No edema.    Past Medical History-  Patient Active Problem List   Diagnosis Date Noted  . PPM-[employer] 02/07/2009    Priority: High  . ATRIAL FIBRILLATION 10/21/2007    Priority: High  . Thrombocytopenia ([place]) 08/04/2011    Priority: Medium  . Obstructive sleep apnea 10/21/2007    Priority: Medium  . Essential hypertension 10/21/2007    Priority: Medium  . BPH associated with nocturia 10/21/2007    Priority: Medium  . Erectile dysfunction 05/28/2016    Priority: Low  . Macular degeneration 05/28/2016    Priority: Low  . Right hip pain 08/04/2013    Priority: Low  . 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 Osteopenia 10/22/2007    Priority: Low  . SUBACUTE BACTERIAL ENDOCARDITIS 10/21/2007    Priority: Low  . GERD 10/21/2007    Priority: Low  . Right knee pain 06/02/2017    Medications- reviewed and updated Current Outpatient Prescriptions  Medication Sig Dispense Refill  . cholecalciferol (VITAMIN D) 1000 UNITS tablet Take 1,000 Units by mouth daily.     Marland Kitchen lisinopril (PRINIVIL,ZESTRIL) 10 MG tablet Take 10 mg by mouth daily. Take 1/2 tablet (5 mg) by mouth once daily     . tadalafil (CIALIS) 5 MG tablet Take 5 mg by mouth daily as needed. Erectile dysfunction      . verapamil (CALAN-SR) 240 MG CR tablet TAKE ONE TABLET BY MOUTH ONCE DAILY 90 tablet 3  . warfarin (COUMADIN) 5 MG tablet USE AS DIRECTED BY THE COUMADIN CLINIC 100 tablet 1   No current facility-administered medications for this visit.     Objective: BP 120/64 (BP Location: Left Arm, Patient Position: Sitting, Cuff Size: Large)   Pulse 60   Temp 97.8 F (36.6 C) (Oral)   Ht 5\' 9"  (1.753 m)   Wt 154 lb 3.2 oz (69.9 kg)   SpO2 95%   BMI 22.77 kg/m  Gen: NAD, resting  comfortably CV: RRR no murmurs rubs or gallops Lungs: CTAB no crackles, wheeze, rhonchi Abdomen: soft/nontender/nondistended/normal bowel sounds. No rebound or guarding.  Ext: no edema Skin: warm, dry Neuro: normal gait Knee: Normal to inspection with no erythema or effusion or obvious bony abnormalities. Palpation normal with no warmth or joint line tenderness or patellar tenderness or condyle tenderness. ROM normal in flexion and extension and lower leg rotation. Ligaments with solid consistent endpoints including ACL, PCL, LCL, MCL. Negative Mcmurray's    Assessment/Plan:  Right knee pain S: intermittent right knee pain on lateral side for at least 3 months. Sometimes in the morning seems to be the worst- seems to get better with getting up and moving.  A/P: reassuring exam. Suspected OA. Possible degenerative meniscal tear/flap. Since it is only a mild nuisance discussed icing as it occurs  Essential hypertension S: controlled on Lisinopril 10mg , verapamil 240mg  XR- now taking both in the evening. Home #s as low as 90/60s but asymptomatic with highs noted max 144.  ASCVD 10 year risk calculation if age 60-79: aged out BP Readings from Last 3 Encounters:  06/02/17 120/64  02/18/17 128/82  01/13/17 (!) 102/48  A/P: We discussed blood pressure goal of <140/90 ideally. Continue current meds. Home monitoring with some variation but generally <130/80 and no symptoms if on low  end.    Return in about 6 months (around 12/03/2017) for come fasting .  Return precautions advised.  Garret Reddish, MD

## 2017-06-02 NOTE — Assessment & Plan Note (Addendum)
S: intermittent right knee pain on lateral side for at least 3 months. Sometimes in the morning seems to be the worst- seems to get better with getting up and moving.  A/P: reassuring exam. Suspected OA. Possible degenerative meniscal tear/flap. Since it is only a mild nuisance discussed icing as it occurs

## 2017-06-02 NOTE — Patient Instructions (Signed)
No changes today  Would ice the knee when it bothers you.

## 2017-06-02 NOTE — Assessment & Plan Note (Signed)
S: controlled on Lisinopril 10mg , verapamil 240mg  XR- now taking both in the evening. Home #s as low as 90/60s but asymptomatic with highs noted max 144.  ASCVD 10 year risk calculation if age 81-79: aged out BP Readings from Last 3 Encounters:  06/02/17 120/64  02/18/17 128/82  01/13/17 (!) 102/48  A/P: We discussed blood pressure goal of <140/90 ideally. Continue current meds. Home monitoring with some variation but generally <130/80 and no symptoms if on low end.

## 2017-06-03 ENCOUNTER — Ambulatory Visit (INDEPENDENT_AMBULATORY_CARE_PROVIDER_SITE_OTHER): Payer: Medicare Other | Admitting: Pharmacist

## 2017-06-03 DIAGNOSIS — I4891 Unspecified atrial fibrillation: Secondary | ICD-10-CM

## 2017-06-03 DIAGNOSIS — Z7901 Long term (current) use of anticoagulants: Secondary | ICD-10-CM | POA: Diagnosis not present

## 2017-06-03 LAB — POCT INR: INR: 2.4

## 2017-06-11 DIAGNOSIS — H26492 Other secondary cataract, left eye: Secondary | ICD-10-CM | POA: Diagnosis not present

## 2017-06-25 DIAGNOSIS — M25551 Pain in right hip: Secondary | ICD-10-CM | POA: Diagnosis not present

## 2017-06-25 DIAGNOSIS — Z96651 Presence of right artificial knee joint: Secondary | ICD-10-CM | POA: Diagnosis not present

## 2017-06-26 ENCOUNTER — Telehealth: Payer: Self-pay | Admitting: Family Medicine

## 2017-06-26 NOTE — Telephone Encounter (Signed)
Yes thanks may use both- the topical voltaren/diclofenac does not have much systemic absorption so should be fine to use

## 2017-06-26 NOTE — Telephone Encounter (Signed)
Patient Name: Eric Lambert DOB: 08/11/1929 Initial Comment Caller needs clarification on a rx with combination with another medication if its safe to use. Topical gel Diclofenac Gel wants to know if its safe to have with warfrin. Nurse Assessment Nurse: Kathi Ludwig, RN, Leana Roe Date/Time [name] Time): 06/26/2017 12:20:57 PM Confirm and document reason for call. If symptomatic, describe symptoms. ---Caller states he needs clarification on a prescription medication. Topical gel 1 percent Diclofenac Gel wants to know if its safe to have with warfarin. Does the patient have any new or worsening symptoms? ---No Please document clinical information provided and list any resource used. ---https://www.drugs.com/interactions-check.php? drug_list=2311-0,869-0 Bleeding risk. Diclofenac prescribed by Orthopedic Guidelines Guideline Title Affirmed Question Affirmed Notes Final Disposition User Comments Please send note to [facility] if any further instructions needed. Instructed patient that there is an increased risk for bleeding using a NSAID and Warfarin. Patient states he understands, but comments that it is not the same as aspirin. Re instructed that NSAIDs also increase bleeding risk and the gel is absorbed through the skin. Call placed to office, but Dr. Yong Channel not available today. Caller also states he may check with [facility].

## 2017-06-26 NOTE — Telephone Encounter (Signed)
FYI

## 2017-06-30 NOTE — Telephone Encounter (Signed)
Spoke with patient who verbalized understanding.

## 2017-07-10 ENCOUNTER — Other Ambulatory Visit: Payer: Self-pay | Admitting: Internal Medicine

## 2017-07-13 ENCOUNTER — Ambulatory Visit (INDEPENDENT_AMBULATORY_CARE_PROVIDER_SITE_OTHER): Payer: Medicare Other | Admitting: *Deleted

## 2017-07-13 DIAGNOSIS — Z7901 Long term (current) use of anticoagulants: Secondary | ICD-10-CM

## 2017-07-13 DIAGNOSIS — I4891 Unspecified atrial fibrillation: Secondary | ICD-10-CM

## 2017-07-13 DIAGNOSIS — I429 Cardiomyopathy, unspecified: Secondary | ICD-10-CM

## 2017-07-13 LAB — POCT INR: INR: 3

## 2017-07-14 LAB — CUP PACEART INCLINIC DEVICE CHECK
Date Time Interrogation Session: 20180820040000
Implantable Lead Implant Date: 19960809
Implantable Lead Location: 753859
Implantable Lead Model: 4285
Implantable Pulse Generator Implant Date: 20130614
Lead Channel Pacing Threshold Amplitude: 1 V
Lead Channel Pacing Threshold Pulse Width: 0.4 ms
Lead Channel Setting Pacing Pulse Width: 0.4 ms
MDC IDC LEAD IMPLANT DT: 19960809
MDC IDC LEAD LOCATION: 753860
MDC IDC LEAD SERIAL: 209144
MDC IDC MSMT LEADCHNL RV IMPEDANCE VALUE: 861 Ohm
MDC IDC MSMT LEADCHNL RV SENSING INTR AMPL: 9 mV
MDC IDC PG SERIAL: 115653
MDC IDC SET LEADCHNL RV PACING AMPLITUDE: 2.4 V
MDC IDC SET LEADCHNL RV SENSING SENSITIVITY: 2.5 mV

## 2017-07-14 NOTE — Progress Notes (Signed)
Pacemaker check in clinic. Normal device function. Threshold, sensing, impedance consistent with previous measurements. Device programmed to maximize longevity. 11 high ventricular rates noted-- EGMs appear irregular R-R intervals, Peak V 169 bpm. Device programmed at appropriate safety margins. Histogram distribution appropriate for patient activity level. Device programmed to optimize intrinsic conduction. Estimated longevity 7 years. Patient education completed. ROV with SK 12/2017.

## 2017-08-24 ENCOUNTER — Ambulatory Visit (INDEPENDENT_AMBULATORY_CARE_PROVIDER_SITE_OTHER): Payer: Medicare Other | Admitting: *Deleted

## 2017-08-24 DIAGNOSIS — R351 Nocturia: Secondary | ICD-10-CM | POA: Diagnosis not present

## 2017-08-24 DIAGNOSIS — I4891 Unspecified atrial fibrillation: Secondary | ICD-10-CM

## 2017-08-24 DIAGNOSIS — N401 Enlarged prostate with lower urinary tract symptoms: Secondary | ICD-10-CM | POA: Diagnosis not present

## 2017-08-24 DIAGNOSIS — Z5181 Encounter for therapeutic drug level monitoring: Secondary | ICD-10-CM

## 2017-08-24 DIAGNOSIS — N5201 Erectile dysfunction due to arterial insufficiency: Secondary | ICD-10-CM | POA: Diagnosis not present

## 2017-08-24 DIAGNOSIS — Z7901 Long term (current) use of anticoagulants: Secondary | ICD-10-CM

## 2017-08-24 LAB — POCT INR: INR: 2.9

## 2017-10-05 ENCOUNTER — Ambulatory Visit (INDEPENDENT_AMBULATORY_CARE_PROVIDER_SITE_OTHER): Payer: Medicare Other | Admitting: *Deleted

## 2017-10-05 DIAGNOSIS — Z23 Encounter for immunization: Secondary | ICD-10-CM | POA: Diagnosis not present

## 2017-10-05 DIAGNOSIS — I481 Persistent atrial fibrillation: Secondary | ICD-10-CM | POA: Diagnosis not present

## 2017-10-05 DIAGNOSIS — Z7901 Long term (current) use of anticoagulants: Secondary | ICD-10-CM | POA: Diagnosis not present

## 2017-10-05 DIAGNOSIS — I4819 Other persistent atrial fibrillation: Secondary | ICD-10-CM

## 2017-10-05 DIAGNOSIS — I4891 Unspecified atrial fibrillation: Secondary | ICD-10-CM

## 2017-10-05 LAB — POCT INR: INR: 2.5

## 2017-10-05 NOTE — Progress Notes (Signed)
10/05/17-INR-2.5 Instructions: Continue on same dosage 1 tablet every day except 1/2 tablet on Mondays. Recheck in 6 weeks.

## 2017-10-26 ENCOUNTER — Other Ambulatory Visit: Payer: Self-pay | Admitting: Internal Medicine

## 2017-11-13 ENCOUNTER — Ambulatory Visit (INDEPENDENT_AMBULATORY_CARE_PROVIDER_SITE_OTHER): Payer: Medicare Other | Admitting: *Deleted

## 2017-11-13 DIAGNOSIS — Z7901 Long term (current) use of anticoagulants: Secondary | ICD-10-CM

## 2017-11-13 DIAGNOSIS — I4891 Unspecified atrial fibrillation: Secondary | ICD-10-CM | POA: Diagnosis not present

## 2017-11-13 DIAGNOSIS — I4819 Other persistent atrial fibrillation: Secondary | ICD-10-CM

## 2017-11-13 DIAGNOSIS — I481 Persistent atrial fibrillation: Secondary | ICD-10-CM | POA: Diagnosis not present

## 2017-11-13 LAB — POCT INR: INR: 2.7

## 2017-11-13 NOTE — Patient Instructions (Signed)
Description   Continue on same dosage 1 tablet every day except 1/2 tablet on Mondays. Recheck in 6 weeks. [facility]in [facility]# [phone]

## 2017-11-23 ENCOUNTER — Ambulatory Visit: Payer: Medicare Other | Admitting: Family Medicine

## 2017-11-23 ENCOUNTER — Ambulatory Visit (INDEPENDENT_AMBULATORY_CARE_PROVIDER_SITE_OTHER): Payer: Medicare Other | Admitting: Family Medicine

## 2017-11-23 ENCOUNTER — Telehealth: Payer: Self-pay | Admitting: *Deleted

## 2017-11-23 ENCOUNTER — Encounter: Payer: Self-pay | Admitting: Family Medicine

## 2017-11-23 VITALS — BP 128/66 | HR 64 | Temp 97.7°F | Resp 16 | Wt 160.0 lb

## 2017-11-23 DIAGNOSIS — H8110 Benign paroxysmal vertigo, unspecified ear: Secondary | ICD-10-CM | POA: Diagnosis not present

## 2017-11-23 NOTE — Progress Notes (Signed)
OFFICE VISIT  11/24/2017   CC:  Chief Complaint  Eric Lambert presents with  . Dizziness   HPI:    Eric Lambert is a 83 y.o.  male who presents for "episodes of vertigo". Occurring over the last 1 mo, sporadic, seem to becoming more severe. Yesterday had 2 episodes: once when he leaned over to get a leaf off the floor, led to a fall forward and he hit his fireplace/hearth.  No injury. Consistent trigger is rolling over side to side in bed, esp if too rapid. Describes swirling motion and/or feeling of falling when the episodes occur, +nausea but no vomiting.   The episodes last less than 5 min.  Being very still helps them resolve.  No ringing in ears and no hearing loss associated. Some mild headaches lately.  No meds tried.  He does not walk in a very staggered manner. Between episodes he feels no dizziness/lightheadedness.  Says balance has been generally mildly impaired the last couple years--no acute change.  No palpitations or heart racing has been noted.   No prior hx of recurrent vertigo spells.    Pt PMH signif for perm a-fib on chronic coumadin therapy and using verapamil for rate control. Also has complete heart block s/p pacemaker.  ROS: no focal weakness, no dysarthria, no dysphagia, no tremor, no double vision.  No recent URI sx's, fever, cough, SOB, or CP.  Past Medical History:  Diagnosis Date  . Allergy   . Asthma    childhood  . Atrial fibrillation -permanent   . BPH (benign prostatic hyperplasia)   . CHF (congestive heart failure) ([place])    resolved after pacemaker - tachycardia induced  . Complete heart block ([place])   . ED (erectile dysfunction)   . GERD (gastroesophageal reflux disease)   . GLUCOSE INTOLERANCE 10/22/2007   no recent issues  . OSA (obstructive sleep apnea)   . Pacemaker BSX    dual  . SUBACUTE BACTERIAL ENDOCARDITIS 1970s    Past Surgical History:  Procedure Laterality Date  . INSERT / REPLACE / REMOVE PACEMAKER    . PACEMAKER GENERATOR CHANGE  N/A 05/07/2012   Procedure: PACEMAKER GENERATOR CHANGE;  Surgeon: [name], MD;  Location: [facility] CATH LAB;  Service: Cardiovascular;  Laterality: N/A;  . PACEMAKER PLACEMENT    . PARTIAL HIP ARTHROPLASTY     2008    Outpatient Medications Prior to Visit  Medication Sig Dispense Refill  . cholecalciferol (VITAMIN D) 1000 UNITS tablet Take 1,000 Units by mouth daily.     Marland Kitchen lisinopril (PRINIVIL,ZESTRIL) 10 MG tablet Take 10 mg by mouth daily. Take 1/2 tablet (5 mg) by mouth once daily     . tadalafil (CIALIS) 5 MG tablet Take 5 mg by mouth daily as needed. Erectile dysfunction      . verapamil (CALAN-SR) 240 MG CR tablet TAKE ONE TABLET BY MOUTH ONCE DAILY 90 tablet 3  . warfarin (COUMADIN) 5 MG tablet USE AS [facility]ED [facility] 100 tablet 0   No facility-administered medications prior to visit.     No Known Allergies  ROS As per HPI  PE: Blood pressure 128/66, pulse 64, temperature 97.7 F (36.5 C), resp. rate 16, weight 160 lb (72.6 kg), SpO2 98 %. Gen: Alert, well appearing.  Eric Lambert is oriented to person, place, time, and situation. AFFECT: pleasant, lucid thought and speech. CNO:BSJG: no injection, icteris, swelling, or exudate.  EOMI, PERRLA.  No nystagmus. Mouth: lips without lesion/swelling.  Oral mucosa pink and moist. Oropharynx  without erythema, exudate, or swelling.  CV: RRR.  .   LUNGS: CTA bilat, nonlabored resps, good aeration in all lung fields. ABD: soft, NT/ND. EXT: no significant LL edema, no clubbing or cyanosis. Dix-Halpike maneuver in office today did not elicit any vertigo, nystagmus, or nausea when done on either side. Neuro: CN 2-12 intact bilaterally, strength 5/5 in proximal and distal upper extremities and lower extremities bilaterally.  No tremor.  FNF normal.  No ataxia.   No pronator drift.   LABS:    Chemistry      Component Value Date/Time   NA 139 01/13/2017 1533   K 4.5 01/13/2017 1533   CL 102 01/13/2017 1533   CO2 22  01/13/2017 1533   BUN 31 (H) 01/13/2017 1533   CREATININE 1.20 01/13/2017 1533      Component Value Date/Time   CALCIUM 9.2 01/13/2017 1533   ALKPHOS 53 05/28/2016 0858   AST 17 05/28/2016 0858   ALT 14 05/28/2016 0858   BILITOT 0.9 05/28/2016 0858     Lab Results  Component Value Date   WBC 5.6 01/13/2017   HGB 13.9 01/13/2017   HCT 40.5 01/13/2017   MCV 90 01/13/2017   PLT 139 (L) 01/13/2017    IMPRESSION AND PLAN:  BPPV, unspecified laterality. Discussed specifics of this diagnosis today, typical course, lack of "cure", but home epley's maneuvers discussed/handout given to help with hopefully extinguishing the current episodes lately. Pt's questions answered to the best of my ability today. Signs/symptoms to call or return for were reviewed and pt expressed understanding.  Spent 25 min with pt today, with >50% of this time spent in counseling and care coordination regarding the above problems.  An After Visit Summary was printed and given to the Eric Lambert.  FOLLOW UP: Return if symptoms worsen or fail to improve.  Signed:  Crissie Sickles, MD           11/24/2017

## 2017-11-23 NOTE — Telephone Encounter (Signed)
noted thanks  

## 2017-11-23 NOTE — Telephone Encounter (Signed)
Copied from [place] [phone]. Topic: Appointment Scheduling - Same Day Appointment >> Nov 23, 2017  9:17 AM Robina Ade, Helene Kelp D wrote: Patient called to schedule an appointment for TODAY with [facility]. Schedule with Dr. Anitra Lauth for acute visit.

## 2017-12-02 ENCOUNTER — Ambulatory Visit: Payer: Medicare Other | Admitting: Family Medicine

## 2017-12-10 ENCOUNTER — Ambulatory Visit (INDEPENDENT_AMBULATORY_CARE_PROVIDER_SITE_OTHER): Payer: Medicare Other | Admitting: Family Medicine

## 2017-12-10 ENCOUNTER — Encounter: Payer: Self-pay | Admitting: Family Medicine

## 2017-12-10 VITALS — BP 110/68 | HR 68 | Temp 97.4°F | Ht 69.0 in | Wt 158.4 lb

## 2017-12-10 DIAGNOSIS — M25561 Pain in right knee: Secondary | ICD-10-CM | POA: Diagnosis not present

## 2017-12-10 DIAGNOSIS — I481 Persistent atrial fibrillation: Secondary | ICD-10-CM | POA: Diagnosis not present

## 2017-12-10 DIAGNOSIS — I4819 Other persistent atrial fibrillation: Secondary | ICD-10-CM

## 2017-12-10 DIAGNOSIS — I10 Essential (primary) hypertension: Secondary | ICD-10-CM

## 2017-12-10 DIAGNOSIS — D696 Thrombocytopenia, unspecified: Secondary | ICD-10-CM | POA: Diagnosis not present

## 2017-12-10 DIAGNOSIS — H8110 Benign paroxysmal vertigo, unspecified ear: Secondary | ICD-10-CM | POA: Insufficient documentation

## 2017-12-10 MED ORDER — LISINOPRIL 10 MG PO TABS: 10.0000 mg | ORAL_TABLET | Freq: Every day | ORAL | 3 refills | Status: DC

## 2017-12-10 NOTE — Assessment & Plan Note (Signed)
S: suspect OA right knee based on prior exam and history. He states this has not bothered him much A/P: continue to monitor only

## 2017-12-10 NOTE — Addendum Note (Signed)
Addended by: Mariam Dollar, Roselyn Reef M on: 12/10/2017 10:06 AM   Modules accepted: Orders

## 2017-12-10 NOTE — Assessment & Plan Note (Signed)
S: controlled on lisinopril 10mg , verapamil 240mg  XR- both in PM.  BP Readings from Last 3 Encounters:  12/10/17 110/68  11/23/17 128/66  06/02/17 120/64  A/P: We discussed blood pressure goal of <140/90. Continue current meds

## 2017-12-10 NOTE — Assessment & Plan Note (Signed)
S: very mild symptoms from BPV- often at night with rolling over. Sparingly does home exercises. No hearing loss or ringing in the ears.  A/P: discussed vestibular rehab- he declines for now

## 2017-12-10 NOTE — Patient Instructions (Signed)
Schedule a lab visit at the check out desk for tomorrow early morning. Return for future fasting labs meaning nothing but water after midnight please. Ok to take your medications with water.   Please let me know if you change your mind and want to try the vestibular rehab for your vertigo with physical therapy  No other changes today- blood pressure looks great

## 2017-12-10 NOTE — Progress Notes (Signed)
Subjective:  Eric Lambert is a 84 y.o. year old very pleasant male patient who presents for/with See problem oriented charting ROS- No chest pain or shortness of breath. No headache or blurry vision. Has had some vertigo particularly with turning over in bed   Past Medical History-  Patient Active Problem List   Diagnosis Date Noted  . PPM-[employer] 02/07/2009    Priority: High  . ATRIAL FIBRILLATION 10/21/2007    Priority: High  . BPPV (benign paroxysmal positional vertigo) 12/10/2017    Priority: Medium  . Thrombocytopenia ([place]) 08/04/2011    Priority: Medium  . Obstructive sleep apnea 10/21/2007    Priority: Medium  . Essential hypertension 10/21/2007    Priority: Medium  . BPH associated with nocturia 10/21/2007    Priority: Medium  . Erectile dysfunction 05/28/2016    Priority: Low  . Macular degeneration 05/28/2016    Priority: Low  . Right hip pain 08/04/2013    Priority: Low  . Secondary cardiomyopathy ([place]) 02/07/2009    Priority: Low  . Osteopenia 10/22/2007    Priority: Low  . SUBACUTE BACTERIAL ENDOCARDITIS 10/21/2007    Priority: Low  . GERD 10/21/2007    Priority: Low  . Right knee pain 06/02/2017    Medications- reviewed and updated Current Outpatient Medications  Medication Sig Dispense Refill  . cholecalciferol (VITAMIN D) 1000 UNITS tablet Take 1,000 Units by mouth daily.     Marland Kitchen lisinopril (PRINIVIL,ZESTRIL) 10 MG tablet Take 10 mg by mouth daily. Take 1/2 tablet (5 mg) by mouth once daily     . verapamil (CALAN-SR) 240 MG CR tablet TAKE ONE TABLET BY MOUTH ONCE DAILY 90 tablet 3  . warfarin (COUMADIN) 5 MG tablet USE AS DIRECTED [facility] 100 tablet 0   No current facility-administered medications for this visit.     Objective: BP 110/68 (BP Location: Left Arm, Patient Position: Sitting, Cuff Size: Large)   Pulse 68   Temp (!) 97.4 F (36.3 C) (Oral)   Ht 5\' 9"  (1.753 m)   Wt 158 lb 6.4 oz (71.8 kg)   SpO2 98%    BMI 23.39 kg/m  Gen: NAD, resting comfortably CV: RRR no murmurs rubs or gallops Lungs: CTAB no crackles, wheeze, rhonchi Abdomen: soft/nontender/nondistended/normal bowel sounds. thin  Ext: no edema Skin: warm, dry  Assessment/Plan:  Other notes 1. Saw Dr. Gaynelle Arabian for BPh with nocturia- last visit Dr. Pilar Jarvis told him only prn follow up 2. Has OSA but doesn't use his cpap 3. Thrombocytopenia- will need to update CBC but has been stable in 100-150 range and other cell lines normal  Essential hypertension S: controlled on lisinopril 10mg , verapamil 240mg  XR- both in PM.  BP Readings from Last 3 Encounters:  12/10/17 110/68  11/23/17 128/66  06/02/17 120/64  A/P: We discussed blood pressure goal of <140/90. Continue current meds  ATRIAL FIBRILLATION S: Patient remains on verapamil for rate control. He is on coumadin through cardiology. 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 Saw cardiology 12/2016 with plans to see them within a month or two A/P: continue current medications- permanent.   BPPV (benign paroxysmal positional vertigo) S: very mild symptoms from BPV- often at night with rolling over. Sparingly does home exercises. No hearing loss or ringing in the ears.  A/P: discussed vestibular rehab- he declines for now  Right knee pain S: suspect OA right knee based on prior exam and history. He states this has not bothered him much A/P: continue to monitor  only  Future Appointments  Date Time Provider [place]  12/25/2017  9:30 AM [facility]  01/20/2018 10:15 AM [name], MD [facility]   6 months  Lab/Order associations: Essential hypertension - Plan: CBC, Comprehensive metabolic panel, Lipid panel  Persistent atrial fibrillation ([place]) - Plan: CBC, Comprehensive metabolic panel  Return precautions advised.  Garret Reddish, MD

## 2017-12-10 NOTE — Assessment & Plan Note (Signed)
S: Patient remains on verapamil for rate control. He is on coumadin through cardiology. 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 Saw cardiology 12/2016 with plans to see them within a month or two A/P: continue current medications- permanent.

## 2017-12-11 ENCOUNTER — Other Ambulatory Visit (INDEPENDENT_AMBULATORY_CARE_PROVIDER_SITE_OTHER): Payer: Medicare Other

## 2017-12-11 DIAGNOSIS — I4819 Other persistent atrial fibrillation: Secondary | ICD-10-CM

## 2017-12-11 DIAGNOSIS — I481 Persistent atrial fibrillation: Secondary | ICD-10-CM

## 2017-12-11 DIAGNOSIS — I10 Essential (primary) hypertension: Secondary | ICD-10-CM

## 2017-12-11 LAB — LIPID PANEL
Cholesterol: 155 mg/dL (ref 0–200)
HDL: 78.1 mg/dL (ref >39.00)
LDL Cholesterol: 69 mg/dL (ref 0–99)
NONHDL: 76.99
Total CHOL/HDL Ratio: 2
Triglycerides: 42 mg/dL (ref 0.0–149.0)
VLDL: 8.4 mg/dL (ref 0.0–40.0)

## 2017-12-11 LAB — COMPREHENSIVE METABOLIC PANEL
ALBUMIN: 3.8 g/dL (ref 3.5–5.2)
ALK PHOS: 56 U/L (ref 39–117)
ALT: 11 U/L (ref 0–53)
AST: 14 U/L (ref 0–37)
BILIRUBIN TOTAL: 0.9 mg/dL (ref 0.2–1.2)
BUN: 19 mg/dL (ref 6–23)
CO2: 31 mEq/L (ref 19–32)
CREATININE: 0.9 mg/dL (ref 0.40–1.50)
Calcium: 8.9 mg/dL (ref 8.4–10.5)
Chloride: 102 mEq/L (ref 96–112)
GFR: 84.94 mL/min (ref >60.00)
GLUCOSE: 90 mg/dL (ref 70–99)
Potassium: 4.2 mEq/L (ref 3.5–5.1)
SODIUM: 137 meq/L (ref 135–145)
TOTAL PROTEIN: 6.5 g/dL (ref 6.0–8.3)

## 2017-12-11 LAB — CBC
HCT: 42.7 % (ref 39.0–52.0)
Hemoglobin: 14.4 g/dL (ref 13.0–17.0)
MCHC: 33.6 g/dL (ref 30.0–36.0)
MCV: 92.4 fl (ref 78.0–100.0)
Platelets: 122 10*3/uL — ABNORMAL LOW (ref 150.0–400.0)
RBC: 4.63 Mil/uL (ref 4.22–5.81)
RDW: 13.6 % (ref 11.5–15.5)
WBC: 4.6 10*3/uL (ref 4.0–10.5)

## 2017-12-25 ENCOUNTER — Ambulatory Visit (INDEPENDENT_AMBULATORY_CARE_PROVIDER_SITE_OTHER): Payer: Medicare Other | Admitting: *Deleted

## 2017-12-25 DIAGNOSIS — I4891 Unspecified atrial fibrillation: Secondary | ICD-10-CM | POA: Diagnosis not present

## 2017-12-25 DIAGNOSIS — Z7901 Long term (current) use of anticoagulants: Secondary | ICD-10-CM

## 2017-12-25 LAB — POCT INR: INR: 3.4

## 2017-12-25 NOTE — Patient Instructions (Signed)
Description   Skip tomorrow's dose, then Continue on same dosage 1 tablet every day except 1/2 tablet on Mondays. Recheck in 4 weeks. [facility]# [phone]

## 2018-01-06 DIAGNOSIS — H353132 Nonexudative age-related macular degeneration, bilateral, intermediate dry stage: Secondary | ICD-10-CM | POA: Diagnosis not present

## 2018-01-06 DIAGNOSIS — Z961 Presence of intraocular lens: Secondary | ICD-10-CM | POA: Diagnosis not present

## 2018-01-13 ENCOUNTER — Encounter: Payer: Self-pay | Admitting: Internal Medicine

## 2018-01-14 ENCOUNTER — Other Ambulatory Visit: Payer: Self-pay | Admitting: Internal Medicine

## 2018-01-20 ENCOUNTER — Ambulatory Visit (INDEPENDENT_AMBULATORY_CARE_PROVIDER_SITE_OTHER): Payer: Medicare Other | Admitting: Internal Medicine

## 2018-01-20 ENCOUNTER — Ambulatory Visit (INDEPENDENT_AMBULATORY_CARE_PROVIDER_SITE_OTHER): Payer: Medicare Other | Admitting: Pharmacist

## 2018-01-20 ENCOUNTER — Encounter: Payer: Self-pay | Admitting: Internal Medicine

## 2018-01-20 VITALS — BP 110/60 | HR 60 | Ht 69.0 in | Wt 160.4 lb

## 2018-01-20 DIAGNOSIS — I4891 Unspecified atrial fibrillation: Secondary | ICD-10-CM | POA: Diagnosis not present

## 2018-01-20 DIAGNOSIS — I481 Persistent atrial fibrillation: Secondary | ICD-10-CM | POA: Diagnosis not present

## 2018-01-20 DIAGNOSIS — I495 Sick sinus syndrome: Secondary | ICD-10-CM

## 2018-01-20 DIAGNOSIS — Z950 Presence of cardiac pacemaker: Secondary | ICD-10-CM | POA: Diagnosis not present

## 2018-01-20 DIAGNOSIS — Z7901 Long term (current) use of anticoagulants: Secondary | ICD-10-CM

## 2018-01-20 DIAGNOSIS — I4819 Other persistent atrial fibrillation: Secondary | ICD-10-CM

## 2018-01-20 LAB — POCT INR: INR: 3

## 2018-01-20 NOTE — Patient Instructions (Addendum)
Medication Instructions:  Your physician recommends that you continue on your current medications as directed. Please refer to the Current Medication list given to you today.  Labwork: None ordered.  Testing/Procedures: None ordered.  Follow-Up: Your physician recommends that you schedule a follow-up appointment in: One year with Chanetta Marshall, PA  Any Other Special Instructions Will Be Listed Below (If Applicable).     If you need a refill on your cardiac medications before your next appointment, please call your pharmacy.

## 2018-01-20 NOTE — Patient Instructions (Signed)
Description   Continue on same dosage 1 tablet every day except 1/2 tablet on Mondays. Recheck in 4 weeks. Cou[facility]in Clinic# [phone]

## 2018-01-20 NOTE — Progress Notes (Signed)
KG

## 2018-01-20 NOTE — Progress Notes (Signed)
Patient Care Team: Marin Olp, MD as PCP - General (Family Medicine)   HPI  Eric Lambert is a 84 y.o. 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    , Date Cr Hgb  1/19 0.9 14.4        He has no complaints of exercise intolerance.  He has had no bleeding issues.  He is interested in discussing NOACs as an alternative to warfarin.    No edema.  No chest pain.  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   Past Medical History:  Diagnosis Date  . Allergy   . Asthma    childhood  . Atrial fibrillation -permanent   . BPH (benign prostatic hyperplasia)   . CHF (congestive heart failure) ([place])    resolved after pacemaker - tachycardia induced  . Complete heart block ([place])   . ED (erectile dysfunction)   . GERD (gastroesophageal reflux disease)   . GLUCOSE INTOLERANCE 10/22/2007   no recent issues  . OSA (obstructive sleep apnea)   . Pacemaker BSX    dual  . SUBACUTE BACTERIAL ENDOCARDITIS 1970s    Past Surgical History:  Procedure Laterality Date  . INSERT / REPLACE / REMOVE PACEMAKER    . PACEMAKER GENERATOR CHANGE N/A 05/07/2012   Procedure: PACEMAKER GENERATOR CHANGE;  Surgeon: [name], MD;  Location: [facility] CATH LAB;  Service: Cardiovascular;  Laterality: N/A;  . PACEMAKER PLACEMENT    . PARTIAL HIP ARTHROPLASTY     2008    Current Outpatient Medications  Medication Sig Dispense Refill  . cholecalciferol (VITAMIN D) 1000 UNITS tablet Take 1,000 Units by mouth daily.     Marland Kitchen lisinopril (PRINIVIL,ZESTRIL) 10 MG tablet Take 1 tablet (10 mg total) by mouth daily. 90 tablet 3  . verapamil (CALAN-SR) 240 MG CR tablet TAKE 1 TABLET BY MOUTH ONCE DAILY 90 tablet 0  . warfarin (COUMADIN) 5 MG tablet USE AS DIRECTED [facility] 100 tablet  0   No current facility-administered medications for this visit.     No Known Allergies  Review of Systems negative except from HPI and PMH  Physical Exam BP 110/60   Pulse 60   Ht 5\' 9"  (1.753 m)   Wt 160 lb 6.4 oz (72.8 kg)   SpO2 98%   BMI 23.69 kg/m  Well developed and nourished in no acute distress HENT normal Neck supple with JVP-flat Clear Regular rate and rhythm, no murmurs or gallops Abd-soft with active BS No Clubbing cyanosis edema Skin-warm and dry A & Oriented  Grossly normal sensory and motor function   ECG demonstrates atrial fibrillation with ventricular pacing Intervals-/19/50  Assessment and  Plan  Atrial fibrillation  Permanent    Pacemaker-[employer]  The patient's device was interrogated.  The information was reviewed. No changes were made in the programming.     Bradycardia  Hypertension   On Anticoagulation;  No bleeding issues   BP well controlled  We discussed the use of the NOACs compared to Coumadin. We briefly reviewed the data of at least comparability in stroke prevention, bleeding and outcome. We discussed some of the new once wherein somewhat associated with decreased ischemic stroke risk, one to be taken daily, and has been shown to be comparable and bleeding risk to  aspirin.  We also discussed bleeding associated with warfarin as well as NOACs and a wall bleeding as a complication of all these drugs intracranial bleeding is more frequently associated with warfarin then the NOACs and a GI bleeding is more commonly associated with the latter  He will let us know  We spent more than 50% of our >25 min visit in face to face counseling regarding the above

## 2018-01-21 ENCOUNTER — Telehealth: Payer: Self-pay | Admitting: Internal Medicine

## 2018-01-21 NOTE — Telephone Encounter (Signed)
Patient calling, states that at his visit yesterday with Dr. Caryl Comes it was mentioned that he could switch from Warfarin to Eliquis. Patient states that he needs prescription for Eliquis.

## 2018-01-22 ENCOUNTER — Telehealth: Payer: Self-pay | Admitting: Internal Medicine

## 2018-01-22 ENCOUNTER — Telehealth: Payer: Self-pay | Admitting: Family Medicine

## 2018-01-22 NOTE — Telephone Encounter (Signed)
Copied from [place] [phone]. Topic: Quick Communication - See Telephone Encounter >> Jan 22, 2018  1:53 PM Vernona Rieger wrote: CRM for notification. See Telephone encounter for:   01/22/18.  Pt wants to know if he can switch from warfarin (COUMADIN) 5 MG tablet to eliquis. Please advise.  [phone]

## 2018-01-22 NOTE — Telephone Encounter (Signed)
F/U Call:  Patient states that he was told that he would receive a visit from [facility] yesterday but he did not receive a visit as of yet.

## 2018-01-22 NOTE — Telephone Encounter (Signed)
See note

## 2018-01-22 NOTE — Telephone Encounter (Signed)
LM that we were contacting [facility] at this time and he should expect to hear from him either today or Monday 01/25/18. Left call back number if needed.

## 2018-01-22 NOTE — Telephone Encounter (Signed)
Fax sent 3/01 @ [phone].

## 2018-01-25 ENCOUNTER — Telehealth: Payer: Self-pay | Admitting: Internal Medicine

## 2018-01-25 NOTE — Telephone Encounter (Signed)
Error  Pt verbalized that 01/20/2018 he was suppose to start a new prescription   Of Eliquis and that the prescription still has not went to the pharmacy   The mg and dose is not known please advise

## 2018-01-25 NOTE — Telephone Encounter (Signed)
Returned call to patient who states that he was suppose to transition from coumadin to Eliquis. Patient states that he was told that he was going to have an Rx sent in for Eliquis. Patient states that his pharmacy did not receive the Rx. Patient states that he wants to wait until he finishes his supply of coumadin and correlate his transition with his coumadin appointment on 4/10. Patient states that he has enough supply of coumadin to last him until his appointment. Patient made aware that the Rx would be sent to his pharmacy at his appointment. Patient verbalized understanding.

## 2018-01-26 NOTE — Telephone Encounter (Signed)
Called patient and left a message asking for a return phone call 

## 2018-01-26 NOTE — Telephone Encounter (Signed)
His coumadin is managed by cardiology- appears they are addressing this. Can you just call patient to inform him that since cardiology is prescribing that they should manage his transition?

## 2018-01-26 NOTE — Telephone Encounter (Signed)
Called pt today to reschedule his next INR visit so he can be transitioned to Eliquis sooner.   Per Dr Caryl Comes: Pt may transition from Warfarin to Eliquis 5mg  tablet bid  Pt verbalized understanding and had no additional questions.

## 2018-01-26 NOTE — Telephone Encounter (Signed)
CRM created to inform patient

## 2018-02-06 ENCOUNTER — Other Ambulatory Visit: Payer: Self-pay | Admitting: Internal Medicine

## 2018-02-12 DIAGNOSIS — M1612 Unilateral primary osteoarthritis, left hip: Secondary | ICD-10-CM | POA: Diagnosis not present

## 2018-03-01 ENCOUNTER — Ambulatory Visit (INDEPENDENT_AMBULATORY_CARE_PROVIDER_SITE_OTHER): Payer: Medicare Other | Admitting: *Deleted

## 2018-03-01 ENCOUNTER — Encounter (INDEPENDENT_AMBULATORY_CARE_PROVIDER_SITE_OTHER): Payer: Self-pay

## 2018-03-01 DIAGNOSIS — I4891 Unspecified atrial fibrillation: Secondary | ICD-10-CM

## 2018-03-01 DIAGNOSIS — I481 Persistent atrial fibrillation: Secondary | ICD-10-CM

## 2018-03-01 DIAGNOSIS — Z7901 Long term (current) use of anticoagulants: Secondary | ICD-10-CM | POA: Diagnosis not present

## 2018-03-01 DIAGNOSIS — I4819 Other persistent atrial fibrillation: Secondary | ICD-10-CM

## 2018-03-01 LAB — POCT INR: INR: 2.1

## 2018-03-01 MED ORDER — APIXABAN 5 MG PO TABS: 5.0000 mg | ORAL_TABLET | Freq: Two times a day (BID) | ORAL | 0 refills | Status: DC

## 2018-03-01 NOTE — Patient Instructions (Signed)
Description   Stop taking Coumadin/Warfarin and start Eliquis 5mg  twice a day on 03/02/18. Recheck in 4 weeks for labs for Eliquis follow-up. [facility]# [phone]

## 2018-03-11 LAB — CUP PACEART INCLINIC DEVICE CHECK
Date Time Interrogation Session: 20190227050000
Implantable Lead Implant Date: 19960809
Implantable Lead Location: 753859
Implantable Lead Location: 753860
Implantable Lead Serial Number: 209144
Implantable Pulse Generator Implant Date: 20130614
Lead Channel Impedance Value: 850 Ohm
Lead Channel Sensing Intrinsic Amplitude: 9 mV
Lead Channel Setting Pacing Amplitude: 2.4 V
Lead Channel Setting Sensing Sensitivity: 2.5 mV
MDC IDC LEAD IMPLANT DT: 19960809
MDC IDC MSMT LEADCHNL RV PACING THRESHOLD AMPLITUDE: 1 V
MDC IDC MSMT LEADCHNL RV PACING THRESHOLD PULSEWIDTH: 0.4 ms
MDC IDC SET LEADCHNL RV PACING PULSEWIDTH: 0.4 ms
MDC IDC STAT BRADY RV PERCENT PACED: 70 %
Pulse Gen Serial Number: 115653

## 2018-03-29 ENCOUNTER — Ambulatory Visit (INDEPENDENT_AMBULATORY_CARE_PROVIDER_SITE_OTHER): Payer: Medicare Other | Admitting: Pharmacist

## 2018-03-29 DIAGNOSIS — Z7901 Long term (current) use of anticoagulants: Secondary | ICD-10-CM

## 2018-03-29 DIAGNOSIS — I4891 Unspecified atrial fibrillation: Secondary | ICD-10-CM | POA: Diagnosis not present

## 2018-03-29 LAB — BASIC METABOLIC PANEL
BUN/Creatinine Ratio: 18 (ref 10–24)
BUN: 20 mg/dL (ref 8–27)
CALCIUM: 9.1 mg/dL (ref 8.6–10.2)
CO2: 26 mmol/L (ref 20–29)
CREATININE: 1.12 mg/dL (ref 0.76–1.27)
Chloride: 99 mmol/L (ref 96–106)
GFR calc Af Amer: 68 mL/min/{1.73_m2} (ref >59)
GFR calc non Af Amer: 59 mL/min/{1.73_m2} — ABNORMAL LOW (ref >59)
GLUCOSE: 106 mg/dL — AB (ref 65–99)
Potassium: 4.7 mmol/L (ref 3.5–5.2)
Sodium: 139 mmol/L (ref 134–144)

## 2018-03-29 LAB — CBC
HEMATOCRIT: 42 % (ref 37.5–51.0)
HEMOGLOBIN: 14.4 g/dL (ref 13.0–17.7)
MCH: 30.6 pg (ref 26.6–33.0)
MCHC: 34.3 g/dL (ref 31.5–35.7)
MCV: 89 fL (ref 79–97)
Platelets: 142 10*3/uL — ABNORMAL LOW (ref 150–379)
RBC: 4.7 x10E6/uL (ref 4.14–5.80)
RDW: 13.9 % (ref 12.3–15.4)
WBC: 4.8 10*3/uL (ref 3.4–10.8)

## 2018-03-29 NOTE — Progress Notes (Signed)
Pt was started on Eliquis for afib on 03/01/18.  Reviewed patients medication list.  Pt is not currently on any combined P-gp and strong CYP3A4 inhibitors/inducers (ketoconazole, traconazole, ritonavir, carbamazepine, phenytoin, rifampin, St. John's wort).  Reviewed labs.  SCr 1.12, Weight 154 lbs 12 oz, age 84. Dose appropriate based on age, weight, and SCr.  Hgb and HCT WNL.  A full discussion of the nature of anticoagulants has been carried out.  A benefit/risk analysis has been presented to the patient, so that they understand the justification for choosing anticoagulation with Eliquis at this time.  The need for compliance is stressed.  Pt is aware to take the medication twice daily.  Side effects of potential bleeding are discussed, including unusual colored urine or stools, coughing up blood or coffee ground emesis, nose bleeds or serious fall or head trauma.  Discussed signs and symptoms of stroke. The patient should avoid any OTC items containing aspirin or ibuprofen.  Avoid alcohol consumption.   Call if any signs of abnormal bleeding.  Discussed financial obligations and resolved any difficulty in obtaining medication.  Next lab test in 6 months.   LMOM for pt that labs are stable and to continue current medications. Already scheduled for 6 month follow up.

## 2018-04-16 ENCOUNTER — Other Ambulatory Visit: Payer: Self-pay | Admitting: Internal Medicine

## 2018-04-22 ENCOUNTER — Telehealth: Payer: Self-pay

## 2018-04-22 ENCOUNTER — Encounter: Payer: Medicare Other | Admitting: *Deleted

## 2018-04-22 NOTE — Telephone Encounter (Signed)
LMOVM reminding pt to send remote transmission.   

## 2018-04-23 ENCOUNTER — Encounter: Payer: Self-pay | Admitting: Cardiology

## 2018-04-23 ENCOUNTER — Other Ambulatory Visit: Payer: Self-pay | Admitting: Internal Medicine

## 2018-05-21 DIAGNOSIS — D1801 Hemangioma of skin and subcutaneous tissue: Secondary | ICD-10-CM | POA: Diagnosis not present

## 2018-05-21 DIAGNOSIS — L570 Actinic keratosis: Secondary | ICD-10-CM | POA: Diagnosis not present

## 2018-05-21 DIAGNOSIS — Z85828 Personal history of other malignant neoplasm of skin: Secondary | ICD-10-CM | POA: Diagnosis not present

## 2018-05-21 DIAGNOSIS — L821 Other seborrheic keratosis: Secondary | ICD-10-CM | POA: Diagnosis not present

## 2018-06-09 ENCOUNTER — Ambulatory Visit (INDEPENDENT_AMBULATORY_CARE_PROVIDER_SITE_OTHER): Payer: Medicare Other | Admitting: Family Medicine

## 2018-06-09 ENCOUNTER — Encounter: Payer: Self-pay | Admitting: Family Medicine

## 2018-06-09 DIAGNOSIS — M545 Low back pain, unspecified: Secondary | ICD-10-CM

## 2018-06-09 DIAGNOSIS — G8929 Other chronic pain: Secondary | ICD-10-CM | POA: Diagnosis not present

## 2018-06-09 DIAGNOSIS — H8110 Benign paroxysmal vertigo, unspecified ear: Secondary | ICD-10-CM | POA: Diagnosis not present

## 2018-06-09 DIAGNOSIS — I481 Persistent atrial fibrillation: Secondary | ICD-10-CM

## 2018-06-09 DIAGNOSIS — I10 Essential (primary) hypertension: Secondary | ICD-10-CM | POA: Diagnosis not present

## 2018-06-09 DIAGNOSIS — I4819 Other persistent atrial fibrillation: Secondary | ICD-10-CM

## 2018-06-09 NOTE — Assessment & Plan Note (Signed)
S: remains on verapamil for rate control. On eliquis per cardiology. Pacemaker due to tachybrady syndrome.  A/P: continue current meds long term

## 2018-06-09 NOTE — Patient Instructions (Addendum)
No changes today  You are doing great  6-12 months or sooner if you need [place]  You declined wellness visit emphatically- let us know if you change your mind

## 2018-06-09 NOTE — Assessment & Plan Note (Signed)
S: controlled on lisinopril 10mg , verapamil 240mg  XR in PM for both. No lightheadedness with current bp BP Readings from Last 3 Encounters:  06/09/18 (!) 102/54  01/20/18 110/60  12/10/17 110/68  A/P: We discussed blood pressure goal of <140/90. Continue current meds. Could consider reducing lisinopril in future if remains this low

## 2018-06-09 NOTE — Assessment & Plan Note (Signed)
S: BPPV has resolved. States he didn't have to do the vestibulat rehab. He hit the home exercises harder A/P: great news- discussed could restart home exercises if recurs

## 2018-06-09 NOTE — Assessment & Plan Note (Signed)
S:  Years of back issues. If he does a lot of bending over it seems to bother him worse- gets more twinges. Has been told arthritis in past. Rest will help reduce the issue. Poor posture at times and leans forward which creates more strain on low back. Has not tried icy hot or biofreeze.  A/P: we discussed trying to improve posture. Since on and off issues- no further workup at this point.

## 2018-06-09 NOTE — Progress Notes (Signed)
Subjective:  Eric Lambert is a 84 y.o. year old very pleasant male patient who presents for/with See problem oriented charting ROS- low back pain noted. No edema. No shortness of breath reported. No chest pain reported   Past Medical History-  Patient Active Problem List   Diagnosis Date Noted  . PPM-[employer] 02/07/2009    Priority: High  . ATRIAL FIBRILLATION 10/21/2007    Priority: High  . BPPV (benign paroxysmal positional vertigo) 12/10/2017    Priority: Medium  . Thrombocytopenia ([place]) 08/04/2011    Priority: Medium  . Obstructive sleep apnea 10/21/2007    Priority: Medium  . Essential hypertension 10/21/2007    Priority: Medium  . BPH associated with nocturia 10/21/2007    Priority: Medium  . Erectile dysfunction 05/28/2016    Priority: Low  . Macular degeneration 05/28/2016    Priority: Low  . Right hip pain 08/04/2013    Priority: Low  . Secondary cardiomyopathy ([place]) 02/07/2009    Priority: Low  . Osteopenia 10/22/2007    Priority: Low  . SUBACUTE BACTERIAL ENDOCARDITIS 10/21/2007    Priority: Low  . GERD 10/21/2007    Priority: Low  . Chronic low back pain 06/09/2018  . Right knee pain 06/02/2017    Medications- reviewed and updated Current Outpatient Medications  Medication Sig Dispense Refill  . cholecalciferol (VITAMIN D) 1000 UNITS tablet Take 1,000 Units by mouth daily.     Marland Kitchen ELIQUIS 5 MG TABS tablet TAKE 1 TABLET BY MOUTH TWICE DAILY 60 tablet 5  . lisinopril (PRINIVIL,ZESTRIL) 10 MG tablet Take 1 tablet (10 mg total) by mouth daily. 90 tablet 3  . verapamil (CALAN-SR) 240 MG CR tablet TAKE 1 TABLET BY MOUTH ONCE DAILY 90 tablet 2   No current facility-administered medications for this visit.     Objective: BP (!) 102/54 (BP Location: Left Arm, Patient Position: Sitting, Cuff Size: Large)   Pulse 67   Temp 98.4 F (36.9 C) (Oral)   Ht 5\' 9"  (1.753 m)   Wt 156 lb 9.6 oz (71 kg)   SpO2 97%   BMI 23.13 kg/m  Gen: NAD, resting  comfortably, appears younger than stated age CV: RRR no murmurs rubs or gallops Lungs: CTAB no crackles, wheeze, rhonchi Abdomen: soft/nontender/nondistended/normal bowel sounds. No rebound or guarding.  Ext: no edema Skin: warm, dry  Assessment/Plan:  Essential hypertension S: controlled on lisinopril 10mg , verapamil 240mg  XR in PM for both. No lightheadedness with current bp BP Readings from Last 3 Encounters:  06/09/18 (!) 102/54  01/20/18 110/60  12/10/17 110/68  A/P: We discussed blood pressure goal of <140/90. Continue current meds. Could consider reducing lisinopril in future if remains this low  ATRIAL FIBRILLATION S: remains on verapamil for rate control. On eliquis per cardiology. Pacemaker due to tachybrady syndrome.  A/P: continue current meds long term  BPPV (benign paroxysmal positional vertigo) S: BPPV has resolved. States he didn't have to do the vestibulat rehab. He hit the home exercises harder A/P: great news- discussed could restart home exercises if recurs  Chronic low back pain S:  Years of back issues. If he does a lot of bending over it seems to bother him worse- gets more twinges. Has been told arthritis in past. Rest will help reduce the issue. Poor posture at times and leans forward which creates more strain on low back. Has not tried icy hot or biofreeze.  A/P: we discussed trying to improve posture. Since on and off issues- no further workup  at this point.    Future Appointments  Date Time Provider [place]  07/27/2018  7:10 AM [facility]  09/29/2018  9:30 AM [facility]  03/11/2019 10:00 AM Marin Olp, MD [facility]  likely update labs next visit  Return precautions advised.  Garret Reddish, MD

## 2018-07-06 DIAGNOSIS — H524 Presbyopia: Secondary | ICD-10-CM | POA: Diagnosis not present

## 2018-07-06 DIAGNOSIS — Z961 Presence of intraocular lens: Secondary | ICD-10-CM | POA: Diagnosis not present

## 2018-07-06 DIAGNOSIS — H353132 Nonexudative age-related macular degeneration, bilateral, intermediate dry stage: Secondary | ICD-10-CM | POA: Diagnosis not present

## 2018-07-07 ENCOUNTER — Ambulatory Visit (INDEPENDENT_AMBULATORY_CARE_PROVIDER_SITE_OTHER): Payer: Medicare Other | Admitting: Family Medicine

## 2018-07-07 ENCOUNTER — Encounter: Payer: Self-pay | Admitting: Family Medicine

## 2018-07-07 VITALS — BP 118/64 | HR 72 | Temp 98.0°F | Ht 69.0 in | Wt 159.0 lb

## 2018-07-07 DIAGNOSIS — J40 Bronchitis, not specified as acute or chronic: Secondary | ICD-10-CM | POA: Diagnosis not present

## 2018-07-07 MED ORDER — AZITHROMYCIN 250 MG PO TABS: ORAL_TABLET | ORAL | 0 refills | Status: DC

## 2018-07-07 NOTE — Progress Notes (Addendum)
   Subjective:  Eric Lambert is a 84 y.o. male who presents today for same-day appointment with a chief complaint of cough.   HPI:  Cough, Acute problem Started 2 weeks ago. Worsening over that time. Associated symptoms include rhinorrhea, sore throat, sputum production, and malaise. No treatments tried. Wife has been sick with similar symptoms. No obvious alleviating or aggravating factors.  No chest pain.  ROS: Per HPI  PMH: He reports that he has never smoked. He has never used smokeless tobacco. He reports that he does not drink alcohol or use drugs.  Objective:  Physical Exam: BP 118/64 (BP Location: Left Arm, Patient Position: Sitting, Cuff Size: Normal)   Pulse 72   Temp 98 F (36.7 C) (Oral)   Ht 5\' 9"  (1.753 m)   Wt 159 lb (72.1 kg)   SpO2 97%   BMI 23.48 kg/m   Gen: NAD, resting comfortably HEENT: TMs clear bilaterally.  Oropharynx clear. CV: Irregular rhythm with no murmurs appreciated Pulm: NWOB, occasional rhonchi with end expiratory wheeze scattered throughout  Assessment/Plan:  Bronchitis No red flag signs or symptoms.  Vital signs are stable.  Given length of illness in addition to pulmonary exam findings, we will start empiric azithromycin today.  Offered anti-inflammatory steroid injection, however patient declined.  Would avoid beta agonist given history of atrial fibrillation.  Recommended supportive measures including good oral hydration and Tylenol as needed.  Discussed reasons to return to care.  Follow-up as needed.  Algis Greenhouse. Jerline Pain, MD 07/07/2018 12:00 PM

## 2018-07-07 NOTE — Patient Instructions (Signed)
Start the zpack.  Please stay well hydrated.  You can take tylenol  as needed for low grade fever and pain.  Please let me know if your symptoms worsen or fail to improve.  Take care, Dr Jerline Pain

## 2018-07-27 ENCOUNTER — Ambulatory Visit (INDEPENDENT_AMBULATORY_CARE_PROVIDER_SITE_OTHER): Payer: Medicare Other | Admitting: *Deleted

## 2018-07-27 DIAGNOSIS — I495 Sick sinus syndrome: Secondary | ICD-10-CM | POA: Diagnosis not present

## 2018-07-27 NOTE — Progress Notes (Signed)
Remote pacemaker transmission.   

## 2018-07-29 ENCOUNTER — Telehealth: Payer: Self-pay | Admitting: *Deleted

## 2018-07-29 NOTE — Telephone Encounter (Signed)
Copied from [place] [phone]. Topic: General - Other >> Jul 29, 2018  2:16 PM Ivar Drape wrote: Reason for CRM:   Patient would like a call back to discuss genetic testing for cancer markings.

## 2018-08-02 NOTE — Telephone Encounter (Signed)
Called and spoke with patient who states he received a phone call from a sales person recommending genetic cancer screenings.  He stated it was not from a physician. We discussed that it likely sounds like a scam. He stated if Dr. Yong Channel thought he should have testing he would, otherwise he is not going to worry about it.

## 2018-08-17 LAB — CUP PACEART REMOTE DEVICE CHECK
Implantable Lead Implant Date: 19960809
Implantable Lead Implant Date: 19960809
Implantable Lead Location: 753859
Implantable Lead Model: 4285
Implantable Lead Serial Number: 209144
Implantable Pulse Generator Implant Date: 20130614
MDC IDC LEAD LOCATION: 753860
MDC IDC PG SERIAL: 115653
MDC IDC SESS DTM: 20190924092647

## 2018-09-17 DIAGNOSIS — Z23 Encounter for immunization: Secondary | ICD-10-CM | POA: Diagnosis not present

## 2018-10-07 ENCOUNTER — Ambulatory Visit (INDEPENDENT_AMBULATORY_CARE_PROVIDER_SITE_OTHER): Payer: Medicare Other

## 2018-10-07 DIAGNOSIS — Z5181 Encounter for therapeutic drug level monitoring: Secondary | ICD-10-CM

## 2018-10-07 DIAGNOSIS — I4891 Unspecified atrial fibrillation: Secondary | ICD-10-CM | POA: Diagnosis not present

## 2018-10-07 NOTE — Progress Notes (Signed)
Pt was started on Eliquis 5mg  BID by Dr Caryl Comes for afib on 03/01/18.    Reviewed patients medication list.  Pt is not currently on any combined P-gp and strong CYP3A4 inhibitors/inducers (ketoconazole, traconazole, ritonavir, carbamazepine, phenytoin, rifampin, St. John's wort).  Reviewed labs: SCr-1.01, Hgb-14.3, HCT-43.9, Weight 72.1kg, Age-89.  Dose appropriate based on age, weight, and SCr.  Hgb and HCT within normal limits.   A full discussion of the nature of anticoagulants has been carried out.  A benefit/risk analysis has been presented to the patient, so that they understand the justification for choosing anticoagulation with Eliquis at this time.  The need for compliance is stressed.  Pt is aware to take the medication twice daily.  Side effects of potential bleeding are discussed, including unusual colored urine or stools, coughing up blood or coffee ground emesis, nose bleeds or serious fall or head trauma.  Discussed signs and symptoms of stroke. The patient should avoid any OTC items containing aspirin or ibuprofen.  Avoid alcohol consumption.   Call if any signs of abnormal bleeding.  Discussed financial obligations and resolved any difficulty in obtaining medication.   Spoke with pt who is aware to continue taking Eliquis 5mg  twice a day and follow-up with Dr. Caryl Comes per his recommendations.

## 2018-10-07 NOTE — Patient Instructions (Signed)
A full discussion of the nature of anticoagulants has been carried out.  A benefit/risk analysis has been presented to the patient, so that they understand the justification for choosing anticoagulation with Eliquis at this time.  The need for compliance is stressed.  Pt is aware to take the medication twice daily.  Side effects of potential bleeding are discussed, including unusual colored urine or stools, coughing up blood or coffee ground emesis, nose bleeds or serious fall or head trauma.  Discussed signs and symptoms of stroke. The patient should avoid any OTC items containing aspirin or ibuprofen.  Avoid alcohol consumption.   Call if any signs of abnormal bleeding.  Discussed financial obligations and resolved any difficulty in obtaining medication.

## 2018-10-08 ENCOUNTER — Other Ambulatory Visit: Payer: Self-pay | Admitting: Pharmacist

## 2018-10-08 LAB — BASIC METABOLIC PANEL
BUN/Creatinine Ratio: 28 — ABNORMAL HIGH (ref 10–24)
BUN: 28 mg/dL — AB (ref 8–27)
CALCIUM: 9.2 mg/dL (ref 8.6–10.2)
CO2: 24 mmol/L (ref 20–29)
CREATININE: 1.01 mg/dL (ref 0.76–1.27)
Chloride: 100 mmol/L (ref 96–106)
GFR, EST AFRICAN AMERICAN: 77 mL/min/{1.73_m2} (ref >59)
GFR, EST NON AFRICAN AMERICAN: 67 mL/min/{1.73_m2} (ref >59)
Glucose: 103 mg/dL — ABNORMAL HIGH (ref 65–99)
Potassium: 4.5 mmol/L (ref 3.5–5.2)
Sodium: 139 mmol/L (ref 134–144)

## 2018-10-08 LAB — CBC
HEMATOCRIT: 43.9 % (ref 37.5–51.0)
HEMOGLOBIN: 14.3 g/dL (ref 13.0–17.7)
MCH: 30 pg (ref 26.6–33.0)
MCHC: 32.6 g/dL (ref 31.5–35.7)
MCV: 92 fL (ref 79–97)
PLATELETS: 169 10*3/uL (ref 150–450)
RBC: 4.76 x10E6/uL (ref 4.14–5.80)
RDW: 12.6 % (ref 12.3–15.4)
WBC: 6.2 10*3/uL (ref 3.4–10.8)

## 2018-10-08 MED ORDER — APIXABAN 5 MG PO TABS: 5.0000 mg | ORAL_TABLET | Freq: Two times a day (BID) | ORAL | 5 refills | Status: DC

## 2018-10-27 ENCOUNTER — Ambulatory Visit (INDEPENDENT_AMBULATORY_CARE_PROVIDER_SITE_OTHER): Payer: Medicare Other

## 2018-10-27 DIAGNOSIS — I495 Sick sinus syndrome: Secondary | ICD-10-CM

## 2018-10-28 NOTE — Progress Notes (Signed)
Remote pacemaker transmission.   

## 2018-11-02 ENCOUNTER — Encounter: Payer: Self-pay | Admitting: Cardiology

## 2018-11-29 ENCOUNTER — Telehealth: Payer: Self-pay | Admitting: Internal Medicine

## 2018-11-29 NOTE — Telephone Encounter (Signed)
New message   Pt is getting fillings (dental work) done tomorrow 1/7  and wants to know due to heart condition , if its ok to take antibiotics before . Please follow up

## 2018-11-29 NOTE — Telephone Encounter (Signed)
Spoke with the patient, he wanted to know if he should continue to take preventive antibiotics before dental procedures since he had bacterial endocarditis in 1970s. The patient is going to take his prevention this time, but wants clarification.  Sending to Dr. Caryl Comes for recommendations.

## 2018-11-30 NOTE — Telephone Encounter (Signed)
LVM for return call. 

## 2018-12-01 ENCOUNTER — Telehealth: Payer: Self-pay | Admitting: Internal Medicine

## 2018-12-01 NOTE — Telephone Encounter (Signed)
Per Dr Caryl Comes, it is indicated for pt to take prophylactic antibiotics before dental procedures with his history of endocarditis, regardless of when he had it. Pt has been notified and verbalized understanding.

## 2018-12-01 NOTE — Telephone Encounter (Signed)
New Message   Wants you to return phone call about your previous conversation with him

## 2018-12-01 NOTE — Telephone Encounter (Signed)
See previous encounter

## 2018-12-10 ENCOUNTER — Ambulatory Visit: Payer: Medicare Other | Admitting: Family Medicine

## 2018-12-15 LAB — CUP PACEART REMOTE DEVICE CHECK
Battery Remaining Longevity: 66 mo
Battery Remaining Percentage: 76 %
Brady Statistic RV Percent Paced: 62 %
Date Time Interrogation Session: 20191204182500
Implantable Lead Implant Date: 19960809
Implantable Lead Implant Date: 19960809
Implantable Lead Location: 753859
Implantable Lead Location: 753860
Implantable Lead Model: 4285
Implantable Pulse Generator Implant Date: 20130614
Lead Channel Impedance Value: 792 Ohm
Lead Channel Setting Pacing Amplitude: 2.4 V
Lead Channel Setting Pacing Pulse Width: 0.4 ms
Lead Channel Setting Sensing Sensitivity: 2.5 mV
MDC IDC LEAD SERIAL: 209144
Pulse Gen Serial Number: 115653

## 2018-12-16 ENCOUNTER — Encounter: Payer: Self-pay | Admitting: Nurse Practitioner

## 2018-12-29 DIAGNOSIS — H353112 Nonexudative age-related macular degeneration, right eye, intermediate dry stage: Secondary | ICD-10-CM | POA: Diagnosis not present

## 2018-12-29 DIAGNOSIS — H353121 Nonexudative age-related macular degeneration, left eye, early dry stage: Secondary | ICD-10-CM | POA: Diagnosis not present

## 2019-01-04 NOTE — Progress Notes (Signed)
Electrophysiology Office Note Date: 01/07/2019  ID:  Eric Lambert, DOB [birth year], MRN [id number]  PCP: Eric Olp, MD Electrophysiologist: Eric Lambert  CC: Pacemaker follow-up  Eric Lambert is a 85 y.o. male seen today for Dr Eric Lambert.  He presents today for routine electrophysiology followup.  Since last being seen in our clinic, the patient reports doing very well.  He denies chest pain, palpitations, dyspnea, PND, orthopnea, nausea, vomiting, dizziness, syncope, edema, weight gain, or early satiety.  Device History: BSX dual chamber PPM implanted 1996 for tachy/brady; gen change 2003; gen change 2013   Past Medical History:  Diagnosis Date  . Allergy   . Asthma    childhood  . Atrial fibrillation -permanent   . BPH (benign prostatic hyperplasia)   . CHF (congestive heart failure) ([place])    resolved after pacemaker - tachycardia induced  . Complete heart block ([place])   . ED (erectile dysfunction)   . GERD (gastroesophageal reflux disease)   . GLUCOSE INTOLERANCE 10/22/2007   no recent issues  . OSA (obstructive sleep apnea)   . Pacemaker BSX    dual  . SUBACUTE BACTERIAL ENDOCARDITIS 1970s   Past Surgical History:  Procedure Laterality Date  . INSERT / REPLACE / REMOVE PACEMAKER    . PACEMAKER GENERATOR CHANGE N/A 05/07/2012   Procedure: PACEMAKER GENERATOR CHANGE;  Surgeon: Eric Sprang, MD;  Location[facility] C[facility] LAB;  Service: Cardiovascular;  Laterality: N/A;  . PACEMAKER PLACEMENT    . PARTIAL HIP ARTHROPLASTY     2008    Current Outpatient Medications  Medication Sig Dispense Refill  . apixaban (ELIQUIS) 5 MG TABS tablet Take 1 tablet (5 mg total) by mouth 2 (two) times daily. 60 tablet 5  . cholecalciferol (VITAMIN D) 1000 UNITS tablet Take 1,000 Units by mouth daily.     Marland Kitchen lisinopril (PRINIVIL,ZESTRIL) 10 MG tablet Take 1 tablet (10 mg total) by mouth daily. 90 tablet 3  . verapamil (CALAN-SR) 240 MG CR tablet TAKE 1 TABLET BY MOUTH ONCE  DAILY 90 tablet 2   No current facility-administered medications for this visit.     Allergies:   Patient has no known allergies.   Social History: Social History   Socioeconomic History  . Marital status: Married    Spouse name: Not on file  . Number of children: 2  . Years of education: Not on file  . Highest education level: Not on file  Occupational History  . 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 Financial resource strain: Not on file  . Food insecurity:    Worry: Not on file    Inability: Not on file  . Transportation needs:    Medical: Not on file    Non-medical: Not on file  Tobacco Use  . Smoking status: Never Smoker  . Smokeless tobacco: Never Used  Substance and Sexual Activity  . Alcohol use: No  . Drug use: No  . Sexual activity: Not on file  Lifestyle  . Physical activity:    Days per week: Not on file    Minutes per session: Not on file  . Stress: Not on file  Relationships  . Social connections:    Talks on phone: Not on file    Gets together: Not on file    Attends religious service: Not on file    Active member of club or organization: Not on file  Attends meetings of clubs or organizations: Not on file    Relationship status: Not on file  . Intimate partner violence:    Fear of current or ex partner: Not on file    Emotionally abused: Not on file    Physically abused: Not on file    Forced sexual activity: Not on file  Other Topics Concern  . Not on file  Social History Narrative   Married. 2 children. 1 grandkid. 1 greatgrandchild.       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, plays bridge on internet, watch reruns, enjoys talking politics    Family History: Family History  Problem Relation Age of Onset  . Prostate cancer Father   . Heart disease Other        mothers side men- strokes and heart attacks  . Diabetes Brother   .  Prostate cancer Brother   . Brain cancer Brother      Review of Systems: All other systems reviewed and are otherwise negative except as noted above.   Physical Exam: VS:  BP 130/72   Pulse 64   Ht 5\' 9"  (1.753 m)   Wt 160 lb 3.2 oz (72.7 kg)   SpO2 98%   BMI 23.66 kg/m  , BMI Body mass index is 23.66 kg/m.  GEN- The patient is elderly appearing, alert and oriented x 3 today.   HEENT: normocephalic, atraumatic; sclera clear, conjunctiva pink; hearing intact; oropharynx clear; neck supple  Lungs- Clear to ausculation bilaterally, normal work of breathing.  No wheezes, rales, rhonchi Heart- Regular rate and rhythm, no murmurs, rubs or gallops  GI- soft, non-tender, non-distended, bowel sounds present  Extremities- no clubbing, cyanosis, or edema  MS- no significant deformity or atrophy Skin- warm and dry, no rash or lesion; PPM pocket well healed Psych- euthymic mood, full affect Neuro- strength and sensation are intact  PPM Interrogation- reviewed in detail today,  See PACEART report  EKG:  EKG is not ordered today.  Recent Labs: 10/07/2018: BUN 28; Creatinine, Ser 1.01; Hemoglobin 14.3; Platelets 169; Potassium 4.5; Sodium 139   Wt Readings from Last 3 Encounters:  01/05/19 160 lb 3.2 oz (72.7 kg)  07/07/18 159 lb (72.1 kg)  06/09/18 156 lb 9.6 oz (71 kg)     Other studies Reviewed: Additional studies/ records that were reviewed today include: Dr Olin Pia office notes   Assessment and Plan:  1.  Symptomatic bradycardia Normal PPM function See Pace Art report No changes today  2.  Permanent atrial fibrillation Continue Eliquis for CHADS2VASC of 3 CBC, BMET reviewed from 09/2018  3.  HTN Stable No change required today   Current medicines are reviewed at length with the patient today.   The patient does not have concerns regarding his medicines.  The following changes were made today:  none  Labs/ tests ordered today include: none No orders of the  defined types were placed in this encounter.    Disposition:   Follow up with [facility], Dr Eric Lambert 1 year    Signed, Eric Marshall, NP 01/07/2019 9:42 AM  [name] [address] [phone] (office) [phone] (fax)

## 2019-01-05 ENCOUNTER — Ambulatory Visit (INDEPENDENT_AMBULATORY_CARE_PROVIDER_SITE_OTHER): Payer: Medicare Other | Admitting: Nurse Practitioner

## 2019-01-05 ENCOUNTER — Encounter (INDEPENDENT_AMBULATORY_CARE_PROVIDER_SITE_OTHER): Payer: Self-pay

## 2019-01-05 ENCOUNTER — Encounter: Payer: Self-pay | Admitting: Nurse Practitioner

## 2019-01-05 VITALS — BP 130/72 | HR 64 | Ht 69.0 in | Wt 160.2 lb

## 2019-01-05 DIAGNOSIS — I495 Sick sinus syndrome: Secondary | ICD-10-CM

## 2019-01-05 DIAGNOSIS — I4821 Permanent atrial fibrillation: Secondary | ICD-10-CM

## 2019-01-05 DIAGNOSIS — I10 Essential (primary) hypertension: Secondary | ICD-10-CM

## 2019-01-05 NOTE — Patient Instructions (Signed)
Medication Instructions:  NONE If you need a refill on your cardiac medications before your next appointment, please call your pharmacy.   Lab work: NONE If you have labs (blood work) drawn today and your tests are completely normal, you will receive your results only by: Marland Kitchen MyChart Message (if you have MyChart) OR . A paper copy in the mail If you have any lab test that is abnormal or we need to change your treatment, we will call you to review the results.  Testing/Procedures: NONE  Follow-Up: At [facility], you and your health needs are our priority.  As part of our continuing mission to provide you with exceptional heart care, we have created designated Provider Care Teams.  These Care Teams include your primary Cardiologist (physician) and Advanced Practice Providers (APPs -  Physician Assistants and Nurse Practitioners) who all work together to provide you with the care you need, when you need it. You will need a follow up appointment in 1 years.  Please call our office 2 months in advance to schedule this appointment.  You may see Dr Caryl Comes or one of the following Advanced Practice Providers on your designated Care Team:   Chanetta Marshall, NP . Tommye Standard, PA-C  Any Other Special Instructions Will Be Listed Below (If Applicable). Remote monitoring is used to monitor your Pacemaker  from home. This monitoring reduces the number of office visits required to check your device to one time per year. It allows [place] to keep an eye on the functioning of your device to ensure it is working properly. You are scheduled for a device check from home on 01/26/19. You may send your transmission at any time that day. If you have a wireless device, the transmission will be sent automatically. After your physician reviews your transmission, you will receive a postcard with your next transmission date.

## 2019-01-07 LAB — CUP PACEART INCLINIC DEVICE CHECK
Date Time Interrogation Session: 20200214094348
Implantable Lead Implant Date: 19960809
Implantable Lead Implant Date: 19960809
Implantable Lead Location: 753859
Implantable Lead Location: 753860
Implantable Lead Model: 4285
Implantable Lead Serial Number: 209144
Implantable Pulse Generator Implant Date: 20130614
Pulse Gen Serial Number: 115653

## 2019-01-12 ENCOUNTER — Other Ambulatory Visit: Payer: Self-pay | Admitting: Internal Medicine

## 2019-01-19 ENCOUNTER — Ambulatory Visit: Payer: Medicare Other | Admitting: Nurse Practitioner

## 2019-01-26 ENCOUNTER — Ambulatory Visit (INDEPENDENT_AMBULATORY_CARE_PROVIDER_SITE_OTHER): Payer: Medicare Other | Admitting: *Deleted

## 2019-01-26 DIAGNOSIS — I495 Sick sinus syndrome: Secondary | ICD-10-CM | POA: Diagnosis not present

## 2019-01-28 LAB — CUP PACEART REMOTE DEVICE CHECK
Battery Remaining Longevity: 60 mo
Battery Remaining Percentage: 70 %
Brady Statistic RV Percent Paced: 73 %
Date Time Interrogation Session: 20200304082400
Implantable Lead Implant Date: 19960809
Implantable Lead Implant Date: 19960809
Implantable Lead Location: 753859
Implantable Lead Location: 753860
Implantable Lead Serial Number: 209144
Implantable Pulse Generator Implant Date: 20130614
Lead Channel Setting Pacing Amplitude: 2.4 V
Lead Channel Setting Pacing Pulse Width: 0.4 ms
Lead Channel Setting Sensing Sensitivity: 2.5 mV
MDC IDC MSMT LEADCHNL RV IMPEDANCE VALUE: 814 Ohm
Pulse Gen Serial Number: 115653

## 2019-02-01 ENCOUNTER — Other Ambulatory Visit: Payer: Self-pay | Admitting: Family Medicine

## 2019-02-01 ENCOUNTER — Other Ambulatory Visit: Payer: Self-pay

## 2019-02-01 MED ORDER — LISINOPRIL 10 MG PO TABS: 10.0000 mg | ORAL_TABLET | Freq: Every day | ORAL | 3 refills | Status: DC

## 2019-02-02 ENCOUNTER — Encounter: Payer: Self-pay | Admitting: Cardiology

## 2019-02-02 NOTE — Progress Notes (Signed)
Remote pacemaker transmission.   

## 2019-03-11 ENCOUNTER — Ambulatory Visit: Payer: Medicare Other | Admitting: Family Medicine

## 2019-04-08 ENCOUNTER — Other Ambulatory Visit: Payer: Self-pay | Admitting: Internal Medicine

## 2019-04-08 NOTE — Telephone Encounter (Signed)
Eliqusi 5mg  refill request received; pt is 85 yrs old, wt-72.7kg, Crea-1.01 on 10/07/2018, last seen by Chanetta Marshall on 01/05/2019, recall pending for next year for appt; will send in refill.

## 2019-04-27 ENCOUNTER — Ambulatory Visit (INDEPENDENT_AMBULATORY_CARE_PROVIDER_SITE_OTHER): Payer: Medicare Other | Admitting: *Deleted

## 2019-04-27 DIAGNOSIS — I495 Sick sinus syndrome: Secondary | ICD-10-CM | POA: Diagnosis not present

## 2019-04-27 LAB — CUP PACEART REMOTE DEVICE CHECK
Battery Remaining Longevity: 60 mo
Battery Remaining Percentage: 70 %
Brady Statistic RV Percent Paced: 73 %
Date Time Interrogation Session: 20200603071100
Implantable Lead Implant Date: 19960809
Implantable Lead Implant Date: 19960809
Implantable Lead Location: 753859
Implantable Lead Location: 753860
Implantable Lead Model: 4285
Implantable Lead Serial Number: 209144
Implantable Pulse Generator Implant Date: 20130614
Lead Channel Impedance Value: 805 Ohm
Lead Channel Setting Pacing Amplitude: 2.4 V
Lead Channel Setting Pacing Pulse Width: 0.4 ms
Lead Channel Setting Sensing Sensitivity: 2.5 mV
Pulse Gen Serial Number: 115653

## 2019-05-06 ENCOUNTER — Encounter: Payer: Self-pay | Admitting: Cardiology

## 2019-05-06 NOTE — Progress Notes (Signed)
Remote pacemaker transmission.   

## 2019-06-02 DIAGNOSIS — L821 Other seborrheic keratosis: Secondary | ICD-10-CM | POA: Diagnosis not present

## 2019-06-02 DIAGNOSIS — D485 Neoplasm of uncertain behavior of skin: Secondary | ICD-10-CM | POA: Diagnosis not present

## 2019-06-02 DIAGNOSIS — L570 Actinic keratosis: Secondary | ICD-10-CM | POA: Diagnosis not present

## 2019-06-02 DIAGNOSIS — D1801 Hemangioma of skin and subcutaneous tissue: Secondary | ICD-10-CM | POA: Diagnosis not present

## 2019-06-02 DIAGNOSIS — C44319 Basal cell carcinoma of skin of other parts of face: Secondary | ICD-10-CM | POA: Diagnosis not present

## 2019-06-02 DIAGNOSIS — D2262 Melanocytic nevi of left upper limb, including shoulder: Secondary | ICD-10-CM | POA: Diagnosis not present

## 2019-06-02 DIAGNOSIS — L820 Inflamed seborrheic keratosis: Secondary | ICD-10-CM | POA: Diagnosis not present

## 2019-06-02 DIAGNOSIS — Z85828 Personal history of other malignant neoplasm of skin: Secondary | ICD-10-CM | POA: Diagnosis not present

## 2019-07-07 ENCOUNTER — Encounter: Payer: Self-pay | Admitting: Family Medicine

## 2019-07-07 ENCOUNTER — Ambulatory Visit (INDEPENDENT_AMBULATORY_CARE_PROVIDER_SITE_OTHER): Payer: Medicare Other | Admitting: Family Medicine

## 2019-07-07 VITALS — BP 92/49 | HR 60 | Ht 69.0 in | Wt 155.0 lb

## 2019-07-07 DIAGNOSIS — G8929 Other chronic pain: Secondary | ICD-10-CM

## 2019-07-07 DIAGNOSIS — M545 Low back pain: Secondary | ICD-10-CM | POA: Diagnosis not present

## 2019-07-07 DIAGNOSIS — I10 Essential (primary) hypertension: Secondary | ICD-10-CM | POA: Diagnosis not present

## 2019-07-07 DIAGNOSIS — I429 Cardiomyopathy, unspecified: Secondary | ICD-10-CM | POA: Diagnosis not present

## 2019-07-07 DIAGNOSIS — D696 Thrombocytopenia, unspecified: Secondary | ICD-10-CM

## 2019-07-07 MED ORDER — LISINOPRIL 5 MG PO TABS: 5.0000 mg | ORAL_TABLET | Freq: Every day | ORAL | 3 refills | Status: DC

## 2019-07-07 NOTE — Progress Notes (Signed)
Phone [phone]   Subjective:  Virtual visit via phonenote Chief Complaint  Patient presents with  . Follow-up  . Hypertension  . Atrial Fibrillation   This visit type was conducted due to national recommendations for restrictions regarding the COVID-19 Pandemic (e.g. social distancing).  This format is felt to be most appropriate for this patient at this time balancing risks to patient and risks to population by having him in for in person visit.  All issues noted in this document were discussed and addressed.  No physical exam was performed (except for noted visual exam or audio findings with Telehealth visits).  The patient has consented to conduct a Telehealth visit and understands insurance will be billed.   Our team/I connected with Darcey Nora at 10:40 AM EDT by phone (patient did not have equipment for webex) and verified that I am speaking with the correct person using two identifiers.  Location patient: [place] Location provider: [facility], office Persons participating in the virtual visit:  patient  Time on phone: 17 minutes Counseling provided about covid 19, back pain  Our team/I discussed the limitations of evaluation and management by telemedicine and the availability of in person appointments. In light of current covid-19 pandemic, patient also understands that we are trying to protect them by minimizing in office contact if at all possible.  The patient expressed consent for telemedicine visit and agreed to proceed. Patient understands insurance will be billed.   ROS- Denies HA, CP, SOB, visual changes. Has back pain as below   Past Medical History-  Patient Active Problem List   Diagnosis Date Noted  . PPM-[employer] 02/07/2009    Priority: High  . ATRIAL FIBRILLATION 10/21/2007    Priority: High  . BPPV (benign paroxysmal positional vertigo) 12/10/2017    Priority: Medium  . Thrombocytopenia ([place]) 08/04/2011    Priority: Medium  .  Obstructive sleep apnea 10/21/2007    Priority: Medium  . Essential hypertension 10/21/2007    Priority: Medium  . BPH associated with nocturia 10/21/2007    Priority: Medium  . Erectile dysfunction 05/28/2016    Priority: Low  . Macular degeneration 05/28/2016    Priority: Low  . Right hip pain 08/04/2013    Priority: Low  . 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 Osteopenia 10/22/2007    Priority: Low  . SUBACUTE BACTERIAL ENDOCARDITIS 10/21/2007    Priority: Low  . GERD 10/21/2007    Priority: Low  . Chronic low back pain 06/09/2018  . Right knee pain 06/02/2017    Medications- reviewed and updated Current Outpatient Medications  Medication Sig Dispense Refill  . cholecalciferol (VITAMIN D) 1000 UNITS tablet Take 1,000 Units by mouth daily.     Marland Kitchen ELIQUIS 5 MG TABS tablet Take 1 tablet by mouth twice daily 60 tablet 5  . verapamil (CALAN-SR) 240 MG CR tablet TAKE 1 TABLET BY MOUTH ONCE DAILY 90 tablet 3  . lisinopril (ZESTRIL) 5 MG tablet Take 1 tablet (5 mg total) by mouth daily. 90 tablet 3   No current facility-administered medications for this visit.      Objective:  BP (!) 92/49   Pulse 60   Ht 5\' 9"  (1.753 m)   Wt 155 lb (70.3 kg)   BMI 22.89 kg/m  self reported vitals  Nonlabored voice, normal speech      Assessment and Plan   #hypertension S: controlled on Lisinopril 10 mg daily and Verapamil 240 mg daily. Checking BP  at home regularly. BP this morning was 92/49 which is a little lower than is normally runs usually 115/60 . Reports mild occasional dizziness, "not substantial" per pt report. Not following low sodium diet, not watching salt intake. Eats eggs and fried bacon every morning.  BP Readings from Last 3 Encounters:  07/07/19 (!) 92/49  01/05/19 130/72  07/07/18 118/64  A/P: Blood pressure has been running on lower side at home and even down into the 90s today-we opted to reduce his lisinopril to 5 mg-if he has any  persistent numbers under 536 for systolic blood pressure or if he has lightheadedness/dizziness he will let us know and we will consider further adjustments. Target more 120-130 SBP  # A Fib/pacemaker for symptomatic bradycardia/secondary cardiomyopathy related to tachycardia S:Taking Eliquis 5 mg daily for anticoagulation  Pacemaker in place.  With pacemaker in place-no tachycardia and no evidence of cardiomyopathy per Dr. Olin Pia.  Notes A/P: Stable. Continue current medications.    # Back pain S:back pain for last 50 years but worse for last few years. If he bends over and lifts seems to tighten up so getting in way of pulling weeds- has to get down on all 4 knees to pull weeds. Not a persistent pain after he gets in better position. On rare occasion when he is walking gets a twinge of pain in his hip.    Had x-ray with urology- and told had osteopenia at that time. X-rays in past with Dr. Noemi Chapel with right hip replacement follow up from 2008 in epic.  A/P: 85 year old male with long-term low back pain that seems to be worsening over the last few years.  He is interested in exercises that may strengthen his core and help support the low back but also wants to avoid anything that may harm him.  He has not had imaging of his low back recently.  We discussed an option of seeing Dr. Tamala Julian of sports medicine-he is interested-referral was placed today.  Recommended follow up:  Hopefully get labs at [place] office when sees Dr. Tamala Julian. Hopefully in person visit in 6 months Future Appointments  Date Time Provider [place]  07/27/2019  8:05 AM [facility] DEVICE REMOTES [facility]  10/26/2019  8:05 AM [facility] DEVICE REMOTES [facility]  01/25/2020  8:05 AM [facility] DEVICE REMOTES [facility]  04/25/2020  8:05 AM [facility] DEVICE REMOTES [facility]  07/25/2020  8:05 AM [facility] DEVICE REMOTES [facility]  10/24/2020  8:05 AM  [facility] DEVICE REMOTES [facility]   Lab/Order associations:   ICD-10-CM   1. Chronic bilateral low back pain without sciatica  M54.5 Ambulatory referral to Sports Medicine   G89.29   2. Essential hypertension  I10 CBC    Comprehensive metabolic panel    Lipid panel  3. Thrombocytopenia ([facility])  D69.6 CBC  4. Secondary cardiomyopathy ([place]) Chronic I42.9     Meds ordered this encounter  Medications  . lisinopril (ZESTRIL) 5 MG tablet    Sig: Take 1 tablet (5 mg total) by mouth daily.    Dispense:  90 tablet    Refill:  3    Return precautions advised.  Garret Reddish, MD

## 2019-07-07 NOTE — Patient Instructions (Addendum)
Health Maintenance Due  Topic Date Due  . INFLUENZA VACCINE  06/25/2019    Depression screen [facility] 2/9 06/09/2018 06/02/2017 05/28/2016  Decreased Interest 0 0 0  Down, Depressed, Hopeless 0 0 0  PHQ - 2 Score 0 0 0

## 2019-07-13 ENCOUNTER — Telehealth: Payer: Self-pay

## 2019-07-13 NOTE — Telephone Encounter (Signed)
Left message for patient to call back to schedule appointment with Dr Smith. ° °

## 2019-07-18 ENCOUNTER — Ambulatory Visit (INDEPENDENT_AMBULATORY_CARE_PROVIDER_SITE_OTHER)
Admission: RE | Admit: 2019-07-18 | Discharge: 2019-07-18 | Disposition: A | Discharge status: Home / Self Care | Payer: Medicare Other | Source: Ambulatory Visit | Attending: Family Medicine | Admitting: Family Medicine

## 2019-07-18 ENCOUNTER — Ambulatory Visit (INDEPENDENT_AMBULATORY_CARE_PROVIDER_SITE_OTHER): Payer: Medicare Other | Admitting: Family Medicine

## 2019-07-18 ENCOUNTER — Other Ambulatory Visit: Payer: Self-pay

## 2019-07-18 ENCOUNTER — Other Ambulatory Visit (INDEPENDENT_AMBULATORY_CARE_PROVIDER_SITE_OTHER): Payer: Medicare Other

## 2019-07-18 ENCOUNTER — Encounter: Payer: Self-pay | Admitting: Family Medicine

## 2019-07-18 VITALS — BP 118/64 | HR 61 | Ht 69.0 in | Wt 155.0 lb

## 2019-07-18 DIAGNOSIS — M47816 Spondylosis without myelopathy or radiculopathy, lumbar region: Secondary | ICD-10-CM | POA: Diagnosis not present

## 2019-07-18 DIAGNOSIS — D696 Thrombocytopenia, unspecified: Secondary | ICD-10-CM

## 2019-07-18 DIAGNOSIS — M25552 Pain in left hip: Secondary | ICD-10-CM

## 2019-07-18 DIAGNOSIS — M5136 Other intervertebral disc degeneration, lumbar region: Secondary | ICD-10-CM

## 2019-07-18 DIAGNOSIS — M545 Low back pain, unspecified: Secondary | ICD-10-CM

## 2019-07-18 DIAGNOSIS — I10 Essential (primary) hypertension: Secondary | ICD-10-CM | POA: Diagnosis not present

## 2019-07-18 DIAGNOSIS — M51369 Other intervertebral disc degeneration, lumbar region without mention of lumbar back pain or lower extremity pain: Secondary | ICD-10-CM

## 2019-07-18 DIAGNOSIS — M1612 Unilateral primary osteoarthritis, left hip: Secondary | ICD-10-CM | POA: Diagnosis not present

## 2019-07-18 LAB — CBC
HCT: 43.8 % (ref 39.0–52.0)
Hemoglobin: 14.5 g/dL (ref 13.0–17.0)
MCHC: 33.1 g/dL (ref 30.0–36.0)
MCV: 91.8 fl (ref 78.0–100.0)
Platelets: 134 10*3/uL — ABNORMAL LOW (ref 150.0–400.0)
RBC: 4.77 Mil/uL (ref 4.22–5.81)
RDW: 13.4 % (ref 11.5–15.5)
WBC: 5.3 10*3/uL (ref 4.0–10.5)

## 2019-07-18 LAB — LIPID PANEL
Cholesterol: 166 mg/dL (ref 0–200)
HDL: 77 mg/dL (ref >39.00)
LDL Cholesterol: 78 mg/dL (ref 0–99)
NonHDL: 88.86
Total CHOL/HDL Ratio: 2
Triglycerides: 56 mg/dL (ref 0.0–149.0)
VLDL: 11.2 mg/dL (ref 0.0–40.0)

## 2019-07-18 LAB — COMPREHENSIVE METABOLIC PANEL
ALT: 12 U/L (ref 0–53)
AST: 17 U/L (ref 0–37)
Albumin: 4.1 g/dL (ref 3.5–5.2)
Alkaline Phosphatase: 67 U/L (ref 39–117)
BUN: 29 mg/dL — ABNORMAL HIGH (ref 6–23)
CO2: 28 mEq/L (ref 19–32)
Calcium: 9.1 mg/dL (ref 8.4–10.5)
Chloride: 102 mEq/L (ref 96–112)
Creatinine, Ser: 1.26 mg/dL (ref 0.40–1.50)
GFR: 54 mL/min — ABNORMAL LOW (ref >60.00)
Glucose, Bld: 78 mg/dL (ref 70–99)
Potassium: 4.3 mEq/L (ref 3.5–5.1)
Sodium: 136 mEq/L (ref 135–145)
Total Bilirubin: 0.6 mg/dL (ref 0.2–1.2)
Total Protein: 7 g/dL (ref 6.0–8.3)

## 2019-07-18 MED ORDER — GABAPENTIN 100 MG PO CAPS: 100.0000 mg | ORAL_CAPSULE | Freq: Every day | ORAL | 0 refills | Status: DC

## 2019-07-18 NOTE — Patient Instructions (Addendum)
Good to see you.  Xray downstairs Ice 20 minutes 2 times daily. Usually after activity and before bed. Exercises 3 times a week.   Gabapentin 100 mg at night  Tart cherry extract 1200mg  at night Vitamin D 2000 IU daily  See me again in 5-6 weeks

## 2019-07-18 NOTE — Progress Notes (Signed)
[facility] [address] [place], [place] [address] Phone: [phone] Subjective:   [name], am serving as a scribe for Dr. Hulan Saas.  I'm seeing this patient by the request  of:  Marin Olp, MD   CC: Back pain  [id number]  Eric Volbrecht Lambert is a 85 y.o. male coming in with complaint of low back pain. Pain occurs every 6 months in his hip as well. Has replacement of right hip. States that his left hip is starting to feel like right hip. Back pain is consistent with flexion. Pain is relieved with rest and sitting down. Pain is on left side more than right. Denies any numbness or tingling in his legs.  Patient denies any weakness noted.    Past Medical History:  Diagnosis Date  . Allergy   . Asthma    childhood  . Atrial fibrillation -permanent   . BPH (benign prostatic hyperplasia)   . CHF (congestive heart failure) ([place])    resolved after pacemaker - tachycardia induced  . Complete heart block ([place])   . ED (erectile dysfunction)   . GERD (gastroesophageal reflux disease)   . GLUCOSE INTOLERANCE 10/22/2007   no recent issues  . OSA (obstructive sleep apnea)   . Pacemaker BSX    dual  . SUBACUTE BACTERIAL ENDOCARDITIS 1970s   Past Surgical History:  Procedure Laterality Date  . INSERT / REPLACE / REMOVE PACEMAKER    . PACEMAKER GENERATOR CHANGE N/A 05/07/2012   Procedure: PACEMAKER GENERATOR CHANGE;  Surgeon: [name], MD;  Location: [facility] CATH LAB;  Service: Cardiovascular;  Laterality: N/A;  . PACEMAKER PLACEMENT    . PARTIAL HIP ARTHROPLASTY     2008   Social History   Socioeconomic History  . Marital status: Married    Spouse name: Not on file  . Number of children: 2  . Years of education: Not on file  . Highest education level: Not on file  Occupational History  . 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 Financial resource strain: Not on file  .  Food insecurity    Worry: Not on file    Inability: Not on file  . Transportation needs    Medical: Not on file    Non-medical: Not on file  Tobacco Use  . Smoking status: Never Smoker  . Smokeless tobacco: Never Used  Substance and Sexual Activity  . Alcohol use: No  . Drug use: No  . Sexual activity: Not on file  Lifestyle  . Physical activity    Days per week: Not on file    Minutes per session: Not on file  . Stress: Not on file  Relationships  . Social Herbalist on phone: Not on file    Gets together: Not on file    Attends religious service: Not on file    Active member of club or organization: Not on file    Attends meetings of clubs or organizations: Not on file    Relationship status: Not on file  Other Topics Concern  . Not on file  Social History Narrative   Married. 2 children. 1 grandkid. 1 greatgrandchild.       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 Prostate cancer Father   . Heart disease Other        mothers side men- strokes and heart attacks  . Diabetes Brother   . Prostate cancer Brother   . Brain cancer Brother      Current Outpatient Medications (Cardiovascular):  .  lisinopril (ZESTRIL) 5 MG tablet, Take 1 tablet (5 mg total) by mouth daily. .  verapamil (CALAN-SR) 240 MG CR tablet, TAKE 1 TABLET BY MOUTH ONCE DAILY    Current Outpatient Medications (Hematological):  Marland Kitchen  ELIQUIS 5 MG TABS tablet, Take 1 tablet by mouth twice daily  Current Outpatient Medications (Other):  .  cholecalciferol (VITAMIN D) 1000 UNITS tablet, Take 1,000 Units by mouth daily.  Marland Kitchen  gabapentin (NEURONTIN) 100 MG capsule, Take 1 capsule (100 mg total) by mouth at bedtime.    Past medical history, social, surgical and family history all reviewed in electronic medical  record.  No pertanent information unless stated regarding to the chief complaint.   Review of Systems:  No headache, visual changes, nausea, vomiting, diarrhea, constipation, dizziness, abdominal pain, skin rash, fevers, chills, night sweats, weight loss, swollen lymph nodes, body aches, joint swelling, muscle aches, chest pain, shortness of breath, mood changes.   Objective  Blood pressure 118/64, pulse 61, height 5\' 9"  (1.753 m), weight 155 lb (70.3 kg), SpO2 98 %.    General: No apparent distress alert and oriented x3 mood and affect normal, dressed appropriately.  HEENT: Pupils equal, extraocular movements intact  Respiratory: Patient's speak in full sentences and does not appear short of breath  Cardiovascular: No lower extremity edema, non tender, no erythema  Skin: Warm dry intact with no signs of infection or rash on extremities or on axial skeleton.  Abdomen: Soft nontender  Neuro: Cranial nerves II through XII are intact, neurovascularly intact in all extremities with 2+ DTRs and 2+ pulses.  Lymph: No lymphadenopathy of posterior or anterior cervical chain or axillae bilaterally.  Gait normal with good balance and coordination.  MSK:  tender with full range of motion and good stability and symmetric strength and tone of shoulders, elbows, wrist, hip, knee and ankles bilaterally.  Mild arthritic changes of multiple joints Back exam inspection shows some loss of lordosis.  Patient does have some tenderness to palpation in the paraspinal musculature left greater than right.  Tightness with straight leg test but no radicular symptoms.  Patient's Corky Sox test minorly tight on the left compared to the right.  Neurovascularly intact distally with 4+ out of 5 strength on the left compared to the right.  [id number]; 15 additional minutes spent for Therapeutic exercises as stated in above notes.  This included exercises focusing on stretching, strengthening, with significant focus on eccentric aspects.    Long term goals include an improvement in range of motion, strength, endurance as well as avoiding reinjury. Patient's frequency would include in 1-2 times a day, 3-5 times a week for a duration of 6-12 weeks.  Low back exercises that included:  Pelvic tilt/bracing instruction to focus on control of the pelvic girdle and lower abdominal muscles  Glute strengthening exercises, focusing on proper firing of the glutes without engaging the low back muscles Proper stretching techniques for maximum relief for the hamstrings, hip flexors, low back and some rotation where tolerated  Proper technique shown and discussed handout in great detail with ATC.  All questions were discussed and answered.     Impression and Recommendations:  This case required medical decision making of moderate complexity. The above documentation has been reviewed and is accurate and complete Lyndal Pulley, DO       Note: This dictation was prepared with Dragon dictation along with smaller phrase technology. Any transcriptional errors that result from this process are unintentional.

## 2019-07-18 NOTE — Assessment & Plan Note (Signed)
HEP, Xray still pending, HEP  Discussed which activities to do and meds OTC  RTC in 4-6 weeks

## 2019-07-25 ENCOUNTER — Telehealth: Payer: Self-pay | Admitting: Family Medicine

## 2019-07-25 ENCOUNTER — Encounter: Payer: Self-pay | Admitting: Family Medicine

## 2019-07-25 NOTE — Telephone Encounter (Signed)
See note  Copied from [place] [phone]. Topic: Quick Communication - Lab Results (Clinic Use ONLY) >> Jul 19, 2019  3:33 PM Jasper Loser, CMA wrote: Called patient to inform them of 07/18/2019 lab results. When patient returns call, triage nurse may disclose results. >> Jul 25, 2019  9:38 AM Ivar Drape wrote: Patient stated he talked to someone at the beginning of last week and asked them to mail out his Lab results with Dr. Ansel Bong notes on them, but he has not received them.  He doesn't know how to use MyChart.

## 2019-07-25 NOTE — Telephone Encounter (Signed)
Results printed to be mailed.

## 2019-07-27 ENCOUNTER — Ambulatory Visit (INDEPENDENT_AMBULATORY_CARE_PROVIDER_SITE_OTHER): Payer: Medicare Other | Admitting: *Deleted

## 2019-07-27 DIAGNOSIS — I495 Sick sinus syndrome: Secondary | ICD-10-CM | POA: Diagnosis not present

## 2019-07-29 LAB — CUP PACEART REMOTE DEVICE CHECK
Battery Remaining Longevity: 54 mo
Battery Remaining Percentage: 65 %
Brady Statistic RV Percent Paced: 72 %
Date Time Interrogation Session: 20200904154400
Implantable Lead Implant Date: 19960809
Implantable Lead Implant Date: 19960809
Implantable Lead Location: 753859
Implantable Lead Location: 753860
Implantable Lead Model: 4285
Implantable Lead Serial Number: 209144
Implantable Pulse Generator Implant Date: 20130614
Lead Channel Impedance Value: 774 Ohm
Lead Channel Setting Pacing Amplitude: 2.4 V
Lead Channel Setting Pacing Pulse Width: 0.4 ms
Lead Channel Setting Sensing Sensitivity: 2.5 mV
Pulse Gen Serial Number: 115653

## 2019-08-03 ENCOUNTER — Telehealth: Payer: Self-pay | Admitting: Family Medicine

## 2019-08-03 NOTE — Telephone Encounter (Signed)
I called the patient to schedule AWV with Loma Sousa, but he declined. VDM (DD)

## 2019-08-11 ENCOUNTER — Encounter: Payer: Self-pay | Admitting: Cardiology

## 2019-08-11 NOTE — Progress Notes (Signed)
Remote pacemaker transmission.   

## 2019-08-22 NOTE — Progress Notes (Signed)
[facility] [address], [place] [address] Phone: [phone] Subjective:      CC: Low back pain follow-up  [id number]  Eric Lambert is a 85 y.o. male coming in with complaint of back pain. States that he feels ok today. No worsening pain.  Patient states that he does not note any significant improvement with the other type of treatments at the moment.  No side effects to any of the other treatments.  Denies any numbness or tingling.    Past Medical History:  Diagnosis Date  . Allergy   . Asthma    childhood  . Atrial fibrillation -permanent   . BPH (benign prostatic hyperplasia)   . CHF (congestive heart failure) ([place])    resolved after pacemaker - tachycardia induced  . Complete heart block ([place])   . ED (erectile dysfunction)   . GERD (gastroesophageal reflux disease)   . GLUCOSE INTOLERANCE 10/22/2007   no recent issues  . OSA (obstructive sleep apnea)   . Pacemaker BSX    dual  . SUBACUTE BACTERIAL ENDOCARDITIS 1970s   Past Surgical History:  Procedure Laterality Date  . INSERT / REPLACE / REMOVE PACEMAKER    . PACEMAKER GENERATOR CHANGE N/A 05/07/2012   Procedure: PACEMAKER GENERATOR CHANGE;  Surgeon: [name], MD;  Location: [facility] CATH LAB;  Service: Cardiovascular;  Laterality: N/A;  . PACEMAKER PLACEMENT    . PARTIAL HIP ARTHROPLASTY     2008   Social History   Socioeconomic History  . Marital status: Married    Spouse name: Not on file  . Number of children: 2  . Years of education: Not on file  . Highest education level: Not on file  Occupational History  . 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 Financial resource strain: Not on file  . Food insecurity    Worry: Not on file    Inability: Not on file  . Transportation needs    Medical: Not on file    Non-medical: Not on file  Tobacco Use  . Smoking status: Never Smoker  . Smokeless tobacco:  Never Used  Substance and Sexual Activity  . Alcohol use: No  . Drug use: No  . Sexual activity: Not on file  Lifestyle  . Physical activity    Days per week: Not on file    Minutes per session: Not on file  . Stress: Not on file  Relationships  . Social Herbalist on phone: Not on file    Gets together: Not on file    Attends religious service: Not on file    Active member of club or organization: Not on file    Attends meetings of clubs or organizations: Not on file    Relationship status: Not on file  Other Topics Concern  . Not on file  Social History Narrative   Married. 2 children. 1 grandkid. 1 greatgrandchild.       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 Prostate cancer Father   . Heart disease Other        mothers side men- strokes and heart attacks  . Diabetes Brother   . Prostate cancer Brother   .  Brain cancer Brother      Current Outpatient Medications (Cardiovascular):  .  lisinopril (ZESTRIL) 5 MG tablet, Take 1 tablet (5 mg total) by mouth daily. .  verapamil (CALAN-SR) 240 MG CR tablet, TAKE 1 TABLET BY MOUTH ONCE DAILY    Current Outpatient Medications (Hematological):  Marland Kitchen  ELIQUIS 5 MG TABS tablet, Take 1 tablet by mouth twice daily  Current Outpatient Medications (Other):  .  cholecalciferol (VITAMIN D) 1000 UNITS tablet, Take 1,000 Units by mouth daily.  Marland Kitchen  gabapentin (NEURONTIN) 100 MG capsule, Take 1 capsule (100 mg total) by mouth at bedtime.    Past medical history, social, surgical and family history all reviewed in electronic medical record.  No pertanent information unless stated regarding to the chief complaint.   Review of Systems:  No headache, visual changes, nausea, vomiting, diarrhea, constipation, dizziness, abdominal pain, skin rash,  fevers, chills, night sweats, weight loss, swollen lymph nodes, body aches, joint swelling, , chest pain, shortness of breath, mood changes.  Positive muscle aches  Objective  Blood pressure (!) 110/50, pulse 63, height 5\' 9"  (1.753 m), weight 162 lb (73.5 kg), SpO2 97 %.    General: No apparent distress alert and oriented x3 mood and affect normal, dressed appropriately.  HEENT: Pupils equal, extraocular movements intact  Respiratory: Patient's speak in full sentences and does not appear short of breath  Cardiovascular: No lower extremity edema, non tender, no erythema  Skin: Warm dry intact with no signs of infection or rash on extremities or on axial skeleton.  Abdomen: Soft nontender  Neuro: Cranial nerves II through XII are intact, neurovascularly intact in all extremities with 2+ DTRs and 2+ pulses.  Lymph: No lymphadenopathy of posterior or anterior cervical chain or axillae bilaterally.  Gait antalgic favoring the left hip MSK:  tender with limited range of motion and stability symmetric strength and tone of shoulders, elbows, wrist, , knee and ankles bilaterally.  Back Exam:  Inspection: Degenerative scoliosis noted with some loss of lordosis Motion: Flexion 45 deg, Extension 45 deg, Side Bending to 35 deg bilaterally,  Rotation to 25 deg bilaterally  SLR laying: Negative  XSLR laying: Negative  Palpable tenderness: Tightness noted in the parascapular region as well as the paraspinal musculature of the lumbar spine. FABER: Tightness bilaterally. Sensory change: Gross sensation intact to all lumbar and sacral dermatomes.  Reflexes: 2+ at both patellar tendons, 2+ at achilles tendons, Babinski's downgoing.  Strength at foot  Plantar-flexion: 5/5 Dorsi-flexion: 5/5 Eversion: 5/5 Inversion: 5/5  Leg strength  Quad: 5/5 Hamstring: 5/5 Hip flexor: 5/5 Hip abductors: 5/5     Impression and Recommendations:     This case required medical decision making of moderate complexity.  The above documentation has been reviewed and is accurate and complete Lyndal Pulley, DO       Note: This dictation was prepared with Dragon dictation along with smaller phrase technology. Any transcriptional errors that result from this process are unintentional.

## 2019-08-23 ENCOUNTER — Other Ambulatory Visit: Payer: Self-pay

## 2019-08-23 ENCOUNTER — Encounter: Payer: Self-pay | Admitting: Family Medicine

## 2019-08-23 ENCOUNTER — Ambulatory Visit (INDEPENDENT_AMBULATORY_CARE_PROVIDER_SITE_OTHER): Payer: Medicare Other | Admitting: Family Medicine

## 2019-08-23 DIAGNOSIS — Z23 Encounter for immunization: Secondary | ICD-10-CM | POA: Diagnosis not present

## 2019-08-23 DIAGNOSIS — M5136 Other intervertebral disc degeneration, lumbar region: Secondary | ICD-10-CM | POA: Diagnosis not present

## 2019-08-23 NOTE — Assessment & Plan Note (Signed)
Patient's x-rays are independently visualized by me shows the patient does have degenerative disc disease and patient symptoms do correlate significantly with a lumbar spinal stenosis.  Patient feels like the gabapentin is not helping and encouraged him to try a week without it. Seems to worsen again he should go back on the medication.  We discussed with him increasing to 1 mg at night is a possibility which patient declined.  We discussed potential advanced imaging the patient states that he would not want any surgical or epidurals.  At this point I do not think that advanced imaging would be warranted if it is not been a change medical management.  Discussed with patient in great length over this.  Patient is in agreement with the plan and will follow-up again in 2 to 3 months.  Spent  25 minutes with patient face-to-face and had greater than 50% of counseling including as described above in assessment and plan.

## 2019-08-23 NOTE — Patient Instructions (Addendum)
Good to see you Ice 20 minutes 2 times daily. Usually after activity and before bed. Just flu today  Gabapentin as needed Keep doing the exercises occasionally  See me again in 3 months to check in

## 2019-10-09 ENCOUNTER — Other Ambulatory Visit: Payer: Self-pay | Admitting: Internal Medicine

## 2019-10-10 NOTE — Telephone Encounter (Signed)
Prescription refill request for Eliquis received.  Last office visit: Eric Lambert (01-05-2019) Scr: 1.26, (07-18-2019) Age: 85 y.o. Weight: 73.5 kg  Prescription refill sent.

## 2019-10-26 ENCOUNTER — Ambulatory Visit (INDEPENDENT_AMBULATORY_CARE_PROVIDER_SITE_OTHER): Payer: Medicare Other | Admitting: *Deleted

## 2019-10-26 DIAGNOSIS — Z950 Presence of cardiac pacemaker: Secondary | ICD-10-CM

## 2019-10-26 LAB — CUP PACEART REMOTE DEVICE CHECK
Battery Remaining Longevity: 54 mo
Battery Remaining Percentage: 65 %
Brady Statistic RV Percent Paced: 72 %
Date Time Interrogation Session: 20201202031100
Implantable Lead Implant Date: 19960809
Implantable Lead Implant Date: 19960809
Implantable Lead Location: 753859
Implantable Lead Location: 753860
Implantable Lead Model: 4285
Implantable Lead Serial Number: 209144
Implantable Pulse Generator Implant Date: 20130614
Lead Channel Impedance Value: 851 Ohm
Lead Channel Setting Pacing Amplitude: 2.4 V
Lead Channel Setting Pacing Pulse Width: 0.4 ms
Lead Channel Setting Sensing Sensitivity: 2.5 mV
Pulse Gen Serial Number: 115653

## 2019-11-21 ENCOUNTER — Ambulatory Visit: Payer: Medicare Other | Admitting: Family Medicine

## 2019-11-21 NOTE — Progress Notes (Signed)
PPM remote 

## 2019-11-30 ENCOUNTER — Telehealth: Payer: Self-pay | Admitting: Internal Medicine

## 2019-11-30 NOTE — Telephone Encounter (Signed)
New Message:      Pt wants to know if he is on a blood thinner, will he need a note from his Cardiologist to get the COVID Vaccine.

## 2019-12-07 NOTE — Telephone Encounter (Signed)
Pt states he will be receiving his Covid vaccine this week in [facility].  Pt advised [facility] does not have a requirement for clearance if on a blood thinner.  Pt verbalized understanding.

## 2020-01-06 ENCOUNTER — Other Ambulatory Visit: Payer: Self-pay | Admitting: Internal Medicine

## 2020-01-13 ENCOUNTER — Other Ambulatory Visit: Payer: Self-pay | Admitting: Internal Medicine

## 2020-01-13 MED ORDER — VERAPAMIL HCL ER 240 MG PO TBCR: 240.0000 mg | EXTENDED_RELEASE_TABLET | Freq: Every day | ORAL | 0 refills | Status: DC

## 2020-01-13 NOTE — Addendum Note (Signed)
Addended by: Willeen Cass A on: 01/13/2020 02:51 PM   Modules accepted: Orders

## 2020-01-23 DIAGNOSIS — L821 Other seborrheic keratosis: Secondary | ICD-10-CM | POA: Diagnosis not present

## 2020-01-23 DIAGNOSIS — L570 Actinic keratosis: Secondary | ICD-10-CM | POA: Diagnosis not present

## 2020-01-23 DIAGNOSIS — Z85828 Personal history of other malignant neoplasm of skin: Secondary | ICD-10-CM | POA: Diagnosis not present

## 2020-01-23 DIAGNOSIS — D1801 Hemangioma of skin and subcutaneous tissue: Secondary | ICD-10-CM | POA: Diagnosis not present

## 2020-01-25 ENCOUNTER — Ambulatory Visit (INDEPENDENT_AMBULATORY_CARE_PROVIDER_SITE_OTHER): Payer: Medicare HMO | Admitting: *Deleted

## 2020-01-25 DIAGNOSIS — Z950 Presence of cardiac pacemaker: Secondary | ICD-10-CM | POA: Diagnosis not present

## 2020-01-26 ENCOUNTER — Telehealth: Payer: Self-pay | Admitting: Internal Medicine

## 2020-01-26 ENCOUNTER — Telehealth: Payer: Self-pay | Admitting: Family Medicine

## 2020-01-26 LAB — CUP PACEART REMOTE DEVICE CHECK
Battery Remaining Longevity: 48 mo
Battery Remaining Percentage: 58 %
Brady Statistic RV Percent Paced: 70 %
Date Time Interrogation Session: 20210303172100
Implantable Lead Implant Date: 19960809
Implantable Lead Location: 753860
Implantable Lead Model: 4285
Implantable Lead Serial Number: 209144
Implantable Pulse Generator Implant Date: 20130614
Lead Channel Impedance Value: 786 Ohm
Lead Channel Setting Pacing Amplitude: 2.4 V
Lead Channel Setting Pacing Pulse Width: 0.4 ms
Lead Channel Setting Sensing Sensitivity: 2.5 mV
Pulse Gen Serial Number: 115653

## 2020-01-26 NOTE — Progress Notes (Signed)
PPM Remote  

## 2020-01-26 NOTE — Telephone Encounter (Signed)
   Pt said he's been having persistent cough with no fever. Pt asking if ok for him to come in to his appt on 01/30/20. Pt said if he didn't able to answer call to e-mail him at [email or web address]  Please advise

## 2020-01-26 NOTE — Telephone Encounter (Signed)
Patient called in this morning and is wanting advice about some new onset issues, he states he has noticed he has had a cough for a few months and it seems like it hasn't gotten any better, but also has noticed that right after eating he seems to get some heartburn for a about an hour - two hours, can we schedule an in person or would this need to be virtual?

## 2020-01-26 NOTE — Telephone Encounter (Signed)
Spoke with pt who states he has had a nonproductive cough for "months".  Pt states he did feel if he had some congestion in his chest upon waking this morning and felt the cough was "wet" today but he is coughing at this time.  Pt denies other symptoms at this time.  Pt states he has not gone to his PCP about cough nor has he tried any OTC medications.  Pt encouraged to reach out to his PCP for further evaluation as there are so many things his cough could be related to.  Pt advised he is OK to come to appointment masked and as long as he has no additional symptoms and is feeling well.  Pt verbalizes understanding and agrees with current plan.

## 2020-01-26 NOTE — Telephone Encounter (Signed)
It can be virtual

## 2020-01-27 ENCOUNTER — Encounter: Payer: Self-pay | Admitting: Family Medicine

## 2020-01-27 ENCOUNTER — Ambulatory Visit (INDEPENDENT_AMBULATORY_CARE_PROVIDER_SITE_OTHER): Payer: Medicare HMO | Admitting: Family Medicine

## 2020-01-27 VITALS — BP 110/60 | HR 65 | Ht 69.0 in | Wt 162.0 lb

## 2020-01-27 DIAGNOSIS — K219 Gastro-esophageal reflux disease without esophagitis: Secondary | ICD-10-CM

## 2020-01-27 DIAGNOSIS — I4821 Permanent atrial fibrillation: Secondary | ICD-10-CM

## 2020-01-27 DIAGNOSIS — D696 Thrombocytopenia, unspecified: Secondary | ICD-10-CM

## 2020-01-27 DIAGNOSIS — H524 Presbyopia: Secondary | ICD-10-CM | POA: Diagnosis not present

## 2020-01-27 DIAGNOSIS — I429 Cardiomyopathy, unspecified: Secondary | ICD-10-CM | POA: Diagnosis not present

## 2020-01-27 DIAGNOSIS — H353132 Nonexudative age-related macular degeneration, bilateral, intermediate dry stage: Secondary | ICD-10-CM | POA: Diagnosis not present

## 2020-01-27 DIAGNOSIS — I10 Essential (primary) hypertension: Secondary | ICD-10-CM

## 2020-01-27 MED ORDER — OMEPRAZOLE 20 MG PO CPDR: 20.0000 mg | DELAYED_RELEASE_CAPSULE | Freq: Every day | ORAL | 1 refills | Status: DC

## 2020-01-27 NOTE — Progress Notes (Signed)
Phone [phone] Virtual visit via Video note   Subjective:  Chief complaint: Chief Complaint  Patient presents with  . Cough  . Heartburn   ROS-ongoing but not worsening back pain, ongoing but no worsening hip pain  This visit type was conducted due to national recommendations for restrictions regarding the COVID-19 Pandemic (e.g. social distancing).  This format is felt to be most appropriate for this patient at this time balancing risks to patient and risks to population by having him in for in person visit.  No physical exam was performed (except for noted visual exam or audio findings with Telehealth visits).    Our team/I connected with Darcey Nora at  2:20 PM EST by a video enabled telemedicine application ([email or web address] or caregility through epic) and verified that I am speaking with the correct person using two identifiers.  Location patient: [facility] Location provider: [facility], office Persons participating in the virtual visit:  patient  Our team/I discussed the limitations of evaluation and management by telemedicine and the availability of in person appointments. In light of current covid-19 pandemic, patient also understands that we are trying to protect them by minimizing in office contact if at all possible.  The patient expressed consent for telemedicine visit and agreed to proceed. Patient understands insurance will be billed.   Past Medical History-  Patient Active Problem List   Diagnosis Date Noted  . PPM-[employer] 02/07/2009    Priority: High  . ATRIAL FIBRILLATION 10/21/2007    Priority: High  . BPPV (benign paroxysmal positional vertigo) 12/10/2017    Priority: Medium  . Thrombocytopenia ([place]) 08/04/2011    Priority: Medium  . Obstructive sleep apnea 10/21/2007    Priority: Medium  . Essential hypertension 10/21/2007    Priority: Medium  . GERD 10/21/2007    Priority: Medium  . BPH associated with nocturia 10/21/2007    Priority:  Medium  . Erectile dysfunction 05/28/2016    Priority: Low  . Macular degeneration 05/28/2016    Priority: Low  . Right hip pain 08/04/2013    Priority: Low  . 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 Osteopenia 10/22/2007    Priority: Low  . SUBACUTE BACTERIAL ENDOCARDITIS 10/21/2007    Priority: Low  . Degenerative disc disease, lumbar 07/18/2019  . Chronic low back pain 06/09/2018  . Right knee pain 06/02/2017    Medications- reviewed and updated Current Outpatient Medications  Medication Sig Dispense Refill  . cholecalciferol (VITAMIN D) 1000 UNITS tablet Take 1,000 Units by mouth daily.     Marland Kitchen ELIQUIS 5 MG TABS tablet Take 1 tablet by mouth twice daily 60 tablet 5  . lisinopril (ZESTRIL) 5 MG tablet Take 1 tablet (5 mg total) by mouth daily. 90 tablet 3  . verapamil (CALAN-SR) 240 MG CR tablet Take 1 tablet (240 mg total) by mouth daily. 90 tablet 0  . omeprazole (PRILOSEC) 20 MG capsule Take 1 capsule (20 mg total) by mouth daily. 30 capsule 1   No current facility-administered medications for this visit.     Objective:  BP 110/60   Pulse 65   Ht 5\' 9"  (1.753 m)   Wt 162 lb (73.5 kg)   BMI 23.92 kg/m  self reported vitals Gen: NAD, resting comfortably Lungs: nonlabored, normal respiratory rate  Skin: appears dry, no obvious rash     Assessment and Plan   # heart burn/cough  S: Heart burn on and off for 5 years- states for last 5  years has had rare issues with food feeling like it gets stuck perhaps once a month and better with thorough chewing- worse with thicker - no trouble with liquids- about halfway down to stomach- he was told Hx esophageal scaring related to acid reflux. Burning sensation after eating spicy food, worsen at night time- starts about an hour after dinner. Current issues going on 2-3 months. He denies trying anything for this or being on medication for reflux in the past  Dry cough during the day. Sometime in the morning may  be productive some what wet cough . Going on for about 3 months. Denies chest pain . No new shortness of breath.  A/P: This appears to be GERD-I also suspect this may be causing his cough.  We will trial omeprazole 20 mg as a starting point since he has not tried over-the-counter strength PPI yet-discussed he could trial off of this at a month of symptoms completely resolved.  If symptoms or not improving or they worsen-asked him to let me know. -I suspect trouble swallowing/dysphagia may improve with PPI but if it does not would refer to GI for their opinion -Last GFR was 54 and BUN to creatinine ratio was over 20:1-I recommended patient remain well-hydrated due to potential added stress of PPI and possible chronic kidney disease-he agrees to try.   #hypertension S: compliant with lisinopril 2.5 mg (takes only half a 5 mg tablet) and verapamil 240mg  BP Readings from Last 3 Encounters:  01/27/20 (!) 140/102--> 110/60  08/23/19 (!) 110/50  07/18/19 118/64  A/P: Good control on repeat today-continue current medication  #Permanent atrial fibrillation S: compliant with eliquis 5 mg twice daily for anticoagulation.  Remains on verapamil 240 mg standard release for rate control A/P:  Stable. Continue current medications.  Appropriately anticoagulated and rate controlled -Patient with history of secondary cardiomyopathy.  Dr. Olin Pia notes mention this was tachycardia induced related atrial fibrillation-with patient now rate control does not appear to be an issue but will continue to monitor.  Patient also has follow-up with Dr. Caryl Comes early next week  Recommended follow up: Recommended he call to schedule next available physical Future Appointments  Date Time Provider [place]  01/30/2020  2:00 PM [name], MD [facility]  04/25/2020  8:05 AM [facility] DEVICE REMOTES [facility]  07/25/2020  8:05 AM [facility] DEVICE REMOTES [facility]   10/24/2020  8:05 AM [facility] DEVICE REMOTES [facility]   Lab/Order associations:   ICD-10-CM   1. Essential hypertension  I10   2. Gastroesophageal reflux disease without esophagitis  K21.9   3. Permanent atrial fibrillation (HCC)  I48.21   4. Thrombocytopenia ([place])  D69.6   5. Secondary cardiomyopathy ([place])  I42.9     Meds ordered this encounter  Medications  . omeprazole (PRILOSEC) 20 MG capsule    Sig: Take 1 capsule (20 mg total) by mouth daily.    Dispense:  30 capsule    Refill:  1   Return precautions advised.  Garret Reddish, MD

## 2020-01-27 NOTE — Assessment & Plan Note (Signed)
Patient has had thrombocytopenia between 101 50 for over 30 years. Lab Results  Component Value Date   WBC 5.3 07/18/2019   HGB 14.5 07/18/2019   HCT 43.8 07/18/2019   MCV 91.8 07/18/2019   PLT 134.0 (L) 07/18/2019  Last CBC was stable in this range-discussed scheduling next available physical to recheck labs.  Given stability no further work-up planned

## 2020-01-27 NOTE — Patient Instructions (Signed)
There are no preventive care reminders to display for this patient.  Depression screen [facility] 2/9 07/07/2019 06/09/2018 06/02/2017  Decreased Interest 0 0 0  Down, Depressed, Hopeless 0 0 0  PHQ - 2 Score 0 0 0  Some recent data might be hidden    Recommended follow up: No follow-ups on file.

## 2020-01-27 NOTE — Assessment & Plan Note (Signed)
S: compliant with lisinopril 2.5 mg (takes only half a 5 mg tablet) and verapamil 240mg  BP Readings from Last 3 Encounters:  01/27/20 (!) 140/102--> 110/60  08/23/19 (!) 110/50  07/18/19 118/64  A/P: Good control on repeat today-continue current medication

## 2020-01-27 NOTE — Assessment & Plan Note (Signed)
#   heart burn/cough  S: Heart burn on and off for 5 years- states for last 5 years has had rare issues with food feeling like it gets stuck perhaps once a month and better with thorough chewing- worse with thicker - no trouble with liquids- about halfway down to stomach- he was told Hx esophageal scaring related to acid reflux. Burning sensation after eating spicy food, worsen at night time- starts about an hour after dinner. Current issues going on 2-3 months. He denies trying anything for this or being on medication for reflux in the past  Dry cough during the day. Sometime in the morning may be productive some what wet cough . Going on for about 3 months. Denies chest pain . No new shortness of breath.  A/P: This appears to be GERD-I also suspect this may be causing his cough.  We will trial omeprazole 20 mg as a starting point since he has not tried over-the-counter strength PPI yet-discussed he could trial off of this at a month of symptoms completely resolved.  If symptoms or not improving or they worsen-asked him to let me know. -I suspect trouble swallowing/dysphagia may improve with PPI but if it does not would refer to GI for their opinion

## 2020-01-27 NOTE — Assessment & Plan Note (Signed)
#  Permanent atrial fibrillation S: compliant with eliquis 5 mg twice daily for anticoagulation.  Remains on verapamil 240 mg standard release for rate control A/P:  Stable. Continue current medications.  Appropriately anticoagulated and rate controlled

## 2020-01-30 ENCOUNTER — Encounter: Payer: Self-pay | Admitting: Internal Medicine

## 2020-01-30 ENCOUNTER — Ambulatory Visit (INDEPENDENT_AMBULATORY_CARE_PROVIDER_SITE_OTHER): Payer: Medicare HMO | Admitting: Internal Medicine

## 2020-01-30 ENCOUNTER — Other Ambulatory Visit: Payer: Self-pay

## 2020-01-30 VITALS — BP 118/70 | HR 75 | Ht 69.0 in | Wt 165.0 lb

## 2020-01-30 DIAGNOSIS — R001 Bradycardia, unspecified: Secondary | ICD-10-CM | POA: Diagnosis not present

## 2020-01-30 DIAGNOSIS — Z950 Presence of cardiac pacemaker: Secondary | ICD-10-CM

## 2020-01-30 DIAGNOSIS — Z79899 Other long term (current) drug therapy: Secondary | ICD-10-CM | POA: Diagnosis not present

## 2020-01-30 DIAGNOSIS — I4891 Unspecified atrial fibrillation: Secondary | ICD-10-CM | POA: Diagnosis not present

## 2020-01-30 NOTE — Patient Instructions (Signed)
Medication Instructions:  Your physician recommends that you continue on your current medications as directed. Please refer to the Current Medication list given to you today.  Labwork: You will have labs drawn today: BMET    Testing/Procedures: None ordered.  Follow-Up: Your physician wants you to follow-up in: 12 months with Dr Klein. You will receive a reminder letter in the mail two months in advance. If you don't receive a letter, please call our office to schedule the follow-up appointment.  Remote monitoring is used to monitor your Pacemaker of ICD from home. This monitoring reduces the number of office visits required to check your device to one time per year. It allows us to keep an eye on the functioning of your device to ensure it is working properly.  Any Other Special Instructions Will Be Listed Below (If Applicable).  If you need a refill on your cardiac medications before your next appointment, please call your pharmacy.   

## 2020-01-30 NOTE — Progress Notes (Signed)
Patient Care Team: Marin Olp, MD as PCP - General (Family Medicine)   HPI  Eric Lambert is a 86 y.o. 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  The patient denies chest pain, shortness of breath, nocturnal dyspnea, orthopnea or peripheral edema.  There have been no palpitations, lightheadedness or syncope.  C/o cough moist and draw , Date Cr Hgb  1/19 0.9 14.4   8/20 1.26 14.5     no bleeding     Last echo was 2008.   Past Medical History:  Diagnosis Date  . Allergy   . Asthma    childhood  . Atrial fibrillation -permanent   . BPH (benign prostatic hyperplasia)   . CHF (congestive heart failure) ([place])    resolved after pacemaker - tachycardia induced  . Complete heart block ([place])   . ED (erectile dysfunction)   . GERD (gastroesophageal reflux disease)   . GLUCOSE INTOLERANCE 10/22/2007   no recent issues  . OSA (obstructive sleep apnea)   . Pacemaker BSX    dual  . SUBACUTE BACTERIAL ENDOCARDITIS 1970s    Past Surgical History:  Procedure Laterality Date  . INSERT / REPLACE / REMOVE PACEMAKER    . PACEMAKER GENERATOR CHANGE N/A 05/07/2012   Procedure: PACEMAKER GENERATOR CHANGE;  Surgeon: [name], MD;  Location: [facility] CATH LAB;  Service: Cardiovascular;  Laterality: N/A;  . PACEMAKER PLACEMENT    . PARTIAL HIP ARTHROPLASTY     2008    Current Outpatient Medications  Medication Sig Dispense Refill  . cholecalciferol (VITAMIN D) 1000 UNITS tablet Take 1,000 Units by mouth daily.     Marland Kitchen ELIQUIS 5 MG TABS tablet Take 1 tablet by mouth twice daily 60 tablet 5  . lisinopril (ZESTRIL) 5 MG tablet Take 2.5 mg by mouth daily.    Marland Kitchen omeprazole (PRILOSEC) 20 MG capsule Take 1 capsule (20 mg total) by mouth daily. 30 capsule 1  . verapamil (CALAN-SR) 240 MG CR tablet Take 1 tablet (240 mg  total) by mouth daily. 90 tablet 0   No current facility-administered medications for this visit.    No Known Allergies  Review of Systems negative except from HPI and PMH  Physical Exam BP 118/70   Pulse 75   Ht 5\' 9"  (1.753 m)   Wt 165 lb (74.8 kg)   SpO2 97%   BMI 24.37 kg/m  Well developed and well nourished in no acute distress HENT normal Neck supple with JVP-flat Clear Device pocket well healed; without hematoma or erythema.  There is no tethering  Irregularly irregular rate and rhythm, no gallop No  murmur Abd-soft with active BS No Clubbing cyanosis  edema Skin-warm and dry A & Oriented  Grossly normal sensory and motor function  ECG atrial fibrillation with intermittent ventricular pacing at 75 Intervals-/10/40  Assessment and  Plan  Atrial fibrillation  Permanent    Pacemaker-[employer]  The patient's device was interrogated.  The information was reviewed. No changes were made in the programming.        Bradycardia  Hypertension   Renal function grade 3  Cough   Blood pressure is well controlled.  We discussed discontinuing the lisinopril because of the cough.  He has chosen to stay on the lisinopril.  Continue anticoagulation.  His renal function had changed prior to reassessment 8/20.  We will recheck it as  he is trending towards a dose change in his Eliquis.  No laboratory evidence of bleeding; no clinical evidence

## 2020-01-31 LAB — BASIC METABOLIC PANEL
BUN/Creatinine Ratio: 24 (ref 10–24)
BUN: 25 mg/dL (ref 8–27)
CO2: 23 mmol/L (ref 20–29)
Calcium: 9.3 mg/dL (ref 8.6–10.2)
Chloride: 102 mmol/L (ref 96–106)
Creatinine, Ser: 1.03 mg/dL (ref 0.76–1.27)
GFR calc Af Amer: 75 mL/min/{1.73_m2} (ref >59)
GFR calc non Af Amer: 65 mL/min/{1.73_m2} (ref >59)
Glucose: 87 mg/dL (ref 65–99)
Potassium: 4.3 mmol/L (ref 3.5–5.2)
Sodium: 138 mmol/L (ref 134–144)

## 2020-02-03 ENCOUNTER — Telehealth: Payer: Self-pay | Admitting: *Deleted

## 2020-02-03 NOTE — Telephone Encounter (Signed)
Results called to pt. Pt verbalized understanding.  

## 2020-02-03 NOTE — Telephone Encounter (Signed)
No answer. Left message to call back.   

## 2020-02-03 NOTE — Telephone Encounter (Signed)
-----   Message from [name], MD sent at 02/02/2020  6:07 PM EST ----- Please Inform Patient that labs are normal   Thanks

## 2020-02-13 ENCOUNTER — Other Ambulatory Visit: Payer: Self-pay | Admitting: Internal Medicine

## 2020-02-13 LAB — CUP PACEART INCLINIC DEVICE CHECK
Date Time Interrogation Session: 20210212141618
Implantable Lead Implant Date: 19960809
Implantable Lead Location: 753860
Implantable Lead Model: 4285
Implantable Lead Serial Number: 209144
Implantable Pulse Generator Implant Date: 20130614
Pulse Gen Serial Number: 115653

## 2020-02-13 MED ORDER — APIXABAN 5 MG PO TABS: 5.0000 mg | ORAL_TABLET | Freq: Two times a day (BID) | ORAL | 11 refills | Status: DC

## 2020-02-13 NOTE — Telephone Encounter (Addendum)
Eliquis 5mg  refill request received, pt is 86 years old, weight-74.8kg, Crea-1.03 on 01/30/2020, Diagnosis-Afib, and last seen by Dr. Caryl Comes on 01/30/2020. Dose is appropriate based on dosing criteria. Will send in refill to requested pharmacy.   Sent refill but received failed transmission. Called pharmacy to address this and she stated they had just filled his Eliquis prescription and the initial request was because he was trying to fill old prescription under [facility] to the [facility]. Did give a verbal order for Eliquis 5mg  60tabs with 11 refills to Pharmacist Evie. She states she will place on hold since refill was just filled.

## 2020-02-13 NOTE — Addendum Note (Signed)
Addended by: Derrel Nip B on: 02/13/2020 10:39 AM   Modules accepted: Orders

## 2020-02-14 DIAGNOSIS — R69 Illness, unspecified: Secondary | ICD-10-CM | POA: Diagnosis not present

## 2020-02-22 DIAGNOSIS — R69 Illness, unspecified: Secondary | ICD-10-CM | POA: Diagnosis not present

## 2020-03-07 ENCOUNTER — Telehealth: Payer: Self-pay | Admitting: Family Medicine

## 2020-03-07 NOTE — Telephone Encounter (Signed)
I am okay with him continuing omeprazole 20 mg.  I am also okay with a refill being sent in for this if he needs it.  Another alternative which is slightly safer for the kidneys long-term would be Pepcid over-the-counter-if he would like to try this he can and that would be ideal but if symptoms recur go back to the omeprazole.

## 2020-03-07 NOTE — Telephone Encounter (Signed)
Pt called asking if he could continue taking omeprazole 20 MG or at a lower dosage- whichever Dr. Yong Channel thinks is appropriate. Pt states he has a filled prescription but has been reluctant to take it. Pt asks for approval from Dr. Yong Channel to continue taking it. Please advise.

## 2020-03-07 NOTE — Telephone Encounter (Signed)
Called and lm for pt tcb regarding below.

## 2020-03-07 NOTE — Telephone Encounter (Signed)
See below

## 2020-03-22 ENCOUNTER — Telehealth: Payer: Self-pay

## 2020-03-22 NOTE — Telephone Encounter (Signed)
   Primary Cardiologist: Virl Axe, MD  Chart reviewed as part of pre-operative protocol coverage. Simple dental extractions are considered low risk procedures per guidelines and generally do not require any specific cardiac clearance. It is also generally accepted that for simple extractions and dental cleanings, there is no need to interrupt blood thinner therapy.   SBE prophylaxis is not required for the patient.  I will route this recommendation to the requesting party via Epic fax function and remove from pre-op pool.  Please call with questions.  Abigail Butts, PA-C 03/22/2020, 2:50 PM

## 2020-03-22 NOTE — Telephone Encounter (Signed)
   [facility] Pre-operative Risk Assessment    Request for surgical clearance:  1. What type of surgery is being performed? Single tooth extraction   2. When is this surgery scheduled? TBD   3. What type of clearance is required (medical clearance vs. Pharmacy clearance to hold med vs. Both)? Pharmacy  4. Are there any medications that need to be held prior to surgery and how long? Eliquis   5. Practice name and name of physician performing surgery? [facility]/H. Melissa Noon   6. What is your office phone number [phone]     7.   What is your office fax number [phone]  8.   Anesthesia type (None, local, MAC, general) ? Dr. Primus Bravo states on clearance he usually uses Versed, Fentanyl, and Propofol    Mady Haagensen 03/22/2020, 2:11 PM  _________________________________________________________________   (provider comments below)

## 2020-03-24 ENCOUNTER — Other Ambulatory Visit: Payer: Self-pay | Admitting: Internal Medicine

## 2020-03-27 ENCOUNTER — Telehealth: Payer: Self-pay

## 2020-03-27 ENCOUNTER — Telehealth: Payer: Self-pay | Admitting: Adult Health

## 2020-03-27 MED ORDER — LISINOPRIL 5 MG PO TABS: 2.5000 mg | ORAL_TABLET | Freq: Every day | ORAL | 3 refills | Status: DC

## 2020-03-27 NOTE — Telephone Encounter (Signed)
Returned call and Vanes is at lunch, she will return call when back from lunch.

## 2020-03-27 NOTE — Telephone Encounter (Signed)
Requested we re-send the pre-operative note to [facility].  Primary Cardiologist: Virl Axe, MD  Chart reviewed as part of pre-operative protocol coverage. Simple dental extractions are considered low risk procedures per guidelines and generally do not require any specific cardiac clearance. It is also generally accepted that for simple extractions and dental cleanings, there is no need to interrupt blood thinner therapy.   SBE prophylaxis is not required for the patient.  I will route this recommendation to the requesting party via Epic fax function and remove from pre-op pool.  Please call with questions.  Abigail Butts, PA-C 03/22/2020, 2:50 PM  Also requested most recent cardiology office note. See below from Dr. Caryl Comes from 01/30/2020.  HPI  Eric Lambert is a 86 y.o. 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  The patient denies chest pain, shortness of breath, nocturnal dyspnea, orthopnea or peripheral edema.  There have been no palpitations, lightheadedness or syncope.  C/o cough moist and draw , Date Cr Hgb  1/19 0.9 14.4   8/20 1.26 14.5                no bleeding     Last echo was 2008.       Past Medical History:  Diagnosis Date  . Allergy   . Asthma    childhood  . Atrial fibrillation -permanent   . BPH (benign prostatic hyperplasia)   . CHF (congestive heart failure) ([place])    resolved after pacemaker - tachycardia induced  . Complete heart block ([place])   . ED (erectile dysfunction)   . GERD (gastroesophageal reflux disease)   . GLUCOSE INTOLERANCE 10/22/2007   no recent issues  . OSA (obstructive sleep apnea)   . Pacemaker BSX    dual  . SUBACUTE BACTERIAL ENDOCARDITIS 1970s         Past Surgical History:  Procedure Laterality Date  .  INSERT / REPLACE / REMOVE PACEMAKER    . PACEMAKER GENERATOR CHANGE N/A 05/07/2012   Procedure: PACEMAKER GENERATOR CHANGE;  Surgeon: [name], MD;  Location: [facility] CATH LAB;  Service: Cardiovascular;  Laterality: N/A;  . PACEMAKER PLACEMENT    . PARTIAL HIP ARTHROPLASTY     2008          Current Outpatient Medications  Medication Sig Dispense Refill  . cholecalciferol (VITAMIN D) 1000 UNITS tablet Take 1,000 Units by mouth daily.     Marland Kitchen ELIQUIS 5 MG TABS tablet Take 1 tablet by mouth twice daily 60 tablet 5  . lisinopril (ZESTRIL) 5 MG tablet Take 2.5 mg by mouth daily.    Marland Kitchen omeprazole (PRILOSEC) 20 MG capsule Take 1 capsule (20 mg total) by mouth daily. 30 capsule 1  . verapamil (CALAN-SR) 240 MG CR tablet Take 1 tablet (240 mg total) by mouth daily. 90 tablet 0   No current facility-administered medications for this visit.    No Known Allergies  Review of Systems negative except from HPI and PMH  Physical Exam BP 118/70   Pulse 75   Ht 5\' 9"  (1.753 m)   Wt 165 lb (74.8 kg)   SpO2 97%   BMI 24.37 kg/m  Well developed and well nourished in no acute distress HENT normal Neck supple with JVP-flat Clear Device pocket well healed; without hematoma or erythema.  There is no tethering  Irregularly irregular rate and rhythm,  no gallop No  murmur Abd-soft with active BS No Clubbing cyanosis  edema Skin-warm and dry A & Oriented  Grossly normal sensory and motor function  ECG atrial fibrillation with intermittent ventricular pacing at 75 Intervals-/10/40  Assessment and  Plan  Atrial fibrillation  Permanent    Pacemaker-[employer]  The patient's device was interrogated.  The information was reviewed. No changes were made in the programming.        Bradycardia  Hypertension   Renal function grade 3  Cough   Blood pressure is well controlled.  We discussed discontinuing the lisinopril because of the cough.  He has chosen to stay  on the lisinopril.  Continue anticoagulation.  His renal function had changed prior to reassessment 8/20.  We will recheck it as he is trending towards a dose change in his Eliquis.  No laboratory evidence of bleeding; no clinical evidence           Electronically signed by [name], MD at 01/30/2020 3:13 PM

## 2020-03-27 NOTE — Telephone Encounter (Signed)
Eric Lambert states that they are missing information for the surgery clearance.    Fax Number [phone] Patient Surgery Date  04/02/20

## 2020-03-27 NOTE — Telephone Encounter (Signed)
States she needs surgery clearance faxed again.    Also needs last OV and lab notes.

## 2020-03-28 DIAGNOSIS — M1612 Unilateral primary osteoarthritis, left hip: Secondary | ICD-10-CM | POA: Diagnosis not present

## 2020-03-28 DIAGNOSIS — M5442 Lumbago with sciatica, left side: Secondary | ICD-10-CM | POA: Diagnosis not present

## 2020-03-28 NOTE — Telephone Encounter (Signed)
Pt called wanting to track down paper work. Pt is asking when we received a request for the surgery clearance. Please advise.

## 2020-03-29 DIAGNOSIS — K008 Other disorders of tooth development: Secondary | ICD-10-CM | POA: Diagnosis not present

## 2020-03-29 DIAGNOSIS — R69 Illness, unspecified: Secondary | ICD-10-CM | POA: Diagnosis not present

## 2020-03-30 ENCOUNTER — Telehealth: Payer: Self-pay | Admitting: Internal Medicine

## 2020-03-30 NOTE — Telephone Encounter (Signed)
Spoke with pt who states he has a history of bacterial endocarditis and had a molar extracted yesterday.  He took his antibiotics prior to procedure but is asking if he needs to take antibiotics post procedure.  Pt states he read an article on [employer] that suggested he would need this.  Pt advised he should reach out to his oral surgeon for direction as well.  Will forward to Dr Caryl Comes for review and recommendation.  Pt advised Dr Caryl Comes is performing surgeries today and will not be available until later in the day.   Pt verbalizes understanding and agrees with current plan.

## 2020-03-30 NOTE — Telephone Encounter (Signed)
Patient states he had a tooth extraction yesterday and had his normal antibiotics before the procedure. He states he has not had any post procedure, but he had bacterial endocarditis in the past and would like to double check if he needs antibiotics post tooth extraction.

## 2020-03-30 NOTE — Telephone Encounter (Signed)
Pt called back and stated he does not need information about fax.

## 2020-03-30 NOTE — Telephone Encounter (Signed)
I think that patient was informed by someone yesterday to have them re fax ppw but not sure do any of you remember this?

## 2020-04-03 NOTE — Telephone Encounter (Signed)
Spoke with pt and advised per Dr Caryl Comes there is no need for post procedure antibiotic treatment.  Pt verbalized understanding and thanked Therapist, sports for call.

## 2020-04-10 ENCOUNTER — Other Ambulatory Visit: Payer: Self-pay

## 2020-04-10 ENCOUNTER — Other Ambulatory Visit: Payer: Self-pay | Admitting: Sports Medicine

## 2020-04-10 DIAGNOSIS — M545 Low back pain, unspecified: Secondary | ICD-10-CM

## 2020-04-19 ENCOUNTER — Other Ambulatory Visit: Payer: Medicare HMO

## 2020-04-25 ENCOUNTER — Ambulatory Visit (INDEPENDENT_AMBULATORY_CARE_PROVIDER_SITE_OTHER): Payer: Medicare HMO | Admitting: *Deleted

## 2020-04-25 DIAGNOSIS — I4891 Unspecified atrial fibrillation: Secondary | ICD-10-CM | POA: Diagnosis not present

## 2020-04-25 LAB — CUP PACEART REMOTE DEVICE CHECK
Battery Remaining Longevity: 48 mo
Battery Remaining Percentage: 54 %
Brady Statistic RV Percent Paced: 59 %
Date Time Interrogation Session: 20210602031100
Implantable Lead Implant Date: 19960809
Implantable Lead Location: 753860
Implantable Lead Model: 4285
Implantable Lead Serial Number: 209144
Implantable Pulse Generator Implant Date: 20130614
Lead Channel Impedance Value: 842 Ohm
Lead Channel Pacing Threshold Amplitude: 0.8 V
Lead Channel Pacing Threshold Pulse Width: 0.4 ms
Lead Channel Setting Pacing Amplitude: 1.4 V
Lead Channel Setting Pacing Pulse Width: 0.4 ms
Lead Channel Setting Sensing Sensitivity: 2.5 mV
Pulse Gen Serial Number: 115653

## 2020-04-30 DIAGNOSIS — M1612 Unilateral primary osteoarthritis, left hip: Secondary | ICD-10-CM | POA: Diagnosis not present

## 2020-04-30 DIAGNOSIS — M5416 Radiculopathy, lumbar region: Secondary | ICD-10-CM | POA: Diagnosis not present

## 2020-04-30 DIAGNOSIS — M545 Low back pain: Secondary | ICD-10-CM | POA: Diagnosis not present

## 2020-05-01 NOTE — Progress Notes (Signed)
Remote pacemaker transmission.   

## 2020-05-02 DIAGNOSIS — M5416 Radiculopathy, lumbar region: Secondary | ICD-10-CM | POA: Diagnosis not present

## 2020-05-02 DIAGNOSIS — M1612 Unilateral primary osteoarthritis, left hip: Secondary | ICD-10-CM | POA: Diagnosis not present

## 2020-05-02 DIAGNOSIS — M545 Low back pain: Secondary | ICD-10-CM | POA: Diagnosis not present

## 2020-05-05 ENCOUNTER — Other Ambulatory Visit: Payer: Self-pay | Admitting: Internal Medicine

## 2020-05-07 DIAGNOSIS — M545 Low back pain: Secondary | ICD-10-CM | POA: Diagnosis not present

## 2020-05-07 DIAGNOSIS — M1612 Unilateral primary osteoarthritis, left hip: Secondary | ICD-10-CM | POA: Diagnosis not present

## 2020-05-07 DIAGNOSIS — M5416 Radiculopathy, lumbar region: Secondary | ICD-10-CM | POA: Diagnosis not present

## 2020-05-09 DIAGNOSIS — M5416 Radiculopathy, lumbar region: Secondary | ICD-10-CM | POA: Diagnosis not present

## 2020-05-09 DIAGNOSIS — M545 Low back pain: Secondary | ICD-10-CM | POA: Diagnosis not present

## 2020-05-09 DIAGNOSIS — M1612 Unilateral primary osteoarthritis, left hip: Secondary | ICD-10-CM | POA: Diagnosis not present

## 2020-05-14 DIAGNOSIS — M1612 Unilateral primary osteoarthritis, left hip: Secondary | ICD-10-CM | POA: Diagnosis not present

## 2020-05-14 DIAGNOSIS — M545 Low back pain: Secondary | ICD-10-CM | POA: Diagnosis not present

## 2020-05-14 DIAGNOSIS — M5416 Radiculopathy, lumbar region: Secondary | ICD-10-CM | POA: Diagnosis not present

## 2020-05-16 DIAGNOSIS — M545 Low back pain: Secondary | ICD-10-CM | POA: Diagnosis not present

## 2020-05-16 DIAGNOSIS — M1612 Unilateral primary osteoarthritis, left hip: Secondary | ICD-10-CM | POA: Diagnosis not present

## 2020-05-16 DIAGNOSIS — M5416 Radiculopathy, lumbar region: Secondary | ICD-10-CM | POA: Diagnosis not present

## 2020-05-25 DIAGNOSIS — M5416 Radiculopathy, lumbar region: Secondary | ICD-10-CM | POA: Diagnosis not present

## 2020-05-25 DIAGNOSIS — M1612 Unilateral primary osteoarthritis, left hip: Secondary | ICD-10-CM | POA: Diagnosis not present

## 2020-05-25 DIAGNOSIS — M545 Low back pain: Secondary | ICD-10-CM | POA: Diagnosis not present

## 2020-05-29 ENCOUNTER — Other Ambulatory Visit: Payer: Self-pay | Admitting: Sports Medicine

## 2020-05-29 DIAGNOSIS — M47816 Spondylosis without myelopathy or radiculopathy, lumbar region: Secondary | ICD-10-CM | POA: Diagnosis not present

## 2020-05-29 DIAGNOSIS — M545 Low back pain, unspecified: Secondary | ICD-10-CM

## 2020-05-29 DIAGNOSIS — M1612 Unilateral primary osteoarthritis, left hip: Secondary | ICD-10-CM | POA: Diagnosis not present

## 2020-05-30 DIAGNOSIS — M545 Low back pain: Secondary | ICD-10-CM | POA: Diagnosis not present

## 2020-05-30 DIAGNOSIS — M1612 Unilateral primary osteoarthritis, left hip: Secondary | ICD-10-CM | POA: Diagnosis not present

## 2020-05-30 DIAGNOSIS — M5416 Radiculopathy, lumbar region: Secondary | ICD-10-CM | POA: Diagnosis not present

## 2020-06-01 DIAGNOSIS — M1612 Unilateral primary osteoarthritis, left hip: Secondary | ICD-10-CM | POA: Diagnosis not present

## 2020-06-01 DIAGNOSIS — M5416 Radiculopathy, lumbar region: Secondary | ICD-10-CM | POA: Diagnosis not present

## 2020-06-01 DIAGNOSIS — M545 Low back pain: Secondary | ICD-10-CM | POA: Diagnosis not present

## 2020-06-06 ENCOUNTER — Ambulatory Visit
Admission: RE | Admit: 2020-06-06 | Discharge: 2020-06-06 | Disposition: A | Discharge status: Home / Self Care | Payer: Medicare HMO | Source: Ambulatory Visit | Attending: Sports Medicine | Admitting: Sports Medicine

## 2020-06-06 DIAGNOSIS — M542 Cervicalgia: Secondary | ICD-10-CM | POA: Diagnosis not present

## 2020-06-06 DIAGNOSIS — M5416 Radiculopathy, lumbar region: Secondary | ICD-10-CM | POA: Diagnosis not present

## 2020-06-06 DIAGNOSIS — M1612 Unilateral primary osteoarthritis, left hip: Secondary | ICD-10-CM | POA: Diagnosis not present

## 2020-06-06 DIAGNOSIS — M545 Low back pain, unspecified: Secondary | ICD-10-CM

## 2020-06-07 DIAGNOSIS — M1612 Unilateral primary osteoarthritis, left hip: Secondary | ICD-10-CM | POA: Diagnosis not present

## 2020-06-07 DIAGNOSIS — M545 Low back pain: Secondary | ICD-10-CM | POA: Diagnosis not present

## 2020-06-20 ENCOUNTER — Other Ambulatory Visit: Payer: Self-pay | Admitting: Sports Medicine

## 2020-06-20 DIAGNOSIS — G8929 Other chronic pain: Secondary | ICD-10-CM

## 2020-06-20 DIAGNOSIS — M5416 Radiculopathy, lumbar region: Secondary | ICD-10-CM | POA: Diagnosis not present

## 2020-06-20 DIAGNOSIS — M1612 Unilateral primary osteoarthritis, left hip: Secondary | ICD-10-CM | POA: Diagnosis not present

## 2020-06-20 DIAGNOSIS — M545 Low back pain: Secondary | ICD-10-CM | POA: Diagnosis not present

## 2020-06-21 ENCOUNTER — Telehealth: Payer: Self-pay | Admitting: *Deleted

## 2020-06-21 MED ORDER — APIXABAN 5 MG PO TABS: 5.0000 mg | ORAL_TABLET | Freq: Two times a day (BID) | ORAL | 5 refills | Status: DC

## 2020-06-21 NOTE — Telephone Encounter (Signed)
° °  Primary Cardiologist: Virl Axe, MD  Chart reviewed as part of pre-operative protocol coverage. Given past medical history and time since last visit, based on ACC/AHA guidelines, Eric Lambert would be at acceptable risk for the planned procedure without further cardiovascular testing.   Patient with diagnosis of afib on Eliquis for anticoagulation.    Procedure: lumbar ESI Date of procedure: TBD  CHADS2-VASc score of 4 (age x2, CHF, HTN)  CrCl 61mL/min Platelet count 134K  Per office protocol, patient can hold Eliquis for 3 days prior to procedure.    I will route this recommendation to the requesting party via Epic fax function and remove from pre-op pool.  Please call with questions.  Jossie Ng. Nils Thor NP-C    06/21/2020, 1:42 PM [facility] [address] Office [phone] Fax [phone]

## 2020-06-21 NOTE — Telephone Encounter (Signed)
Pt called clinic back asking for details on why he needs to hold his Eliquis for 3 days as he read that the half life of the medication is about 12 hours. Had an extensive conversation with pt regarding his cardiac and stroke risk, Eliquis PK data, and bleed risk of spinal procedure. He is agreeable to holding his Eliquis for 3 days as our policy and national guidelines recommend.

## 2020-06-21 NOTE — Telephone Encounter (Signed)
Patient with diagnosis of afib on Eliquis for anticoagulation.    Procedure: lumbar ESI Date of procedure: TBD  CHADS2-VASc score of 4 (age x2, CHF, HTN)  CrCl 27mL/min Platelet count 134K  Per office protocol, patient can hold Eliquis for 3 days prior to procedure.

## 2020-06-21 NOTE — Telephone Encounter (Signed)
° °  [facility] Pre-operative Risk Assessment    HEARTCARE STAFF: - Please ensure there is not already an duplicate clearance open for this procedure. - Under Visit Info/Reason for Call, type in Other and utilize the format Clearance MM/DD/YY or Clearance TBD. Do not use dashes or single digits. - If request is for dental extraction, please clarify the # of teeth to be extracted.  Request for surgical clearance:  1. What type of surgery is being performed? LUMBAR ESI   2. When is this surgery scheduled? TBD   3. What type of clearance is required (medical clearance vs. Pharmacy clearance to hold med vs. Both)? PHARM  4. Are there any medications that need to be held prior to surgery and how long? ELIQUIS x 2 DAYS PRIOR   5. Practice name and name of physician performing surgery? [facility]   6. What is the office phone number? [phone]   7.   What is the office fax number? [phone]  8.   Anesthesia type (None, local, MAC, general) ? LOCAL   Eric Lambert 06/21/2020, 10:48 AM  _________________________________________________________________   (provider comments below)

## 2020-07-25 ENCOUNTER — Ambulatory Visit (INDEPENDENT_AMBULATORY_CARE_PROVIDER_SITE_OTHER): Payer: Medicare HMO | Admitting: *Deleted

## 2020-07-25 DIAGNOSIS — I4891 Unspecified atrial fibrillation: Secondary | ICD-10-CM | POA: Diagnosis not present

## 2020-07-26 LAB — CUP PACEART REMOTE DEVICE CHECK
Battery Remaining Longevity: 42 mo
Battery Remaining Percentage: 48 %
Brady Statistic RV Percent Paced: 58 %
Date Time Interrogation Session: 20210901031300
Implantable Lead Implant Date: 19960809
Implantable Lead Location: 753860
Implantable Lead Model: 4285
Implantable Lead Serial Number: 209144
Implantable Pulse Generator Implant Date: 20130614
Lead Channel Impedance Value: 815 Ohm
Lead Channel Pacing Threshold Amplitude: 0.9 V
Lead Channel Pacing Threshold Pulse Width: 0.4 ms
Lead Channel Setting Pacing Amplitude: 1.3 V
Lead Channel Setting Pacing Pulse Width: 0.4 ms
Lead Channel Setting Sensing Sensitivity: 2.5 mV
Pulse Gen Serial Number: 115653

## 2020-07-27 NOTE — Progress Notes (Signed)
Remote pacemaker transmission.   

## 2020-08-02 ENCOUNTER — Telehealth: Payer: Self-pay

## 2020-08-02 NOTE — Telephone Encounter (Signed)
Returning patient phone call in regards to most recent transmission on 07/25/20. Normal device function. Next remote 10/24/20.  No answer, LMOVM.

## 2020-08-02 NOTE — Telephone Encounter (Signed)
The pt would like for the nurse to tell him the results of his recent home remote check. I told him the nurse will give him a call back.

## 2020-08-03 NOTE — Telephone Encounter (Signed)
Patient called to find out if his episodes of "getting hot', feeling weak and "weird" that last 15-45 minutes and occur every 5 years can be correlated with any episodes seen on pacemaker transmission. Manual transmission sent , device function WNL, 6 beat of NSVT that was 07/25/20 that did not correlate with his latest episode. Patient reports no CP, SOB, dizziness, syncope with events. Advised patient to contact PCP to be evaluated for episodes. ED precautions given for CP, SOB, dizziness, syncope .

## 2020-08-03 NOTE — Telephone Encounter (Signed)
The pt returning the nurse phone call. I let him speak with Cindy, rn.

## 2020-08-13 ENCOUNTER — Encounter: Payer: Self-pay | Admitting: Family Medicine

## 2020-08-13 ENCOUNTER — Ambulatory Visit (INDEPENDENT_AMBULATORY_CARE_PROVIDER_SITE_OTHER): Payer: Medicare HMO | Admitting: Family Medicine

## 2020-08-13 ENCOUNTER — Other Ambulatory Visit: Payer: Self-pay

## 2020-08-13 VITALS — BP 118/66 | HR 91 | Temp 98.1°F | Ht 69.0 in | Wt 156.8 lb

## 2020-08-13 DIAGNOSIS — M545 Low back pain, unspecified: Secondary | ICD-10-CM

## 2020-08-13 DIAGNOSIS — I700 Atherosclerosis of aorta: Secondary | ICD-10-CM

## 2020-08-13 DIAGNOSIS — I10 Essential (primary) hypertension: Secondary | ICD-10-CM

## 2020-08-13 DIAGNOSIS — G8929 Other chronic pain: Secondary | ICD-10-CM | POA: Diagnosis not present

## 2020-08-13 DIAGNOSIS — M5136 Other intervertebral disc degeneration, lumbar region: Secondary | ICD-10-CM | POA: Diagnosis not present

## 2020-08-13 DIAGNOSIS — Z23 Encounter for immunization: Secondary | ICD-10-CM

## 2020-08-13 MED ORDER — LISINOPRIL 5 MG PO TABS: 2.5000 mg | ORAL_TABLET | Freq: Every day | ORAL | 3 refills | Status: DC

## 2020-08-13 NOTE — Progress Notes (Signed)
Phone [phone] In person visit   Subjective:   Eric Lambert is a 86 y.o. year old very pleasant male patient who presents for/with See problem oriented charting Chief Complaint  Patient presents with  . Hypertension   This visit occurred during the SARS-CoV-2 public health emergency.  Safety protocols were in place, including screening questions prior to the visit, additional usage of staff PPE, and extensive cleaning of exam room while observing appropriate contact time as indicated for disinfecting solutions.   Past Medical History-  Patient Active Problem List   Diagnosis Date Noted  . PPM-[employer] 02/07/2009    Priority: High  . ATRIAL FIBRILLATION 10/21/2007    Priority: High  . BPPV (benign paroxysmal positional vertigo) 12/10/2017    Priority: Medium  . Bradycardia 02/02/2013    Priority: Medium  . Thrombocytopenia ([place]) 08/04/2011    Priority: Medium  . Obstructive sleep apnea 10/21/2007    Priority: Medium  . Essential hypertension 10/21/2007    Priority: Medium  . GERD 10/21/2007    Priority: Medium  . BPH associated with nocturia 10/21/2007    Priority: Medium  . Erectile dysfunction 05/28/2016    Priority: Low  . Macular degeneration 05/28/2016    Priority: Low  . Right hip pain 08/04/2013    Priority: Low  . 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 Osteopenia 10/22/2007    Priority: Low  . SUBACUTE BACTERIAL ENDOCARDITIS 10/21/2007    Priority: Low  . Aortic atherosclerosis ([place]) 08/13/2020  . Degenerative disc disease, lumbar 07/18/2019  . Chronic low back pain 06/09/2018  . Right knee pain 06/02/2017    Medications- reviewed and updated Current Outpatient Medications  Medication Sig Dispense Refill  . apixaban (ELIQUIS) 5 MG TABS tablet Take 1 tablet (5 mg total) by mouth 2 (two) times daily. 60 tablet 5  . cholecalciferol (VITAMIN D) 1000 UNITS tablet Take 1,000 Units by mouth daily.     Marland Kitchen lisinopril  (ZESTRIL) 5 MG tablet Take 0.5 tablets (2.5 mg total) by mouth daily. 45 tablet 3  . omeprazole (PRILOSEC) 20 MG capsule Take 1 capsule (20 mg total) by mouth daily. (Patient taking differently: Take 20 mg by mouth as needed. ) 30 capsule 1  . verapamil (CALAN-SR) 240 MG CR tablet Take 1 tablet by mouth once daily 90 tablet 2   No current facility-administered medications for this visit.     Objective:  BP 118/66   Pulse 91   Temp 98.1 F (36.7 C) (Temporal)   Ht 5\' 9"  (1.753 m)   Wt 156 lb 12.8 oz (71.1 kg)   SpO2 97%   BMI 23.16 kg/m  Gen: NAD, resting comfortably CV: RRR no murmurs rubs or gallops Lungs: CTAB no crackles, wheeze, rhonchi Ext: no edema Skin: warm, dry    Assessment and Plan   #hypertension S: medication: Lisinopril 2.5 mg and verapamil 240 mg daily Home readings #s: Patient mentioned that over the last month or so his blood pressure has been running higher than usual. Has seen some occasional #s into high 130s or low 140 such as 140. Most of time is under 140 though.  BP Readings from Last 3 Encounters:  08/13/20 118/66  01/30/20 118/70  01/27/20 110/60  A/P: discussed goal at least <140/90 hed prefer lower- wants to try 5 mg every other day alternating with 2.5 mg and agrees to stop and go back to 2.5 mg if any lightheadedness   #Joint pain- knee and  back pain- chronic S: Over the last couple of months he has been having some pain in his knees and back.  He uses a walker around the house and a cane outside. He is still able to mow with a push mower. He can walk unaided. If he walks 50 yards has some low level pain in back- can worsen if walks more than this. Prefers to work on his hands and knees in garden instead of bending over   Prior saw Dr. Tamala Julian 08/23/2019 and was given some exercises. More recently, He was seen Dr. Alfonso Ramus with [facility] for lumbar spondylosis and degenerative disc disease most recently. He had a Ct scan reviewed in epic  from 06/06/20 which showedseveral areas of bulging disc.  A/P: I think its reasonable to proceed with steroid injections- he wanted to make sure overall. chadsvasc score of 4 so 1 year stroke risk still under 5%- much lower for 4-5 days off eliquis. I would sign off on short term being off eliquis if needed  #Lower Extremity Weakness S: Patient has noticed recently that when he gets up out of a chair he notices some weakness. He stated that it started a month ago. He has already tried physical therapy but was "kicked out" but still doing some exercise A/P: wonder if this could be related to back issues- encouraged him to consider injection and see if this improves symptoms  #hyperlipidemia S: Medication:none  Lab Results  Component Value Date   CHOL 166 07/18/2019   HDL 77.00 07/18/2019   LDLCALC 78 07/18/2019   TRIG 56.0 07/18/2019   CHOLHDL 2 07/18/2019   A/P: discussed with aortic atherosclerosis noted on ct would prefer LDL under 70. Update lipid panel and reconsider statin- he leans towards staying off statin  Recommended follow up: 6 months physical Future Appointments  Date Time Provider [place]  10/24/2020  8:05 AM [facility] DEVICE REMOTES [facility]  01/23/2021  8:05 AM [facility] DEVICE REMOTES [facility]  04/24/2021  8:05 AM [facility] DEVICE REMOTES [facility]  07/24/2021  8:05 AM [facility] DEVICE REMOTES [facility]  10/23/2021  8:05 AM [facility] DEVICE REMOTES [facility]  01/22/2022  8:05 AM [facility] DEVICE REMOTES [facility]  04/23/2022  8:05 AM [facility] DEVICE REMOTES [facility]   Lab/Order associations:   ICD-10-CM   1. Essential hypertension  I10   2. Degenerative disc disease, lumbar  M51.36   3. Chronic bilateral low back pain without sciatica  M54.5    G89.29   4. Aortic atherosclerosis ([place])  I70.0    Return precautions advised.  Garret Reddish,  MD

## 2020-08-13 NOTE — Patient Instructions (Addendum)
Health Maintenance Due  Topic Date Due  . INFLUENZA VACCINE In office flu shot high dose 06/24/2020   im ok with trying 5 mg lisinopril alternating with 2.5mg  but want you to stop this if any hints of lightheadedness/dizziness/abnormal fatigue  Consider covid 19 booster at least a month from flu shot  Please stop by lab before you go If you have mychart- we will send your results within 3 business days of [place] receiving them.  If you do not have mychart- we will call you about results within 5 business days of [place] receiving them.  *please note we are currently using [facility] which has a longer processing time than [facility] typically so labs may not come back as quickly as in the past *please also note that you will see labs on mychart as soon as they post. I will later go in and write notes on them- will say "notes from Dr. Yong Channel"    Recommended follow up: Return in about 6 months (around 02/10/2021) for physical or sooner if needed.

## 2020-08-13 NOTE — Addendum Note (Signed)
Addended by: Thomes Cake on: 08/13/2020 04:20 PM   Modules accepted: Orders

## 2020-08-13 NOTE — Assessment & Plan Note (Signed)
S: Medication:none  Lab Results  Component Value Date   CHOL 166 07/18/2019   HDL 77.00 07/18/2019   LDLCALC 78 07/18/2019   TRIG 56.0 07/18/2019   CHOLHDL 2 07/18/2019   A/P: discussed with aortic atherosclerosis noted on ct would prefer LDL under 70. Update lipid panel and reconsider statin- he leans towards staying off statin

## 2020-08-14 LAB — LIPID PANEL (REFL)
Cholesterol: 160 mg/dL (ref <200)
HDL: 77 mg/dL (ref >=40)
LDL Cholesterol (Calc): 70 mg/dL (calc)
Non-HDL Cholesterol (Calc): 83 mg/dL (calc) (ref <130)
Total CHOL/HDL Ratio: 2.1 (calc) (ref <5.0)
Triglycerides: 53 mg/dL (ref <150)

## 2020-08-14 LAB — COMPLETE METABOLIC PANEL WITH GFR
AG Ratio: 1.5 (calc) (ref 1.0–2.5)
ALT: 8 U/L — ABNORMAL LOW (ref 9–46)
AST: 12 U/L (ref 10–35)
Albumin: 4 g/dL (ref 3.6–5.1)
Alkaline phosphatase (APISO): 69 U/L (ref 35–144)
BUN/Creatinine Ratio: 33 (calc) — ABNORMAL HIGH (ref 6–22)
BUN: 33 mg/dL — ABNORMAL HIGH (ref 7–25)
CO2: 26 mmol/L (ref 20–32)
Calcium: 9.5 mg/dL (ref 8.6–10.3)
Chloride: 103 mmol/L (ref 98–110)
Creat: 1 mg/dL (ref 0.70–1.11)
GFR, Est African American: 77 mL/min/{1.73_m2} (ref >=60)
GFR, Est Non African American: 66 mL/min/{1.73_m2} (ref >=60)
Globulin: 2.6 g/dL (calc) (ref 1.9–3.7)
Glucose, Bld: 77 mg/dL (ref 65–99)
Potassium: 4.3 mmol/L (ref 3.5–5.3)
Sodium: 138 mmol/L (ref 135–146)
Total Bilirubin: 0.5 mg/dL (ref 0.2–1.2)
Total Protein: 6.6 g/dL (ref 6.1–8.1)

## 2020-08-14 LAB — CBC WITH DIFFERENTIAL/PLATELET
Absolute Monocytes: 549 cells/uL (ref 200–950)
Basophils Absolute: 28 cells/uL (ref 0–200)
Basophils Relative: 0.5 %
Eosinophils Absolute: 90 cells/uL (ref 15–500)
Eosinophils Relative: 1.6 %
HCT: 42.5 % (ref 38.5–50.0)
Hemoglobin: 14.2 g/dL (ref 13.2–17.1)
Lymphs Abs: 1434 cells/uL (ref 850–3900)
MCH: 30.9 pg (ref 27.0–33.0)
MCHC: 33.4 g/dL (ref 32.0–36.0)
MCV: 92.6 fL (ref 80.0–100.0)
MPV: 11 fL (ref 7.5–12.5)
Monocytes Relative: 9.8 %
Neutro Abs: 3500 cells/uL (ref 1500–7800)
Neutrophils Relative %: 62.5 %
Platelets: 175 10*3/uL (ref 140–400)
RBC: 4.59 10*6/uL (ref 4.20–5.80)
RDW: 12.6 % (ref 11.0–15.0)
Total Lymphocyte: 25.6 %
WBC: 5.6 10*3/uL (ref 3.8–10.8)

## 2020-08-20 DIAGNOSIS — R69 Illness, unspecified: Secondary | ICD-10-CM | POA: Diagnosis not present

## 2020-08-21 ENCOUNTER — Other Ambulatory Visit: Payer: Self-pay

## 2020-08-21 ENCOUNTER — Ambulatory Visit
Admission: RE | Admit: 2020-08-21 | Discharge: 2020-08-21 | Disposition: A | Discharge status: Home / Self Care | Payer: Medicare HMO | Source: Ambulatory Visit | Attending: Sports Medicine | Admitting: Sports Medicine

## 2020-08-21 DIAGNOSIS — M47816 Spondylosis without myelopathy or radiculopathy, lumbar region: Secondary | ICD-10-CM | POA: Diagnosis not present

## 2020-08-21 DIAGNOSIS — G8929 Other chronic pain: Secondary | ICD-10-CM

## 2020-08-21 MED ORDER — METHYLPREDNISOLONE ACETATE 40 MG/ML INJ SUSP (RADIOLOG
120.0000 mg | Freq: Once | INTRAMUSCULAR | Status: AC
  Administered 2020-08-21: 120 mg via EPIDURAL

## 2020-08-21 MED ORDER — IOPAMIDOL (ISOVUE-M 200) INJECTION 41%
1.0000 mL | Freq: Once | INTRAMUSCULAR | Status: AC
  Administered 2020-08-21: 1 mL via EPIDURAL

## 2020-08-21 NOTE — Discharge Instructions (Signed)

## 2020-09-25 DIAGNOSIS — M545 Low back pain, unspecified: Secondary | ICD-10-CM | POA: Diagnosis not present

## 2020-10-02 ENCOUNTER — Telehealth: Payer: Self-pay

## 2020-10-02 NOTE — Telephone Encounter (Signed)
Patient would like to come in to speak with Dr. Yong Channel in regard to a hernia he believes he has.  Patient would like to be referred to a surgeon.  States he would not be able to come in until the week of Thanksgiving.    Patient states he believes he has a low groin left side hernia.  States he is not having any pain or other symptoms.  Can patient be worked into a same day slot.

## 2020-10-03 NOTE — Telephone Encounter (Signed)
Patient has been scheduled for 11/23

## 2020-10-04 DIAGNOSIS — R69 Illness, unspecified: Secondary | ICD-10-CM | POA: Diagnosis not present

## 2020-10-15 NOTE — Progress Notes (Signed)
Phone [phone] In person visit   Subjective:   Eric Lambert is a 86 y.o. year old very pleasant male patient who presents for/with See problem oriented charting Chief Complaint  Patient presents with  . Discuss Hernia   This visit occurred during the SARS-CoV-2 public health emergency.  Safety protocols were in place, including screening questions prior to the visit, additional usage of staff PPE, and extensive cleaning of exam room while observing appropriate contact time as indicated for disinfecting solutions.   Past Medical History-  Patient Active Problem List   Diagnosis Date Noted  . PPM-[employer] 02/07/2009    Priority: High  . ATRIAL FIBRILLATION 10/21/2007    Priority: High  . BPPV (benign paroxysmal positional vertigo) 12/10/2017    Priority: Medium  . Bradycardia 02/02/2013    Priority: Medium  . Thrombocytopenia ([place]) 08/04/2011    Priority: Medium  . Obstructive sleep apnea 10/21/2007    Priority: Medium  . Essential hypertension 10/21/2007    Priority: Medium  . GERD 10/21/2007    Priority: Medium  . BPH associated with nocturia 10/21/2007    Priority: Medium  . Erectile dysfunction 05/28/2016    Priority: Low  . Macular degeneration 05/28/2016    Priority: Low  . Right hip pain 08/04/2013    Priority: Low  . 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 Osteopenia 10/22/2007    Priority: Low  . SUBACUTE BACTERIAL ENDOCARDITIS 10/21/2007    Priority: Low  . Aortic atherosclerosis ([place]) 08/13/2020  . Degenerative disc disease, lumbar 07/18/2019  . Chronic low back pain 06/09/2018  . Right knee pain 06/02/2017    Medications- reviewed and updated Current Outpatient Medications  Medication Sig Dispense Refill  . apixaban (ELIQUIS) 5 MG TABS tablet Take 1 tablet (5 mg total) by mouth 2 (two) times daily. 60 tablet 5  . cholecalciferol (VITAMIN D) 1000 UNITS tablet Take 1,000 Units by mouth daily.     Marland Kitchen  lisinopril (ZESTRIL) 5 MG tablet Take 0.5-1 tablets (2.5-5 mg total) by mouth daily. 90 tablet 3  . omeprazole (PRILOSEC) 20 MG capsule Take 1 capsule (20 mg total) by mouth daily. (Patient taking differently: Take 20 mg by mouth as needed. ) 30 capsule 1  . verapamil (CALAN-SR) 240 MG CR tablet Take 1 tablet by mouth once daily 90 tablet 2   No current facility-administered medications for this visit.     Objective:  BP 128/70   Pulse 71   Temp 98.3 F (36.8 C) (Temporal)   Resp 18   Ht 5\' 9"  (1.753 m)   Wt 156 lb 9.6 oz (71 kg)   SpO2 98%   BMI 23.13 kg/m  Gen: NAD, resting comfortably CV: RRR no murmurs rubs or gallops Lungs: CTAB no crackles, wheeze, rhonchi Abdomen: soft/nontender/nondistended/normal bowel sounds. No rebound or guarding.  Ext: no edema Skin: warm, dry Groin: bilateral inguinal hernia worse on left than the right.     Assessment and Plan   # Hernia S: patient states hernia present in left groin for years. Also concerned may have smaller hernia on the right  Over the last year to 6 months area seems puffier. Gets intermittent pain with this- not sure pain has increased a great deal though- seems pretty stable. Has an appointment with [place] surgery on 9th of December.   Patient mentioned that over the last couple of months his hernia has gotten worse. Some pain at times. He stated its hard to  tell which pain is associated with the hernia.  A/P: Appears to have bilateral inguinal hernias-already has upcoming [place] surgery appointment-I think that is the right next move to see if they would recommend surgery for this.  He is medically maximized for surgery but would need cardiac clearance.  #hypertension S: medication: lisinopril 2.5 and 5 mg on alternate days as well as verapamil though mainly for a fib BP Readings from Last 3 Encounters:  10/16/20 128/70  08/21/20 125/67  08/13/20 118/66  A/P: Stable. Continue current medications.    # DDD - 05/29/2020 followed by Dr. Alfonso Ramus at [facility] for Lumbar spondylosis and DJD. will follow up after CT lumbar - essentially arthritic back but there is not much that can be done. in passing facet joint injections discussed. We discussed following up for consideration of these injections as ongoing back pain and this is limiting some activities he enjoys. Did feel like oral steroid made him feel better short term  Recommended follow up: keep march visit unless needs [place] sooner Future Appointments  Date Time Provider [place]  10/24/2020  8:05 AM [facility] DEVICE REMOTES [facility]  01/23/2021  8:05 AM [facility] DEVICE REMOTES [facility]  02/19/2021  9:40 AM Marin Olp, MD [facility]  04/24/2021  8:05 AM [facility] DEVICE REMOTES [facility]  07/24/2021  8:05 AM [facility] DEVICE REMOTES [facility]  10/23/2021  8:05 AM [facility] DEVICE REMOTES [facility]  01/22/2022  8:05 AM [facility] DEVICE REMOTES [facility]  04/23/2022  8:05 AM [facility] DEVICE REMOTES [facility]    Lab/Order associations:   ICD-10-CM   1. Bilateral inguinal hernia without obstruction or gangrene, recurrence not specified  K40.20   2. Essential hypertension  I10    Return precautions advised.  Garret Reddish, MD

## 2020-10-15 NOTE — Patient Instructions (Addendum)
I agree with getting surgery's opinion on surgical correction of hernia  You are medically maximized but would need cardiology clearance as well.

## 2020-10-16 ENCOUNTER — Ambulatory Visit (INDEPENDENT_AMBULATORY_CARE_PROVIDER_SITE_OTHER): Payer: Medicare HMO | Admitting: Family Medicine

## 2020-10-16 ENCOUNTER — Encounter: Payer: Self-pay | Admitting: Family Medicine

## 2020-10-16 ENCOUNTER — Other Ambulatory Visit: Payer: Self-pay

## 2020-10-16 VITALS — BP 128/70 | HR 71 | Temp 98.3°F | Resp 18 | Ht 69.0 in | Wt 156.6 lb

## 2020-10-16 DIAGNOSIS — K4020 Bilateral inguinal hernia, without obstruction or gangrene, not specified as recurrent: Secondary | ICD-10-CM | POA: Diagnosis not present

## 2020-10-16 DIAGNOSIS — I10 Essential (primary) hypertension: Secondary | ICD-10-CM

## 2020-10-22 ENCOUNTER — Other Ambulatory Visit: Payer: Self-pay | Admitting: Sports Medicine

## 2020-10-22 DIAGNOSIS — M545 Low back pain, unspecified: Secondary | ICD-10-CM

## 2020-10-24 ENCOUNTER — Ambulatory Visit (INDEPENDENT_AMBULATORY_CARE_PROVIDER_SITE_OTHER): Payer: Medicare HMO

## 2020-10-24 DIAGNOSIS — I4891 Unspecified atrial fibrillation: Secondary | ICD-10-CM

## 2020-10-24 LAB — CUP PACEART REMOTE DEVICE CHECK
Battery Remaining Longevity: 42 mo
Battery Remaining Percentage: 48 %
Brady Statistic RV Percent Paced: 55 %
Date Time Interrogation Session: 20211201032300
Implantable Lead Implant Date: 19960809
Implantable Lead Location: 753860
Implantable Lead Model: 4285
Implantable Lead Serial Number: 209144
Implantable Pulse Generator Implant Date: 20130614
Lead Channel Impedance Value: 847 Ohm
Lead Channel Pacing Threshold Amplitude: 0.8 V
Lead Channel Pacing Threshold Pulse Width: 0.4 ms
Lead Channel Setting Pacing Amplitude: 1.3 V
Lead Channel Setting Pacing Pulse Width: 0.4 ms
Lead Channel Setting Sensing Sensitivity: 2.5 mV
Pulse Gen Serial Number: 115653

## 2020-10-29 ENCOUNTER — Other Ambulatory Visit: Payer: Self-pay | Admitting: Internal Medicine

## 2020-10-29 ENCOUNTER — Telehealth: Payer: Self-pay | Admitting: *Deleted

## 2020-10-29 NOTE — Telephone Encounter (Signed)
Pharmacy, can you please comment on how long Eliquis can be held for upcoming spinal injection?  Thank you! 

## 2020-10-29 NOTE — Telephone Encounter (Signed)
   [facility] Pre-operative Risk Assessment    HEARTCARE STAFF: - Please ensure there is not already an duplicate clearance open for this procedure. - Under Visit Info/Reason for Call, type in Other and utilize the format Clearance MM/DD/YY or Clearance TBD. Do not use dashes or single digits. - If request is for dental extraction, please clarify the # of teeth to be extracted.  Request for surgical clearance:  1. What type of surgery is being performed? LUMBAR EPI   2. When is this surgery scheduled? TBD   3. What type of clearance is required (medical clearance vs. Pharmacy clearance to hold med vs. Both)? PHARM  4. Are there any medications that need to be held prior to surgery and how long? ELIQUIS x 3 DAYS PRIOR TO PROCEDURE   5. Practice name and name of physician performing surgery? [facility]; MD NOT LISTED   6. What is the office phone number? [phone]   7.   What is the office fax number? [phone]  8.   Anesthesia type (None, local, MAC, general) ? LOCAL   Julaine Hua 10/29/2020, 12:01 PM  _________________________________________________________________   (provider comments below)

## 2020-10-29 NOTE — Telephone Encounter (Signed)
   Primary Cardiologist: Virl Axe, MD  Chart reviewed as part of pre-operative protocol coverage. Patient has upcoming lumbar EPI planned and we were asked to give our recommendations for holding Eliquis.   Per Pharmacy and office protocol, patient can hold Eliquis for 3 days prior to procedure. This should be restarted as soon as able following injection.  I will route this recommendation to the requesting party via Epic fax function and remove from pre-op pool.  Please call with questions.  Darreld Mclean, PA-C 10/29/2020, 12:39 PM

## 2020-10-29 NOTE — Telephone Encounter (Signed)
Patient with diagnosis of afib on Eliquis for anticoagulation.    Procedure: lumbar ESI Date of procedure: TBD  CHA2DS2-VASc Score = 5  This indicates a 7.2% annual risk of stroke. The patient's score is based upon: CHF History: 1 HTN History: 1 Diabetes History: 0 Stroke History: 0 Vascular Disease History: 1 Age Score: 2 Gender Score: 0   CrCl 41mL/min Platelet count 175K  Per office protocol, patient can hold Eliquis for 3 days prior to procedure.

## 2020-11-01 DIAGNOSIS — K4020 Bilateral inguinal hernia, without obstruction or gangrene, not specified as recurrent: Secondary | ICD-10-CM | POA: Diagnosis not present

## 2020-11-01 DIAGNOSIS — I4891 Unspecified atrial fibrillation: Secondary | ICD-10-CM | POA: Diagnosis not present

## 2020-11-01 NOTE — Progress Notes (Signed)
Remote pacemaker transmission.   

## 2020-11-15 DIAGNOSIS — M1612 Unilateral primary osteoarthritis, left hip: Secondary | ICD-10-CM | POA: Diagnosis not present

## 2020-11-15 DIAGNOSIS — M545 Low back pain, unspecified: Secondary | ICD-10-CM | POA: Diagnosis not present

## 2020-11-15 DIAGNOSIS — M25552 Pain in left hip: Secondary | ICD-10-CM | POA: Diagnosis not present

## 2020-12-12 DIAGNOSIS — M1612 Unilateral primary osteoarthritis, left hip: Secondary | ICD-10-CM | POA: Diagnosis not present

## 2020-12-20 DIAGNOSIS — M25552 Pain in left hip: Secondary | ICD-10-CM | POA: Diagnosis not present

## 2020-12-20 DIAGNOSIS — M1612 Unilateral primary osteoarthritis, left hip: Secondary | ICD-10-CM | POA: Diagnosis not present

## 2021-01-23 ENCOUNTER — Ambulatory Visit (INDEPENDENT_AMBULATORY_CARE_PROVIDER_SITE_OTHER): Payer: Medicare HMO

## 2021-01-23 DIAGNOSIS — I495 Sick sinus syndrome: Secondary | ICD-10-CM

## 2021-01-24 DIAGNOSIS — L570 Actinic keratosis: Secondary | ICD-10-CM | POA: Diagnosis not present

## 2021-01-24 DIAGNOSIS — D0439 Carcinoma in situ of skin of other parts of face: Secondary | ICD-10-CM | POA: Diagnosis not present

## 2021-01-24 DIAGNOSIS — D485 Neoplasm of uncertain behavior of skin: Secondary | ICD-10-CM | POA: Diagnosis not present

## 2021-01-24 DIAGNOSIS — D044 Carcinoma in situ of skin of scalp and neck: Secondary | ICD-10-CM | POA: Diagnosis not present

## 2021-01-24 DIAGNOSIS — Z85828 Personal history of other malignant neoplasm of skin: Secondary | ICD-10-CM | POA: Diagnosis not present

## 2021-01-24 DIAGNOSIS — D1801 Hemangioma of skin and subcutaneous tissue: Secondary | ICD-10-CM | POA: Diagnosis not present

## 2021-01-24 DIAGNOSIS — L821 Other seborrheic keratosis: Secondary | ICD-10-CM | POA: Diagnosis not present

## 2021-01-25 LAB — CUP PACEART REMOTE DEVICE CHECK
Battery Remaining Longevity: 36 mo
Battery Remaining Percentage: 45 %
Brady Statistic RV Percent Paced: 52 %
Date Time Interrogation Session: 20220302031100
Implantable Lead Implant Date: 19960809
Implantable Lead Location: 753860
Implantable Lead Model: 4285
Implantable Lead Serial Number: 209144
Implantable Pulse Generator Implant Date: 20130614
Lead Channel Impedance Value: 892 Ohm
Lead Channel Pacing Threshold Amplitude: 0.9 V
Lead Channel Pacing Threshold Pulse Width: 0.4 ms
Lead Channel Setting Pacing Amplitude: 1.4 V
Lead Channel Setting Pacing Pulse Width: 0.4 ms
Lead Channel Setting Sensing Sensitivity: 2.5 mV
Pulse Gen Serial Number: 115653

## 2021-01-29 ENCOUNTER — Ambulatory Visit: Payer: Self-pay | Admitting: General Surgery

## 2021-01-29 DIAGNOSIS — Z961 Presence of intraocular lens: Secondary | ICD-10-CM | POA: Diagnosis not present

## 2021-01-29 DIAGNOSIS — H52203 Unspecified astigmatism, bilateral: Secondary | ICD-10-CM | POA: Diagnosis not present

## 2021-01-29 DIAGNOSIS — H353112 Nonexudative age-related macular degeneration, right eye, intermediate dry stage: Secondary | ICD-10-CM | POA: Diagnosis not present

## 2021-01-31 NOTE — Progress Notes (Signed)
Remote pacemaker transmission.   

## 2021-02-04 ENCOUNTER — Other Ambulatory Visit: Payer: Self-pay | Admitting: Internal Medicine

## 2021-02-04 DIAGNOSIS — M1711 Unilateral primary osteoarthritis, right knee: Secondary | ICD-10-CM | POA: Diagnosis not present

## 2021-02-05 MED ORDER — VERAPAMIL HCL ER 240 MG PO TBCR: 240.0000 mg | EXTENDED_RELEASE_TABLET | Freq: Every day | ORAL | 0 refills | Status: DC

## 2021-02-14 ENCOUNTER — Other Ambulatory Visit: Payer: Self-pay | Admitting: Internal Medicine

## 2021-02-14 NOTE — Telephone Encounter (Signed)
Eliquis 5mg  refill request received. Patient is 87 years old, weight-71kg, Crea-1.00 on 08/13/20, Diagnosis-Afib, and last seen by Dr. Caryl Comes on 01/30/2020 and pending appt on 02/19/2021. Dose is appropriate based on dosing criteria. Will send in refill to requested pharmacy.

## 2021-02-18 NOTE — Patient Instructions (Addendum)
Please stop by lab before you go If you have mychart- we will send your results within 3 business days of [place] receiving them.  If you do not have mychart- we will call you about results within 5 business days of [place] receiving them.  *please also note that you will see labs on mychart as soon as they post. I will later go in and write notes on them- will say "notes from Dr. Yong Channel"  Please check with your pharmacy to see if they have the shingrix vaccine. If they do- please get this immunization and update [place] by phone call or mychart with dates you receive the vaccine  I wish your wife the best in her workup  I think its reasonable when things are stable with her to consider hernia and or knee surgery if needed- get cleared with cardiology first of course   Recommended follow up: Return in about 6 months (around 08/22/2021) for follow up- or sooner if needed.

## 2021-02-18 NOTE — Progress Notes (Signed)
Phone: [phone]   Subjective:  Patient presents today for their annual physical. Chief complaint-noted.   See problem oriented charting- ROS- full  review of systems was completed and negative  except for: activity decrease from knee and back pain, postnasal drip, leg swelling-worse on the left-patient is on Eliquis so risk of DVT is low-no pain noted, urinary frequency, urinary urgency, joint pain, back pain  The following were reviewed and entered/updated in epic: Past Medical History:  Diagnosis Date  . Allergy   . Asthma    childhood  . Atrial fibrillation -permanent   . BPH (benign prostatic hyperplasia)   . 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 Complete heart block ([place])   . ED (erectile dysfunction)   . GERD (gastroesophageal reflux disease)   . GLUCOSE INTOLERANCE 10/22/2007   no recent issues  . OSA (obstructive sleep apnea)   . Pacemaker BSX    dual  . SUBACUTE BACTERIAL ENDOCARDITIS 1970s   Patient Active Problem List   Diagnosis Date Noted  . PPM-[employer] 02/07/2009    Priority: High  . ATRIAL FIBRILLATION 10/21/2007    Priority: High  . BPPV (benign paroxysmal positional vertigo) 12/10/2017    Priority: Medium  . Bradycardia 02/02/2013    Priority: Medium  . Thrombocytopenia ([place]) 08/04/2011    Priority: Medium  . Obstructive sleep apnea 10/21/2007    Priority: Medium  . Essential hypertension 10/21/2007    Priority: Medium  . GERD 10/21/2007    Priority: Medium  . BPH associated with nocturia 10/21/2007    Priority: Medium  . Erectile dysfunction 05/28/2016    Priority: Low  . Macular degeneration 05/28/2016    Priority: Low  . Right hip pain 08/04/2013    Priority: Low  . 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 Osteopenia 10/22/2007    Priority: Low  . SUBACUTE BACTERIAL ENDOCARDITIS 10/21/2007    Priority: Low  . Aortic atherosclerosis ([place]) 08/13/2020   . Degenerative disc disease, lumbar 07/18/2019  . Chronic low back pain 06/09/2018  . Right knee pain 06/02/2017   Past Surgical History:  Procedure Laterality Date  . INSERT / REPLACE / REMOVE PACEMAKER    . PACEMAKER GENERATOR CHANGE N/A 05/07/2012   Procedure: PACEMAKER GENERATOR CHANGE;  Surgeon: [name], MD;  [facility] C[facility] LAB;  Service: Cardiovascular;  Laterality: N/A;  . PACEMAKER PLACEMENT    . PARTIAL HIP ARTHROPLASTY     2008    Family History  Problem Relation Age of Onset  . Prostate cancer Father   . Heart disease Other        mothers side men- strokes and heart attacks  . Diabetes Brother   . Prostate cancer Brother   . Brain cancer Brother     Medications- reviewed and updated Current Outpatient Medications  Medication Sig Dispense Refill  . acetaminophen (TYLENOL) 500 MG tablet Take 1,000 mg by mouth every 8 (eight) hours as needed for moderate pain.    Marland Kitchen apixaban (ELIQUIS) 5 MG TABS tablet Take 1 tablet (5 mg total) by mouth 2 (two) times daily. Please keep upcoming appt. Thanks 60 tablet 1  . cholecalciferol (VITAMIN D) 1000 UNITS tablet Take 1,000 Units by mouth daily.    Marland Kitchen lisinopril (ZESTRIL) 5 MG tablet Take 0.5-1 tablets (2.5-5 mg total) by mouth daily. (Patient taking differently: Take 2.5-5 mg by mouth See admin instructions. Alternating dosages every other day) 90  tablet 3  . Multiple Vitamins-Minerals (OCUVITE EYE HEALTH FORMULA PO) Take 1 tablet by mouth in the morning and at bedtime.    Marland Kitchen omeprazole (PRILOSEC) 20 MG capsule Take 1 capsule (20 mg total) by mouth daily. (Patient taking differently: Take 20 mg by mouth as needed.) 30 capsule 1  . verapamil (CALAN-SR) 240 MG CR tablet Take 1 tablet (240 mg total) by mouth daily. 90 tablet 0   No current facility-administered medications for this visit.    Allergies-reviewed and updated No Known Allergies  Social History   Social History Narrative   Married. 2 children. 1 grandkid. 1  greatgrandchild.       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, plays bridge on internet, watch reruns, enjoys talking politics   Objective  Objective:  BP (!) 102/56   Pulse 65   Temp 97.8 F (36.6 C) (Temporal)   Ht 5\' 9"  (1.753 m)   Wt 151 lb 3.2 oz (68.6 kg)   SpO2 98%   BMI 22.33 kg/m  Gen: NAD, resting comfortably HEENT: Mucous membranes are moist. Oropharynx normal Neck: no thyromegaly CV: regular rateno murmurs rubs or gallops Lungs: CTAB no crackles, wheeze, rhonchi Abdomen: soft/nontender/nondistended/normal bowel sounds. No rebound or guarding.  Ext: no edema Skin: warm, dry Neuro: grossly normal, moves all extremities, PERRLA Msk: patient hunches over and grabs at knee with short walk from chair to table and vice versa   Assessment and Plan  87 y.o. male presenting for annual physical.  Health Maintenance counseling: 1. Anticipatory guidance: Patient counseled regarding regular dental exams -q6 months, eye exams - yearly,  avoiding smoking and second hand smoke , limiting alcohol to 2 beverages per day - rare alcohol .   2. Risk factor reduction:  Advised patient of need for regular exercise and diet rich and fruits and vegetables to reduce risk of heart attack and stroke. Exercise- limited by pain and hernias- doing some gardening and doing some push mowing in summer. Diet-reasonably healthy diet- discussed would prefer to keep weight above 150.  Wt Readings from Last 3 Encounters:  02/19/21 151 lb 3.2 oz (68.6 kg)  10/16/20 156 lb 9.6 oz (71 kg)  08/13/20 156 lb 12.8 oz (71.1 kg)  3. Immunizations/screenings/ancillary studies- discussed shingrix at pharmacy Immunization History  Administered Date(s) Administered  . Fluad Quad(high Dose 65+) 08/23/2019, 08/13/2020  . Influenza Split 08/02/2012  . Influenza Whole 09/27/2007, 08/30/2008, 08/16/2009, 08/05/2010  . Influenza, High Dose Seasonal PF 10/05/2017, 09/17/2018   . Influenza,inj,Quad PF,6+ Mos 08/04/2013, 10/04/2014, 10/08/2015, 09/29/2016  . PFIZER(Purple Top)SARS-COV-2 Vaccination 12/29/2018, 01/12/2019, 09/15/2020  . Pneumococcal Conjugate-13 05/25/2015  . Pneumococcal Polysaccharide-23 10/22/2007  . Tdap 05/25/2015  4. Prostate cancer screening- past age based screening recommendations. Saw urology to age 43 and was told not to check PSAs further  Lab Results  Component Value Date   PSA 0.90 07/29/2011   5. Colon cancer screening - no blood in stool or melena. Past age based screening recs.  6. Skin cancer screening- sees derm at least yearly. advised regular sunscreen use- mainly avoids sun exposure 7. Never smoker 8. STD screening - not needed- only active with wife  Status of chronic or acute concerns   # Right knee pain #Degenerative disc disease -patient has been told not a great candidate for surgery due to age for his knee.  Has bone-on-bone arthritis. Knee has been bothering him the most recently.  -05/29/2020 followed  by Dr. Alfonso Ramus at [facility] for Lumbar spondylosis and DJD. will follow up after CT lumbar - essentially arthritic back but there is not much that can be done. in passing facet joint injections discussed.  Short-term relief with oral steroids in the past -back hip and knee injections in past. Back with little help. 1st hip minimal improvement and 2nd several weeks of improvement but more tolerable. Knee injection recently was not helpful- within a month -has appointment this afternoon with cardiology- with knee pain being pretty disabling for him (does ok with rest thankfully)- has to limber knee up just to get to the restroom for instance. Walking with cane. With decrease in quality of life I would be open to him potentially pursuing replacement  #Bilateral inguinal hernias-referred to [facility] surgery previously.  We discussed he would need cardiac clearance for this last visit- he has visit this afternoon for  clearance  # Atrial fibrillation with history of secondary cardiomyopathy-Dr. Olin Pia notes have mention tachycardia induced related atrial fibrillation-does not appear to be an issue with patient being rate controlled.  Continue to follow Dr. Hortencia Pilar with pacemaker for symptomatic bradycardia  S: Rate controlled with diltiazem 240 mg Anticoagulated with Eliquis 5 mg twice daily  A/P: overall stable- continue current meds    #thrombocytopenia- intermittent issues over years- not noted last check- update today Lab Results  Component Value Date   WBC 5.6 08/13/2020   HGB 14.2 08/13/2020   HCT 42.5 08/13/2020   MCV 92.6 08/13/2020   PLT 175 08/13/2020   #hypertension S: medication: Lisinopril 2.5 mg and 5 mg on alternate days as well as verapamil-though primarily for A. fib BP Readings from Last 3 Encounters:  02/19/21 (!) 102/56  10/16/20 128/70  08/21/20 125/67  A/P: low normal but no lightheadedness or dizziness. Continue current meds for now- traditionally BP has run higher.   #hyperlipidemia #Aortic atherosclerosis S: Medication: None Lab Results  Component Value Date   CHOL 160 08/13/2020   HDL 77 08/13/2020   LDLCALC 70 08/13/2020   TRIG 53 08/13/2020   CHOLHDL 2.1 08/13/2020   A/P: LDL right at goal of 70 or less in September-May remain off medicine for now-repeat lipids at next visit  Recommended follow up: Return in about 6 months (around 08/22/2021) for follow up- or sooner if needed. Future Appointments  Date Time Provider [place]  02/19/2021  3:00 PM [name], MD [facility]  02/21/2021 10:00 AM [facility] PAT 1 [facility] None  02/25/2021 10:15 AM [facility] None  04/24/2021  8:05 AM [facility] DEVICE REMOTES [facility]  07/24/2021  8:05 AM [facility] DEVICE REMOTES [facility]  10/23/2021  8:05 AM [facility] DEVICE REMOTES [facility]  01/22/2022  8:05 AM [facility] DEVICE REMOTES  [facility]  04/23/2022  8:05 AM [facility] DEVICE REMOTES [facility]    Lab/Order associations:   ICD-10-CM   1. Essential hypertension  I10   2. Gastroesophageal reflux disease without esophagitis  K21.9   3. Secondary cardiomyopathy (HCC) Chronic I42.9   4. Aortic atherosclerosis (HCC) Chronic I70.0   5. Atrial fibrillation, unspecified type (HCC) Chronic I48.91   6. Thrombocytopenia ([place]) Chronic D69.6     No orders of the defined types were placed in this encounter.    Return precautions advised.  Garret Reddish, MD

## 2021-02-19 ENCOUNTER — Encounter: Payer: Self-pay | Admitting: Internal Medicine

## 2021-02-19 ENCOUNTER — Ambulatory Visit (INDEPENDENT_AMBULATORY_CARE_PROVIDER_SITE_OTHER): Payer: Medicare HMO | Admitting: Family Medicine

## 2021-02-19 ENCOUNTER — Other Ambulatory Visit: Payer: Self-pay

## 2021-02-19 ENCOUNTER — Ambulatory Visit: Payer: Medicare HMO | Admitting: Internal Medicine

## 2021-02-19 ENCOUNTER — Encounter: Payer: Self-pay | Admitting: Family Medicine

## 2021-02-19 VITALS — BP 122/70 | HR 81 | Ht 69.0 in | Wt 153.4 lb

## 2021-02-19 VITALS — BP 102/56 | HR 65 | Temp 97.8°F | Ht 69.0 in | Wt 151.2 lb

## 2021-02-19 DIAGNOSIS — I429 Cardiomyopathy, unspecified: Secondary | ICD-10-CM

## 2021-02-19 DIAGNOSIS — D696 Thrombocytopenia, unspecified: Secondary | ICD-10-CM | POA: Diagnosis not present

## 2021-02-19 DIAGNOSIS — Z0000 Encounter for general adult medical examination without abnormal findings: Secondary | ICD-10-CM

## 2021-02-19 DIAGNOSIS — K219 Gastro-esophageal reflux disease without esophagitis: Secondary | ICD-10-CM | POA: Diagnosis not present

## 2021-02-19 DIAGNOSIS — I4821 Permanent atrial fibrillation: Secondary | ICD-10-CM | POA: Diagnosis not present

## 2021-02-19 DIAGNOSIS — Z950 Presence of cardiac pacemaker: Secondary | ICD-10-CM | POA: Diagnosis not present

## 2021-02-19 DIAGNOSIS — I4891 Unspecified atrial fibrillation: Secondary | ICD-10-CM | POA: Diagnosis not present

## 2021-02-19 DIAGNOSIS — R001 Bradycardia, unspecified: Secondary | ICD-10-CM

## 2021-02-19 DIAGNOSIS — I10 Essential (primary) hypertension: Secondary | ICD-10-CM

## 2021-02-19 DIAGNOSIS — I700 Atherosclerosis of aorta: Secondary | ICD-10-CM

## 2021-02-19 LAB — COMPREHENSIVE METABOLIC PANEL
ALT: 10 U/L (ref 0–53)
AST: 12 U/L (ref 0–37)
Albumin: 4 g/dL (ref 3.5–5.2)
Alkaline Phosphatase: 74 U/L (ref 39–117)
BUN: 32 mg/dL — ABNORMAL HIGH (ref 6–23)
CO2: 31 mEq/L (ref 19–32)
Calcium: 9.3 mg/dL (ref 8.4–10.5)
Chloride: 102 mEq/L (ref 96–112)
Creatinine, Ser: 0.97 mg/dL (ref 0.40–1.50)
GFR: 69.14 mL/min (ref >60.00)
Glucose, Bld: 87 mg/dL (ref 70–99)
Potassium: 4.2 mEq/L (ref 3.5–5.1)
Sodium: 139 mEq/L (ref 135–145)
Total Bilirubin: 0.7 mg/dL (ref 0.2–1.2)
Total Protein: 6.4 g/dL (ref 6.0–8.3)

## 2021-02-19 LAB — CBC WITH DIFFERENTIAL/PLATELET
Basophils Absolute: 0 10*3/uL (ref 0.0–0.1)
Basophils Relative: 0.3 % (ref 0.0–3.0)
Eosinophils Absolute: 0 10*3/uL (ref 0.0–0.7)
Eosinophils Relative: 0.7 % (ref 0.0–5.0)
HCT: 43.4 % (ref 39.0–52.0)
Hemoglobin: 14.6 g/dL (ref 13.0–17.0)
Lymphocytes Relative: 18.8 % (ref 12.0–46.0)
Lymphs Abs: 1.3 10*3/uL (ref 0.7–4.0)
MCHC: 33.6 g/dL (ref 30.0–36.0)
MCV: 91.6 fl (ref 78.0–100.0)
Monocytes Absolute: 0.6 10*3/uL (ref 0.1–1.0)
Monocytes Relative: 8.7 % (ref 3.0–12.0)
Neutro Abs: 5 10*3/uL (ref 1.4–7.7)
Neutrophils Relative %: 71.5 % (ref 43.0–77.0)
Platelets: 170 10*3/uL (ref 150.0–400.0)
RBC: 4.74 Mil/uL (ref 4.22–5.81)
RDW: 14.3 % (ref 11.5–15.5)
WBC: 6.9 10*3/uL (ref 4.0–10.5)

## 2021-02-19 NOTE — Progress Notes (Signed)
DUE TO COVID-19 ONLY ONE VISITOR IS ALLOWED TO COME WITH YOU AND STAY IN THE WAITING ROOM ONLY DURING PRE OP AND PROCEDURE DAY OF SURGERY. THE 1 VISITOR  MAY VISIT WITH YOU AFTER SURGERY IN YOUR PRIVATE ROOM DURING VISITING HOURS ONLY!  YOU NEED TO HAVE A COVID 19 TEST ON___4/4/22____ @_______ , THIS TEST MUST BE DONE BEFORE SURGERY,  COVID TESTING SITE [address] [place] [address], IT IS ON THE RIGHT GOING OUT [address] APPROXIMATELY  2 MINUTES PAST [employer] ON THE RIGHT. ONCE YOUR COVID TEST IS COMPLETED,  PLEASE BEGIN THE QUARANTINE INSTRUCTIONS AS OUTLINED IN YOUR HANDOUT.                Richie Bonanno [facility]  02/19/2021   Your procedure is scheduled on:  02/28/21  Report to [facility] Main  Entrance   Report to admitting at    1230pm     Call this number if you have problems the morning of surgery [phone]    REMEMBER: NO  SOLID FOOD CANDY OR GUM AFTER MIDNIGHT. CLEAR LIQUIDS UNTIL   1130am       . NOTHING BY MOUTH EXCEPT CLEAR LIQUIDS UNTIL   1130am  . PLEASE FINISH ENSURE DRINK PER SURGEON ORDER  WHICH NEEDS TO BE COMPLETED AT  1130am     .      CLEAR LIQUID DIET   Foods Allowed                                                                    Coffee and tea, regular and decaf                            Fruit ices (not with fruit pulp)                                      Iced Popsicles                                    Carbonated beverages, regular and diet                                    Cranberry, grape and apple juices Sports drinks like Gatorade Lightly seasoned clear broth or consume(fat free) Sugar, honey syrup ___________________________________________________________________      BRUSH YOUR TEETH MORNING OF SURGERY AND RINSE YOUR MOUTH OUT, NO CHEWING GUM CANDY OR MINTS.     Take these medicines the morning of surgery with A SIP OF WATER:   DO NOT TAKE verapamilANY DIABETIC MEDICATIONS DAY OF YOUR SURGERY                                You may not have any metal on your body including hair pins and              piercings  Do not wear jewelry, make-up,  lotions, powders or perfumes, deodorant             Do not wear nail polish on your fingernails.  Do not shave  48 hours prior to surgery.              Men may shave face and neck.   Do not bring valuables to the hospital. [place].  Contacts, dentures or bridgework may not be worn into surgery.  Leave suitcase in the car. After surgery it may be brought to your room.     Patients discharged the day of surgery will not be allowed to drive home. IF YOU ARE HAVING SURGERY AND GOING HOME THE SAME DAY, YOU MUST HAVE AN ADULT TO DRIVE YOU HOME AND BE WITH YOU FOR 24 HOURS. YOU MAY GO HOME BY TAXI OR UBER OR ORTHERWISE, BUT AN ADULT MUST ACCOMPANY YOU HOME AND STAY WITH YOU FOR 24 HOURS.  Name and phone number of your driver:  Special Instructions: N/A              Please read over the following fact sheets you were given: _____________________________________________________________________  [facility] - Preparing for Surgery Before surgery, you can play an important role.  Because skin is not sterile, your skin needs to be as free of germs as possible.  You can reduce the number of germs on your skin by washing with CHG (chlorahexidine gluconate) soap before surgery.  CHG is an antiseptic cleaner which kills germs and bonds with the skin to continue killing germs even after washing. Please DO NOT use if you have an allergy to CHG or antibacterial soaps.  If your skin becomes reddened/irritated stop using the CHG and inform your nurse when you arrive at Short Stay. Do not shave (including legs and underarms) for at least 48 hours prior to the first CHG shower.  You may shave your face/neck. Please follow these instructions carefully:  1.  Shower with CHG Soap the night before surgery and the  morning of  Surgery.  2.  If you choose to wash your hair, wash your hair first as usual with your  normal  shampoo.  3.  After you shampoo, rinse your hair and body thoroughly to remove the  shampoo.                           4.  Use CHG as you would any other liquid soap.  You can apply chg directly  to the skin and wash                       Gently with a scrungie or clean washcloth.  5.  Apply the CHG Soap to your body ONLY FROM THE NECK DOWN.   Do not use on face/ open                           Wound or open sores. Avoid contact with eyes, ears mouth and genitals (private parts).                       Wash face,  Genitals (private parts) with your normal soap.             6.  Wash thoroughly, paying special attention to  the area where your surgery  will be performed.  7.  Thoroughly rinse your body with warm water from the neck down.  8.  DO NOT shower/wash with your normal soap after using and rinsing off  the CHG Soap.                9.  Pat yourself dry with a clean towel.            10.  Wear clean pajamas.            11.  Place clean sheets on your bed the night of your first shower and do not  sleep with pets. Day of Surgery : Do not apply any lotions/deodorants the morning of surgery.  Please wear clean clothes to the hospital/surgery center.  FAILURE TO FOLLOW THESE INSTRUCTIONS MAY RESULT IN THE CANCELLATION OF YOUR SURGERY PATIENT SIGNATURE_________________________________  NURSE SIGNATURE__________________________________  ________________________________________________________________________

## 2021-02-19 NOTE — Progress Notes (Signed)
Patient Care Team: Marin Olp, MD as PCP - General (Family Medicine) [name], MD as PCP - Electrophysiology (Cardiology) [name], MD as PCP - Cardiology (Cardiology) Berle Mull, MD as Consulting Physician (Sports Medicine)   HPI  Eric Lambert is a 87 y.o. 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  Longstanding cough in the setting of his ACE inhibitor.  He has elected to stay on it  Anticoagulation with apixaban   Hopes to have knee surgery  Limitations of activity are orthopedic,  No dyspnea or chest pain, no edema  His wife is sick with a pulm mass the cause of which is still being sought-- he is burdened  , Date Cr K Hgb  1/19 0.9  14.4   8/20 1.26  14.5  3/22 0.97 4.2 14.6                    Last echo was 2008.   Past Medical History:  Diagnosis Date  . Allergy   . Asthma    childhood  . Atrial fibrillation -permanent   . BPH (benign prostatic hyperplasia)   . CHF (congestive heart failure) ([place])    resolved after pacemaker - tachycardia induced  . Complete heart block ([place])   . ED (erectile dysfunction)   . GERD (gastroesophageal reflux disease)   . GLUCOSE INTOLERANCE 10/22/2007   no recent issues  . OSA (obstructive sleep apnea)   . Pacemaker BSX    dual  . SUBACUTE BACTERIAL ENDOCARDITIS 1970s    Past Surgical History:  Procedure Laterality Date  . INSERT / REPLACE / REMOVE PACEMAKER    . PACEMAKER GENERATOR CHANGE N/A 05/07/2012   Procedure: PACEMAKER GENERATOR CHANGE;  Surgeon: [name], MD;  Location: [facility] CATH LAB;  Service: Cardiovascular;  Laterality: N/A;  . PACEMAKER PLACEMENT    . PARTIAL HIP ARTHROPLASTY     2008    Current Outpatient Medications  Medication Sig Dispense Refill  . acetaminophen (TYLENOL) 500 MG  tablet Take 1,000 mg by mouth every 8 (eight) hours as needed for moderate pain.    Marland Kitchen apixaban (ELIQUIS) 5 MG TABS tablet Take 1 tablet (5 mg total) by mouth 2 (two) times daily. Please keep upcoming appt. Thanks 60 tablet 1  . cholecalciferol (VITAMIN D) 1000 UNITS tablet Take 1,000 Units by mouth daily.    Marland Kitchen lisinopril (ZESTRIL) 5 MG tablet Take 0.5-1 tablets (2.5-5 mg total) by mouth daily. 90 tablet 3  . Multiple Vitamins-Minerals (OCUVITE EYE HEALTH FORMULA PO) Take 1 tablet by mouth in the morning and at bedtime.    Marland Kitchen omeprazole (PRILOSEC) 20 MG capsule Take 1 capsule (20 mg total) by mouth daily. 30 capsule 1  . verapamil (CALAN-SR) 240 MG CR tablet Take 1 tablet (240 mg total) by mouth daily. 90 tablet 0   No current facility-administered medications for this visit.    No Known Allergies  Review of Systems negative except from HPI and PMH  Physical Exam BP 122/70   Pulse 81   Ht 5\' 9"  (1.753 m)   Wt 153 lb 6.4 oz (69.6 kg)   SpO2 96%   BMI 22.65 kg/m  Well developed and well nourished in no acute distress HENT normal Neck supple with JVP-flat Clear Device pocket well healed; without hematoma or erythema.  There is no  tethering  Irregularly irregular rate   and rhythm, no  gallop /2/6 murmur Abd-soft with active BS No Clubbing cyanosis   edema Skin-warm and dry A & Oriented  Grossly normal sensory and motor function  ECG afib @ 81 -/10/37    Assessment and  Plan  Atrial fibrillation  Permanent    Pacemaker-[employer]  The patient's device was interrogated.  The information was reviewed. No changes were made in the programming.          Bradycardia  Hypertension   Renal function grade 3  Preoperative consultation  VT NS    Atrial fibrillation-- on anticoagulation without bleeding  Device function normal  His risk is clearly elevated for knee replacement--at his age alone.  His functional status is hard to assess but his limitations are  orthopedic  No known CAD but will get echo as has hx of cardiomyopathy with normalization of LVEF  If ok would clear

## 2021-02-19 NOTE — Patient Instructions (Signed)
Medication Instructions:  Your physician recommends that you continue on your current medications as directed. Please refer to the Current Medication list given to you today.  *If you need a refill on your cardiac medications before your next appointment, please call your pharmacy*   Lab Work: None ordered.  If you have labs (blood work) drawn today and your tests are completely normal, you will receive your results only by: Marland Kitchen MyChart Message (if you have MyChart) OR . A paper copy in the mail If you have any lab test that is abnormal or we need to change your treatment, we will call you to review the results.   Testing/Procedures: Your physician has requested that you have an echocardiogram. Echocardiography is a painless test that uses sound waves to create images of your heart. It provides your doctor with information about the size and shape of your heart and how well your heart's chambers and valves are working. This procedure takes approximately one hour. There are no restrictions for this procedure.      Follow-Up: At [facility], you and your health needs are our priority.  As part of our continuing mission to provide you with exceptional heart care, we have created designated Provider Care Teams.  These Care Teams include your primary Cardiologist (physician) and Advanced Practice Providers (APPs -  Physician Assistants and Nurse Practitioners) who all work together to provide you with the care you need, when you need it.  We recommend signing up for the patient portal called "MyChart".  Sign up information is provided on this After Visit Summary.  MyChart is used to connect with patients for Virtual Visits (Telemedicine).  Patients are able to view lab/test results, encounter notes, upcoming appointments, etc.  Non-urgent messages can be sent to your provider as well.   To learn more about what you can do with MyChart, go to [email or web address].    Your next appointment:    12 months  The format for your next appointment:   In Person  Provider:   Virl Axe, MD

## 2021-02-21 ENCOUNTER — Other Ambulatory Visit: Payer: Self-pay

## 2021-02-21 ENCOUNTER — Encounter (HOSPITAL_COMMUNITY)
Admission: RE | Admit: 2021-02-21 | Discharge: 2021-02-21 | Disposition: A | Discharge status: Home / Self Care | Payer: Medicare HMO | Source: Ambulatory Visit | Attending: General Surgery | Admitting: General Surgery

## 2021-02-21 ENCOUNTER — Encounter (HOSPITAL_COMMUNITY): Payer: Self-pay | Admitting: Physician Assistant

## 2021-02-21 ENCOUNTER — Encounter (HOSPITAL_COMMUNITY): Payer: Self-pay

## 2021-02-21 ENCOUNTER — Telehealth: Payer: Self-pay | Admitting: *Deleted

## 2021-02-21 DIAGNOSIS — Z01818 Encounter for other preprocedural examination: Secondary | ICD-10-CM | POA: Diagnosis not present

## 2021-02-21 HISTORY — DX: Cardiac murmur, unspecified: R01.1

## 2021-02-21 HISTORY — DX: Presence of cardiac pacemaker: Z95.0

## 2021-02-21 HISTORY — DX: Pneumonia, unspecified organism: J18.9

## 2021-02-21 HISTORY — DX: Unspecified osteoarthritis, unspecified site: M19.90

## 2021-02-21 HISTORY — DX: Malignant (primary) neoplasm, unspecified: C80.1

## 2021-02-21 LAB — CBC
HCT: 43.1 % (ref 39.0–52.0)
Hemoglobin: 14.3 g/dL (ref 13.0–17.0)
MCH: 30.8 pg (ref 26.0–34.0)
MCHC: 33.2 g/dL (ref 30.0–36.0)
MCV: 92.7 fL (ref 80.0–100.0)
Platelets: 159 10*3/uL (ref 150–400)
RBC: 4.65 MIL/uL (ref 4.22–5.81)
RDW: 14 % (ref 11.5–15.5)
WBC: 5.1 10*3/uL (ref 4.0–10.5)
nRBC: 0 % (ref 0.0–0.2)

## 2021-02-21 LAB — BASIC METABOLIC PANEL
Anion gap: 6 (ref 5–15)
BUN: 30 mg/dL — ABNORMAL HIGH (ref 8–23)
CO2: 27 mmol/L (ref 22–32)
Calcium: 9.1 mg/dL (ref 8.9–10.3)
Chloride: 104 mmol/L (ref 98–111)
Creatinine, Ser: 0.98 mg/dL (ref 0.61–1.24)
GFR, Estimated: >60 mL/min (ref >60)
Glucose, Bld: 96 mg/dL (ref 70–99)
Potassium: 4.2 mmol/L (ref 3.5–5.1)
Sodium: 137 mmol/L (ref 135–145)

## 2021-02-21 NOTE — Progress Notes (Addendum)
Anesthesia Review:  PCP:  DR Garret Reddish  Cardiologist : DR Lowella Fairy 02/19/21 Last Device Check- pacemaker- 01/30/21 Device ORders Requested on 02/21/21 Chest x-ray : EKG :02/19/21  Echo : Stress test: Cardiac Cath :  Activity level: can do slowly pt walks with cane  Sleep Study/ CPAP : cpap is in closet pt reports has not used in years  Fasting Blood Sugar :      / Checks Blood Sugar -- times a day:   Blood Thinner/ Instructions /Last Dose: ASA / Instructions/ Last Dose :  Eliquis _ pt reports not having received instructions  Called office of [facility] and spoke with Sharyn Lull at Triage and informed that pt reports he has not yet received any preop instructions regarding Eliquis prior to surgery.  Also pt to call office of cardiology and surgeon and request preop instructions regarding Eliquis.  PT voiced understanding.

## 2021-02-21 NOTE — Telephone Encounter (Signed)
   [facility] Pre-operative Risk Assessment    HEARTCARE STAFF: - Please ensure there is not already an duplicate clearance open for this procedure. - Under Visit Info/Reason for Call, type in Other and utilize the format Clearance MM/DD/YY or Clearance TBD. Do not use dashes or single digits. - If request is for dental extraction, please clarify the # of teeth to be extracted.  Request for surgical clearance:  1. What type of surgery is being performed? HERNIA REPAIR   2. When is this surgery scheduled? TBD   3. What type of clearance is required (medical clearance vs. Pharmacy clearance to hold med vs. Both)? BOTH  4. Are there any medications that need to be held prior to surgery and how long? ELIQUIS x 2 DAYS PRIOR   5. Practice name and name of physician performing surgery? [facility]; DR. Kieth Brightly   6. What is the office phone number? [phone]   7.   What is the office fax number? [place], CMA  8.   Anesthesia type (None, local, MAC, general) ? GENERAL   Julaine Hua 02/21/2021, 12:06 PM  _________________________________________________________________   (provider comments below)

## 2021-02-21 NOTE — Telephone Encounter (Signed)
Patient with diagnosis of afib on Eliquis for anticoagulation.    Procedure: hernia repair Date of procedure: TBD  CHA2DS2-VASc Score = 5  This indicates a 7.2% annual risk of stroke. The patient's score is based upon: CHF History: Yes HTN History: Yes Diabetes History: No Stroke History: No Vascular Disease History: Yes Age Score: 2 Gender Score: 0  CrCl 90mL/min Platelet count 159K  Per office protocol, patient can hold Eliquis for 2 days prior to procedure.

## 2021-02-21 NOTE — Telephone Encounter (Signed)
Patient seen by Dr. Caryl Comes on 02/19/2021.  He was doing fairly well at that time.  However due to his own physical activity it was hard to determine his functional status.  Will await results of echocardiogram.

## 2021-02-22 ENCOUNTER — Encounter: Payer: Self-pay | Admitting: Internal Medicine

## 2021-02-22 NOTE — Progress Notes (Signed)
[facility] DEVICE PROGRAMMING  Patient Information: Name:  Eric Lambert  DOB:  1929-09-17  MRN:  [id number]    Athena Masse, RN  P Cv [place] Name- thailan sava 07/09/1929  MRN- [id number]  Date of Surgery - 02/28/21  Procedure- Laparoscopic bilateral inguinal hernia repair with mesh  Surgeon- DR Gurney Maxin   Cautery will be used.  Position during surgery:   Please send documentation back to:  [facility] (Fax # [phone])  [facility]  [name], [name], RN  02/21/2021 12:01 PM   Device Information:  Clinic EP Physician:  Virl Axe, MD   Device Type:  Pacemaker Manufacturer and Phone #:  [employer]: [phone] Pacemaker Dependent?:  No. Date of Last Device Check:  02/19/21 Normal Device Function?:  Yes.    Electrophysiologist's Recommendations:   Have magnet available.  Provide continuous ECG monitoring when magnet is used or reprogramming is to be performed.   Procedure should not interfere with device function.  No device programming or magnet placement needed.   These instructions are for device recommendations only.  MD cardiac clearance is pending results of Echocardiogram ordered by MD.     Per Device Clinic Standing Orders, York Ram, RN  2:52 PM 02/22/2021

## 2021-02-25 ENCOUNTER — Other Ambulatory Visit (HOSPITAL_COMMUNITY)
Admission: RE | Admit: 2021-02-25 | Discharge: 2021-02-25 | Disposition: A | Discharge status: Home / Self Care | Payer: Medicare HMO | Source: Ambulatory Visit | Attending: General Surgery | Admitting: General Surgery

## 2021-02-25 DIAGNOSIS — Z20822 Contact with and (suspected) exposure to covid-19: Secondary | ICD-10-CM | POA: Diagnosis not present

## 2021-02-25 DIAGNOSIS — Z01812 Encounter for preprocedural laboratory examination: Secondary | ICD-10-CM | POA: Diagnosis not present

## 2021-02-25 LAB — SARS CORONAVIRUS 2 (TAT 6-24 HRS): SARS Coronavirus 2: NEGATIVE

## 2021-02-26 NOTE — Progress Notes (Signed)
Anesthesia Chart Review   Case: [id number] Date/Time: 02/28/21 1415   Procedure: LAPAROSCOPIC BILATERAL INGUINAL HERNIA REPAIR WITH MESH (Bilateral ) - ROOM 2 STARTING AT 02:30 FOR 90 MIN   Anesthesia type: General   Pre-op diagnosis: BILATERAL INGUINAL HERNIAS   Location: [facility] ROOM 01 / [facility]   Surgeons: Kinsinger, Arta Bruce, MD      DISCUSSION: 87 y.o. never smoker with h/o GERD, asthma, OSA, CHF, atrial fibrillation, pacemaker in place due to CHB (device orders in progress note 02/22/21), bilateral inguinal hernia scheduled for above procedure 02/28/2021.   VS: BP (!) 116/53   Pulse 70   Temp 36.7 C (Oral)   Resp 16   SpO2 98%   PROVIDERS: Marin Olp, MD is PCP    LABS: Labs reviewed: Acceptable for surgery. (all labs ordered are listed, but only abnormal results are displayed)  Labs Reviewed  BASIC METABOLIC PANEL - Abnormal; Notable for the following components:      Result Value   BUN 30 (*)    All other components within normal limits  CBC     IMAGES:   EKG: 02/19/2021 Rate 81 bpm    CV:  Past Medical History:  Diagnosis Date  . Allergy   . Arthritis   . Asthma    childhood  . Atrial fibrillation -permanent   . BPH (benign prostatic hyperplasia)   . Cancer (HCC)    HX OF SKIN CANCER   . CHF (congestive heart failure) ([place])    resolved after pacemaker - tachycardia induced  . Complete heart block ([place])   . ED (erectile dysfunction)   . GERD (gastroesophageal reflux disease)   . GLUCOSE INTOLERANCE 10/22/2007   no recent issues  . Heart murmur   . OSA (obstructive sleep apnea)    NO CPAP   . Pacemaker BSX    dual  . Pneumonia    HX OF SEVERAL TIMES AS A CHILD   . Presence of permanent cardiac pacemaker   . SUBACUTE BACTERIAL ENDOCARDITIS 1970s    Past Surgical History:  Procedure Laterality Date  . INSERT / REPLACE / REMOVE PACEMAKER    . PACEMAKER GENERATOR CHANGE N/A 05/07/2012   Procedure: PACEMAKER GENERATOR CHANGE;  Surgeon:  [name], MD;  Location: [facility] CATH LAB;  Service: Cardiovascular;  Laterality: N/A;  . PACEMAKER PLACEMENT    . PARTIAL HIP ARTHROPLASTY     2008    MEDICATIONS: . acetaminophen (TYLENOL) 500 MG tablet  . apixaban (ELIQUIS) 5 MG TABS tablet  . cholecalciferol (VITAMIN D) 1000 UNITS tablet  . lisinopril (ZESTRIL) 5 MG tablet  . Multiple Vitamins-Minerals (OCUVITE EYE HEALTH FORMULA PO)  . omeprazole (PRILOSEC) 20 MG capsule  . verapamil (CALAN-SR) 240 MG CR tablet   No current facility-administered medications for this encounter.

## 2021-02-27 MED ORDER — BUPIVACAINE LIPOSOME 1.3 % IJ SUSP
20.0000 mL | INTRAMUSCULAR | Status: DC
  Filled 2021-02-27: qty 20

## 2021-02-28 ENCOUNTER — Encounter (HOSPITAL_COMMUNITY): Admission: RE | Payer: Self-pay | Source: Home / Self Care

## 2021-02-28 ENCOUNTER — Ambulatory Visit (HOSPITAL_COMMUNITY): Admission: RE | Admit: 2021-02-28 | Payer: Medicare HMO | Source: Home / Self Care | Admitting: General Surgery

## 2021-02-28 SURGERY — REPAIR, HERNIA, INGUINAL, LAPAROSCOPIC
Anesthesia: General | Laterality: Bilateral

## 2021-03-04 ENCOUNTER — Other Ambulatory Visit: Payer: Self-pay

## 2021-03-04 ENCOUNTER — Ambulatory Visit (HOSPITAL_COMMUNITY): Payer: Medicare HMO | Attending: Cardiology

## 2021-03-04 DIAGNOSIS — I4891 Unspecified atrial fibrillation: Secondary | ICD-10-CM | POA: Insufficient documentation

## 2021-03-04 DIAGNOSIS — I2510 Atherosclerotic heart disease of native coronary artery without angina pectoris: Secondary | ICD-10-CM | POA: Insufficient documentation

## 2021-03-04 DIAGNOSIS — Z950 Presence of cardiac pacemaker: Secondary | ICD-10-CM | POA: Diagnosis not present

## 2021-03-04 DIAGNOSIS — I340 Nonrheumatic mitral (valve) insufficiency: Secondary | ICD-10-CM | POA: Diagnosis not present

## 2021-03-04 DIAGNOSIS — I10 Essential (primary) hypertension: Secondary | ICD-10-CM | POA: Diagnosis not present

## 2021-03-04 DIAGNOSIS — I429 Cardiomyopathy, unspecified: Secondary | ICD-10-CM | POA: Insufficient documentation

## 2021-03-04 LAB — ECHOCARDIOGRAM COMPLETE
Area-P 1/2: 3.21 cm2
S' Lateral: 3.5 cm

## 2021-03-05 NOTE — Telephone Encounter (Signed)
PT is returning a phone call received. Please advise.

## 2021-03-05 NOTE — Telephone Encounter (Signed)
LVM to call back and ask for Pre-op team.

## 2021-03-05 NOTE — Telephone Encounter (Signed)
   Patient Name: Eric Lambert  DOB: Aug 24, 1929  MRN: [id number]   Primary Cardiologist: Virl Axe, MD  Chart reviewed as part of pre-operative protocol coverage. Patient was contacted 03/05/2021 in reference to pre-operative risk assessment for pending surgery as outlined below.  Eric Lambert was last seen on 02/19/21 by Dr. Caryl Comes.  Since that day, Eric Lambert has done well from a cardiac standpoint, no changes or anginal symptoms. Echo showed normal pump function with some mild valvular changes. He is fairly functional at baseline and uses a cane to walk. Function is limited by joint pain. METS>4. Per pharmacy recommendations, okay to hold Eliquis 2 days prior to surgery.   Therefore, based on ACC/AHA guidelines, the patient would be at acceptable risk for the planned procedure without further cardiovascular testing.   The patient was advised that if he develops new symptoms prior to surgery to contact our office to arrange for a follow-up visit, and he verbalized understanding.  I will route this recommendation to the requesting party via Epic fax function and remove from pre-op pool. Please call with questions.  Ethelean Colla Ninfa Meeker, PA-C 03/05/2021, 2:33 PM

## 2021-03-08 ENCOUNTER — Telehealth: Payer: Self-pay | Admitting: Family Medicine

## 2021-03-08 NOTE — Chronic Care Management (AMB) (Signed)
  Chronic Care Management   Outreach Note  03/08/2021 Name: Benoit Meech MRN: [id number] DOB: August 21, 1929  Referred by: Marin Olp, MD Reason for referral : No chief complaint on file.   An unsuccessful telephone outreach was attempted today. The patient was referred to the pharmacist for assistance with care management and care coordination.   Follow Up Plan:   Lauretta Grill Upstream Scheduler

## 2021-03-08 NOTE — Chronic Care Management (AMB) (Signed)
  Chronic Care Management   Note  03/08/2021 Name: Eric Lambert MRN: [id number] DOB: 03-22-1929  Eric Lambert is a 87 y.o. year old male who is a primary care patient of Marin Olp, MD. I reached out to Darcey Nora by phone today in response to a referral sent by Eric Lambert's PCP, Yong Channel Brayton Mars, MD.   Mr. Dentler was given information about Chronic Care Management services today including:  1. CCM service includes personalized support from designated clinical staff supervised by his physician, including individualized plan of care and coordination with other care providers 2. 24/7 contact phone numbers for assistance for urgent and routine care needs. 3. Service will only be billed when office clinical staff spend 20 minutes or more in a month to coordinate care. 4. Only one practitioner may furnish and bill the service in a calendar month. 5. The patient may stop CCM services at any time (effective at the end of the month) by phone call to the office staff.   Patient did not agree to enrollment in care management services and does not wish to consider at this time.  Follow up plan:   Lauretta Grill Upstream Scheduler

## 2021-03-25 ENCOUNTER — Other Ambulatory Visit (HOSPITAL_COMMUNITY): Payer: Medicare HMO

## 2021-03-27 ENCOUNTER — Telehealth: Payer: Self-pay

## 2021-03-27 NOTE — Telephone Encounter (Signed)
-----   Message from [name], MD sent at 03/20/2021  5:52 AM EDT ----- Please Inform Patient Eric Lambert showed  normal# heart muscle function

## 2021-03-27 NOTE — Telephone Encounter (Signed)
Spoke with pt and advised per Dr Caryl Comes echo shows normal heart muscle function.  Pt verbalizes understanding and thanked Therapist, sports for the call.

## 2021-04-01 ENCOUNTER — Encounter (HOSPITAL_COMMUNITY)
Admission: RE | Admit: 2021-04-01 | Discharge: 2021-04-01 | Disposition: A | Discharge status: Home / Self Care | Payer: Medicare HMO | Source: Ambulatory Visit | Attending: General Surgery | Admitting: General Surgery

## 2021-04-01 ENCOUNTER — Encounter (HOSPITAL_COMMUNITY): Payer: Self-pay

## 2021-04-01 ENCOUNTER — Other Ambulatory Visit: Payer: Self-pay

## 2021-04-01 ENCOUNTER — Ambulatory Visit: Payer: Self-pay | Admitting: General Surgery

## 2021-04-01 DIAGNOSIS — K219 Gastro-esophageal reflux disease without esophagitis: Secondary | ICD-10-CM | POA: Diagnosis not present

## 2021-04-01 DIAGNOSIS — G4733 Obstructive sleep apnea (adult) (pediatric): Secondary | ICD-10-CM | POA: Insufficient documentation

## 2021-04-01 DIAGNOSIS — K4020 Bilateral inguinal hernia, without obstruction or gangrene, not specified as recurrent: Secondary | ICD-10-CM | POA: Insufficient documentation

## 2021-04-01 DIAGNOSIS — Z7901 Long term (current) use of anticoagulants: Secondary | ICD-10-CM | POA: Diagnosis not present

## 2021-04-01 DIAGNOSIS — Z79899 Other long term (current) drug therapy: Secondary | ICD-10-CM | POA: Diagnosis not present

## 2021-04-01 DIAGNOSIS — Z9989 Dependence on other enabling machines and devices: Secondary | ICD-10-CM | POA: Diagnosis not present

## 2021-04-01 DIAGNOSIS — Z950 Presence of cardiac pacemaker: Secondary | ICD-10-CM | POA: Diagnosis not present

## 2021-04-01 DIAGNOSIS — Z01812 Encounter for preprocedural laboratory examination: Secondary | ICD-10-CM | POA: Insufficient documentation

## 2021-04-01 DIAGNOSIS — I509 Heart failure, unspecified: Secondary | ICD-10-CM | POA: Insufficient documentation

## 2021-04-01 HISTORY — DX: Other specified postprocedural states: Z98.890

## 2021-04-01 HISTORY — DX: Other specified postprocedural states: R11.2

## 2021-04-01 LAB — BASIC METABOLIC PANEL
Anion gap: 8 (ref 5–15)
BUN: 30 mg/dL — ABNORMAL HIGH (ref 8–23)
CO2: 28 mmol/L (ref 22–32)
Calcium: 9.1 mg/dL (ref 8.9–10.3)
Chloride: 102 mmol/L (ref 98–111)
Creatinine, Ser: 0.93 mg/dL (ref 0.61–1.24)
GFR, Estimated: >60 mL/min (ref >60)
Glucose, Bld: 99 mg/dL (ref 70–99)
Potassium: 4.2 mmol/L (ref 3.5–5.1)
Sodium: 138 mmol/L (ref 135–145)

## 2021-04-01 LAB — CBC
HCT: 43.3 % (ref 39.0–52.0)
Hemoglobin: 14.1 g/dL (ref 13.0–17.0)
MCH: 30.7 pg (ref 26.0–34.0)
MCHC: 32.6 g/dL (ref 30.0–36.0)
MCV: 94.1 fL (ref 80.0–100.0)
Platelets: 167 10*3/uL (ref 150–400)
RBC: 4.6 MIL/uL (ref 4.22–5.81)
RDW: 13.3 % (ref 11.5–15.5)
WBC: 4.9 10*3/uL (ref 4.0–10.5)
nRBC: 0 % (ref 0.0–0.2)

## 2021-04-01 NOTE — Progress Notes (Signed)
DUE TO COVID-19 ONLY ONE VISITOR IS ALLOWED TO COME WITH YOU AND STAY IN THE WAITING ROOM ONLY DURING PRE OP AND PROCEDURE DAY OF SURGERY. THE 1 VISITOR  MAY VISIT WITH YOU AFTER SURGERY IN YOUR PRIVATE ROOM DURING VISITING HOURS ONLY!  YOU NEED TO HAVE A COVID 19 TEST ON_5/10/2021 ______ @_______ , THIS TEST MUST BE DONE BEFORE SURGERY,  COVID TESTING SITE [address] [place] [address], IT IS ON THE RIGHT GOING OUT [address] APPROXIMATELY  2 MINUTES PAST [employer] ON THE RIGHT. ONCE YOUR COVID TEST IS COMPLETED,  PLEASE BEGIN THE QUARANTINE INSTRUCTIONS AS OUTLINED IN YOUR HANDOUT.                Norwin Aleman [facility]  04/01/2021   Your procedure is scheduled on:  04/08/2021   Report to [facility] Main  Entrance   Report to admitting at     AM     Call this number if you have problems the morning of surgery [phone]    Remember: Do not eat food , candy gum or mints :After Midnight. You may have clear liquids from midnight until 0830 am     CLEAR LIQUID DIET   Foods Allowed                                                                       Coffee and tea, regular and decaf                              Plain Jell-O any favor except red or purple                                            Fruit ices (not with fruit pulp)                                      Iced Popsicles                                     Carbonated beverages, regular and diet                                    Cranberry, grape and apple juices Sports drinks like Gatorade Lightly seasoned clear broth or consume(fat free) Sugar, honey syrup   _____________________________________________________________________    BRUSH YOUR TEETH MORNING OF SURGERY AND RINSE YOUR MOUTH OUT, NO CHEWING GUM CANDY OR MINTS.     Take these medicines the morning of surgery with A SIP OF WATER:     [place]  DO NOT TAKE ANY DIABETIC MEDICATIONS DAY OF YOUR SURGERY                                You may not have  any metal on your body including hair pins and              piercings  Do not wear jewelry, make-up, lotions, powders or perfumes, deodorant             Do not wear nail polish on your fingernails.  Do not shave  48 hours prior to surgery.              Men may shave face and neck.   Do not bring valuables to the hospital. [place].  Contacts, dentures or bridgework may not be worn into surgery.  Leave suitcase in the car. After surgery it may be brought to your room.     Patients discharged the day of surgery will not be allowed to drive home. IF YOU ARE HAVING SURGERY AND GOING HOME THE SAME DAY, YOU MUST HAVE AN ADULT TO DRIVE YOU HOME AND BE WITH YOU FOR 24 HOURS. YOU MAY GO HOME BY TAXI OR UBER OR ORTHERWISE, BUT AN ADULT MUST ACCOMPANY YOU HOME AND STAY WITH YOU FOR 24 HOURS.  Name and phone number of your driver:  Special Instructions: N/A              Please read over the following fact sheets you were given: _____________________________________________________________________  [facility] - Preparing for Surgery Before surgery, you can play an important role.  Because skin is not sterile, your skin needs to be as free of germs as possible.  You can reduce the number of germs on your skin by washing with CHG (chlorahexidine gluconate) soap before surgery.  CHG is an antiseptic cleaner which kills germs and bonds with the skin to continue killing germs even after washing. Please DO NOT use if you have an allergy to CHG or antibacterial soaps.  If your skin becomes reddened/irritated stop using the CHG and inform your nurse when you arrive at Short Stay. Do not shave (including legs and underarms) for at least 48 hours prior to the first CHG shower.  You may shave your face/neck. Please follow these instructions carefully:  1.  Shower with CHG Soap the night before surgery and the  morning of Surgery.  2.  If you  choose to wash your hair, wash your hair first as usual with your  normal  shampoo.  3.  After you shampoo, rinse your hair and body thoroughly to remove the  shampoo.                           4.  Use CHG as you would any other liquid soap.  You can apply chg directly  to the skin and wash                       Gently with a scrungie or clean washcloth.  5.  Apply the CHG Soap to your body ONLY FROM THE NECK DOWN.   Do not use on face/ open                           Wound or open sores. Avoid contact with eyes, ears mouth and genitals (private parts).                       Wash  face,  Genitals (private parts) with your normal soap.             6.  Wash thoroughly, paying special attention to the area where your surgery  will be performed.  7.  Thoroughly rinse your body with warm water from the neck down.  8.  DO NOT shower/wash with your normal soap after using and rinsing off  the CHG Soap.                9.  Pat yourself dry with a clean towel.            10.  Wear clean pajamas.            11.  Place clean sheets on your bed the night of your first shower and do not  sleep with pets. Day of Surgery : Do not apply any lotions/deodorants the morning of surgery.  Please wear clean clothes to the hospital/surgery center.  FAILURE TO FOLLOW THESE INSTRUCTIONS MAY RESULT IN THE CANCELLATION OF YOUR SURGERY PATIENT SIGNATURE_________________________________  NURSE SIGNATURE__________________________________  ________________________________________________________________________

## 2021-04-01 NOTE — Progress Notes (Addendum)
Anesthesia Review:  PCP: DR Garret Reddish LOV 02/19/21 Cardiologist : DR Virl Axe Clearance- 02/19/21  3/29/22LOV  Pacemaker  Atrial fib  Chest x-ray : EKG : 02/19/21  Echo : 4/111/22 Stress test: Cardiac Cath :  Activity level: cannot do a flight of stiars walks with brace  Sleep Study/ CPAP : sleep apnea no cpap  Fasting Blood Sugar :      / Checks Blood Sugar -- times a day:   Blood Thinner/ Instructions /Last Dose: ASA / Instructions/ Last Dose :  Eliquis- pt has not received any preprocedure instructions regarding Eliquis - Called office of [facility] and spoke with [facility] in Triage that pt has not received Eliquis instructions.  [facility] Triage stated they would call and let pt know preop instructions regarding Eliquis.  Blood pressure at preop appt was 93/41 in left arm and 91/51 in right arm.  Pt denies any dizziness or lightheadedbness.  PT reports checks blood pressure at home and this am was 120/70 approx.  Reported to [facility] above blood pressure readings.  Blood pressure after lab draw was 91/51 in right arm.  Pt again denies any dizxziness or lightheadedness.  Janett Billow Zanettok,PAc made aware.  No new orders given except for pt to monitor blood pressure readings at home and ot call MD if any dizziness or lightheadedness occurs or glood pressure remains low.  PT voices understanding.   Requested orders on 03/27/21, 03/29/21 and 04/01/21.

## 2021-04-03 NOTE — Progress Notes (Signed)
Anesthesia Chart Review   Case: [id number] Date/Time: 04/08/21 1015   Procedure: LAPAROSCOPIC BILATERAL INGUINAL HERNIA REPAIR WITH MESH (Bilateral )   Anesthesia type: General   Pre-op diagnosis: BILATERAL INGUINAL HERNIA   Location: [facility] / [facility]   Surgeons: Kinsinger, Arta Bruce, MD      DISCUSSION:87 y.o. never smoker with h/o PONV, GERD, CHF, pacemaker in place (device orders in 02/22/21 progress note), OSA on CPAP, bilateral inguinal hernia scheduled for above procedure 04/08/2021 with Dr. Gurney Maxin.   Per cardiology preoperative evaluation 03/05/2021, "Chart reviewed as part of pre-operative protocol coverage. Patient was contacted 03/05/2021 in reference to pre-operative risk assessment for pending surgery as outlined below.  Eric Lambert was last seen on 02/19/21 by Dr. Caryl Comes.  Since that day, Eric Lambert has done well from a cardiac standpoint, no changes or anginal symptoms. Echo showed normal pump function with some mild valvular changes. He is fairly functional at baseline and uses a cane to walk. Function is limited by joint pain. METS>4. Per pharmacy recommendations, okay to hold Eliquis 2 days prior to surgery.   Therefore, based on ACC/AHA guidelines, the patient would be at acceptable risk for the planned procedure without further cardiovascular testing."   VS: BP (!) 92/53   Pulse (!) 58   Temp 36.7 C (Oral)   Resp 16   SpO2 100%   PROVIDERS: Marin Olp, MD is PCP   Virl Axe, MD is Cardiologist  LABS: Labs reviewed: Acceptable for surgery. (all labs ordered are listed, but only abnormal results are displayed)  Labs Reviewed  BASIC METABOLIC PANEL - Abnormal; Notable for the following components:      Result Value   BUN 30 (*)    All other components within normal limits  CBC     IMAGES:   EKG:   CV: Echo 03/04/2021 1. Left ventricular ejection fraction, by estimation, is 55 to 60%. The  left ventricle has normal  function. The left ventricle has no regional  wall motion abnormalities. Left ventricular diastolic parameters are  indeterminate.  2. Right ventricular systolic function is normal. The right ventricular  size is normal. There is normal pulmonary artery systolic pressure. The  estimated right ventricular systolic pressure is 28.4 mmHg.  3. Left atrial size was severely dilated.  4. Right atrial size was mild to moderately dilated.  5. The mitral valve is abnormal with mild bileaflet prolapse. Mild mitral  valve regurgitation. No evidence of mitral stenosis.  6. The aortic valve is tricuspid. Aortic valve regurgitation is not  visualized. Mild aortic valve sclerosis is present, with no evidence of  aortic valve stenosis.  7. The inferior vena cava is normal in size with <50% respiratory  variability, suggesting right atrial pressure of 8 mmHg.  8. The patient was in atrial fibrillation.  Past Medical History:  Diagnosis Date  . Allergy   . Arthritis   . Atrial fibrillation -permanent   . BPH (benign prostatic hyperplasia)   . Cancer (HCC)    HX OF SKIN CANCER   . CHF (congestive heart failure) ([place])    resolved after pacemaker - tachycardia induced  . Complete heart block ([place])   . ED (erectile dysfunction)   . GERD (gastroesophageal reflux disease)   . GLUCOSE INTOLERANCE 10/22/2007   no recent issues  . Heart murmur   . OSA (obstructive sleep apnea)    NO CPAP   . Pacemaker BSX    dual  .  Pneumonia    HX OF SEVERAL TIMES AS A CHILD   . PONV (postoperative nausea and vomiting)    at age 53   . Presence of permanent cardiac pacemaker   . SUBACUTE BACTERIAL ENDOCARDITIS 1970s    Past Surgical History:  Procedure Laterality Date  . INSERT / REPLACE / REMOVE PACEMAKER    . PACEMAKER GENERATOR CHANGE N/A 05/07/2012   Procedure: PACEMAKER GENERATOR CHANGE;  Surgeon: [name], MD;  Location: [facility] CATH LAB;  Service: Cardiovascular;  Laterality: N/A;  . PACEMAKER  PLACEMENT    . PARTIAL HIP ARTHROPLASTY     2008    MEDICATIONS: . acetaminophen (TYLENOL) 500 MG tablet  . apixaban (ELIQUIS) 5 MG TABS tablet  . cholecalciferol (VITAMIN D) 1000 UNITS tablet  . lisinopril (ZESTRIL) 5 MG tablet  . Multiple Vitamins-Minerals (OCUVITE EYE HEALTH FORMULA PO)  . omeprazole (PRILOSEC) 20 MG capsule  . verapamil (CALAN-SR) 240 MG CR tablet   No current facility-administered medications for this encounter.    Konrad Felix, PA-C [facility] Pre-Surgical Testing [phone]

## 2021-04-05 ENCOUNTER — Other Ambulatory Visit (HOSPITAL_COMMUNITY)
Admission: RE | Admit: 2021-04-05 | Discharge: 2021-04-05 | Disposition: A | Discharge status: Home / Self Care | Payer: Medicare HMO | Source: Ambulatory Visit | Attending: General Surgery | Admitting: General Surgery

## 2021-04-05 DIAGNOSIS — Z20822 Contact with and (suspected) exposure to covid-19: Secondary | ICD-10-CM | POA: Insufficient documentation

## 2021-04-05 DIAGNOSIS — Z01812 Encounter for preprocedural laboratory examination: Secondary | ICD-10-CM | POA: Diagnosis not present

## 2021-04-06 LAB — SARS CORONAVIRUS 2 (TAT 6-24 HRS): SARS Coronavirus 2: NEGATIVE

## 2021-04-08 ENCOUNTER — Encounter (HOSPITAL_COMMUNITY)
Admission: RE | Disposition: A | Discharge status: Home / Self Care | Payer: Self-pay | Source: Ambulatory Visit | Attending: General Surgery

## 2021-04-08 ENCOUNTER — Ambulatory Visit (HOSPITAL_COMMUNITY): Payer: Medicare HMO | Admitting: Certified Registered Nurse Anesthetist

## 2021-04-08 ENCOUNTER — Ambulatory Visit (HOSPITAL_COMMUNITY): Payer: Medicare HMO | Admitting: Physician Assistant

## 2021-04-08 ENCOUNTER — Ambulatory Visit (HOSPITAL_COMMUNITY)
Admission: RE | Admit: 2021-04-08 | Discharge: 2021-04-08 | Disposition: A | Discharge status: Home / Self Care | Payer: Medicare HMO | Source: Ambulatory Visit | Attending: General Surgery | Admitting: General Surgery

## 2021-04-08 ENCOUNTER — Encounter (HOSPITAL_COMMUNITY): Payer: Self-pay | Admitting: General Surgery

## 2021-04-08 DIAGNOSIS — K4020 Bilateral inguinal hernia, without obstruction or gangrene, not specified as recurrent: Secondary | ICD-10-CM | POA: Diagnosis not present

## 2021-04-08 DIAGNOSIS — Z950 Presence of cardiac pacemaker: Secondary | ICD-10-CM | POA: Insufficient documentation

## 2021-04-08 DIAGNOSIS — G4733 Obstructive sleep apnea (adult) (pediatric): Secondary | ICD-10-CM | POA: Diagnosis not present

## 2021-04-08 DIAGNOSIS — Z7901 Long term (current) use of anticoagulants: Secondary | ICD-10-CM | POA: Insufficient documentation

## 2021-04-08 DIAGNOSIS — I4821 Permanent atrial fibrillation: Secondary | ICD-10-CM | POA: Insufficient documentation

## 2021-04-08 DIAGNOSIS — K219 Gastro-esophageal reflux disease without esophagitis: Secondary | ICD-10-CM | POA: Diagnosis not present

## 2021-04-08 DIAGNOSIS — Z833 Family history of diabetes mellitus: Secondary | ICD-10-CM | POA: Diagnosis not present

## 2021-04-08 DIAGNOSIS — Z8042 Family history of malignant neoplasm of prostate: Secondary | ICD-10-CM | POA: Insufficient documentation

## 2021-04-08 DIAGNOSIS — Z8249 Family history of ischemic heart disease and other diseases of the circulatory system: Secondary | ICD-10-CM | POA: Insufficient documentation

## 2021-04-08 DIAGNOSIS — Z79899 Other long term (current) drug therapy: Secondary | ICD-10-CM | POA: Diagnosis not present

## 2021-04-08 DIAGNOSIS — D696 Thrombocytopenia, unspecified: Secondary | ICD-10-CM | POA: Diagnosis not present

## 2021-04-08 DIAGNOSIS — I10 Essential (primary) hypertension: Secondary | ICD-10-CM | POA: Insufficient documentation

## 2021-04-08 DIAGNOSIS — Z808 Family history of malignant neoplasm of other organs or systems: Secondary | ICD-10-CM | POA: Insufficient documentation

## 2021-04-08 HISTORY — PX: INGUINAL HERNIA REPAIR: SHX194

## 2021-04-08 SURGERY — REPAIR, HERNIA, INGUINAL, BILATERAL, LAPAROSCOPIC
Anesthesia: General | Laterality: Bilateral

## 2021-04-08 MED ORDER — FENTANYL CITRATE (PF) 100 MCG/2ML IJ SOLN: 25.0000 ug | INTRAMUSCULAR | Status: DC | PRN

## 2021-04-08 MED ORDER — LABETALOL HCL 5 MG/ML IV SOLN
INTRAVENOUS | Status: AC
  Filled 2021-04-08: qty 4

## 2021-04-08 MED ORDER — ORAL CARE MOUTH RINSE: 15.0000 mL | Freq: Once | OROMUCOSAL | Status: AC

## 2021-04-08 MED ORDER — BUPIVACAINE-EPINEPHRINE (PF) 0.25% -1:200000 IJ SOLN
INTRAMUSCULAR | Status: AC
  Filled 2021-04-08: qty 30

## 2021-04-08 MED ORDER — ROCURONIUM BROMIDE 100 MG/10ML IV SOLN
INTRAVENOUS | Status: DC | PRN
  Administered 2021-04-08: 50 mg via INTRAVENOUS

## 2021-04-08 MED ORDER — LIDOCAINE 2% (20 MG/ML) 5 ML SYRINGE
INTRAMUSCULAR | Status: AC
  Filled 2021-04-08: qty 5

## 2021-04-08 MED ORDER — CEFAZOLIN SODIUM-DEXTROSE 2-4 GM/100ML-% IV SOLN
2.0000 g | INTRAVENOUS | Status: AC
  Administered 2021-04-08: 2 g via INTRAVENOUS
  Filled 2021-04-08: qty 100

## 2021-04-08 MED ORDER — CHLORHEXIDINE GLUCONATE 0.12 % MT SOLN
15.0000 mL | Freq: Once | OROMUCOSAL | Status: AC
  Administered 2021-04-08: 15 mL via OROMUCOSAL

## 2021-04-08 MED ORDER — SUGAMMADEX SODIUM 200 MG/2ML IV SOLN
INTRAVENOUS | Status: DC | PRN
  Administered 2021-04-08: 200 mg via INTRAVENOUS

## 2021-04-08 MED ORDER — OXYCODONE HCL 5 MG PO TABS: 5.0000 mg | ORAL_TABLET | Freq: Four times a day (QID) | ORAL | 0 refills | Status: DC | PRN

## 2021-04-08 MED ORDER — BUPIVACAINE LIPOSOME 1.3 % IJ SUSP
20.0000 mL | Freq: Once | INTRAMUSCULAR | Status: AC
  Administered 2021-04-08: 20 mL
  Filled 2021-04-08: qty 20

## 2021-04-08 MED ORDER — DEXAMETHASONE SODIUM PHOSPHATE 10 MG/ML IJ SOLN
INTRAMUSCULAR | Status: DC | PRN
  Administered 2021-04-08: 5 mg via INTRAVENOUS

## 2021-04-08 MED ORDER — FENTANYL CITRATE (PF) 100 MCG/2ML IJ SOLN
INTRAMUSCULAR | Status: AC
  Filled 2021-04-08: qty 2

## 2021-04-08 MED ORDER — LACTATED RINGERS IV SOLN: INTRAVENOUS | Status: DC

## 2021-04-08 MED ORDER — LIDOCAINE HCL (CARDIAC) PF 100 MG/5ML IV SOSY
PREFILLED_SYRINGE | INTRAVENOUS | Status: DC | PRN
  Administered 2021-04-08: 30 mg via INTRAVENOUS

## 2021-04-08 MED ORDER — CHLORHEXIDINE GLUCONATE CLOTH 2 % EX PADS: 6.0000 | MEDICATED_PAD | Freq: Once | CUTANEOUS | Status: DC

## 2021-04-08 MED ORDER — FENTANYL CITRATE (PF) 100 MCG/2ML IJ SOLN
INTRAMUSCULAR | Status: DC | PRN
  Administered 2021-04-08: 50 ug via INTRAVENOUS
  Administered 2021-04-08: 100 ug via INTRAVENOUS

## 2021-04-08 MED ORDER — 0.9 % SODIUM CHLORIDE (POUR BTL) OPTIME
TOPICAL | Status: DC | PRN
  Administered 2021-04-08: 1000 mL

## 2021-04-08 MED ORDER — ONDANSETRON HCL 4 MG/2ML IJ SOLN
INTRAMUSCULAR | Status: DC | PRN
  Administered 2021-04-08: 4 mg via INTRAVENOUS

## 2021-04-08 MED ORDER — ENSURE PRE-SURGERY PO LIQD: 296.0000 mL | Freq: Once | ORAL | Status: DC

## 2021-04-08 MED ORDER — ROCURONIUM BROMIDE 10 MG/ML (PF) SYRINGE
PREFILLED_SYRINGE | INTRAVENOUS | Status: AC
  Filled 2021-04-08: qty 10

## 2021-04-08 MED ORDER — BUPIVACAINE-EPINEPHRINE (PF) 0.25% -1:200000 IJ SOLN
INTRAMUSCULAR | Status: DC | PRN
  Administered 2021-04-08: 30 mL

## 2021-04-08 MED ORDER — PROPOFOL 10 MG/ML IV BOLUS
INTRAVENOUS | Status: DC | PRN
  Administered 2021-04-08: 70 mg via INTRAVENOUS

## 2021-04-08 MED ORDER — ACETAMINOPHEN 500 MG PO TABS
1000.0000 mg | ORAL_TABLET | ORAL | Status: AC
  Administered 2021-04-08: 1000 mg via ORAL
  Filled 2021-04-08: qty 2

## 2021-04-08 MED ORDER — PROPOFOL 10 MG/ML IV BOLUS
INTRAVENOUS | Status: AC
  Filled 2021-04-08: qty 20

## 2021-04-08 SURGICAL SUPPLY — 42 items
ADH SKN CLS APL DERMABOND .7 (GAUZE/BANDAGES/DRESSINGS) ×1
APL PRP STRL LF DISP 70% ISPRP (MISCELLANEOUS) ×1
APL SKNCLS STERI-STRIP NONHPOA (GAUZE/BANDAGES/DRESSINGS) ×1
APPLIER CLIP 5 13 M/L LIGAMAX5 (MISCELLANEOUS)
APR CLP MED LRG 5 ANG JAW (MISCELLANEOUS)
BENZOIN TINCTURE PRP APPL 2/3 (GAUZE/BANDAGES/DRESSINGS) ×2 IMPLANT
BNDG ADH 1X3 SHEER STRL LF (GAUZE/BANDAGES/DRESSINGS) ×6 IMPLANT
BNDG ADH THN 3X1 STRL LF (GAUZE/BANDAGES/DRESSINGS) ×3
CABLE HIGH FREQUENCY MONO STRZ (ELECTRODE) ×2 IMPLANT
CHLORAPREP W/TINT 26 (MISCELLANEOUS) ×2 IMPLANT
CLIP APPLIE 5 13 M/L LIGAMAX5 (MISCELLANEOUS) IMPLANT
COVER SURGICAL LIGHT HANDLE (MISCELLANEOUS) ×2 IMPLANT
COVER WAND RF STERILE (DRAPES) IMPLANT
DECANTER SPIKE VIAL GLASS SM (MISCELLANEOUS) ×2 IMPLANT
DERMABOND ADVANCED (GAUZE/BANDAGES/DRESSINGS) ×1
DERMABOND ADVANCED .7 DNX12 (GAUZE/BANDAGES/DRESSINGS) IMPLANT
DEVICE SECURE STRAP 25 ABSORB (INSTRUMENTS) ×2 IMPLANT
DRAIN CHANNEL 19F RND (DRAIN) IMPLANT
ELECT REM PT RETURN 15FT ADLT (MISCELLANEOUS) ×2 IMPLANT
EVACUATOR SILICONE 100CC (DRAIN) IMPLANT
GLOVE SURG ENC MOIS LTX SZ7 (GLOVE) ×2 IMPLANT
GLOVE SURG POLYISO LF SZ7 (GLOVE) ×2 IMPLANT
GLOVE SURG UNDER POLY LF SZ7 (GLOVE) ×2 IMPLANT
GOWN STRL REUS W/TWL LRG LVL3 (GOWN DISPOSABLE) ×2 IMPLANT
GOWN STRL REUS W/TWL XL LVL3 (GOWN DISPOSABLE) ×4 IMPLANT
KIT BASIN OR (CUSTOM PROCEDURE TRAY) ×2 IMPLANT
KIT TURNOVER KIT A (KITS) ×2 IMPLANT
MESH 3DMAX 4X6 LT LRG (Mesh General) ×1 IMPLANT
MESH 3DMAX 4X6 RT LRG (Mesh General) ×1 IMPLANT
SCISSORS LAP 5X35 DISP (ENDOMECHANICALS) IMPLANT
SET IRRIG TUBING LAPAROSCOPIC (IRRIGATION / IRRIGATOR) IMPLANT
SET TUBE SMOKE EVAC HIGH FLOW (TUBING) ×2 IMPLANT
SLEEVE XCEL OPT CAN 5 100 (ENDOMECHANICALS) ×2 IMPLANT
STRIP CLOSURE SKIN 1/2X4 (GAUZE/BANDAGES/DRESSINGS) ×2 IMPLANT
SUT ETHILON 2 0 PS N (SUTURE) IMPLANT
SUT MNCRL AB 4-0 PS2 18 (SUTURE) ×2 IMPLANT
SUT VICRYL 0 UR6 27IN ABS (SUTURE) IMPLANT
TOWEL OR 17X26 10 PK STRL BLUE (TOWEL DISPOSABLE) ×2 IMPLANT
TRAY FOLEY MTR SLVR 16FR STAT (SET/KITS/TRAYS/PACK) IMPLANT
TRAY LAPAROSCOPIC (CUSTOM PROCEDURE TRAY) ×2 IMPLANT
TROCAR BLADELESS OPT 5 100 (ENDOMECHANICALS) ×2 IMPLANT
TROCAR XCEL 12X100 BLDLESS (ENDOMECHANICALS) ×2 IMPLANT

## 2021-04-08 NOTE — Anesthesia Procedure Notes (Signed)
Procedure Name: Intubation Date/Time: 04/08/2021 9:52 AM Performed by: [place] Nanami Whitelaw C, CRNA Pre-anesthesia Checklist: Patient identified, Emergency Drugs available, Suction available and Patient being monitored Patient Re-evaluated:Patient Re-evaluated prior to induction Oxygen Delivery Method: Circle system utilized Preoxygenation: Pre-oxygenation with 100% oxygen Induction Type: IV induction Ventilation: Mask ventilation without difficulty Laryngoscope Size: Mac and 4 Grade View: Grade II Tube type: Oral Tube size: 7.5 mm Number of attempts: 1 Airway Equipment and Method: Stylet and Oral airway Placement Confirmation: ETT inserted through vocal cords under direct vision,  positive ETCO2 and breath sounds checked- equal and bilateral Secured at: 22 cm Tube secured with: Tape Dental Injury: Teeth and Oropharynx as per pre-operative assessment

## 2021-04-08 NOTE — Anesthesia Postprocedure Evaluation (Signed)
Anesthesia Post Note  Patient: Eric Lambert  Procedure(s) Performed: LAPAROSCOPIC BILATERAL INGUINAL HERNIA REPAIR WITH MESH (Bilateral )     Patient location during evaluation: Phase II Anesthesia Type: General Level of consciousness: awake and alert, patient cooperative and oriented Pain management: pain level controlled Vital Signs Assessment: post-procedure vital signs reviewed and stable Respiratory status: nonlabored ventilation, spontaneous breathing and respiratory function stable Cardiovascular status: blood pressure returned to baseline and stable Postop Assessment: no apparent nausea or vomiting, able to ambulate and adequate PO intake Anesthetic complications: no   No complications documented.  Last Vitals:  Vitals:   04/08/21 1200 04/08/21 1215  BP: 122/63 114/60  Pulse: 66 65  Resp: 16 20  Temp:  (!) 36.4 C  SpO2: 98% 97%    Last Pain:  Vitals:   04/08/21 1215  TempSrc:   PainSc: 4                  Lincy Belles,E. Dolphus Linch

## 2021-04-08 NOTE — Op Note (Signed)
Procedure Performed: 1) totally extraperitoneal inguinal hernia repair (TEP), bilateral, with mesh  Indications: This patient presented with bilateral inguinal herniae with symptoms and requested repair.  Pre-operative Diagnosis: bilateral inguinal herniae  Post-operative Diagnosis: bilateral inguinal herniae, combined direct and indirect defects  Surgeon:  Dr. Gurney Maxin, MD  Assistants: Dr. Dolphus Jenny, MD, PGY4   Anesthesia: General endotracheal anesthesia  ASA Class: 3  Procedure Details  Written informed consent was garnered and the patient was taken to the operating room. He was positioned supine before there was a gentle induction of general anesthesia with endotracheal intubation by the anesthesia provider team. The arms were tucked and a Foley catheter placed by the perioperative nursing team. The abdomen was prepped and draped in the typical sterile fashion using Chlorapreps. A timeout for safety was called and SSI prophylaxis was provided.   A #11 scalpel was used to make an 11 mm incision to the left of the umbilicus. A hemostat was used to bluntly spread the soft tissues and then expose the anterior rectus sheath. This was incised with a #11 scalpel and then an 67mm port was placed into this space and the preperitoneal space insufflated to 54mmHg on the first attempt. This space was bluntly developed using a 8mm 30-degree camera. Two additional working ports (47mm each) were placed under direct endoscopic guidance in the midline just above the pubis and then midway between this lower port and the 25mm camera port. Careful inspection revealed four defects: a large right-sided medial defect (direct inguinal hernia), a moderate right-sided lateral defect (indirect inguinal hernia), a moderate left-sided medial defect (direct), and a large left-sided lateral defect (indirect).   A total of 25 mL of liposomal bupivacaine was injected under direct endoscopic guidance into the muscular  layers of the lateral abdominal wall at the level of the groin bilaterally for analgesia. Starting with the right-sided direct hernia, we used blunt dissection to reduce a thickened hernia sac from its adhesive attachments at the level of the transversalis fascia and returned it to the abdomen.   We turned our attention toward the indirect right-sided hernia. The cord structures were carefully dissected away from the hernia sac and a vas visually identified. The sac was returned to the abdomen.  We performed a similar such exposure and dissection of the left-sided structures, which similarly demonstrated combined indirect and direct defects. There was some generalized adhesive disease on the left side suggesting chronic inflammation or irritation without other significant findings. This made the dissection of the structures somewhat more tedious but ultimately was still safe and completed under direct vision. Direct and indirect inguinal herniae were ultimately reduced.  We introduced a right-sided large-sized 3D MAX hernia mesh and positioned this such that it covered both direct and indirect defects with overlap. Two absorbable tacks were used medially and superiorly to fasten the mesh in place. A left-sided large-sized 3D MAX hernia mesh with then introduced and used to cover both direct and indirect defects with overlap. Two absorbable tacks were used medially and superiorly to fasten the mesh in place.  Satisfied with our mesh placement, hemostasis, reduction of all four herniae, and all other findings as described, we desufflated the preperitoneal space under direct visualization, removed all three ports, and then injected remaining local anesthetic at the umbilical site to provide analgesia. The anterior fascial defect was closed with a #0 absorbable braided Vicryl suture and the skin closed at all port sites with 4-0 absorbable monofilament Monocryl suture. Wounds were dressed with  skin glue. The  patient tolerated the procedure well and was extubated at the conclusion of the case. All instrument, needle, and sponge counts were correct at the conclusion of the procedure.  Findings: substantial bilateral inguinal herniae; both direct and indirect defects at both left and right groins  Estimated Blood Loss: minimal         Drains: none         Specimens: none       Complications: None; patient tolerated the procedure well.         Disposition: PACU - hemodynamically stable.         Condition: stable  Images:  Fastened meshes overlapping medially   Representative image of large right-sided direct hernia   Representative image of moderate right-sided indirect inguinal hernia

## 2021-04-08 NOTE — Transfer of Care (Signed)
Immediate Anesthesia Transfer of Care Note  Patient: Eric Lambert [facility]  Procedure(s) Performed: LAPAROSCOPIC BILATERAL INGUINAL HERNIA REPAIR WITH MESH (Bilateral )  Patient Location: PACU  Anesthesia Type:General  Level of Consciousness: awake, alert  and oriented  Airway & Oxygen Therapy: Patient Spontanous Breathing and Patient connected to face mask oxygen  Post-op Assessment: Report given to RN and Post -op Vital signs reviewed and stable  Post vital signs: Reviewed and stable  Last Vitals:  Vitals Value Taken Time  BP 154/78 04/08/21 1145  Temp    Pulse 76 04/08/21 1147  Resp 14 04/08/21 1147  SpO2 100 % 04/08/21 1147  Vitals shown include unvalidated device data.  Last Pain:  Vitals:   04/08/21 0936  TempSrc: Oral  PainSc:          Complications: No complications documented.

## 2021-04-08 NOTE — H&P (Signed)
Eric Lambert is an 87 y.o. male.   Chief Complaint: inguinal hernias HPI: 87 yo male with bilateral inguinal hernias. His left side has become more bothersome. He presents for repair.  Past Medical History:  Diagnosis Date  . Allergy   . Arthritis   . Atrial fibrillation -permanent   . BPH (benign prostatic hyperplasia)   . Cancer (HCC)    HX OF SKIN CANCER   . CHF (congestive heart failure) ([place])    resolved after pacemaker - tachycardia induced  . Complete heart block ([place])   . ED (erectile dysfunction)   . GERD (gastroesophageal reflux disease)   . GLUCOSE INTOLERANCE 10/22/2007   no recent issues  . Heart murmur   . OSA (obstructive sleep apnea)    NO CPAP   . Pacemaker BSX    dual  . Pneumonia    HX OF SEVERAL TIMES AS A CHILD   . PONV (postoperative nausea and vomiting)    at age 53   . Presence of permanent cardiac pacemaker   . SUBACUTE BACTERIAL ENDOCARDITIS 1970s    Past Surgical History:  Procedure Laterality Date  . INSERT / REPLACE / REMOVE PACEMAKER    . PACEMAKER GENERATOR CHANGE N/A 05/07/2012   Procedure: PACEMAKER GENERATOR CHANGE;  Surgeon: [name], MD;  Location: [facility] CATH LAB;  Service: Cardiovascular;  Laterality: N/A;  . PACEMAKER PLACEMENT    . PARTIAL HIP ARTHROPLASTY     2008    Family History  Problem Relation Age of Onset  . Prostate cancer Father   . Heart disease Other        mothers side men- strokes and heart attacks  . Diabetes Brother   . Prostate cancer Brother   . Brain cancer Brother    Social History:  reports that he has never smoked. He has never used smokeless tobacco. He reports previous alcohol use. He reports that he does not use drugs.  Allergies: No Known Allergies  Medications Prior to Admission  Medication Sig Dispense Refill  . acetaminophen (TYLENOL) 500 MG tablet Take 1,000 mg by mouth every 8 (eight) hours as needed for moderate pain.    Marland Kitchen apixaban (ELIQUIS) 5 MG TABS tablet Take 1 tablet (5 mg  total) by mouth 2 (two) times daily. Please keep upcoming appt. Thanks 60 tablet 1  . cholecalciferol (VITAMIN D) 1000 UNITS tablet Take 1,000 Units by mouth daily.    Marland Kitchen lisinopril (ZESTRIL) 5 MG tablet Take 0.5-1 tablets (2.5-5 mg total) by mouth daily. (Patient taking differently: Take 2.5-5 mg by mouth See admin instructions. Take 2.5 mg by mouth every other day, alternating with 5 mg on alternate days) 90 tablet 3  . Multiple Vitamins-Minerals (OCUVITE EYE HEALTH FORMULA PO) Take 1 tablet by mouth in the morning and at bedtime.    . verapamil (CALAN-SR) 240 MG CR tablet Take 1 tablet (240 mg total) by mouth daily. 90 tablet 0  . omeprazole (PRILOSEC) 20 MG capsule Take 1 capsule (20 mg total) by mouth daily. (Patient not taking: Reported on 03/26/2021) 30 capsule 1    No results found for this or any previous visit (from the past 48 hour(s)). No results found.  Review of Systems  Constitutional: Negative for chills and fever.  HENT: Negative for hearing loss.   Respiratory: Negative for cough.   Cardiovascular: Negative for chest pain and palpitations.  Gastrointestinal: Negative for abdominal pain, nausea and vomiting.  Genitourinary: Negative for dysuria and urgency.  Musculoskeletal:  Negative for myalgias and neck pain.  Skin: Negative for rash.  Neurological: Negative for dizziness and headaches.  Hematological: Does not bruise/bleed easily.  Psychiatric/Behavioral: Negative for suicidal ideas.    There were no vitals taken for this visit. Physical Exam Vitals reviewed.  Constitutional:      Appearance: He is well-developed.  HENT:     Head: Normocephalic and atraumatic.  Eyes:     Conjunctiva/sclera: Conjunctivae normal.     Pupils: Pupils are equal, round, and reactive to light.  Cardiovascular:     Rate and Rhythm: Normal rate and regular rhythm.  Pulmonary:     Effort: Pulmonary effort is normal.     Breath sounds: Normal breath sounds.  Abdominal:     General:  Bowel sounds are normal. There is no distension.     Palpations: Abdomen is soft.     Tenderness: There is no abdominal tenderness.     Comments: Bilateral inguinal hernias  Musculoskeletal:        General: Normal range of motion.     Cervical back: Normal range of motion and neck supple.  Skin:    General: Skin is warm and dry.  Neurological:     Mental Status: He is alert and oriented to person, place, and time.  Psychiatric:        Behavior: Behavior normal.      Assessment/Plan 87 yo male with bilateral inguinal hernias -lap bilateral inguinal hernia repair with mesh as outpatient -planned outpatient procedure  Mickeal Skinner, MD 04/08/2021, 8:49 AM

## 2021-04-08 NOTE — Anesthesia Preprocedure Evaluation (Addendum)
Anesthesia Evaluation  Patient identified by MRN, date of birth, ID band Patient awake    Reviewed: Allergy & Precautions, NPO status , Patient's Chart, lab work & pertinent test results  History of Anesthesia Complications (+) PONV  Airway Mallampati: II  TM Distance: >3 FB Neck ROM: Full    Dental  (+) Caps, Dental Advisory Given   Pulmonary sleep apnea (does not use CPAP) ,  04/05/2021 SARS coronaivrus NEG   breath sounds clear to auscultation       Cardiovascular hypertension, Pt. on medications (-) angina+ dysrhythmias Atrial Fibrillation + pacemaker (for complete heart block) + Valvular Problems/Murmurs MR  Rhythm:Regular Rate:Normal  02/2021 ECHO: EF 55-60%, normal LVF, MVP with bileaflet prolapse and mild MR   Neuro/Psych negative neurological ROS     GI/Hepatic Neg liver ROS, GERD  Medicated and Controlled,  Endo/Other  negative endocrine ROS  Renal/GU negative Renal ROS     Musculoskeletal  (+) Arthritis ,   Abdominal   Peds  Hematology eliquis   Anesthesia Other Findings   Reproductive/Obstetrics                            Anesthesia Physical Anesthesia Plan  ASA: III  Anesthesia Plan: General   Post-op Pain Management:    Induction: Intravenous  PONV Risk Score and Plan: 3 and Ondansetron, Dexamethasone and Treatment may vary due to age or medical condition  Airway Management Planned: Oral ETT  Additional Equipment: None  Intra-op Plan:   Post-operative Plan: Extubation in OR  Informed Consent: I have reviewed the patients History and Physical, chart, labs and discussed the procedure including the risks, benefits and alternatives for the proposed anesthesia with the patient or authorized representative who has indicated his/her understanding and acceptance.     Dental advisory given  Plan Discussed with: CRNA and Surgeon  Anesthesia Plan Comments:         Anesthesia Quick Evaluation

## 2021-04-09 ENCOUNTER — Encounter (HOSPITAL_COMMUNITY): Payer: Self-pay | Admitting: General Surgery

## 2021-04-24 ENCOUNTER — Ambulatory Visit (INDEPENDENT_AMBULATORY_CARE_PROVIDER_SITE_OTHER): Payer: Medicare HMO

## 2021-04-24 DIAGNOSIS — I4821 Permanent atrial fibrillation: Secondary | ICD-10-CM | POA: Diagnosis not present

## 2021-04-24 DIAGNOSIS — I495 Sick sinus syndrome: Secondary | ICD-10-CM

## 2021-04-24 LAB — CUP PACEART REMOTE DEVICE CHECK
Battery Remaining Longevity: 36 mo
Battery Remaining Percentage: 41 %
Brady Statistic RV Percent Paced: 44 %
Date Time Interrogation Session: 20220601033600
Implantable Lead Implant Date: 19960809
Implantable Lead Location: 753860
Implantable Lead Model: 4285
Implantable Lead Serial Number: 209144
Implantable Pulse Generator Implant Date: 20130614
Lead Channel Impedance Value: 816 Ohm
Lead Channel Pacing Threshold Amplitude: 1.1 V
Lead Channel Pacing Threshold Pulse Width: 0.4 ms
Lead Channel Setting Pacing Amplitude: 1.4 V
Lead Channel Setting Pacing Pulse Width: 0.4 ms
Lead Channel Setting Sensing Sensitivity: 2.5 mV
Pulse Gen Serial Number: 115653

## 2021-04-29 ENCOUNTER — Other Ambulatory Visit: Payer: Self-pay | Admitting: Internal Medicine

## 2021-04-29 NOTE — Telephone Encounter (Signed)
Pt's age 87, wt 65.9 kg, SCr 0.93, CrCl 50.19, last ov w/ SK 02/19/21.

## 2021-05-05 ENCOUNTER — Other Ambulatory Visit: Payer: Self-pay | Admitting: Internal Medicine

## 2021-05-17 NOTE — Progress Notes (Signed)
Remote pacemaker transmission.   

## 2021-05-22 DIAGNOSIS — M1612 Unilateral primary osteoarthritis, left hip: Secondary | ICD-10-CM | POA: Diagnosis not present

## 2021-05-22 DIAGNOSIS — M47816 Spondylosis without myelopathy or radiculopathy, lumbar region: Secondary | ICD-10-CM | POA: Diagnosis not present

## 2021-05-30 DIAGNOSIS — M47816 Spondylosis without myelopathy or radiculopathy, lumbar region: Secondary | ICD-10-CM | POA: Diagnosis not present

## 2021-05-30 DIAGNOSIS — M1612 Unilateral primary osteoarthritis, left hip: Secondary | ICD-10-CM | POA: Diagnosis not present

## 2021-05-30 DIAGNOSIS — M5459 Other low back pain: Secondary | ICD-10-CM | POA: Diagnosis not present

## 2021-05-30 DIAGNOSIS — M25652 Stiffness of left hip, not elsewhere classified: Secondary | ICD-10-CM | POA: Diagnosis not present

## 2021-06-03 DIAGNOSIS — M1612 Unilateral primary osteoarthritis, left hip: Secondary | ICD-10-CM | POA: Diagnosis not present

## 2021-06-04 ENCOUNTER — Other Ambulatory Visit: Payer: Self-pay | Admitting: Internal Medicine

## 2021-06-06 DIAGNOSIS — M47816 Spondylosis without myelopathy or radiculopathy, lumbar region: Secondary | ICD-10-CM | POA: Diagnosis not present

## 2021-06-06 DIAGNOSIS — M5459 Other low back pain: Secondary | ICD-10-CM | POA: Diagnosis not present

## 2021-06-06 DIAGNOSIS — M1612 Unilateral primary osteoarthritis, left hip: Secondary | ICD-10-CM | POA: Diagnosis not present

## 2021-06-06 DIAGNOSIS — M25652 Stiffness of left hip, not elsewhere classified: Secondary | ICD-10-CM | POA: Diagnosis not present

## 2021-06-07 ENCOUNTER — Telehealth: Payer: Self-pay | Admitting: Internal Medicine

## 2021-06-07 ENCOUNTER — Telehealth: Payer: Self-pay

## 2021-06-07 MED ORDER — LISINOPRIL 5 MG PO TABS: 2.5000 mg | ORAL_TABLET | ORAL | 3 refills | Status: DC

## 2021-06-07 NOTE — Telephone Encounter (Signed)
Called pt and left detailed message that PCP prescribes Lisinopril so he would need to contact their office for refills and to find out about denial.

## 2021-06-07 NOTE — Telephone Encounter (Signed)
Patient is returning call and requested to speak with Anderson Malta B. in regards to medication questions

## 2021-06-07 NOTE — Telephone Encounter (Signed)
Pt c/o medication issue:  1. Name of Medication:  lisinopril (ZESTRIL) 5 MG tablet  2. How are you currently taking this medication (dosage and times per day)?   3. Are you having a reaction (difficulty breathing--STAT)?   4. What is your medication issue?   Patient states yesterday he went to his local pharmacy ([facility], [place]) for a refill and he was told the medication was denied by the prescriber. He would like to know if he should discontinue it. If not, he would like to know why he was denied as he states he is completely out of medication.

## 2021-06-07 NOTE — Telephone Encounter (Signed)
Refill has been sent.  °

## 2021-06-07 NOTE — Telephone Encounter (Signed)
Spoke with pt and he states his medication bottle has Dr. Olin Pia name.  Advised per Dr. Ansel Bong last note, adjustments were made to dosing, therefore, his office would have to refill it.  Pt will contact PCP office.

## 2021-06-07 NOTE — Telephone Encounter (Signed)
  LAST APPOINTMENT DATE:  02/19/21  NEXT APPOINTMENT DATE:@9 /29/2022  MEDICATION: lisinopril (ZESTRIL) 5 MG tablet  PHARMACY:[facility] [address], [place]  Pt also stated that when he called the pharmacy for the refill, it showed that Dr Yong Channel was not the one that prescribed the medication. He stated that Dr Yong Channel may needs to write a new prescription to help with the confusion at the [facility]

## 2021-06-13 DIAGNOSIS — M25652 Stiffness of left hip, not elsewhere classified: Secondary | ICD-10-CM | POA: Diagnosis not present

## 2021-06-13 DIAGNOSIS — M47816 Spondylosis without myelopathy or radiculopathy, lumbar region: Secondary | ICD-10-CM | POA: Diagnosis not present

## 2021-06-13 DIAGNOSIS — M1612 Unilateral primary osteoarthritis, left hip: Secondary | ICD-10-CM | POA: Diagnosis not present

## 2021-06-13 DIAGNOSIS — M5459 Other low back pain: Secondary | ICD-10-CM | POA: Diagnosis not present

## 2021-06-19 DIAGNOSIS — M5459 Other low back pain: Secondary | ICD-10-CM | POA: Diagnosis not present

## 2021-06-19 DIAGNOSIS — M47816 Spondylosis without myelopathy or radiculopathy, lumbar region: Secondary | ICD-10-CM | POA: Diagnosis not present

## 2021-06-19 DIAGNOSIS — M1612 Unilateral primary osteoarthritis, left hip: Secondary | ICD-10-CM | POA: Diagnosis not present

## 2021-06-19 DIAGNOSIS — M25652 Stiffness of left hip, not elsewhere classified: Secondary | ICD-10-CM | POA: Diagnosis not present

## 2021-06-27 DIAGNOSIS — M5459 Other low back pain: Secondary | ICD-10-CM | POA: Diagnosis not present

## 2021-06-27 DIAGNOSIS — M47816 Spondylosis without myelopathy or radiculopathy, lumbar region: Secondary | ICD-10-CM | POA: Diagnosis not present

## 2021-06-27 DIAGNOSIS — M1612 Unilateral primary osteoarthritis, left hip: Secondary | ICD-10-CM | POA: Diagnosis not present

## 2021-06-27 DIAGNOSIS — M25652 Stiffness of left hip, not elsewhere classified: Secondary | ICD-10-CM | POA: Diagnosis not present

## 2021-07-02 DIAGNOSIS — M1612 Unilateral primary osteoarthritis, left hip: Secondary | ICD-10-CM | POA: Diagnosis not present

## 2021-07-02 DIAGNOSIS — M47816 Spondylosis without myelopathy or radiculopathy, lumbar region: Secondary | ICD-10-CM | POA: Diagnosis not present

## 2021-07-05 DIAGNOSIS — M461 Sacroiliitis, not elsewhere classified: Secondary | ICD-10-CM | POA: Diagnosis not present

## 2021-07-05 DIAGNOSIS — M1612 Unilateral primary osteoarthritis, left hip: Secondary | ICD-10-CM | POA: Diagnosis not present

## 2021-07-08 DIAGNOSIS — M1612 Unilateral primary osteoarthritis, left hip: Secondary | ICD-10-CM | POA: Diagnosis not present

## 2021-07-15 ENCOUNTER — Telehealth: Payer: Self-pay

## 2021-07-15 NOTE — Telephone Encounter (Signed)
ERROR

## 2021-07-18 ENCOUNTER — Encounter: Payer: Self-pay | Admitting: Physician Assistant

## 2021-07-18 ENCOUNTER — Ambulatory Visit (INDEPENDENT_AMBULATORY_CARE_PROVIDER_SITE_OTHER)
Admission: RE | Admit: 2021-07-18 | Discharge: 2021-07-18 | Disposition: A | Discharge status: Home / Self Care | Payer: Medicare HMO | Source: Ambulatory Visit | Attending: Physician Assistant | Admitting: Physician Assistant

## 2021-07-18 ENCOUNTER — Other Ambulatory Visit: Payer: Self-pay

## 2021-07-18 ENCOUNTER — Ambulatory Visit (INDEPENDENT_AMBULATORY_CARE_PROVIDER_SITE_OTHER): Payer: Medicare HMO | Admitting: Physician Assistant

## 2021-07-18 VITALS — BP 101/60 | HR 76 | Temp 98.1°F | Wt 142.2 lb

## 2021-07-18 DIAGNOSIS — R634 Abnormal weight loss: Secondary | ICD-10-CM

## 2021-07-18 DIAGNOSIS — Z7901 Long term (current) use of anticoagulants: Secondary | ICD-10-CM

## 2021-07-18 DIAGNOSIS — I4891 Unspecified atrial fibrillation: Secondary | ICD-10-CM | POA: Diagnosis not present

## 2021-07-18 LAB — COMPREHENSIVE METABOLIC PANEL
ALT: 10 U/L (ref 0–53)
AST: 12 U/L (ref 0–37)
Albumin: 3.7 g/dL (ref 3.5–5.2)
Alkaline Phosphatase: 61 U/L (ref 39–117)
BUN: 41 mg/dL — ABNORMAL HIGH (ref 6–23)
CO2: 26 mEq/L (ref 19–32)
Calcium: 9.3 mg/dL (ref 8.4–10.5)
Chloride: 103 mEq/L (ref 96–112)
Creatinine, Ser: 1.07 mg/dL (ref 0.40–1.50)
GFR: 61.28 mL/min (ref >60.00)
Glucose, Bld: 145 mg/dL — ABNORMAL HIGH (ref 70–99)
Potassium: 4.3 mEq/L (ref 3.5–5.1)
Sodium: 137 mEq/L (ref 135–145)
Total Bilirubin: 0.7 mg/dL (ref 0.2–1.2)
Total Protein: 6.3 g/dL (ref 6.0–8.3)

## 2021-07-18 LAB — CBC WITH DIFFERENTIAL/PLATELET
Basophils Absolute: 0 10*3/uL (ref 0.0–0.1)
Basophils Relative: 0.3 % (ref 0.0–3.0)
Eosinophils Absolute: 0.1 10*3/uL (ref 0.0–0.7)
Eosinophils Relative: 0.7 % (ref 0.0–5.0)
HCT: 41.8 % (ref 39.0–52.0)
Hemoglobin: 13.9 g/dL (ref 13.0–17.0)
Lymphocytes Relative: 11.6 % — ABNORMAL LOW (ref 12.0–46.0)
Lymphs Abs: 0.9 10*3/uL (ref 0.7–4.0)
MCHC: 33.2 g/dL (ref 30.0–36.0)
MCV: 91.9 fl (ref 78.0–100.0)
Monocytes Absolute: 0.5 10*3/uL (ref 0.1–1.0)
Monocytes Relative: 6.8 % (ref 3.0–12.0)
Neutro Abs: 6.1 10*3/uL (ref 1.4–7.7)
Neutrophils Relative %: 80.6 % — ABNORMAL HIGH (ref 43.0–77.0)
Platelets: 158 10*3/uL (ref 150.0–400.0)
RBC: 4.55 Mil/uL (ref 4.22–5.81)
RDW: 14.2 % (ref 11.5–15.5)
WBC: 7.6 10*3/uL (ref 4.0–10.5)

## 2021-07-18 LAB — POCT URINALYSIS DIPSTICK
Bilirubin, UA: POSITIVE
Blood, UA: NEGATIVE
Glucose, UA: NEGATIVE
Ketones, UA: NEGATIVE
Leukocytes, UA: NEGATIVE
Nitrite, UA: NEGATIVE
Protein, UA: POSITIVE — AB
Spec Grav, UA: >=1.03 — AB (ref 1.010–1.025)
Urobilinogen, UA: 1 E.U./dL
pH, UA: 5 (ref 5.0–8.0)

## 2021-07-18 LAB — TSH: TSH: 1.95 u[IU]/mL (ref 0.35–5.50)

## 2021-07-18 LAB — C-REACTIVE PROTEIN: CRP: <1 mg/dL (ref 0.5–20.0)

## 2021-07-18 NOTE — Patient Instructions (Addendum)
Good to meet you today! Please go to the lab for blood work and I will send results through [place]. You will also need to have a CXR done at [facility].  Keep f/up with Dr. Yong Channel as scheduled next month.

## 2021-07-18 NOTE — Progress Notes (Signed)
Acute Office Visit  Subjective:    Patient ID: Eric Lambert, male    DOB: 1929-10-25, 87 y.o.   MRN: [id number]  Chief Complaint  Patient presents with   Weight Loss    151 on 3/29, 142.2 today     HPI Patient is in today for unintentional weight loss.  Wt Readings from Last 3 Encounters:  07/18/21 142 lb 3.2 oz (64.5 kg)  04/08/21 145 lb 3.2 oz (65.9 kg)  02/19/21 153 lb 6.4 oz (69.6 kg)   He is retired. Lives in his own home with his wife. He has a garden he enjoys. No additional exercise outside of yard work. Once weekly PT for general aches & pains.   Breakfast: One egg, bacon, toast, milk. Lunch: Sandwich, milk, sometimes chips Dinner: Wife cooks meals, usually the "heavier" meal.  Sleep - 11 pm to 6 am. No night sweats or chills at night.    No changes with his medications.  No changes in his appetite.  He denies any depression, stating that he has had a rift with his son for the last 15 years, but says it is what it is in that situation.   Past Medical History:  Diagnosis Date   Allergy    Arthritis    Atrial fibrillation -permanent    BPH (benign prostatic hyperplasia)    Cancer (HCC)    HX OF SKIN CANCER    CHF (congestive heart failure) ([place])    resolved after pacemaker - tachycardia induced   Complete heart block (HCC)    ED (erectile dysfunction)    GERD (gastroesophageal reflux disease)    GLUCOSE INTOLERANCE 10/22/2007   no recent issues   Heart murmur    OSA (obstructive sleep apnea)    NO CPAP    Pacemaker BSX    dual   Pneumonia    HX OF SEVERAL TIMES AS A CHILD    PONV (postoperative nausea and vomiting)    at age 5    Presence of permanent cardiac pacemaker    SUBACUTE BACTERIAL ENDOCARDITIS 1970s    Past Surgical History:  Procedure Laterality Date   INGUINAL HERNIA REPAIR Bilateral 04/08/2021   Procedure: LAPAROSCOPIC BILATERAL INGUINAL HERNIA REPAIR WITH MESH;  Surgeon: Kinsinger, Arta Bruce, MD;  Location: [facility];   Service: General;  Laterality: Bilateral;   INSERT / REPLACE / REMOVE PACEMAKER     PACEMAKER GENERATOR CHANGE N/A 05/07/2012   Procedure: PACEMAKER GENERATOR CHANGE;  Surgeon: [name], MD;  Location: [facility] CATH LAB;  Service: Cardiovascular;  Laterality: N/A;   PACEMAKER PLACEMENT     PARTIAL HIP ARTHROPLASTY     2008    Family History  Problem Relation Age of Onset   Prostate cancer Father    Heart disease Other        mothers side men- strokes and heart attacks   Diabetes Brother    Prostate cancer Brother    Brain cancer Brother     Social History   Socioeconomic History   Marital status: Married    Spouse name: Not on file   Number of children: 2   Years of education: Not on file   Highest education level: Not on file  Occupational History   Occupation: Training and development officer: RETIRED    Comment: Working part time  Tobacco Use   Smoking status: Never   Smokeless tobacco: Never  Vaping Use   Vaping Use: Never used  Substance and Sexual Activity  Alcohol use: Not Currently    Comment: hx of 10-15 years ago    Drug use: No   Sexual activity: Not on file  Other Topics Concern   Not on file  Social History Narrative   Married. 2 children. 1 grandkid. 1 greatgrandchild.       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, plays bridge on internet, Neurosurgeon, enjoys talking politics   Social Determinants of Radio broadcast assistant Strain: Not on [employer] Insecurity: Not on file  Transportation Needs: Not on file  Physical Activity: Not on file  Stress: Not on file  Social Connections: Not on file  Intimate Partner Violence: Not on file    Outpatient Medications Prior to Visit  Medication Sig Dispense Refill   acetaminophen (TYLENOL) 500 MG tablet Take 1,000 mg by mouth every 8 (eight) hours as needed for moderate pain.     apixaban (ELIQUIS) 5 MG TABS tablet Take 1 tablet (5 mg total) by mouth 2 (two) times  daily. 60 tablet 1   cholecalciferol (VITAMIN D) 1000 UNITS tablet Take 1,000 Units by mouth daily.     lisinopril (ZESTRIL) 5 MG tablet Take 0.5-1 tablets (2.5-5 mg total) by mouth See admin instructions. Take 2.5 mg by mouth every other day, alternating with 5 mg on alternate days 90 tablet 3   Multiple Vitamins-Minerals (OCUVITE EYE HEALTH FORMULA PO) Take 1 tablet by mouth in the morning and at bedtime.     oxyCODONE (OXY IR/ROXICODONE) 5 MG immediate release tablet Take 1 tablet (5 mg total) by mouth every 6 (six) hours as needed for severe pain. 10 tablet 0   verapamil (CALAN-SR) 240 MG CR tablet TAKE 1 TABLET BY MOUTH ONCE DAILY * KEEP UPCOMING APPOINTMENT* 90 tablet 3   No facility-administered medications prior to visit.    No Known Allergies  Review of Systems  Constitutional:  Positive for unexpected weight change. Negative for activity change, appetite change, chills, fatigue and fever.  Respiratory:  Negative for shortness of breath.   Cardiovascular:  Negative for chest pain.  Gastrointestinal:  Negative for abdominal pain, blood in stool, constipation and diarrhea.  Genitourinary:  Negative for flank pain and hematuria.  Musculoskeletal:  Positive for arthralgias.  Neurological:  Negative for dizziness and headaches.  Hematological:  Negative for adenopathy. Does not bruise/bleed easily.  Psychiatric/Behavioral:  Negative for confusion, dysphoric mood, sleep disturbance and suicidal ideas. The patient is not nervous/anxious.       Objective:    Physical Exam Vitals and nursing note reviewed.  Constitutional:      General: He is not in acute distress.    Appearance: Normal appearance. He is not toxic-appearing.     Comments: thin  HENT:     Head: Normocephalic and atraumatic.     Right Ear: Ear canal and external ear normal.     Left Ear: Ear canal and external ear normal.     Nose: Nose normal.     Mouth/Throat:     Mouth: Mucous membranes are moist.     Pharynx:  Oropharynx is clear.  Eyes:     Extraocular Movements: Extraocular movements intact.     Conjunctiva/sclera: Conjunctivae normal.     Pupils: Pupils are equal, round, and reactive to light.  Neck:     Thyroid: No thyroid mass, thyromegaly or thyroid tenderness.  Cardiovascular:     Rate and Rhythm: Normal rate and  regular rhythm.     Pulses: Normal pulses.     Heart sounds: Normal heart sounds.  Pulmonary:     Effort: Pulmonary effort is normal.     Breath sounds: Normal breath sounds.  Abdominal:     General: Abdomen is flat. Bowel sounds are normal. There is no distension.     Palpations: Abdomen is soft. There is no mass.  Musculoskeletal:        General: Normal range of motion.     Cervical back: Normal range of motion and neck supple.  Skin:    General: Skin is warm and dry.  Neurological:     General: No focal deficit present.     Mental Status: He is alert and oriented to person, place, and time.  Psychiatric:        Mood and Affect: Mood normal.        Behavior: Behavior normal.    BP 101/60   Pulse 76   Temp 98.1 F (36.7 C) (Temporal)   Wt 142 lb 3.2 oz (64.5 kg)   SpO2 98%   BMI 21.00 kg/m  Wt Readings from Last 3 Encounters:  07/18/21 142 lb 3.2 oz (64.5 kg)  04/08/21 145 lb 3.2 oz (65.9 kg)  02/19/21 153 lb 6.4 oz (69.6 kg)    Health Maintenance Due  Topic Date Due   Zoster Vaccines- Shingrix (1 of 2) Never done   COVID-19 Vaccine (4 - Booster) 12/16/2020   INFLUENZA VACCINE  06/24/2021    There are no preventive care reminders to display for this patient.   Lab Results  Component Value Date   TSH 3.71 05/25/2015   Lab Results  Component Value Date   WBC 4.9 04/01/2021   HGB 14.1 04/01/2021   HCT 43.3 04/01/2021   MCV 94.1 04/01/2021   PLT 167 04/01/2021   Lab Results  Component Value Date   NA 138 04/01/2021   K 4.2 04/01/2021   CO2 28 04/01/2021   GLUCOSE 99 04/01/2021   BUN 30 (H) 04/01/2021   CREATININE 0.93 04/01/2021    BILITOT 0.7 02/19/2021   ALKPHOS 74 02/19/2021   AST 12 02/19/2021   ALT 10 02/19/2021   PROT 6.4 02/19/2021   ALBUMIN 4.0 02/19/2021   CALCIUM 9.1 04/01/2021   ANIONGAP 8 04/01/2021   GFR 69.14 02/19/2021   Lab Results  Component Value Date   CHOL 160 08/13/2020   Lab Results  Component Value Date   HDL 77 08/13/2020   Lab Results  Component Value Date   LDLCALC 70 08/13/2020   Lab Results  Component Value Date   TRIG 53 08/13/2020   Lab Results  Component Value Date   CHOLHDL 2.1 08/13/2020   Lab Results  Component Value Date   HGBA1C 4.9 10/22/2007       Assessment & Plan:   Problem List Items Addressed This Visit   None Visit Diagnoses     Unintentional weight loss of less than 10% body weight within 6 months    -  Primary   Relevant Orders   CBC with Differential/Platelet   Comprehensive metabolic panel   TSH   C-reactive protein   HIV Antibody (routine testing w rflx)   Hepatitis C Antibody   POCT Occult Blood Stool   DG Chest 2 View   POCT urinalysis dipstick      1. Unintentional weight loss of less than 10% body weight within 6 months -No obvious source identified today -Approx 7.5%  loss from March 2022 -We will start blood work protocol for unintentional weight loss -Also need to obtain chest x-ray and occult blood stool testing -PHQ-9 score of 2, therefore doubtful of any depression causation -Pending normal labs and chest x-ray, then we will need to consider CT of the chest abdomen and pelvis for further work-up.  Total time spent with the patient today including H&P, review of previous labs and images, documentation, and plan moving forward was 36 minutes.  Draedyn Weidinger M Tinamarie Przybylski, PA-C

## 2021-07-19 ENCOUNTER — Other Ambulatory Visit (INDEPENDENT_AMBULATORY_CARE_PROVIDER_SITE_OTHER): Payer: Medicare HMO

## 2021-07-19 DIAGNOSIS — R634 Abnormal weight loss: Secondary | ICD-10-CM | POA: Diagnosis not present

## 2021-07-19 LAB — HEPATITIS C ANTIBODY
Hepatitis C Ab: NONREACTIVE
SIGNAL TO CUT-OFF: 0 (ref <1.00)

## 2021-07-19 LAB — HEMOGLOBIN A1C: Hgb A1c MFr Bld: 5.8 % (ref 4.6–6.5)

## 2021-07-19 LAB — HIV ANTIBODY (ROUTINE TESTING W REFLEX): HIV 1&2 Ab, 4th Generation: NONREACTIVE

## 2021-07-19 NOTE — Addendum Note (Signed)
Addended by: Loura Back on: 07/19/2021 10:16 AM   Modules accepted: Orders

## 2021-07-22 ENCOUNTER — Telehealth: Payer: Self-pay

## 2021-07-22 NOTE — Telephone Encounter (Signed)
Called and lm on pt confidential vm for him to complete the stool sample.

## 2021-07-22 NOTE — Telephone Encounter (Signed)
Patient called in wondering if he needs to still do the stool sample or wait until he comes in to see Dr.Hunter.

## 2021-07-24 ENCOUNTER — Telehealth: Payer: Self-pay

## 2021-07-24 ENCOUNTER — Ambulatory Visit (INDEPENDENT_AMBULATORY_CARE_PROVIDER_SITE_OTHER): Payer: Medicare HMO

## 2021-07-24 DIAGNOSIS — I429 Cardiomyopathy, unspecified: Secondary | ICD-10-CM

## 2021-07-24 LAB — FECAL OCCULT BLOOD, IMMUNOCHEMICAL: Fecal Occult Bld: NEGATIVE

## 2021-07-24 NOTE — Telephone Encounter (Signed)
Patient called in stating that someone called and left a VM on his phone, I explained to the patient I wasn't sure who called him yesterday the last documentation of someone calling him was on Monday in regards to the stool cards that he was supposed to do. He advised me that he did do his stool cards an brought them back. I gave him a verbal understanding and advised him that that was the only thing that I could see that some one would have called him for. He gave a verbal understanding and asked about lab results, I advised him that he didn't have any current lab results for me to give him he gave a verbal understanding. We disconnected the call.

## 2021-07-26 ENCOUNTER — Telehealth: Payer: Self-pay

## 2021-07-26 NOTE — Telephone Encounter (Signed)
See result notes. 

## 2021-07-26 NOTE — Telephone Encounter (Signed)
Pt called for lab results 

## 2021-07-30 ENCOUNTER — Telehealth: Payer: Self-pay

## 2021-07-30 DIAGNOSIS — L821 Other seborrheic keratosis: Secondary | ICD-10-CM | POA: Diagnosis not present

## 2021-07-30 DIAGNOSIS — L570 Actinic keratosis: Secondary | ICD-10-CM | POA: Diagnosis not present

## 2021-07-30 DIAGNOSIS — Z85828 Personal history of other malignant neoplasm of skin: Secondary | ICD-10-CM | POA: Diagnosis not present

## 2021-07-30 LAB — CUP PACEART REMOTE DEVICE CHECK
Battery Remaining Longevity: 30 mo
Battery Remaining Percentage: 35 %
Brady Statistic RV Percent Paced: 48 %
Date Time Interrogation Session: 20220831031100
Implantable Lead Implant Date: 19960809
Implantable Lead Location: 753860
Implantable Lead Model: 4285
Implantable Lead Serial Number: 209144
Implantable Pulse Generator Implant Date: 20130614
Lead Channel Impedance Value: 871 Ohm
Lead Channel Pacing Threshold Amplitude: 0.8 V
Lead Channel Pacing Threshold Pulse Width: 0.4 ms
Lead Channel Setting Pacing Amplitude: 1.3 V
Lead Channel Setting Pacing Pulse Width: 0.4 ms
Lead Channel Setting Sensing Sensitivity: 2.5 mV
Pulse Gen Serial Number: 115653

## 2021-07-30 NOTE — Telephone Encounter (Signed)
LM for pt tcb. 

## 2021-07-30 NOTE — Telephone Encounter (Signed)
Have any visit slots become available to move him up? Can we use same day  The main issue I see on labs is dehydration- I would encourage him to increase his hydration- other than that honestly rather reassuring- we can review them all together at visit

## 2021-07-30 NOTE — Telephone Encounter (Signed)
Pt called stating that he is getting fustrated that no one has called him about his lab results. I informed pt that I can send a message back to Dr [facility] team and a nurse will give him a call. Pt then stated that he is confused on what is going on with him. Please Advise.

## 2021-07-31 NOTE — Telephone Encounter (Signed)
Yes that's fine 

## 2021-07-31 NOTE — Telephone Encounter (Signed)
See below, can we move pt up to discuss?

## 2021-07-31 NOTE — Telephone Encounter (Signed)
There is a same day slot for 9/19. Can we use it if ok with pt?

## 2021-07-31 NOTE — Telephone Encounter (Signed)
LVM to call back to get scheduled.

## 2021-07-31 NOTE — Progress Notes (Signed)
Resulted

## 2021-08-01 NOTE — Telephone Encounter (Signed)
Pt is scheduled for 9/19.

## 2021-08-03 ENCOUNTER — Other Ambulatory Visit: Payer: Self-pay | Admitting: Internal Medicine

## 2021-08-05 DIAGNOSIS — M47816 Spondylosis without myelopathy or radiculopathy, lumbar region: Secondary | ICD-10-CM | POA: Diagnosis not present

## 2021-08-05 DIAGNOSIS — M1612 Unilateral primary osteoarthritis, left hip: Secondary | ICD-10-CM | POA: Diagnosis not present

## 2021-08-05 DIAGNOSIS — M461 Sacroiliitis, not elsewhere classified: Secondary | ICD-10-CM | POA: Diagnosis not present

## 2021-08-05 NOTE — Telephone Encounter (Signed)
Prescription refill request for Eliquis received. Indication: Afib  Last office visit: 02/19/21 Caryl Comes)  Scr: 1.07 (07/18/21) Age: 87 Weight: 64.5kg  Appropriate dose and refill sent to requested pharmacy.

## 2021-08-06 NOTE — Progress Notes (Signed)
Remote pacemaker transmission.   

## 2021-08-12 ENCOUNTER — Other Ambulatory Visit: Payer: Self-pay

## 2021-08-12 ENCOUNTER — Encounter: Payer: Self-pay | Admitting: Family Medicine

## 2021-08-12 ENCOUNTER — Ambulatory Visit (INDEPENDENT_AMBULATORY_CARE_PROVIDER_SITE_OTHER): Payer: Medicare HMO | Admitting: Family Medicine

## 2021-08-12 VITALS — BP 117/58 | HR 63 | Temp 97.7°F | Ht 69.0 in | Wt 146.2 lb

## 2021-08-12 DIAGNOSIS — M25561 Pain in right knee: Secondary | ICD-10-CM | POA: Diagnosis not present

## 2021-08-12 DIAGNOSIS — I4891 Unspecified atrial fibrillation: Secondary | ICD-10-CM

## 2021-08-12 DIAGNOSIS — E785 Hyperlipidemia, unspecified: Secondary | ICD-10-CM | POA: Diagnosis not present

## 2021-08-12 DIAGNOSIS — R634 Abnormal weight loss: Secondary | ICD-10-CM

## 2021-08-12 DIAGNOSIS — I10 Essential (primary) hypertension: Secondary | ICD-10-CM | POA: Diagnosis not present

## 2021-08-12 DIAGNOSIS — Z23 Encounter for immunization: Secondary | ICD-10-CM | POA: Diagnosis not present

## 2021-08-12 DIAGNOSIS — D696 Thrombocytopenia, unspecified: Secondary | ICD-10-CM

## 2021-08-12 DIAGNOSIS — I700 Atherosclerosis of aorta: Secondary | ICD-10-CM | POA: Diagnosis not present

## 2021-08-12 MED ORDER — LISINOPRIL 2.5 MG PO TABS: 2.5000 mg | ORAL_TABLET | Freq: Every day | ORAL | 3 refills | Status: DC

## 2021-08-12 NOTE — Progress Notes (Signed)
Phone [phone] In person visit   Subjective:   Eric Lambert is a 87 y.o. year old very pleasant male patient who presents for/with See problem oriented charting Chief Complaint  Patient presents with   unintentional weight loss    142.3 on 07/18/21, advised to add boost/ensure inbetween meals   Weight Loss    This visit occurred during the SARS-CoV-2 public health emergency.  Safety protocols were in place, including screening questions prior to the visit, additional usage of staff PPE, and extensive cleaning of exam room while observing appropriate contact time as indicated for disinfecting solutions.   Past Medical History-  Patient Active Problem List   Diagnosis Date Noted   PPM-[employer] 02/07/2009    Priority: High   ATRIAL FIBRILLATION 10/21/2007    Priority: High   BPPV (benign paroxysmal positional vertigo) 12/10/2017    Priority: Medium   Bradycardia 02/02/2013    Priority: Medium   Thrombocytopenia ([place]) 08/04/2011    Priority: Medium   Obstructive sleep apnea 10/21/2007    Priority: Medium   Essential hypertension 10/21/2007    Priority: Medium   GERD 10/21/2007    Priority: Medium   BPH associated with nocturia 10/21/2007    Priority: Medium   Erectile dysfunction 05/28/2016    Priority: Low   Macular degeneration 05/28/2016    Priority: Low   Right hip pain 08/04/2013    Priority: Low   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      cholecalciferol (VITAMIN D) 1000 UNITS tablet Take 1,000 Units by mouth daily.     ELIQUIS 5 MG TABS tablet Take 1 tablet by mouth twice daily 60 tablet 0   Multiple Vitamins-Minerals (OCUVITE EYE HEALTH FORMULA PO) Take 1 tablet by mouth in the morning and at bedtime.     verapamil (CALAN-SR) 240 MG CR tablet TAKE 1 TABLET BY MOUTH ONCE DAILY * KEEP UPCOMING APPOINTMENT* 90 tablet 3   lisinopril (ZESTRIL) 2.5 MG tablet Take 1 tablet (2.5 mg total) by mouth daily. Do not fill until patient calls please. 90 tablet 3   oxyCODONE (OXY IR/ROXICODONE) 5 MG immediate release tablet Take 1 tablet (5 mg total) by mouth every 6 (six) hours as needed for severe pain. (Patient not taking: Reported on 08/12/2021) 10 tablet 0   No current facility-administered medications for this visit.     Objective:  BP (!) 117/58   Pulse 63   Temp 97.7 F (36.5 C)   Ht '5\' 9"'$  (1.753 m)   Wt 146 lb 3.2 oz (66.3 kg)   SpO2 98%   BMI 21.59 kg/m  Gen: NAD, resting comfortably CV: irregularly irregular no murmurs rubs or gallops Lungs: CTAB no crackles, wheeze, rhonchi Abdomen: soft/nontender/nondistended/normal bowel sounds. No rebound or guarding.  Ext: trace edema Skin: warm, dry Neuro: walks with cane    Assessment and Plan   #HM- see avs  # unintentional weight loss S:Office visit on 07/18/2021 with [name], PA-C for evaluation of unintentional weight  loss. He enjoyed garden work and doesn't do any additional exercise outside of yard work. Had PT for general aches and pains once weekly. No change in medications or appetite. Denied any depression. Wife cooked meals, usually the "heavier" meals.  -labs largely reassuring -PHQ-9 score of 2, therefore doubtful of any depression causation -Pending normal labs and chest x-ray, then we will need to consider CT of the chest abdomen and pelvis for further work-up. -prediabetes noted with a1c of 5.8  -never smoker so no lung cancer screening noted -last  PSA check with urology- stopped at age 66 -dehydration based on BUN/cr ratio worsening   DG CHEST VIEW:  FINDINGS: The heart size and mediastinal contours are within normal limits. Both lungs are clear. Left-sided pacemaker is unchanged in position. The visualized skeletal structures are unremarkable.   IMPRESSION: No active cardiopulmonary disease.  Today, Weight up 4 lbs from last visit with Alyssa. Down 5 lbs from our last in office check  in march with each other and 10 lbs from last year in november. He states still eating cookies, ice cream and 3 meals a day. Not exercising anymore - we discussed prior D/c checks of PSA per urology at 88. Dad had prostate cancer. Patient has not had any change in urinary symptoms despite known nocturia.  - he does admit to some dental pain which could potentially affect eating- after further reflection -doing boost about once a day and seems to be helping Wt Readings from Last 3 Encounters:  08/12/21 146 lb 3.2 oz (66.3 kg)  07/18/21 142 lb 3.2 oz (64.5 kg)  04/08/21 145 lb 3.2 oz (65.9 kg)  A/P: 87 year old male with unintentional weight loss over the last year-thankfully this has slowed down/reversed with adding boost at least once a day-should continue this or twice a day.  I wonder if dental pain could have subconsciously been contributing to reduction in food although he did not directly note any reduction in food.  Initial work-up was very reassuring-we discussed if he had continued to lose weight would likely pursue CT abdomen pelvis.  We also discussed possible prostate cancer screening and in the end opted out-has previously seen urology until age 64 and I recommended no further screening and has had no worsening of his urinary symptoms.  I did ask if he continues to lose weight that he let us know-otherwise we will plan on a visit in3-4 months  -prefer boost or ensure over simply adding more cookies/ice cream (he has bene trying more of the  latter plus one boost/ensure) - in retrospect did have some stress with wife in hospital and getting autoimmune work up/diagnosis and stress with son- possibly this was multifactorial- thrilled it has stabilized/improved   # Atrial fibrillation with history of secondary cardiomyopathy-Dr. Olin Pia notes have mention tachycardia induced related atrial fibrillation-did not appear to be an issue with patient being rate controlled.  Continued to follow Dr. Hortencia Pilar with pacemaker for symptomatic bradycardia  S: Rate controlled with diltiazem 240 mg (discontinued)  Anticoagulated with Eliquis 5 mg twice daily   Office visit with Dr.Klein 02/19/2021:  -AFIB - on anticoagulation without bleeding -device function normal -His risk is clearly elevated for knee replacement--at his age alone.  His functional status is hard to assess but his limitations are orthopedic  No known CAD but will get echo as has hx of cardiomyopathy with normalization of LVEF. Echo was normal per notes 03/20/21 by Dr. Caryl Comes A/P: A. Fib. overall stable- continue current meds.  Pacemaker in place. Has follow up next march with Dr. Caryl Comes   #thrombocytopenia- intermittent issues over years- not noted last check- updated 03/22 - CBC was normal. Last noted 2020 but does tend to run lower side of normal A/P: no recent issues- continue to monitor  #hypertension S: medication: Lisinopril 2.5 mg  (had to go down from 2.5 alternating with 5 mg every other day)  as well as verapamil 240 mg daily-though primarily for A. fib BP Readings from Last 3 Encounters:  08/12/21 (!) 117/58  07/18/21 101/60  04/08/21 139/83  A/P: reasonable control- continue current meds  #hyperlipidemia #Aortic atherosclerosis S: Medication:none  Lab Results  Component Value Date   CHOL 160 08/13/2020   HDL 77 08/13/2020   LDLCALC 70 08/13/2020   TRIG 53 08/13/2020   CHOLHDL 2.1 08/13/2020   A/P: LDL was at goal last check without statin with LDL right at  70-will be due for repeat in another 2 days  Recommended follow up: 3-4 month follow up or if doing really well can just schedule yearly physical next late march or early april Future Appointments  Date Time Provider [place]  08/22/2021  9:40 AM Marin Olp, MD [facility]  10/23/2021  8:05 AM [facility] DEVICE REMOTES [facility]  01/22/2022  8:05 AM [facility] DEVICE REMOTES [facility]  04/23/2022  8:05 AM [facility] DEVICE REMOTES [facility]    Lab/Order associations:   ICD-10-CM   1. Unintentional weight loss  R63.4     2. Atrial fibrillation, unspecified type ([place])  I48.91     3. Essential hypertension  I10     4. Thrombocytopenia ([place])  D69.6     5. Aortic atherosclerosis ([facility])  I70.0     6. Hyperlipidemia, unspecified hyperlipidemia type  E78.5     7. Acute pain of right knee  M25.561      Meds ordered this encounter  Medications   lisinopril (ZESTRIL) 2.5 MG tablet    Sig: Take 1 tablet (2.5 mg total) by mouth daily. Do not fill until patient calls please.    Dispense:  90 tablet    Refill:  3   I,Jada Bradford,acting as a scribe for Garret Reddish, MD.,have documented all relevant documentation on the behalf of Garret Reddish, MD,as directed by  Garret Reddish, MD while in the presence of Garret Reddish, MD.  I, Garret Reddish, MD, have reviewed all documentation for this visit. The documentation on 08/12/21 for the exam, diagnosis, procedures, and orders are all accurate and complete.   Return precautions advised.  Garret Reddish, MD

## 2021-08-12 NOTE — Patient Instructions (Addendum)
Health Maintenance Due  Topic Date Due   Zoster Vaccines- Shingrix (1 of 2)  - consider at pharmacy  Never done   COVID-19 Vaccine (4 - Booster)   - Please consider new Omicron-Specific shot at your pharmacy.  12/08/2020   INFLUENZA VACCINE    - high-dose flu shot today.   06/24/2021   Let us know if further weight loss  Recommended follow up: Return in about 3-4 months (around 11/11/2021 or 12/12/2021) for follow-up or sooner if needed.

## 2021-08-21 DIAGNOSIS — H353132 Nonexudative age-related macular degeneration, bilateral, intermediate dry stage: Secondary | ICD-10-CM | POA: Diagnosis not present

## 2021-08-22 ENCOUNTER — Ambulatory Visit: Payer: Medicare HMO | Admitting: Family Medicine

## 2021-08-31 ENCOUNTER — Other Ambulatory Visit: Payer: Self-pay | Admitting: Internal Medicine

## 2021-09-02 NOTE — Telephone Encounter (Signed)
Prescription refill request for Eliquis received. Indication:Afib  Last office visit: 02/19/21 Caryl Comes) Scr: 1.07 (07/18/21) Age: 87 Weight: 66.3kg  Appropriate dose and refill sent to requested pharmacy.

## 2021-09-04 DIAGNOSIS — M1612 Unilateral primary osteoarthritis, left hip: Secondary | ICD-10-CM | POA: Diagnosis not present

## 2021-09-05 ENCOUNTER — Telehealth: Payer: Self-pay | Admitting: Internal Medicine

## 2021-09-05 MED ORDER — APIXABAN 5 MG PO TABS: 5.0000 mg | ORAL_TABLET | Freq: Two times a day (BID) | ORAL | 1 refills | Status: DC

## 2021-09-05 NOTE — Telephone Encounter (Signed)
Per message pt is requesting a 90 day supply of Eliquis. The last Eliquis refill was sent on 10/10/222 for 30 day supply with no refills. Patient is 87 years old, weight-66.3kg, Crea-1.07 on 07/18/2021, Diagnosis-Afib, and last seen by Dr. Caryl Comes on 02/19/2021. Dose is appropriate based on dosing criteria. Will send in refill to requested pharmacy.

## 2021-09-05 NOTE — Telephone Encounter (Signed)
Patient called to say that he would like his Eliquis to be in 90 days and not 30s days.

## 2021-10-23 ENCOUNTER — Ambulatory Visit (INDEPENDENT_AMBULATORY_CARE_PROVIDER_SITE_OTHER): Payer: Medicare HMO

## 2021-10-23 DIAGNOSIS — I4821 Permanent atrial fibrillation: Secondary | ICD-10-CM

## 2021-10-24 LAB — CUP PACEART REMOTE DEVICE CHECK
Battery Remaining Longevity: 24 mo
Battery Remaining Percentage: 33 %
Brady Statistic RV Percent Paced: 48 %
Date Time Interrogation Session: 20221201141600
Implantable Lead Implant Date: 19960809
Implantable Lead Location: 753860
Implantable Lead Model: 4285
Implantable Lead Serial Number: 209144
Implantable Pulse Generator Implant Date: 20130614
Lead Channel Impedance Value: 761 Ohm
Lead Channel Pacing Threshold Amplitude: 1 V
Lead Channel Pacing Threshold Pulse Width: 0.4 ms
Lead Channel Setting Pacing Amplitude: 1.4 V
Lead Channel Setting Pacing Pulse Width: 0.4 ms
Lead Channel Setting Sensing Sensitivity: 2.5 mV
Pulse Gen Serial Number: 115653

## 2021-10-25 ENCOUNTER — Telehealth: Payer: Self-pay | Admitting: *Deleted

## 2021-10-25 ENCOUNTER — Other Ambulatory Visit: Payer: Self-pay | Admitting: Orthopedic Surgery

## 2021-10-25 DIAGNOSIS — M1612 Unilateral primary osteoarthritis, left hip: Secondary | ICD-10-CM | POA: Diagnosis not present

## 2021-10-25 DIAGNOSIS — M25552 Pain in left hip: Secondary | ICD-10-CM

## 2021-10-25 NOTE — Telephone Encounter (Signed)
   Pre-operative Risk Assessment    Patient Name: Eric Lambert  DOB: 02-25-1929 MRN: [id number]      Request for Surgical Clearance   Procedure:   LEFT TOTAL HIP REPLACEMENT  Date of Surgery: Clearance TBD                                 Surgeon:  DR. Edmonia Lynch Surgeon's Group or Practice Name:  [facility] Phone number:  [phone] ATTN: KELLY EXT [id number] Fax number:  [phone] ATTN: KELLY    Type of Clearance Requested: - Medical  - Pharmacy:  Hold Apixaban (Eliquis)     Type of Anesthesia:   SPINAL    Additional requests/questions:   Jiles Prows   10/25/2021, 4:41 PM

## 2021-10-28 NOTE — Telephone Encounter (Signed)
LMTCB - 10/28/21

## 2021-10-28 NOTE — Telephone Encounter (Signed)
Pharm please address eliquis for hip replacement thanks.

## 2021-10-28 NOTE — Telephone Encounter (Signed)
Patient with diagnosis of atrial fibrillation on Eliquis for anticoagulation.    Procedure: left total hip Date of procedure: TBD   CHA2DS2-VASc Score = 4   This indicates a 4.8% annual risk of stroke. The patient's score is based upon: CHF History: 1 HTN History: 1 Diabetes History: 0 Stroke History: 0 Vascular Disease History: 0 Age Score: 2 Gender Score: 0   CrCl 43 Platelet count 158  Per office protocol, patient can hold Eliquis for 3 days prior to procedure.   Patient will not need bridging with Lovenox (enoxaparin) around procedure.

## 2021-10-29 NOTE — Telephone Encounter (Signed)
   Name: Eric Lambert  DOB: [birth year]  MRN: [id number]   Primary Cardiologist: Virl Axe, MD  Chart reviewed as part of pre-operative protocol coverage. Patient was contacted 10/29/2021 in reference to pre-operative risk assessment for pending surgery as outlined below.  Eric Lambert was last seen on 02/19/21 by Dr. Caryl Comes.  Since that day, Eric Lambert has done well. He had a reassuring echocardiogram 02/2021. He reports ability to complete more than 4.0 METS without angina (can push mow leaves, can climb a flight a stairs). He underwent hernia surgery earlier this year without cardiac complications. He understands he is at higher risk given his age and would like to proceed with surgery.   Per our clinical pharmacist: Per office protocol, patient can hold Eliquis for 3 days prior to procedure.   Patient will not need bridging with Lovenox (enoxaparin) around procedure.  Therefore, based on ACC/AHA guidelines, the patient would be at acceptable risk for the planned procedure without further cardiovascular testing.   The patient was advised that if he develops new symptoms prior to surgery to contact our office to arrange for a follow-up visit, and he verbalized understanding.  I will route this recommendation to the requesting party via Epic fax function and remove from pre-op pool. Please call with questions.  Tami Lin Abbie Berling, PA 10/29/2021, 10:23 AM

## 2021-10-31 DIAGNOSIS — M6281 Muscle weakness (generalized): Secondary | ICD-10-CM | POA: Diagnosis not present

## 2021-10-31 DIAGNOSIS — M25652 Stiffness of left hip, not elsewhere classified: Secondary | ICD-10-CM | POA: Diagnosis not present

## 2021-10-31 DIAGNOSIS — R262 Difficulty in walking, not elsewhere classified: Secondary | ICD-10-CM | POA: Diagnosis not present

## 2021-10-31 DIAGNOSIS — M1612 Unilateral primary osteoarthritis, left hip: Secondary | ICD-10-CM | POA: Diagnosis not present

## 2021-11-01 NOTE — Progress Notes (Signed)
Remote pacemaker transmission.   

## 2021-11-04 NOTE — Progress Notes (Signed)
Phone [phone] In person visit   Subjective:   Eric Lambert is a 87 y.o. year old very pleasant male patient who presents for/with See problem oriented charting Chief Complaint  Patient presents with   Hypertension    Follow up- BLE- worse in left than right with rash on left foot for 1 month   Cough    Worse at night when he lays down has had for 1 month now    This visit occurred during the SARS-CoV-2 public health emergency.  Safety protocols were in place, including screening questions prior to the visit, additional usage of staff PPE, and extensive cleaning of exam room while observing appropriate contact time as indicated for disinfecting solutions.   Past Medical History-  Patient Active Problem List   Diagnosis Date Noted   PPM-[employer] 02/07/2009    Priority: High   ATRIAL FIBRILLATION 10/21/2007    Priority: High   BPPV (benign paroxysmal positional vertigo) 12/10/2017    Priority: Medium    Bradycardia 02/02/2013    Priority: Medium    Thrombocytopenia ([place]) 08/04/2011    Priority: Medium    Obstructive sleep apnea 10/21/2007    Priority: Medium    Essential hypertension 10/21/2007    Priority: Medium    GERD 10/21/2007    Priority: Medium    BPH associated with nocturia 10/21/2007    Priority: Medium    Erectile dysfunction 05/28/2016    Priority: Low   Macular degeneration 05/28/2016    Priority: Low   Right hip pain 08/04/2013    Priority: Low   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 180 tablet 1   cholecalciferol (VITAMIN D) 1000 UNITS tablet Take 1,000 Units by mouth daily.     lisinopril (ZESTRIL) 2.5 MG tablet Take 1 tablet (2.5 mg total) by mouth daily. Do not fill until patient calls please. 90 tablet 3   Multiple Vitamins-Minerals (OCUVITE EYE HEALTH FORMULA PO) Take 1 tablet by mouth in the morning and at bedtime.     verapamil (CALAN-SR) 240 MG CR tablet TAKE 1 TABLET BY MOUTH ONCE DAILY * KEEP UPCOMING APPOINTMENT* 90 tablet 3   acetaminophen (TYLENOL) 500 MG tablet Take 1,000 mg by mouth every 8 (eight) hours as needed for moderate pain.     oxyCODONE (OXY IR/ROXICODONE) 5 MG immediate release tablet Take 1 tablet (5 mg total) by mouth every 6 (six) hours as needed for severe pain. (Patient not taking: Reported on 08/12/2021) 10 tablet 0   No current facility-administered medications for this visit.     Objective:  BP 132/60 (BP Location: Left Arm, Patient Position: Sitting)   Pulse 68   Temp 98.2 F (36.8 C) (Tympanic)   Wt 155 lb (70.3 kg)   SpO2 99%   BMI 22.89 kg/m  Gen: NAD, resting comfortably CV: RRR no murmurs rubs or gallops Lungs: CTAB no crackles, wheeze, rhonchi Ext:  trace edema on the right and 1+ on the left- he has a 6 x 5  cm area on the right foot around just proximal to great toe consistent with athletes foot- raised border with central clearing Skin: warm, dry Neuro: walks with cane    Assessment and Plan    # Hip surgery- coming up on the left side with orthopedics in February of next year. Walking with cane still supports on arm but this is not new.   #Cough S: Patient reports worsening cough when he lays down at night for the last month. Still coughs if on side.  Does not feel winded with laying down. States its a "loose" cough. Not much daytime cough. Plus weight is trending up plus having more edema.  -not a large bother to him but bothers his wife. Water seems to help A/P: mild issue overall-  offered repeat CXR or could change lisinopril- since doing better with water- wants to monitor for now.     # unintentional weight loss S:Office visit on 07/18/2021 with [name], PA-C for evaluation of unintentional weight loss. He enjoyed garden work and doesn't do any additional exercise outside of yard work. Had PT for general aches and pains once weekly. No change in medications or appetite. Denied any depression. Wife cooked meals, usually the "heavier" meals.  -labs largely reassuring-prediabetes noted with a1c of 5.8  -PHQ-9 score of 2, therefore doubtful of any depression causation -Chest x-ray was also reassuring and he was a never smoker -last PSA check with urology- stopped at age 89 -plan was to consider CT of the chest and abdomen pelvis if continued weight loss was noted but instead patient has gained 13 pounds today compared to initial visit!   9/22 visit, weighted up 4 lbs from last visit with Alyssa. Down 5 lbs from our last in office check  in March with each other and 10 lbs from last year in november.  Not exercising anymore - we discussed prior D/c checks of PSA per urology at 88. Dad had prostate cancer.  We did not update PSA unless worsening symptoms - he does admit to some dental pain which could potentially affect eating- after further reflection.  We thought dental pain could be contributing- teeth are doing better now.  -doing boost about once a day and seemed to be helpful- he has stopped.  - dessert now 3x a week and eating 3 meals a day still.  Wt Readings from Last 3 Encounters:  11/11/21 155 lb (70.3 kg)  08/12/21 146 lb 3.2 oz (66.3 kg)  07/18/21 142 lb 3.2 oz (64.5 kg)  A/P:  continues to stabilize/go up- continue to monitor. Need to monitor with cardiomyopathy as well  # Atrial fibrillation with history of secondary cardiomyopathy--follows with Dr. Hortencia Pilar with pacemaker for symptomatic bradycardia  S: Rate controlled with diltiazem 240 mg   Anticoagulated with Eliquis 5 mg twice daily.  Last visit with cardiology was in March -From that visit-no known CAD but will get echo as had hx of cardiomyopathy with normalization of LVEF. Echo was normal per notes 03/20/21 by Dr. Caryl Comes A/P: Atrial fibrillation is rate controlled and appropriately anticoagulated- -Cardiopathy improved on most recent echo- was noted as normal. Continue lisinopril.  - No bradycardia with pacemaker  #hypertension S: medication: Lisinopril 2.5 mg  (had to go down from 2.5 alternating with 5 mg every other day)  as well as verapamil 240 mg daily-though primarily for A. fib and lisinopril for cardiomyopathy -Does have some swelling in the legs with rash on the left foot  BP Readings  from Last 3 Encounters:  11/11/21 132/60  08/12/21 (!) 117/58  07/18/21 101/60  A/P: Blood pressure well controlled-typically would continue lisinopril and verapamil but with cough we discussed possibly stopping lisinopril - he prefers to continue as only mild issue  -rash appears to be athletes foot- recommended lamisil -swelling on both sides but worse on left- advised to watch salt intake instead of starting fluid pill   #hyperlipidemia #Aortic atherosclerosis S: Medication:none  Lab Results  Component Value Date   CHOL 160 08/13/2020   HDL 77 08/13/2020   LDLCALC 70 08/13/2020   TRIG 53 08/13/2020   CHOLHDL 2.1 08/13/2020   A/P: Lipids have been at goal even without cholesterol medication-we will update lipids and unlikely to start medicine unless LDL well above 70  #has had bivalent booster- he will let us know the date  Recommended follow up: around 4 months for physical  Future Appointments  Date Time Provider [place]  11/12/2021  2:20 PM [facility] 1 [facility]  12/26/2021 10:00 AM [facility] PAT 1 [facility] None  01/22/2022  8:05 AM [facility] DEVICE REMOTES [facility]  04/23/2022  8:05 AM [facility] DEVICE REMOTES [facility]  [facility]   Lab/Order associations:   ICD-10-CM   1. Essential hypertension  I10     2. Hyperlipidemia, unspecified hyperlipidemia type  E78.5 CBC with Differential/Platelet    Comprehensive metabolic panel    Lipid panel    3. Aortic atherosclerosis ([facility])  I70.0     4. Atrial fibrillation, unspecified type ([place])  I48.91       No orders of the defined types were placed in this encounter.  I,Jada Bradford,acting as a scribe for Garret Reddish, MD.,have documented all relevant documentation on the behalf of Garret Reddish, MD,as directed by  Garret Reddish, MD while in the presence of Garret Reddish, MD.  I, Garret Reddish, MD, have reviewed all documentation for this visit. The documentation on 11/11/21 for the exam, diagnosis, procedures, and orders are all accurate and complete.   Return precautions advised.  Garret Reddish, MD

## 2021-11-06 DIAGNOSIS — M6281 Muscle weakness (generalized): Secondary | ICD-10-CM | POA: Diagnosis not present

## 2021-11-06 DIAGNOSIS — M1612 Unilateral primary osteoarthritis, left hip: Secondary | ICD-10-CM | POA: Diagnosis not present

## 2021-11-06 DIAGNOSIS — R262 Difficulty in walking, not elsewhere classified: Secondary | ICD-10-CM | POA: Diagnosis not present

## 2021-11-06 DIAGNOSIS — M25652 Stiffness of left hip, not elsewhere classified: Secondary | ICD-10-CM | POA: Diagnosis not present

## 2021-11-08 DIAGNOSIS — M6281 Muscle weakness (generalized): Secondary | ICD-10-CM | POA: Diagnosis not present

## 2021-11-08 DIAGNOSIS — R262 Difficulty in walking, not elsewhere classified: Secondary | ICD-10-CM | POA: Diagnosis not present

## 2021-11-08 DIAGNOSIS — M25652 Stiffness of left hip, not elsewhere classified: Secondary | ICD-10-CM | POA: Diagnosis not present

## 2021-11-08 DIAGNOSIS — M1612 Unilateral primary osteoarthritis, left hip: Secondary | ICD-10-CM | POA: Diagnosis not present

## 2021-11-11 ENCOUNTER — Ambulatory Visit (INDEPENDENT_AMBULATORY_CARE_PROVIDER_SITE_OTHER): Payer: Medicare HMO | Admitting: Family Medicine

## 2021-11-11 ENCOUNTER — Encounter: Payer: Self-pay | Admitting: Family Medicine

## 2021-11-11 ENCOUNTER — Other Ambulatory Visit: Payer: Self-pay

## 2021-11-11 VITALS — BP 132/60 | HR 68 | Temp 98.2°F | Wt 155.0 lb

## 2021-11-11 DIAGNOSIS — M25652 Stiffness of left hip, not elsewhere classified: Secondary | ICD-10-CM | POA: Diagnosis not present

## 2021-11-11 DIAGNOSIS — R262 Difficulty in walking, not elsewhere classified: Secondary | ICD-10-CM | POA: Diagnosis not present

## 2021-11-11 DIAGNOSIS — E785 Hyperlipidemia, unspecified: Secondary | ICD-10-CM | POA: Diagnosis not present

## 2021-11-11 DIAGNOSIS — I700 Atherosclerosis of aorta: Secondary | ICD-10-CM

## 2021-11-11 DIAGNOSIS — I4891 Unspecified atrial fibrillation: Secondary | ICD-10-CM

## 2021-11-11 DIAGNOSIS — M1612 Unilateral primary osteoarthritis, left hip: Secondary | ICD-10-CM | POA: Diagnosis not present

## 2021-11-11 DIAGNOSIS — I10 Essential (primary) hypertension: Secondary | ICD-10-CM

## 2021-11-11 DIAGNOSIS — D696 Thrombocytopenia, unspecified: Secondary | ICD-10-CM

## 2021-11-11 DIAGNOSIS — M6281 Muscle weakness (generalized): Secondary | ICD-10-CM | POA: Diagnosis not present

## 2021-11-11 LAB — CBC WITH DIFFERENTIAL/PLATELET
Basophils Absolute: 0 10*3/uL (ref 0.0–0.1)
Basophils Relative: 0.6 % (ref 0.0–3.0)
Eosinophils Absolute: 0.2 10*3/uL (ref 0.0–0.7)
Eosinophils Relative: 4.4 % (ref 0.0–5.0)
HCT: 39.2 % (ref 39.0–52.0)
Hemoglobin: 13.1 g/dL (ref 13.0–17.0)
Lymphocytes Relative: 17.3 % (ref 12.0–46.0)
Lymphs Abs: 1 10*3/uL (ref 0.7–4.0)
MCHC: 33.4 g/dL (ref 30.0–36.0)
MCV: 93.1 fl (ref 78.0–100.0)
Monocytes Absolute: 0.5 10*3/uL (ref 0.1–1.0)
Monocytes Relative: 8.5 % (ref 3.0–12.0)
Neutro Abs: 3.9 10*3/uL (ref 1.4–7.7)
Neutrophils Relative %: 69.2 % (ref 43.0–77.0)
Platelets: 140 10*3/uL — ABNORMAL LOW (ref 150.0–400.0)
RBC: 4.21 Mil/uL — ABNORMAL LOW (ref 4.22–5.81)
RDW: 13 % (ref 11.5–15.5)
WBC: 5.6 10*3/uL (ref 4.0–10.5)

## 2021-11-11 MED ORDER — TERBINAFINE HCL 1 % EX CREA
1.0000 | TOPICAL_CREAM | Freq: Two times a day (BID) | CUTANEOUS | 1 refills | Status: DC
Start: 2021-11-11 — End: 2022-11-07

## 2021-11-11 NOTE — Patient Instructions (Addendum)
Health Maintenance Due  Topic Date Due   Zoster Vaccines- Shingrix (1 of 2)-  -Please consider getting your shingles shot at your local pharmacy. If received, please let me know. Never done   COVID-19 Vaccine (4 - Booster)- - Please find the date of your last covid shot and please send it to [place].  11/10/2020   Please trail Lamisil/Terbinafine over-the-counter medication for rash on foot for a minimal of 2 weeks and at least another week when rash clears completely. If any new, worsening or persistent symptoms, please let us know.   Please stop by lab before you go If you have mychart- we will send your results within 3 business days of [place] receiving them.  If you do not have mychart- we will call you about results within 5 business days of [place] receiving them.  *please also note that you will see labs on mychart as soon as they post. I will later go in and write notes on them- will say "notes from Dr. Yong Channel"  Recommended follow up: Return in about 4 months (around 03/12/2022) for physical or sooner if needed.  Praying your surgery goes well and Happy Holidays!

## 2021-11-12 ENCOUNTER — Telehealth: Payer: Self-pay

## 2021-11-12 ENCOUNTER — Ambulatory Visit
Admission: RE | Admit: 2021-11-12 | Discharge: 2021-11-12 | Disposition: A | Discharge status: Home / Self Care | Payer: Medicare HMO | Source: Ambulatory Visit | Attending: Orthopedic Surgery | Admitting: Orthopedic Surgery

## 2021-11-12 DIAGNOSIS — M25552 Pain in left hip: Secondary | ICD-10-CM

## 2021-11-12 DIAGNOSIS — I739 Peripheral vascular disease, unspecified: Secondary | ICD-10-CM | POA: Diagnosis not present

## 2021-11-12 DIAGNOSIS — M1612 Unilateral primary osteoarthritis, left hip: Secondary | ICD-10-CM | POA: Diagnosis not present

## 2021-11-12 LAB — COMPREHENSIVE METABOLIC PANEL
ALT: 11 U/L (ref 0–53)
AST: 13 U/L (ref 0–37)
Albumin: 3.9 g/dL (ref 3.5–5.2)
Alkaline Phosphatase: 65 U/L (ref 39–117)
BUN: 35 mg/dL — ABNORMAL HIGH (ref 6–23)
CO2: 29 mEq/L (ref 19–32)
Calcium: 9.4 mg/dL (ref 8.4–10.5)
Chloride: 103 mEq/L (ref 96–112)
Creatinine, Ser: 1.02 mg/dL (ref 0.40–1.50)
GFR: 64.76 mL/min (ref >60.00)
Glucose, Bld: 81 mg/dL (ref 70–99)
Potassium: 5.1 mEq/L (ref 3.5–5.1)
Sodium: 139 mEq/L (ref 135–145)
Total Bilirubin: 0.6 mg/dL (ref 0.2–1.2)
Total Protein: 6.4 g/dL (ref 6.0–8.3)

## 2021-11-12 LAB — LIPID PANEL
Cholesterol: 146 mg/dL (ref 0–200)
HDL: 81.6 mg/dL (ref >39.00)
LDL Cholesterol: 54 mg/dL (ref 0–99)
NonHDL: 64.23
Total CHOL/HDL Ratio: 2
Triglycerides: 51 mg/dL (ref 0.0–149.0)
VLDL: 10.2 mg/dL (ref 0.0–40.0)

## 2021-11-12 NOTE — Telephone Encounter (Signed)
Chart updated with Covid Vaccine information.

## 2021-11-12 NOTE — Telephone Encounter (Signed)
Patient has called in regard to his lab results. I have read him Dr. Ronney Lion response.  Patient understood.  Had no questions.

## 2021-11-12 NOTE — Telephone Encounter (Signed)
Patient calling in to document COVID vacs (states all are [employer]):  12/09/19 - first vaccination  12/30/19 - second vaccination  08/20/20 - third vaccination  03/25/21 - fourth vaccination   09/11/21 - fifth vaccination

## 2021-11-13 DIAGNOSIS — M25652 Stiffness of left hip, not elsewhere classified: Secondary | ICD-10-CM | POA: Diagnosis not present

## 2021-11-13 DIAGNOSIS — R262 Difficulty in walking, not elsewhere classified: Secondary | ICD-10-CM | POA: Diagnosis not present

## 2021-11-13 DIAGNOSIS — M6281 Muscle weakness (generalized): Secondary | ICD-10-CM | POA: Diagnosis not present

## 2021-11-13 DIAGNOSIS — M1612 Unilateral primary osteoarthritis, left hip: Secondary | ICD-10-CM | POA: Diagnosis not present

## 2021-11-19 DIAGNOSIS — M1612 Unilateral primary osteoarthritis, left hip: Secondary | ICD-10-CM | POA: Diagnosis not present

## 2021-11-19 DIAGNOSIS — M6281 Muscle weakness (generalized): Secondary | ICD-10-CM | POA: Diagnosis not present

## 2021-11-19 DIAGNOSIS — R262 Difficulty in walking, not elsewhere classified: Secondary | ICD-10-CM | POA: Diagnosis not present

## 2021-11-19 DIAGNOSIS — M25652 Stiffness of left hip, not elsewhere classified: Secondary | ICD-10-CM | POA: Diagnosis not present

## 2021-12-09 DIAGNOSIS — M1612 Unilateral primary osteoarthritis, left hip: Secondary | ICD-10-CM | POA: Diagnosis not present

## 2021-12-24 NOTE — Progress Notes (Signed)
Covid test on 01/03/2022 at _____come thru main entrance at [facility] long have a seat in the lobby on the right as you come thru the door. Call [phone] and let them know you are here for covid testing.  .                 Your procedure is scheduled on:    01/07/22.   Report to [facility] Main  Entrance   Report to admitting at   [phone]     Call this number if you have problems the morning of surgery [phone]    REMEMBER: NO  SOLID FOOD CANDY OR GUM AFTER MIDNIGHT. CLEAR LIQUIDS UNTIL    0430am       . NOTHING BY MOUTH EXCEPT CLEAR LIQUIDS UNTIL   0430am  . PLEASE FINISH ENSURE DRINK PER SURGEON ORDER  WHICH NEEDS TO BE COMPLETED AT   0430am    .      CLEAR LIQUID DIET   Foods Allowed                                                                    Coffee and tea, regular and decaf                            Fruit ices (not with fruit pulp)                                      Iced Popsicles                                    Carbonated beverages, regular and diet                                    Cranberry, grape and apple juices Sports drinks like Gatorade Lightly seasoned clear broth or consume(fat free) Sugar, honey syrup ___________________________________________________________________      BRUSH YOUR TEETH MORNING OF SURGERY AND RINSE YOUR MOUTH OUT, NO CHEWING GUM CANDY OR MINTS.     Take these medicines the morning of surgery with A SIP OF WATER:  none   DO NOT TAKE ANY DIABETIC MEDICATIONS DAY OF YOUR SURGERY                               You may not have any metal on your body including hair pins and              piercings  Do not wear jewelry, make-up, lotions, powders or perfumes, deodorant             Do not wear nail polish on your fingernails.  Do not shave  48 hours prior to surgery.              Men may shave face and neck.   Do not bring valuables to the hospital. [place] IS NOT  RESPONSIBLE   FOR VALUABLES.  Contacts,  dentures or bridgework may not be worn into surgery.  Leave suitcase in the car. After surgery it may be brought to your room.     Patients discharged the day of surgery will not be allowed to drive home. IF YOU ARE HAVING SURGERY AND GOING HOME THE SAME DAY, YOU MUST HAVE AN ADULT TO DRIVE YOU HOME AND BE WITH YOU FOR 24 HOURS. YOU MAY GO HOME BY TAXI OR UBER OR ORTHERWISE, BUT AN ADULT MUST ACCOMPANY YOU HOME AND STAY WITH YOU FOR 24 HOURS.  Name and phone number of your driver:  Special Instructions: N/A              Please read over the following fact sheets you were given: _____________________________________________________________________  [facility] - Preparing for Surgery Before surgery, you can play an important role.  Because skin is not sterile, your skin needs to be as free of germs as possible.  You can reduce the number of germs on your skin by washing with CHG (chlorahexidine gluconate) soap before surgery.  CHG is an antiseptic cleaner which kills germs and bonds with the skin to continue killing germs even after washing. Please DO NOT use if you have an allergy to CHG or antibacterial soaps.  If your skin becomes reddened/irritated stop using the CHG and inform your nurse when you arrive at Short Stay. Do not shave (including legs and underarms) for at least 48 hours prior to the first CHG shower.  You may shave your face/neck. Please follow these instructions carefully:  1.  Shower with CHG Soap the night before surgery and the  morning of Surgery.  2.  If you choose to wash your hair, wash your hair first as usual with your  normal  shampoo.  3.  After you shampoo, rinse your hair and body thoroughly to remove the  shampoo.                           4.  Use CHG as you would any other liquid soap.  You can apply chg directly  to the skin and wash                       Gently with a scrungie or clean washcloth.  5.  Apply the CHG Soap to your body ONLY FROM THE NECK DOWN.   Do  not use on face/ open                           Wound or open sores. Avoid contact with eyes, ears mouth and genitals (private parts).                       Wash face,  Genitals (private parts) with your normal soap.             6.  Wash thoroughly, paying special attention to the area where your surgery  will be performed.  7.  Thoroughly rinse your body with warm water from the neck down.  8.  DO NOT shower/wash with your normal soap after using and rinsing off  the CHG Soap.                9.  Pat yourself dry with a clean towel.            10.  Wear clean pajamas.            11.  Place clean sheets on your bed the night of your first shower and do not  sleep with pets. Day of Surgery : Do not apply any lotions/deodorants the morning of surgery.  Please wear clean clothes to the hospital/surgery center.  FAILURE TO FOLLOW THESE INSTRUCTIONS MAY RESULT IN THE CANCELLATION OF YOUR SURGERY PATIENT SIGNATURE_________________________________  NURSE SIGNATURE__________________________________  ________________________________________________________________________

## 2021-12-24 NOTE — Progress Notes (Addendum)
Anesthesia Review:  PCP: DR Garret Reddish  Cardiologist : DR Caryl Comes  Clearance 10/25/21- telephone encounter- Eric Lambert  10/24/21- Last Device check  Device orders on chart  Chest x-ray : 07/19/21- 2v  EKG :02/19/21  Echo : 03/04/21  Stress test: Cardiac Cath :  Activity level: can do a flight of stairs without difficulty  Sleep Study/ CPAP : none  Fasting Blood Sugar :      / Checks Blood Sugar -- times a day:   Blood Thinner/ Instructions /Last Dose: ASA / Instructions/ Last Dose :  Eliquis  - pt unsure of eliquis preop instructions.  Instructed pt to call office of Dr Caryl Comes regarding Eliquis preop instructions.  PT voiced understanding.   PT talked at preop appt in regards to keepin blood pressure regulated.  PT stated " I might change what I am doing.   with my blood pressure medication.  Informed pt to not change medications without discussing with MD.  Instructed pt to let MD be aware of blood pressure readings and to let MD instruct pt . PT voiced understanding.   Covid test on 2/10 at 0845am  PT was 25 minutes late for preop appt.

## 2021-12-26 ENCOUNTER — Telehealth: Payer: Self-pay

## 2021-12-26 ENCOUNTER — Encounter (HOSPITAL_COMMUNITY)
Admission: RE | Admit: 2021-12-26 | Discharge: 2021-12-26 | Disposition: A | Discharge status: Home / Self Care | Payer: Medicare HMO | Source: Ambulatory Visit | Attending: Orthopedic Surgery | Admitting: Orthopedic Surgery

## 2021-12-26 ENCOUNTER — Encounter (HOSPITAL_COMMUNITY): Payer: Self-pay

## 2021-12-26 ENCOUNTER — Telehealth: Payer: Self-pay | Admitting: Internal Medicine

## 2021-12-26 ENCOUNTER — Other Ambulatory Visit: Payer: Self-pay

## 2021-12-26 VITALS — BP 114/60 | HR 61 | Temp 98.2°F | Resp 16 | Ht 69.0 in | Wt 153.0 lb

## 2021-12-26 DIAGNOSIS — I509 Heart failure, unspecified: Secondary | ICD-10-CM | POA: Insufficient documentation

## 2021-12-26 DIAGNOSIS — Z01818 Encounter for other preprocedural examination: Secondary | ICD-10-CM | POA: Diagnosis not present

## 2021-12-26 DIAGNOSIS — G4733 Obstructive sleep apnea (adult) (pediatric): Secondary | ICD-10-CM | POA: Insufficient documentation

## 2021-12-26 DIAGNOSIS — I4821 Permanent atrial fibrillation: Secondary | ICD-10-CM | POA: Diagnosis not present

## 2021-12-26 DIAGNOSIS — Z20822 Contact with and (suspected) exposure to covid-19: Secondary | ICD-10-CM | POA: Diagnosis not present

## 2021-12-26 DIAGNOSIS — M1612 Unilateral primary osteoarthritis, left hip: Secondary | ICD-10-CM | POA: Insufficient documentation

## 2021-12-26 DIAGNOSIS — Z8679 Personal history of other diseases of the circulatory system: Secondary | ICD-10-CM | POA: Insufficient documentation

## 2021-12-26 HISTORY — DX: Dyspnea, unspecified: R06.00

## 2021-12-26 LAB — BASIC METABOLIC PANEL
Anion gap: 6 (ref 5–15)
BUN: 33 mg/dL — ABNORMAL HIGH (ref 8–23)
CO2: 29 mmol/L (ref 22–32)
Calcium: 9.4 mg/dL (ref 8.9–10.3)
Chloride: 103 mmol/L (ref 98–111)
Creatinine, Ser: 1.12 mg/dL (ref 0.61–1.24)
GFR, Estimated: >60 mL/min (ref >60)
Glucose, Bld: 84 mg/dL (ref 70–99)
Potassium: 4.1 mmol/L (ref 3.5–5.1)
Sodium: 138 mmol/L (ref 135–145)

## 2021-12-26 LAB — CBC
HCT: 41.8 % (ref 39.0–52.0)
Hemoglobin: 13.9 g/dL (ref 13.0–17.0)
MCH: 31.1 pg (ref 26.0–34.0)
MCHC: 33.3 g/dL (ref 30.0–36.0)
MCV: 93.5 fL (ref 80.0–100.0)
Platelets: 139 10*3/uL — ABNORMAL LOW (ref 150–400)
RBC: 4.47 MIL/uL (ref 4.22–5.81)
RDW: 13.3 % (ref 11.5–15.5)
WBC: 5 10*3/uL (ref 4.0–10.5)
nRBC: 0 % (ref 0.0–0.2)

## 2021-12-26 LAB — TYPE AND SCREEN
ABO/RH(D): O POS
Antibody Screen: NEGATIVE

## 2021-12-26 LAB — SURGICAL PCR SCREEN
MRSA, PCR: NEGATIVE
Staphylococcus aureus: NEGATIVE

## 2021-12-26 NOTE — Telephone Encounter (Signed)
° °  Pre-operative Risk Assessment    Patient Name: Eric Lambert  DOB: [birth year] MRN: [id number]      Request for Surgical Clearance    Procedure:  L Total Hip Replacement  Date of Surgery:  Clearance 01/07/22                                 Surgeon:  Dr. Edmonia Lynch Surgeon's Group or Practice Name:  [facility] Phone number:  [phone] (Ask for Claiborne Billings)  Fax number:  [phone] Attn: Claiborne Billings   Type of Clearance Requested:   - Medical  - Pharmacy:  Hold Apixaban (Eliquis)    Type of Anesthesia:  Spinal   Additional requests/questions:  See Clearance 12.02.23  Rosalyn Gess   12/26/2021, 3:51 PM

## 2021-12-26 NOTE — Telephone Encounter (Signed)
When was he taking the medication before and what was his blood pressure running at that time?

## 2021-12-26 NOTE — H&P (Signed)
HIP ARTHROPLASTY ADMISSION H&P  Patient ID: Eric Lambert MRN: [id number] DOB/AGE: 08/20/1929 88 y.o.  Chief Complaint: left hip pain.  Planned Procedure Date: 01/07/22 Medical Clearance by Dr. Garret Reddish   Cardiac Clearance by Dr. Virl Axe   HPI: Eric Lambert is a 88 y.o. male who presents for evaluation of OA LEFT HIP. The patient has a history of pain and functional disability in the left hip due to arthritis and has failed non-surgical conservative treatments for greater than 12 weeks to include NSAID's and/or analgesics, corticosteriod injections, supervised PT with diminished ADL's post treatment, use of assistive devices, and activity modification.  Onset of symptoms was gradual, starting 1 year ago with gradually worsening course since that time. The patient noted no past surgery on the left hip.  Patient currently rates pain at 9 out of 10 with activity. Patient has night pain, worsening of pain with activity and weight bearing, and pain that interferes with activities of daily living.  Patient has evidence of subchondral sclerosis, periarticular osteophytes, joint space narrowing, and femoral head collapse  by imaging studies.  There is no active infection.  Past Medical History:  Diagnosis Date   Allergy    Arthritis    Atrial fibrillation -permanent    BPH (benign prostatic hyperplasia)    Cancer (HCC)    HX OF SKIN CANCER    CHF (congestive heart failure) ([place])    resolved after pacemaker - tachycardia induced   Complete heart block (HCC)    Dyspnea    mild   ED (erectile dysfunction)    GERD (gastroesophageal reflux disease)    GLUCOSE INTOLERANCE 10/22/2007   no recent issues   Heart murmur    OSA (obstructive sleep apnea)    NO CPAP    Pacemaker BSX    dual   Pneumonia    HX OF SEVERAL TIMES AS A CHILD    PONV (postoperative nausea and vomiting)    at age 89    Presence of permanent cardiac pacemaker    SUBACUTE BACTERIAL ENDOCARDITIS  1970s   Past Surgical History:  Procedure Laterality Date   INGUINAL HERNIA REPAIR Bilateral 04/08/2021   Procedure: LAPAROSCOPIC BILATERAL INGUINAL HERNIA REPAIR WITH MESH;  Surgeon: Kinsinger, Arta Bruce, MD;  Location: [facility] ORS;  Service: General;  Laterality: Bilateral;   INSERT / REPLACE / REMOVE PACEMAKER     PACEMAKER GENERATOR CHANGE N/A 05/07/2012   Procedure: PACEMAKER GENERATOR CHANGE;  Surgeon: [name], MD;  [facility] C[facility];  Service: Cardiovascular;  Laterality: N/A;   PACEMAKER PLACEMENT     PARTIAL HIP ARTHROPLASTY     2008   No Known Allergies Prior to Admission medications   Medication Sig Start Date End Date Taking? Authorizing Provider  acetaminophen (TYLENOL) 500 MG tablet Take 1,000 mg by mouth every 8 (eight) hours as needed for moderate pain.   Yes [provider]  apixaban (ELIQUIS) 5 MG TABS tablet Take 1 tablet (5 mg total) by mouth 2 (two) times daily. 09/05/21  Yes [name], MD  Cholecalciferol (VITAMIN D) 125 MCG (5000 UT) CAPS Take 5,000 Units by mouth in the morning.   Yes [provider]  hydrochlorothiazide (HYDRODIURIL) 12.5 MG tablet Take 12.5 mg by mouth in the morning.   Yes [provider]  lisinopril (ZESTRIL) 2.5 MG tablet Take 1 tablet (2.5 mg total) by mouth daily. Do not fill until patient calls please. Patient taking differently: Take 2.5 mg by mouth every  evening. Do not fill until patient calls please. 08/12/21  Yes Marin Olp, MD  Multiple Vitamins-Minerals ([place] PO) Take 1 tablet by mouth in the morning and at bedtime.   Yes [provider]  terbinafine (LAMISIL AT) 1 % cream Apply 1 application topically 2 (two) times daily. Patient taking differently: Apply 1 application topically 2 (two) times daily as needed (skin irritaion/rash.). 11/11/21  Yes Marin Olp, MD  verapamil (CALAN-SR) 240 MG CR tablet TAKE 1 TABLET BY MOUTH ONCE DAILY * KEEP UPCOMING  APPOINTMENT* Patient taking differently: 240 mg at bedtime. 05/06/21  Yes [name], MD   Social History   Socioeconomic History   Marital status: Married    Spouse name: Not on file   Number of children: 2   Years of education: Not on file   Highest education level: Not on file  Occupational History   Occupation: Training and development officer: RETIRED    Comment: Working part time  Tobacco Use   Smoking status: Never   Smokeless tobacco: Never  Vaping Use   Vaping Use: Never used  Substance and Sexual Activity   Alcohol use: Not Currently    Comment: hx of 10-15 years ago    Drug use: No   Sexual activity: Not on file  Other Topics Concern   Not on file  Social History Narrative   Married. 2 children. 1 grandkid. 1 greatgrandchild.       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, plays bridge on internet, Neurosurgeon, enjoys talking politics   Social Determinants of Radio broadcast assistant Strain: Not on [employer] Insecurity: Not on file  Transportation Needs: Not on file  Physical Activity: Not on file  Stress: Not on file  Social Connections: Not on file   Family History  Problem Relation Age of Onset   Prostate cancer Father    Heart disease Other        mothers side men- strokes and heart attacks   Diabetes Brother    Prostate cancer Brother    Brain cancer Brother     ROS: Currently denies lightheadedness, dizziness, Fever, chills, CP, SOB.   No personal history of DVT, PE, MI, or CVA. No loose teeth or dentures All other systems have been reviewed and were otherwise currently negative with the exception of those mentioned in the HPI and as above.  Objective: Vitals: Ht: 5'9" Wt: 153 lbs Temp: 98.3 BP: 119/63 Pulse: 77 O2 99% on room air.   Physical Exam: General: Alert, NAD. Trendelenberg Gait  HEENT: EOMI, Good Neck Extension  Pulm: No increased work of breathing.  Clear B/L A/P w/o crackle or wheeze.   CV: RRR, No m/g/r appreciated  GI: soft, NT, ND. BS x 4 quadrants Neuro: CN II-XII grossly intact without focal deficit.  Sensation intact distally Skin: No lesions in the area of chief complaint MSK/Surgical Site: + TTP. Hip ROM limited d/t pain. + Stinchfield. + SLR. + FABER/FADIR. Decreased strength.  NVI.    Imaging Review Plain radiographs demonstrate severe degenerative joint disease of the left hip.   The bone quality appears to be fair for age and reported activity level.  Preoperative templating of the joint replacement has been completed, documented, and submitted to the Operating Room personnel in order to optimize intra-operative equipment management.  Assessment: OA LEFT HIP Active Problems:   * No active  hospital problems. *   Plan: Plan for Procedure(s): TOTAL HIP ARTHROPLASTY ANTERIOR APPROACH  The patient history, physical exam, clinical judgement of the provider and imaging are consistent with end stage degenerative joint disease and total joint arthroplasty is deemed medically necessary. The treatment options including medical management, injection therapy, and arthroplasty were discussed at length. The risks and benefits of Procedure(s): TOTAL HIP ARTHROPLASTY ANTERIOR APPROACH were presented and reviewed.  The risks of nonoperative treatment, versus surgical intervention including but not limited to continued pain, aseptic loosening, stiffness, dislocation/subluxation, infection, bleeding, nerve injury, blood clots, cardiopulmonary complications, morbidity, mortality, among others were discussed. The patient verbalizes understanding and wishes to proceed with the plan.  Patient is being admitted for surgery, pain control, PT, prophylactic antibiotics, VTE prophylaxis, progressive ambulation, ADL's and discharge planning. He will spend the night in observation.   Dental prophylaxis discussed and recommended for 2 years postoperatively.  The patient does meet the  criteria for TXA which will be used perioperatively.   Already on Eliquis 5mg  bid which will be used postoperatively for DVT prophylaxis in addition to SCDs, and early ambulation. Plan for Hydrocodone, Meloxicam, Tylenol for pain.   Robaxin for muscle spasm.  Zofran for nausea and vomiting. Colace for constipation [place] on [place] The patient is planning to be discharged home with HHPT ([place]) and into the care of his wife Marcie Bal who can be reached at [phone] Follow up appt 01-22-22 at [facility] Luciana Axe Office [phone] 12/26/2021 6:25 PM

## 2021-12-26 NOTE — Telephone Encounter (Signed)
° °  Name: Eric Lambert  DOB: [birth year]  MRN: [id number]   Primary Cardiologist: Virl Axe, MD  Chart reviewed as part of pre-operative protocol coverage. Patient was contacted 12/26/2021 in reference to pre-operative risk assessment for pending surgery as outlined below.  Eric Lambert was last seen on 02/19/2021 by Dr. Caryl Lambert.  Since that day, Eric Lambert has done well without exertional chest pain or worsening dyspnea.  Patient was previously cleared for the same surgery in early December.  He was instructed to hold Eliquis for 3 days prior to the surgery and restart as soon as possible afterward at the discretion of the surgeon.  Therefore, based on ACC/AHA guidelines, the patient would be at acceptable risk for the planned procedure without further cardiovascular testing.   The patient was advised that if he develops new symptoms prior to surgery to contact our office to arrange for a follow-up visit, and he verbalized understanding.  I will route this recommendation to the requesting party via Epic fax function and remove from pre-op pool. Please call with questions.  [place], [place] 12/26/2021, 6:36 PM

## 2021-12-26 NOTE — Telephone Encounter (Signed)
Pt called stating that he has been taking his lisinopril between 7:00pm and 8:00pm and has noticed his BP readings running higher and wants to know if he needs to switch the timing of when he takes the lisinopril.  12/24/21: am 128/70, pm 147/82  12/22/21: am 108/56, pm 131/71   12/21/21: am 125/64, pm 128/83 pm  12/18/21: am 150/80, pm 131/73  He went to a pre op appointment today and they told him not to change anything with his lisinopril until he hears from you.

## 2021-12-27 ENCOUNTER — Encounter: Payer: Self-pay | Admitting: Internal Medicine

## 2021-12-27 NOTE — Telephone Encounter (Signed)
Called and spoke with pt and message given. 

## 2021-12-27 NOTE — Telephone Encounter (Signed)
I think overall we are okay-his average over these 8 readings was 131/72-okay to have mild short-term elevations as long as it comes back down-lets continue current medication

## 2021-12-27 NOTE — Anesthesia Preprocedure Evaluation (Addendum)
Anesthesia Evaluation  Patient identified by MRN, date of birth, ID band Patient awake    Reviewed: Allergy & Precautions, NPO status , Patient's Chart, lab work & pertinent test results  History of Anesthesia Complications Negative for: history of anesthetic complications  Airway Mallampati: III  TM Distance: >3 FB Neck ROM: Full    Dental  (+) Teeth Intact, Dental Advisory Given   Pulmonary sleep apnea (no CPAP, unable to tolerate ) ,    Pulmonary exam normal breath sounds clear to auscultation       Cardiovascular hypertension, Pt. on medications (-) CHF Normal cardiovascular exam+ dysrhythmias (eliquis- last dose: 01/03/22) Atrial Fibrillation + pacemaker (CHB) + Valvular Problems/Murmurs (mild MR) MR  Rhythm:Regular Rate:Normal  Echo 03/04/21:  1. Left ventricular ejection fraction, by estimation, is 55 to 60%. The left ventricle has normal function. The left ventricle has no regional wall motion abnormalities. Left ventricular diastolic parameters are indeterminate.  2. Right ventricular systolic function is normal. The right ventricular size is normal. There is normal pulmonary artery systolic pressure. The estimated right ventricular systolic pressure is 09.2 mmHg.  3. Left atrial size was severely dilated.  4. Right atrial size was mild to moderately dilated.  5. The mitral valve is abnormal with mild bileaflet prolapse. Mild mitral valve regurgitation. No evidence of mitral stenosis.  6. The aortic valve is tricuspid. Aortic valve regurgitation is not visualized. Mild aortic valve sclerosis is present, with no evidence of aortic valve stenosis.  7. The inferior vena cava is normal in size with <50% respiratory variability, suggesting right atrial pressure of 8 mmHg.  8. The patient was in atrial fibrillation.     Neuro/Psych negative neurological ROS  negative psych ROS   GI/Hepatic Neg liver ROS, GERD  ,   Endo/Other  negative endocrine ROS  Renal/GU negative Renal ROS  negative genitourinary   Musculoskeletal  (+) Arthritis , Osteoarthritis,    Abdominal   Peds  Hematology hct 41.8, plt139   Anesthesia Other Findings   Reproductive/Obstetrics negative OB ROS                           Anesthesia Physical Anesthesia Plan  ASA: 3  Anesthesia Plan: MAC and Spinal   Post-op Pain Management: Tylenol PO (pre-op)   Induction:   PONV Risk Score and Plan: 2 and Propofol infusion and TIVA  Airway Management Planned: Natural Airway and Simple Face Mask  Additional Equipment: None  Intra-op Plan:   Post-operative Plan:   Informed Consent: I have reviewed the patients History and Physical, chart, labs and discussed the procedure including the risks, benefits and alternatives for the proposed anesthesia with the patient or authorized representative who has indicated his/her understanding and acceptance.     Dental advisory given  Plan Discussed with: CRNA  Anesthesia Plan Comments:       Anesthesia Quick Evaluation

## 2021-12-27 NOTE — Telephone Encounter (Signed)
Called and spoke with pt and he states he has always taken his Lisinopril at night and he does not remember those readings as he has started recently writing the numbers down.

## 2021-12-27 NOTE — Progress Notes (Signed)
[facility] DEVICE PROGRAMMING  Patient Information: Name:  Eric Lambert  DOB:  1929/01/03  MRN:  [id number]     Planned Procedure:  LEFT ANTERIOR TOTAL HIP ARTHROPLASTY  Surgeon:  DR Edmonia Lynch  Date of Procedure:  01/07/22  Cautery will be used.  Position during surgery:   Please send documentation back to:  [facility] (Fax # [phone])  [facility]  Casson, Catena, RN  12/26/2021 9:29 AM  Device Information:  Clinic EP Physician:  Virl Axe, MD   Device Type:  Pacemaker [employer] [id number] ADVANTIO Manufacturer and Phone #:  Franne Forts Scientific: [phone] Pacemaker Dependent?:  Unable to determine currently VP 48% 11/13/21 Date of Last Device Check:  11/13/21 remote, 02/19/21 in clinic Normal Device Function?:  Yes.    Electrophysiologist's Recommendations:  Have magnet available. Provide continuous ECG monitoring when magnet is used or reprogramming is to be performed.  Procedure may interfere with device function.  Magnet should be placed over device during procedure.  Per Device Clinic Standing Orders, Willadean Carol, [place]  7:55 AM 12/27/2021

## 2021-12-27 NOTE — Progress Notes (Signed)
Anesthesia Chart Review:   Case: [id number] Date/Time: 01/07/22 0715   Procedure: TOTAL HIP ARTHROPLASTY ANTERIOR APPROACH (Left: Hip)   Anesthesia type: Choice   Pre-op diagnosis: OA LEFT HIP   Location: [facility] ROOM 08 / [facility]   Surgeons: [name], MD       DISCUSSION: Pt is 88 years old with hx atrial fibrillation, complete heart block, pacemaker [employer], implanted 2013), cardiomyopathy (EF recovered, normal on 2022 echo), bacterial endocarditis (1970s), CHF, OSA  Pt to hold eliquis 3 days before surgery  Perioperative prescription for pacemaker:  Have magnet available. Provide continuous ECG monitoring when magnet is used or reprogramming is to be performed.  Procedure may interfere with device function.  Magnet should be placed over device during procedure.   VS: BP 114/60    Pulse 61    Temp 36.8 C (Oral)    Resp 16    Ht 5\' 9"  (1.753 m)    Wt 69.4 kg    SpO2 100%    BMI 22.59 kg/m   PROVIDERS: - PCP is Marin Olp, MD - Cardiologist is Virl Axe, MD. Last office visit 02/19/21. Cleared for surgery at acceptable risk by Almyra Deforest, PA on 12/26/21    LABS: Labs reviewed: Acceptable for surgery. (all labs ordered are listed, but only abnormal results are displayed)  Labs Reviewed  BASIC METABOLIC PANEL - Abnormal; Notable for the following components:      Result Value   BUN 33 (*)    All other components within normal limits  CBC - Abnormal; Notable for the following components:   Platelets 139 (*)    All other components within normal limits  SURGICAL PCR SCREEN  TYPE AND SCREEN     IMAGES: CXR 07/19/21:  - No active cardiopulmonary disease.   EKG 02/19/21:  atrial fibrillation   CV: Echo 03/04/21:  1. Left ventricular ejection fraction, by estimation, is 55 to 60%. The left ventricle has normal function. The left ventricle has no regional wall motion abnormalities. Left ventricular diastolic parameters are indeterminate.   2. Right  ventricular systolic function is normal. The right ventricular size is normal. There is normal pulmonary artery systolic pressure. The estimated right ventricular systolic pressure is 10.2 mmHg.   3. Left atrial size was severely dilated.   4. Right atrial size was mild to moderately dilated.   5. The mitral valve is abnormal with mild bileaflet prolapse. Mild mitral valve regurgitation. No evidence of mitral stenosis.   6. The aortic valve is tricuspid. Aortic valve regurgitation is not visualized. Mild aortic valve sclerosis is present, with no evidence of aortic valve stenosis.   7. The inferior vena cava is normal in size with <50% respiratory variability, suggesting right atrial pressure of 8 mmHg.   8. The patient was in atrial fibrillation.   Past Medical History:  Diagnosis Date   Allergy    Arthritis    Atrial fibrillation -permanent    BPH (benign prostatic hyperplasia)    Cancer (HCC)    HX OF SKIN CANCER    CHF (congestive heart failure) ([place])    resolved after pacemaker - tachycardia induced   Complete heart block (HCC)    Dyspnea    mild   ED (erectile dysfunction)    GERD (gastroesophageal reflux disease)    GLUCOSE INTOLERANCE 10/22/2007   no recent issues   Heart murmur    OSA (obstructive sleep apnea)    NO CPAP    Pacemaker  BSX    dual   Pneumonia    HX OF SEVERAL TIMES AS A CHILD    PONV (postoperative nausea and vomiting)    at age 64    Presence of permanent cardiac pacemaker    SUBACUTE BACTERIAL ENDOCARDITIS 1970s    Past Surgical History:  Procedure Laterality Date   INGUINAL HERNIA REPAIR Bilateral 04/08/2021   Procedure: LAPAROSCOPIC BILATERAL INGUINAL HERNIA REPAIR WITH MESH;  Surgeon: Kinsinger, Arta Bruce, MD;  Location: [facility];  Service: General;  Laterality: Bilateral;   INSERT / REPLACE / REMOVE PACEMAKER     PACEMAKER GENERATOR CHANGE N/A 05/07/2012   Procedure: PACEMAKER GENERATOR CHANGE;  Surgeon: [name], MD;  Location: [facility] CATH  LAB;  Service: Cardiovascular;  Laterality: N/A;   PACEMAKER PLACEMENT     PARTIAL HIP ARTHROPLASTY     2008    MEDICATIONS:  acetaminophen (TYLENOL) 500 MG tablet   apixaban (ELIQUIS) 5 MG TABS tablet   Cholecalciferol (VITAMIN D) 125 MCG (5000 UT) CAPS   hydrochlorothiazide (HYDRODIURIL) 12.5 MG tablet   lisinopril (ZESTRIL) 2.5 MG tablet   Multiple Vitamins-Minerals (OCUVITE EYE HEALTH FORMULA PO)   terbinafine (LAMISIL AT) 1 % cream   verapamil (CALAN-SR) 240 MG CR tablet   No current facility-administered medications for this encounter.    If no changes, I anticipate pt can proceed with surgery as scheduled.   Willeen Cass, PhD, FNP-BC [facility]/Anesthesiology Phone: [phone] 12/27/2021 11:52 AM

## 2022-01-03 ENCOUNTER — Encounter (HOSPITAL_COMMUNITY)
Admission: RE | Admit: 2022-01-03 | Discharge: 2022-01-03 | Disposition: A | Discharge status: Home / Self Care | Payer: Medicare HMO | Source: Ambulatory Visit | Attending: Orthopedic Surgery | Admitting: Orthopedic Surgery

## 2022-01-03 ENCOUNTER — Other Ambulatory Visit: Payer: Self-pay

## 2022-01-03 DIAGNOSIS — Z01812 Encounter for preprocedural laboratory examination: Secondary | ICD-10-CM | POA: Insufficient documentation

## 2022-01-03 DIAGNOSIS — Z20822 Contact with and (suspected) exposure to covid-19: Secondary | ICD-10-CM | POA: Insufficient documentation

## 2022-01-03 LAB — SARS CORONAVIRUS 2 (TAT 6-24 HRS): SARS Coronavirus 2: NEGATIVE

## 2022-01-07 ENCOUNTER — Encounter (HOSPITAL_COMMUNITY)
Admission: RE | Disposition: A | Discharge status: Home Health | Payer: Self-pay | Source: Home / Self Care | Attending: Orthopedic Surgery

## 2022-01-07 ENCOUNTER — Other Ambulatory Visit: Payer: Self-pay

## 2022-01-07 ENCOUNTER — Encounter (HOSPITAL_COMMUNITY): Payer: Self-pay | Admitting: Orthopedic Surgery

## 2022-01-07 ENCOUNTER — Observation Stay (HOSPITAL_COMMUNITY)
Admission: RE | Admit: 2022-01-07 | Discharge: 2022-01-08 | Disposition: A | Discharge status: Home Health | Payer: Medicare HMO | Attending: Orthopedic Surgery | Admitting: Orthopedic Surgery

## 2022-01-07 ENCOUNTER — Ambulatory Visit (HOSPITAL_COMMUNITY): Payer: Medicare HMO

## 2022-01-07 ENCOUNTER — Observation Stay (HOSPITAL_BASED_OUTPATIENT_CLINIC_OR_DEPARTMENT_OTHER): Payer: Medicare HMO | Admitting: Anesthesiology

## 2022-01-07 ENCOUNTER — Observation Stay (HOSPITAL_COMMUNITY): Payer: Medicare HMO | Admitting: Anesthesiology

## 2022-01-07 ENCOUNTER — Ambulatory Visit (HOSPITAL_COMMUNITY)
Admission: RE | Admit: 2022-01-07 | Discharge: 2022-01-07 | Disposition: A | Discharge status: Home / Self Care | Payer: Medicare HMO | Source: Home / Self Care | Attending: Orthopedic Surgery | Admitting: Orthopedic Surgery

## 2022-01-07 ENCOUNTER — Ambulatory Visit (HOSPITAL_BASED_OUTPATIENT_CLINIC_OR_DEPARTMENT_OTHER): Payer: Medicare HMO | Admitting: Anesthesiology

## 2022-01-07 ENCOUNTER — Ambulatory Visit (HOSPITAL_COMMUNITY): Payer: Medicare HMO | Admitting: Emergency Medicine

## 2022-01-07 DIAGNOSIS — Z7901 Long term (current) use of anticoagulants: Secondary | ICD-10-CM | POA: Insufficient documentation

## 2022-01-07 DIAGNOSIS — Z950 Presence of cardiac pacemaker: Secondary | ICD-10-CM | POA: Diagnosis not present

## 2022-01-07 DIAGNOSIS — I4891 Unspecified atrial fibrillation: Secondary | ICD-10-CM | POA: Insufficient documentation

## 2022-01-07 DIAGNOSIS — G4730 Sleep apnea, unspecified: Secondary | ICD-10-CM | POA: Insufficient documentation

## 2022-01-07 DIAGNOSIS — I10 Essential (primary) hypertension: Secondary | ICD-10-CM | POA: Diagnosis not present

## 2022-01-07 DIAGNOSIS — T18128A Food in esophagus causing other injury, initial encounter: Secondary | ICD-10-CM

## 2022-01-07 DIAGNOSIS — K222 Esophageal obstruction: Secondary | ICD-10-CM

## 2022-01-07 DIAGNOSIS — M1612 Unilateral primary osteoarthritis, left hip: Secondary | ICD-10-CM | POA: Diagnosis not present

## 2022-01-07 DIAGNOSIS — Z79899 Other long term (current) drug therapy: Secondary | ICD-10-CM | POA: Diagnosis not present

## 2022-01-07 DIAGNOSIS — Z419 Encounter for procedure for purposes other than remedying health state, unspecified: Secondary | ICD-10-CM

## 2022-01-07 DIAGNOSIS — K219 Gastro-esophageal reflux disease without esophagitis: Secondary | ICD-10-CM

## 2022-01-07 DIAGNOSIS — K2100 Gastro-esophageal reflux disease with esophagitis, without bleeding: Secondary | ICD-10-CM | POA: Insufficient documentation

## 2022-01-07 DIAGNOSIS — Z85828 Personal history of other malignant neoplasm of skin: Secondary | ICD-10-CM | POA: Insufficient documentation

## 2022-01-07 DIAGNOSIS — Z96642 Presence of left artificial hip joint: Secondary | ICD-10-CM | POA: Diagnosis present

## 2022-01-07 DIAGNOSIS — X58XXXA Exposure to other specified factors, initial encounter: Secondary | ICD-10-CM | POA: Diagnosis not present

## 2022-01-07 DIAGNOSIS — K209 Esophagitis, unspecified without bleeding: Secondary | ICD-10-CM | POA: Diagnosis not present

## 2022-01-07 DIAGNOSIS — Z471 Aftercare following joint replacement surgery: Secondary | ICD-10-CM | POA: Diagnosis not present

## 2022-01-07 HISTORY — PX: IMPACTION REMOVAL: SHX5858

## 2022-01-07 HISTORY — PX: TOTAL HIP ARTHROPLASTY: SHX124

## 2022-01-07 HISTORY — PX: ESOPHAGOGASTRODUODENOSCOPY (EGD) WITH PROPOFOL: SHX5813

## 2022-01-07 SURGERY — ARTHROPLASTY, HIP, TOTAL, ANTERIOR APPROACH
Anesthesia: Monitor Anesthesia Care | Site: Hip | Laterality: Left

## 2022-01-07 SURGERY — ESOPHAGOGASTRODUODENOSCOPY (EGD) WITH PROPOFOL
Anesthesia: General

## 2022-01-07 MED ORDER — FAMOTIDINE IN NACL 20-0.9 MG/50ML-% IV SOLN
20.0000 mg | Freq: Once | INTRAVENOUS | Status: AC
  Administered 2022-01-07: 20 mg via INTRAVENOUS
  Filled 2022-01-07: qty 50

## 2022-01-07 MED ORDER — HYDROCODONE-ACETAMINOPHEN 7.5-325 MG PO TABS: 1.0000 | ORAL_TABLET | ORAL | Status: DC | PRN

## 2022-01-07 MED ORDER — PROPOFOL 10 MG/ML IV BOLUS
INTRAVENOUS | Status: DC | PRN
Start: 2022-01-07 — End: 2022-01-07
  Administered 2022-01-07: 120 mg via INTRAVENOUS

## 2022-01-07 MED ORDER — PROPOFOL 10 MG/ML IV BOLUS
INTRAVENOUS | Status: DC | PRN
  Administered 2022-01-07 (×2): 20 mg via INTRAVENOUS

## 2022-01-07 MED ORDER — PHENYLEPHRINE HCL-NACL 20-0.9 MG/250ML-% IV SOLN
INTRAVENOUS | Status: DC | PRN
Start: 2022-01-07 — End: 2022-01-07
  Administered 2022-01-07: 25 ug/min via INTRAVENOUS

## 2022-01-07 MED ORDER — VITAMIN D 25 MCG (1000 UNIT) PO TABS: 5000.0000 [IU] | ORAL_TABLET | Freq: Every day | ORAL | Status: DC

## 2022-01-07 MED ORDER — LORAZEPAM BOLUS VIA INFUSION: 0.5000 mg | Freq: Once | INTRAVENOUS | Status: DC

## 2022-01-07 MED ORDER — POVIDONE-IODINE 10 % EX SWAB
2.0000 "application " | Freq: Once | CUTANEOUS | Status: AC
  Administered 2022-01-07: 2 via TOPICAL

## 2022-01-07 MED ORDER — METHOCARBAMOL 500 MG PO TABS: 500.0000 mg | ORAL_TABLET | Freq: Four times a day (QID) | ORAL | Status: DC | PRN

## 2022-01-07 MED ORDER — SODIUM CHLORIDE 0.9 % IV SOLN: 10.0000 mg | Freq: Once | INTRAVENOUS | Status: DC

## 2022-01-07 MED ORDER — ONDANSETRON HCL 4 MG PO TABS: 4.0000 mg | ORAL_TABLET | Freq: Four times a day (QID) | ORAL | Status: DC | PRN

## 2022-01-07 MED ORDER — CEFAZOLIN SODIUM-DEXTROSE 2-4 GM/100ML-% IV SOLN
2.0000 g | INTRAVENOUS | Status: AC
  Administered 2022-01-07: 2 g via INTRAVENOUS
  Filled 2022-01-07: qty 100

## 2022-01-07 MED ORDER — FENTANYL CITRATE PF 50 MCG/ML IJ SOSY: 25.0000 ug | PREFILLED_SYRINGE | INTRAMUSCULAR | Status: DC | PRN

## 2022-01-07 MED ORDER — ONDANSETRON HCL 4 MG/2ML IJ SOLN
INTRAMUSCULAR | Status: DC | PRN
  Administered 2022-01-07: 4 mg via INTRAVENOUS

## 2022-01-07 MED ORDER — DEXAMETHASONE SODIUM PHOSPHATE 10 MG/ML IJ SOLN
8.0000 mg | Freq: Once | INTRAMUSCULAR | Status: AC
  Administered 2022-01-07: 8 mg via INTRAVENOUS

## 2022-01-07 MED ORDER — TRAMADOL HCL 50 MG PO TABS
50.0000 mg | ORAL_TABLET | Freq: Four times a day (QID) | ORAL | Status: DC
  Administered 2022-01-07 – 2022-01-08 (×3): 50 mg via ORAL
  Filled 2022-01-07 (×3): qty 1

## 2022-01-07 MED ORDER — PROPOFOL 500 MG/50ML IV EMUL
INTRAVENOUS | Status: DC | PRN
  Administered 2022-01-07: 25 ug/kg/min via INTRAVENOUS

## 2022-01-07 MED ORDER — VERAPAMIL HCL ER 240 MG PO TBCR
240.0000 mg | EXTENDED_RELEASE_TABLET | Freq: Every day | ORAL | Status: DC
  Filled 2022-01-07 (×2): qty 1

## 2022-01-07 MED ORDER — ALUM & MAG HYDROXIDE-SIMETH 200-200-20 MG/5ML PO SUSP
30.0000 mL | ORAL | Status: DC | PRN
  Filled 2022-01-07: qty 30

## 2022-01-07 MED ORDER — ACETAMINOPHEN 325 MG PO TABS: 325.0000 mg | ORAL_TABLET | Freq: Four times a day (QID) | ORAL | Status: DC | PRN

## 2022-01-07 MED ORDER — DOCUSATE SODIUM 100 MG PO CAPS
100.0000 mg | ORAL_CAPSULE | Freq: Two times a day (BID) | ORAL | Status: DC
  Administered 2022-01-08: 100 mg via ORAL
  Filled 2022-01-07: qty 1

## 2022-01-07 MED ORDER — METHOCARBAMOL 500 MG IVPB - SIMPLE MED
500.0000 mg | Freq: Four times a day (QID) | INTRAVENOUS | Status: DC | PRN
  Administered 2022-01-07: 500 mg via INTRAVENOUS
  Filled 2022-01-07: qty 500
  Filled 2022-01-07: qty 50

## 2022-01-07 MED ORDER — SUCCINYLCHOLINE CHLORIDE 200 MG/10ML IV SOSY
PREFILLED_SYRINGE | INTRAVENOUS | Status: DC | PRN
Start: 2022-01-07 — End: 2022-01-07
  Administered 2022-01-07: 100 mg via INTRAVENOUS

## 2022-01-07 MED ORDER — LACTATED RINGERS IV SOLN: INTRAVENOUS | Status: DC | PRN

## 2022-01-07 MED ORDER — BUPIVACAINE LIPOSOME 1.3 % IJ SUSP
INTRAMUSCULAR | Status: AC
  Filled 2022-01-07: qty 10

## 2022-01-07 MED ORDER — MENTHOL 3 MG MT LOZG: 1.0000 | LOZENGE | OROMUCOSAL | Status: DC | PRN

## 2022-01-07 MED ORDER — ACETAMINOPHEN 500 MG PO TABS
1000.0000 mg | ORAL_TABLET | Freq: Once | ORAL | Status: AC
  Administered 2022-01-07: 1000 mg via ORAL
  Filled 2022-01-07: qty 2

## 2022-01-07 MED ORDER — SODIUM CHLORIDE FLUSH 0.9 % IV SOLN
INTRAVENOUS | Status: DC | PRN
  Administered 2022-01-07: 10 mL

## 2022-01-07 MED ORDER — CEFAZOLIN SODIUM-DEXTROSE 1-4 GM/50ML-% IV SOLN
1.0000 g | Freq: Four times a day (QID) | INTRAVENOUS | Status: AC
  Administered 2022-01-07 (×2): 1 g via INTRAVENOUS
  Filled 2022-01-07 (×2): qty 50

## 2022-01-07 MED ORDER — LIDOCAINE 2% (20 MG/ML) 5 ML SYRINGE
INTRAMUSCULAR | Status: DC | PRN
  Administered 2022-01-07: 60 mg via INTRAVENOUS

## 2022-01-07 MED ORDER — DEXAMETHASONE SODIUM PHOSPHATE 10 MG/ML IJ SOLN
INTRAMUSCULAR | Status: AC
  Filled 2022-01-07: qty 1

## 2022-01-07 MED ORDER — LACTATED RINGERS IV SOLN: INTRAVENOUS | Status: DC

## 2022-01-07 MED ORDER — 0.9 % SODIUM CHLORIDE (POUR BTL) OPTIME
TOPICAL | Status: DC | PRN
  Administered 2022-01-07: 1000 mL

## 2022-01-07 MED ORDER — ACETAMINOPHEN 500 MG PO TABS: 1000.0000 mg | ORAL_TABLET | Freq: Once | ORAL | Status: DC

## 2022-01-07 MED ORDER — DIPHENHYDRAMINE HCL 12.5 MG/5ML PO ELIX: 12.5000 mg | ORAL_SOLUTION | ORAL | Status: DC | PRN

## 2022-01-07 MED ORDER — CHLORHEXIDINE GLUCONATE 0.12 % MT SOLN
15.0000 mL | Freq: Once | OROMUCOSAL | Status: AC
  Administered 2022-01-07: 15 mL via OROMUCOSAL

## 2022-01-07 MED ORDER — BUPIVACAINE LIPOSOME 1.3 % IJ SUSP
INTRAMUSCULAR | Status: DC | PRN
  Administered 2022-01-07: 10 mL

## 2022-01-07 MED ORDER — LORAZEPAM 2 MG/ML IJ SOLN
0.5000 mg | Freq: Once | INTRAMUSCULAR | Status: AC
  Administered 2022-01-07: 0.5 mg via INTRAVENOUS
  Filled 2022-01-07: qty 1

## 2022-01-07 MED ORDER — BUPIVACAINE LIPOSOME 1.3 % IJ SUSP: 10.0000 mL | Freq: Once | INTRAMUSCULAR | Status: DC

## 2022-01-07 MED ORDER — LISINOPRIL 5 MG PO TABS: 2.5000 mg | ORAL_TABLET | Freq: Every evening | ORAL | Status: DC

## 2022-01-07 MED ORDER — POVIDONE-IODINE 10 % EX SWAB: 2.0000 "application " | Freq: Once | CUTANEOUS | Status: DC

## 2022-01-07 MED ORDER — ONDANSETRON HCL 4 MG/2ML IJ SOLN: 4.0000 mg | Freq: Once | INTRAMUSCULAR | Status: DC | PRN

## 2022-01-07 MED ORDER — BISACODYL 10 MG RE SUPP: 10.0000 mg | Freq: Every day | RECTAL | Status: DC | PRN

## 2022-01-07 MED ORDER — ACETAMINOPHEN 500 MG PO TABS
500.0000 mg | ORAL_TABLET | Freq: Four times a day (QID) | ORAL | Status: AC
  Administered 2022-01-07 – 2022-01-08 (×2): 500 mg via ORAL
  Filled 2022-01-07 (×3): qty 1

## 2022-01-07 MED ORDER — POLYETHYLENE GLYCOL 3350 17 G PO PACK: 17.0000 g | PACK | Freq: Every day | ORAL | Status: DC | PRN

## 2022-01-07 MED ORDER — PHENOL 1.4 % MT LIQD: 1.0000 | OROMUCOSAL | Status: DC | PRN

## 2022-01-07 MED ORDER — METOCLOPRAMIDE HCL 5 MG PO TABS: 5.0000 mg | ORAL_TABLET | Freq: Three times a day (TID) | ORAL | Status: DC | PRN

## 2022-01-07 MED ORDER — HYDROCODONE-ACETAMINOPHEN 5-325 MG PO TABS: 1.0000 | ORAL_TABLET | ORAL | Status: DC | PRN

## 2022-01-07 MED ORDER — SODIUM CHLORIDE (PF) 0.9 % IJ SOLN
INTRAMUSCULAR | Status: AC
  Filled 2022-01-07: qty 10

## 2022-01-07 MED ORDER — METOCLOPRAMIDE HCL 5 MG/ML IJ SOLN: 5.0000 mg | Freq: Three times a day (TID) | INTRAMUSCULAR | Status: DC | PRN

## 2022-01-07 MED ORDER — PROPOFOL 1000 MG/100ML IV EMUL
INTRAVENOUS | Status: AC
  Filled 2022-01-07: qty 100

## 2022-01-07 MED ORDER — PANTOPRAZOLE SODIUM 40 MG PO TBEC
40.0000 mg | DELAYED_RELEASE_TABLET | Freq: Every day | ORAL | Status: DC
  Administered 2022-01-08: 40 mg via ORAL
  Filled 2022-01-07: qty 1

## 2022-01-07 MED ORDER — TRANEXAMIC ACID-NACL 1000-0.7 MG/100ML-% IV SOLN
1000.0000 mg | INTRAVENOUS | Status: AC
  Administered 2022-01-07: 1000 mg via INTRAVENOUS
  Filled 2022-01-07: qty 100

## 2022-01-07 MED ORDER — HYDROCHLOROTHIAZIDE 12.5 MG PO TABS
12.5000 mg | ORAL_TABLET | Freq: Every day | ORAL | Status: DC
  Filled 2022-01-07: qty 1

## 2022-01-07 MED ORDER — WATER FOR IRRIGATION, STERILE IR SOLN
Status: DC | PRN
  Administered 2022-01-07: 2000 mL

## 2022-01-07 MED ORDER — PROPOFOL 10 MG/ML IV BOLUS
INTRAVENOUS | Status: AC
  Filled 2022-01-07: qty 20

## 2022-01-07 MED ORDER — PHENYLEPHRINE 40 MCG/ML (10ML) SYRINGE FOR IV PUSH (FOR BLOOD PRESSURE SUPPORT)
PREFILLED_SYRINGE | INTRAVENOUS | Status: DC | PRN
  Administered 2022-01-07: 120 ug via INTRAVENOUS

## 2022-01-07 MED ORDER — DEXAMETHASONE SODIUM PHOSPHATE 10 MG/ML IJ SOLN
10.0000 mg | Freq: Once | INTRAMUSCULAR | Status: AC
  Administered 2022-01-08: 10 mg via INTRAVENOUS
  Filled 2022-01-07: qty 1

## 2022-01-07 MED ORDER — APIXABAN 5 MG PO TABS
5.0000 mg | ORAL_TABLET | Freq: Two times a day (BID) | ORAL | Status: DC
  Administered 2022-01-08: 5 mg via ORAL
  Filled 2022-01-07: qty 1

## 2022-01-07 MED ORDER — ORAL CARE MOUTH RINSE: 15.0000 mL | Freq: Once | OROMUCOSAL | Status: AC

## 2022-01-07 MED ORDER — LIDOCAINE HCL (CARDIAC) PF 100 MG/5ML IV SOSY
PREFILLED_SYRINGE | INTRAVENOUS | Status: DC | PRN
  Administered 2022-01-07: 20 mg via INTRAVENOUS

## 2022-01-07 MED ORDER — ONDANSETRON HCL 4 MG/2ML IJ SOLN
INTRAMUSCULAR | Status: AC
  Filled 2022-01-07: qty 2

## 2022-01-07 MED ORDER — TRANEXAMIC ACID-NACL 1000-0.7 MG/100ML-% IV SOLN
1000.0000 mg | Freq: Once | INTRAVENOUS | Status: AC
  Administered 2022-01-07: 1000 mg via INTRAVENOUS
  Filled 2022-01-07: qty 100

## 2022-01-07 MED ORDER — ONDANSETRON HCL 4 MG/2ML IJ SOLN: 4.0000 mg | Freq: Four times a day (QID) | INTRAMUSCULAR | Status: DC | PRN

## 2022-01-07 SURGICAL SUPPLY — 49 items
APL PRP STRL LF DISP 70% ISPRP (MISCELLANEOUS) ×1
BAG COUNTER SPONGE SURGICOUNT (BAG) IMPLANT
BAG SPEC THK2 15X12 ZIP CLS (MISCELLANEOUS) ×1
BAG SPNG CNTER NS LX DISP (BAG)
BAG ZIPLOCK 12X15 (MISCELLANEOUS) ×1 IMPLANT
BLADE SAG 18X100X1.27 (BLADE) ×2 IMPLANT
BLADE SURG SZ10 CARB STEEL (BLADE) ×2 IMPLANT
CHLORAPREP W/TINT 26 (MISCELLANEOUS) ×2 IMPLANT
CLSR STERI-STRIP ANTIMIC 1/2X4 (GAUZE/BANDAGES/DRESSINGS) ×2 IMPLANT
COVER PERINEAL POST (MISCELLANEOUS) ×2 IMPLANT
COVER SURGICAL LIGHT HANDLE (MISCELLANEOUS) ×2 IMPLANT
DRAPE IMP U-DRAPE 54X76 (DRAPES) ×2 IMPLANT
DRAPE STERI IOBAN 125X83 (DRAPES) ×2 IMPLANT
DRAPE U-SHAPE 47X51 STRL (DRAPES) ×4 IMPLANT
DRSG MEPILEX BORDER 4X8 (GAUZE/BANDAGES/DRESSINGS) ×2 IMPLANT
ELECT REM PT RETURN 15FT ADLT (MISCELLANEOUS) ×2 IMPLANT
GLOVE SRG 8 PF TXTR STRL LF DI (GLOVE) ×1 IMPLANT
GLOVE SURG ENC MOIS LTX SZ7.5 (GLOVE) ×2 IMPLANT
GLOVE SURG POLYISO LF SZ7.5 (GLOVE) ×2 IMPLANT
GLOVE SURG SYN 7.5  E (GLOVE) ×2
GLOVE SURG SYN 7.5 E (GLOVE) ×1 IMPLANT
GLOVE SURG SYN 7.5 PF PI (GLOVE) ×1 IMPLANT
GLOVE SURG UNDER POLY LF SZ7.5 (GLOVE) ×2 IMPLANT
GLOVE SURG UNDER POLY LF SZ8 (GLOVE) ×2
GOWN STRL REUS W/TWL LRG LVL3 (GOWN DISPOSABLE) ×2 IMPLANT
GOWN STRL REUS W/TWL XL LVL3 (GOWN DISPOSABLE) ×2 IMPLANT
HEAD BIOLOX HIP 36/-2.5 (Joint) IMPLANT
HIP BIOLOX HD 36/-2.5 (Joint) ×2 IMPLANT
HOLDER FOLEY CATH W/STRAP (MISCELLANEOUS) ×1 IMPLANT
INSERT TRIDENT POLY 36 0DEG (Insert) ×1 IMPLANT
KIT TURNOVER KIT A (KITS) IMPLANT
MANIFOLD NEPTUNE II (INSTRUMENTS) ×2 IMPLANT
NS IRRIG 1000ML POUR BTL (IV SOLUTION) ×2 IMPLANT
PACK ANTERIOR HIP CUSTOM (KITS) ×2 IMPLANT
PROTECTOR NERVE ULNAR (MISCELLANEOUS) ×2 IMPLANT
SCREW HEX LP 6.5X20 (Screw) ×1 IMPLANT
SHELL CLUSTERHOLE ACETABULAR 5 (Shell) ×1 IMPLANT
SPIKE FLUID TRANSFER (MISCELLANEOUS) ×2 IMPLANT
SPONGE T-LAP 18X18 ~~LOC~~+RFID (SPONGE) ×5 IMPLANT
STEM ACCOLADE SZ 6 (Hips) ×1 IMPLANT
STRIP CLOSURE SKIN 1/2X4 (GAUZE/BANDAGES/DRESSINGS) ×1 IMPLANT
SUT MNCRL AB 3-0 PS2 18 (SUTURE) ×2 IMPLANT
SUT VIC AB 0 CT1 36 (SUTURE) ×2 IMPLANT
SUT VIC AB 1 CT1 36 (SUTURE) ×2 IMPLANT
SUT VIC AB 2-0 CT1 27 (SUTURE) ×4
SUT VIC AB 2-0 CT1 TAPERPNT 27 (SUTURE) ×2 IMPLANT
TRAY FOLEY MTR SLVR 16FR STAT (SET/KITS/TRAYS/PACK) ×1 IMPLANT
TUBE SUCTION HIGH CAP CLEAR NV (SUCTIONS) ×2 IMPLANT
WATER STERILE IRR 1000ML POUR (IV SOLUTION) ×4 IMPLANT

## 2022-01-07 SURGICAL SUPPLY — 15 items

## 2022-01-07 NOTE — Op Note (Signed)
01/07/2022  9:14 AM  PATIENT:  Eric Lambert   MRN: [id number]  PRE-OPERATIVE DIAGNOSIS:  OA LEFT HIP  POST-OPERATIVE DIAGNOSIS:  OA LEFT HIP  PROCEDURE:  Procedure(s): TOTAL HIP ARTHROPLASTY ANTERIOR APPROACH  PREOPERATIVE INDICATIONS:    Eric Lambert is an 88 y.o. male who has a diagnosis of <principal problem not specified> and elected for surgical management after failing conservative treatment.  The risks benefits and alternatives were discussed with the patient including but not limited to the risks of nonoperative treatment, versus surgical intervention including infection, bleeding, nerve injury, periprosthetic fracture, the need for revision surgery, dislocation, leg length discrepancy, blood clots, cardiopulmonary complications, morbidity, mortality, among others, and they were willing to proceed.     OPERATIVE REPORT     SURGEON:   [name], MD    ASSISTANT:  Aggie Moats, PA-C, he was present and scrubbed throughout the case, critical for completion in a timely fashion, and for retraction, instrumentation, and closure.     ANESTHESIA:  General    COMPLICATIONS:  None.     COMPONENTS:  [employer]ker acolade fit femur size 6 with a 36 mm -2.5 head ball and an acetabular shell size 52 with a  polyethylene liner    PROCEDURE IN DETAIL:   The patient was met in the holding area and  identified.  The appropriate hip was identified and marked at the operative site.  The patient was then transported to the OR  and  placed under anesthesia per that record.  At that point, the patient was  placed in the supine position and  secured to the operating room table and all bony prominences padded. He received pre-operative antibiotics    The operative lower extremity was prepped from the iliac crest to the distal leg.  Sterile draping was performed.  Time out was performed prior to incision.      Skin incision was made just 2 cm lateral to the ASIS  extending in  line with the tensor fascia lata. Electrocautery was used to control all bleeders. I dissected down sharply to the fascia of the tensor fascia lata was confirmed that the muscle fibers beneath were running posteriorly. I then incised the fascia over the superficial tensor fascia lata in line with the incision. The fascia was elevated off the anterior aspect of the muscle the muscle was retracted posteriorly and protected throughout the case. I then used electrocautery to incise the tensor fascia lata fascia control and all bleeders. Immediately visible was the fat over top of the anterior neck and capsule.  I removed the anterior fat from the capsule and elevated the rectus muscle off of the anterior capsule. I then removed a large time of capsule. The retractors were then placed over the anterior acetabulum as well as around the superior and inferior neck.  I then made a femoral neck cut. Then used the power corkscrew to remove the femoral head from the acetabulum and thoroughly irrigated the acetabulum. I sized the femoral head.    I then exposed the deep acetabulum, cleared out any tissue including the ligamentum teres.   After adequate visualization, I excised the labrum, and then sequentially reamed.  I then impacted the acetabular implant into place using fluoroscopy for guidance.  Appropriate version and inclination was confirmed clinically matching their bony anatomy, and with fluoroscopy.  I placed a 20 mm screw in the posterior/superio position with an excellent bite.    I then placed the polyethylene liner in  place  I then adducted the leg and released the external rotators from the posterior femur allowing it to be easily delivered up lateral and anterior to the acetabulum for preparation of the femoral canal.    I then prepared the proximal femur using the cookie-cutter and then sequentially reamed and broached.  A trial broach, neck, and head was utilized, and I reduced the hip and used  floroscopy to assess the neck length and femoral implant.  I then impacted the femoral prosthesis into place into the appropriate version. The hip was then reduced and fluoroscopy confirmed appropriate position. Leg lengths were restored.  I then irrigated the hip copiously again with, and repaired the fascia with Vicryl, followed by monocryl for the subcutaneous tissue, Monocryl for the skin, Steri-Strips and sterile gauze. The patient was then awakened and returned to PACU in stable and satisfactory condition. There were no complications.  POST OPERATIVE PLAN: WBAT, DVT px: SCD's/TED, ambulation and chemical dvt px  Edmonia Lynch, MD Orthopedic Surgeon [phone]

## 2022-01-07 NOTE — Progress Notes (Addendum)
° ° °  I was notified by this patient's RN that he was experiencing discomfort in his esophagus and that he was refusing anymore PO medicines. When I came to see him just now, he is sitting up in bed in obvious discomfort and mild distress with a large basin in his lap that he is almost continuously coughing and spitting up clearish saliva and secretions into. Says it is coming through his nose and his mouth. Somewhat foamy in appearance. Audible rhonchi. This has been going on since he took "2 large pain pills" about 4 hours ago. He is concerned the pills are stuck in his known esophageal stricture which he has had 10+ years.   In the past, when something feels stuck it will usually pass within 30 minutes. He reports what he is experiencing now has never happened. He is concerned and becoming anxious. Says he is worried his ability to breathe is going to be affected soon. Current O2 sats are in the 90s. His wife is in the room with him and also very concerned.  Dr. Percell Miller consulted GI who is going to come see the patient and likely take him to endoscopy shortly to try to exam the esophagus and remove any blockages if needed.   They also asked that I order a one time dose of Ativan to help with his increasing distress.   Britt Bottom, PA-C Office [phone] 01/07/2022, 6:54 PM

## 2022-01-07 NOTE — Transfer of Care (Signed)
Immediate Anesthesia Transfer of Care Note  Patient: Eric Lambert [facility]  Procedure(s) Performed: ESOPHAGOGASTRODUODENOSCOPY (EGD) WITH PROPOFOL IMPACTION REMOVAL  Patient Location: PACU  Anesthesia Type:General  Level of Consciousness: awake, alert  and oriented  Airway & Oxygen Therapy: Patient Spontanous Breathing and Patient connected to face mask oxygen  Post-op Assessment: Report given to RN and Post -op Vital signs reviewed and stable  Post vital signs: Reviewed and stable  Last Vitals:  Vitals Value Taken Time  BP    Temp    Pulse    Resp    SpO2      Last Pain:  Vitals:   01/07/22 2104  TempSrc: Oral  PainSc:       Patients Stated Pain Goal: 2 (86/75/44 9201)  Complications: No notable events documented.

## 2022-01-07 NOTE — Discharge Instructions (Signed)

## 2022-01-07 NOTE — Consult Note (Signed)
Referring Provider: Fredonia Highland, MD         Primary Care Physician:  Marin Olp, MD Primary Gastroenterologist: Oretha Caprice, MD           Reason for Consultation: Esophageal food impaction                  ASSESSMENT /  PLAN   88 y.o. male with history of esophageal dysphagia and normal esophagram in 2013, history of atrial fibrillation, pacemaker placement, sleep apnea who was admitted today after left total hip arthroplasty now with esophageal food impaction  1.  Acute esophageal food impaction --unable to tolerate secretions but in no acute distress.  I have recommended urgent upper endoscopy.  Anesthesia available and so will provide general anesthesia.  The nature of the procedure, as well as the risks, benefits, and alternatives were carefully and thoroughly reviewed with the patient. Ample time for discussion and questions allowed. The patient understood, was satisfied, and agreed to proceed.      HPI:     Eric Lambert is a 88 y.o. male with history of esophageal dysphagia and normal esophagram in 2013, history of atrial fibrillation, pacemaker placement, sleep apnea who was admitted today for total hip arthroplasty.  Surgery uneventful.  Earlier this evening after taking Tylenol pills he was unable to swallow further liquids.  Since this time he has been unable to handle his secretions.  He is not having chest pain or shortness of breath.  Initially he was anxious and he got a small dose of IV Ativan.  He denies abdominal pain.  He reports that intermittently over the last multiple months and perhaps over a year he has had intermittent solid food dysphagia.  At times food will stop in his chest but usually will pass without complications within 30 minutes.  Occasion he will have to bring this up and vomit out food.   Past Medical History:  Diagnosis Date   Allergy    Arthritis    Atrial fibrillation -permanent    BPH (benign prostatic hyperplasia)    Cancer  (HCC)    HX OF SKIN CANCER    CHF (congestive heart failure) ([place])    resolved after pacemaker - tachycardia induced   Complete heart block (HCC)    Dyspnea    mild   ED (erectile dysfunction)    GERD (gastroesophageal reflux disease)    GLUCOSE INTOLERANCE 10/22/2007   no recent issues   Heart murmur    OSA (obstructive sleep apnea)    NO CPAP    Pacemaker BSX    dual   Pneumonia    HX OF SEVERAL TIMES AS A CHILD    PONV (postoperative nausea and vomiting)    at age 23    Presence of permanent cardiac pacemaker    SUBACUTE BACTERIAL ENDOCARDITIS 1970s    Past Surgical History:  Procedure Laterality Date   INGUINAL HERNIA REPAIR Bilateral 04/08/2021   Procedure: LAPAROSCOPIC BILATERAL INGUINAL HERNIA REPAIR WITH MESH;  Surgeon: Kinsinger, Arta Bruce, MD;  Location: [facility];  Service: General;  Laterality: Bilateral;   INSERT / REPLACE / REMOVE PACEMAKER     PACEMAKER GENERATOR CHANGE N/A 05/07/2012   Procedure: PACEMAKER GENERATOR CHANGE;  Surgeon: [name], MD;  Location: [facility] CATH LAB;  Service: Cardiovascular;  Laterality: N/A;   PACEMAKER PLACEMENT     PARTIAL HIP ARTHROPLASTY     2008    Prior to Admission medications   Medication Sig  Start Date End Date Taking? Authorizing Provider  acetaminophen (TYLENOL) 500 MG tablet Take 1,000 mg by mouth every 8 (eight) hours as needed for moderate pain.   Yes [provider]  apixaban (ELIQUIS) 5 MG TABS tablet Take 1 tablet (5 mg total) by mouth 2 (two) times daily. 09/05/21  Yes [name], MD  Cholecalciferol (VITAMIN D) 125 MCG (5000 UT) CAPS Take 5,000 Units by mouth in the morning.   Yes [provider]  hydrochlorothiazide (HYDRODIURIL) 12.5 MG tablet Take 12.5 mg by mouth in the morning.   Yes [provider]  lisinopril (ZESTRIL) 2.5 MG tablet Take 1 tablet (2.5 mg total) by mouth daily. Do not fill until patient calls please. Patient taking differently: Take 2.5 mg by mouth every  evening. Do not fill until patient calls please. 08/12/21  Yes Marin Olp, MD  Multiple Vitamins-Minerals ([place] PO) Take 1 tablet by mouth in the morning and at bedtime.   Yes [provider]  terbinafine (LAMISIL AT) 1 % cream Apply 1 application topically 2 (two) times daily. Patient taking differently: Apply 1 application topically 2 (two) times daily as needed (skin irritaion/rash.). 11/11/21  Yes Marin Olp, MD  verapamil (CALAN-SR) 240 MG CR tablet TAKE 1 TABLET BY MOUTH ONCE DAILY * KEEP UPCOMING APPOINTMENT* Patient taking differently: 240 mg at bedtime. 05/06/21  Yes [name], MD    Current Facility-Administered Medications  Medication Dose Route Frequency Provider Last Rate Last Admin   [START ON 01/08/2022] acetaminophen (TYLENOL) tablet 325-650 mg  325-650 mg Oral Q6H PRN Gawne, Meghan M, PA-C       acetaminophen (TYLENOL) tablet 500 mg  500 mg Oral Q6H Aggie Moats M, PA-C   500 mg at 01/07/22 1330   [MAR Hold] alum & mag hydroxide-simeth (MAALOX/MYLANTA) 200-200-20 MG/5ML suspension 30 mL  30 mL Oral Q4H PRN Aggie Moats M, PA-C       [MAR Hold] apixaban (ELIQUIS) tablet 5 mg  5 mg Oral BID Gawne, Meghan M, PA-C       [facility] Hold] bisacodyl (DULCOLAX) suppository 10 mg  10 mg Rectal Daily PRN Aggie Moats M, PA-C       [MAR Hold] cholecalciferol (VITAMIN D3) tablet 5,000 Units  5,000 Units Oral Daily Aggie Moats M, PA-C       [facility] Hold] dexamethasone (DECADRON) injection 10 mg  10 mg Intravenous Once Gawne, Meghan M, PA-C       [facility] Hold] diphenhydrAMINE (BENADRYL) 12.5 MG/5ML elixir 12.5-25 mg  12.5-25 mg Oral Q4H PRN Aggie Moats M, PA-C       [facility] Hold] docusate sodium (COLACE) capsule 100 mg  100 mg Oral BID Gawne, Meghan M, PA-C       [MAR Hold] hydrochlorothiazide (HYDRODIURIL) tablet 12.5 mg  12.5 mg Oral Daily Gawne, Meghan M, PA-C       HYDROcodone-acetaminophen (NORCO) 7.5-325 MG per tablet 1-2 tablet  1-2 tablet Oral  Q4H PRN Gawne, Meghan M, PA-C       HYDROcodone-acetaminophen (NORCO/VICODIN) 5-325 MG per tablet 1-2 tablet  1-2 tablet Oral Q4H PRN Britt Bottom, PA-C       [MAR Hold] lisinopril (ZESTRIL) tablet 2.5 mg  2.5 mg Oral QPM Aggie Moats M, PA-C       [MAR Hold] menthol-cetylpyridinium (CEPACOL) lozenge 3 mg  1 lozenge Oral PRN Aggie Moats M, PA-C       Or   [MAR Hold] phenol (CHLORASEPTIC) mouth spray 1 spray  1 spray Mouth/Throat PRN Aggie Moats M, PA-C       methocarbamol (ROBAXIN) tablet 500 mg  500 mg Oral Q6H PRN Gawne, Meghan M, PA-C       Or   methocarbamol (ROBAXIN) 500 mg in dextrose 5 % 50 mL IVPB  500 mg Intravenous Q6H PRN Aggie Moats M, PA-C   Stopped at 01/07/22 1831   metoCLOPramide (REGLAN) tablet 5-10 mg  5-10 mg Oral Q8H PRN Gawne, Meghan M, PA-C       Or   metoCLOPramide (REGLAN) injection 5-10 mg  5-10 mg Intravenous Q8H PRN Gawne, Meghan M, PA-C       ondansetron (ZOFRAN) tablet 4 mg  4 mg Oral Q6H PRN Gawne, Meghan M, PA-C       Or   ondansetron (ZOFRAN) injection 4 mg  4 mg Intravenous Q6H PRN Aggie Moats M, PA-C       [MAR Hold] pantoprazole (PROTONIX) EC tablet 40 mg  40 mg Oral Daily Gawne, Meghan M, PA-C       [facility] Hold] polyethylene glycol (MIRALAX / GLYCOLAX) packet 17 g  17 g Oral Daily PRN Gawne, Meghan M, PA-C       traMADol (ULTRAM) tablet 50 mg  50 mg Oral Q6H Gawne, Meghan M, PA-C   50 mg at 01/07/22 1331   [MAR Hold] verapamil (CALAN-SR) CR tablet 240 mg  240 mg Oral QHS Gawne, Meghan M, PA-C        Allergies as of 11/06/2021   (No Known Allergies)    Family History  Problem Relation Age of Onset   Prostate cancer Father    Heart disease Other        mothers side men- strokes and heart attacks   Diabetes Brother    Prostate cancer Brother    Brain cancer Brother     Social History   Tobacco Use   Smoking status: Never   Smokeless tobacco: Never  Vaping Use   Vaping Use: Never used  Substance Use Topics   Alcohol use: Not  Currently    Comment: hx of 10-15 years ago    Drug use: No    Review of Systems: All systems reviewed and negative except where noted in HPI.  Physical Exam: Vital signs in last 24 hours: Temp:  [96.7 F (35.9 C)-98.9 F (37.2 C)] 98.9 F (37.2 C) (02/14 2104) Pulse Rate:  [60-98] 98 (02/14 2104) Resp:  [10-18] 16 (02/14 2104) BP: (94-142)/(53-88) 130/67 (02/14 2104) SpO2:  [91 %-100 %] 91 % (02/14 2104) Weight:  [69.4 kg] 69.4 kg (02/14 0541) Last BM Date : 01/07/22 Gen: awake, alert, NAD HEENT: anicteric CV: RRR, no mrg Pulm: Scattered rhonchi but no rales or wheezing Abd: soft, NT/ND, +BS throughout Ext: no c/c/e Neuro: nonfocal  CBC Latest Ref Rng & Units 12/26/2021 11/11/2021 07/18/2021  WBC 4.0 - 10.5 K/uL 5.0 5.6 7.6  Hemoglobin 13.0 - 17.0 g/dL 13.9 13.1 13.9  Hematocrit 39.0 - 52.0 % 41.8 39.2 41.8  Platelets 150 - 400 K/uL 139(L) 140.0(L) 158.0    . CMP Latest Ref Rng & Units 12/26/2021 11/11/2021 07/18/2021  Glucose 70 - 99 mg/dL 84 81 145(H)  BUN 8 - 23 mg/dL 33(H) 35(H) 41(H)  Creatinine 0.61 - 1.24 mg/dL 1.12 1.02 1.07  Sodium 135 - 145 mmol/L 138 139 137  Potassium 3.5 - 5.1 mmol/L 4.1 5.1 4.3  Chloride 98 - 111 mmol/L 103 103 103  CO2 22 - 32 mmol/L 29 29 26  Calcium 8.9 - 10.3 mg/dL 9.4 9.4 9.3  Total Protein 6.0 - 8.3 g/dL - 6.4 6.3  Total Bilirubin 0.2 - 1.2 mg/dL - 0.6 0.7  Alkaline Phos 39 - 117 U/L - 65 61  AST 0 - 37 U/L - 13 12  ALT 0 - 53 U/L - 11 10   Studies/Results: DG C-Arm 1-60 Min-No Report  Result Date: 01/07/2022 Fluoroscopy was utilized by the requesting physician.  No radiographic interpretation.   DG C-Arm 1-60 Min-No Report  Result Date: 01/07/2022 Fluoroscopy was utilized by the requesting physician.  No radiographic interpretation.   DG HIP OPERATIVE UNILAT W OR W/O PELVIS LEFT  Result Date: 01/07/2022 CLINICAL DATA:  Left hip replacement   FT 10 seconds EXAM: OPERATIVE LEFT HIP (WITH PELVIS IF PERFORMED) 2 VIEWS  TECHNIQUE: Fluoroscopic spot image(s) were submitted for interpretation post-operatively. COMPARISON:  None. FINDINGS: Total hip arthroplasty. Prosthetic components are well seated. No fracture or dislocation. External tubing node IMPRESSION: Total hip arthroplasty. Electronically Signed   By: Suzy Bouchard M.D.   On: 01/07/2022 09:32    Principal Problem:   S/P total left hip arthroplasty    Eric Lambert. Araceli Arango, M.D. @  01/07/2022, 10:11 PM

## 2022-01-07 NOTE — Op Note (Signed)
[facility] Patient Name: Eric Lambert Procedure Date: 01/07/2022 MRN: [id number] Attending MD: Jerene Bears , MD Date of Birth: 1929/07/13 CSN: [id number] Age: 88 Admit Type: Inpatient Procedure:                Upper GI endoscopy Indications:              Foreign body in the esophagus Providers:                Lajuan Lines. Hilarie Fredrickson, MD, Vista Lawman, RN, Cletis Athens,                            Technician, Herbie Drape, CRNA Referring MD:             Ernesta Amble. Percell Miller, MD Medicines:                General Anesthesia Complications:            No immediate complications. Estimated Blood Loss:     Estimated blood loss: none. Procedure:                Pre-Anesthesia Assessment:                           - Prior to the procedure, a History and Physical                            was performed, and patient medications and                            allergies were reviewed. The patient's tolerance of                            previous anesthesia was also reviewed. The risks                            and benefits of the procedure and the sedation                            options and risks were discussed with the patient.                            All questions were answered, and informed consent                            was obtained. Prior Anticoagulants: The patient has                            taken Eliquis (apixaban), last dose was 4 days                            prior to procedure. ASA Grade Assessment: III - A                            patient with severe systemic disease. After  reviewing the risks and benefits, the patient was                            deemed in satisfactory condition to undergo the                            procedure.                           After obtaining informed consent, the endoscope was                            passed under direct vision. Throughout the                            procedure, the patient's  blood pressure, pulse, and                            oxygen saturations were monitored continuously. The                            GIF-H190 ([id number] Olympus endoscope was introduced                            through the mouth, and advanced to the duodenal                            bulb. The upper GI endoscopy was accomplished                            without difficulty. The patient tolerated the                            procedure well. Scope In: Scope Out: Findings:      Food was found in the middle third of the esophagus and in the lower       third of the esophagus. Removal of food was accomplished with talon       grasper, Jabier Mutton net and near the end advancing remaining food debris into       the proximal stomach. The esophagus was completely cleared at the end of       the case.      Moderately severe esophagitis with no bleeding was found diffusely       (reflux and likely some trauma from stasis).      One benign-appearing, intrinsic moderate (circumferential scarring or       stenosis; an endoscope may pass) stenosis was found 45 cm from the       incisors (at GE junction). This stenosis measured 8 mm (inner diameter)       x less than one cm (in length). The stenosis was traversed and       thereafter there was a mucosal rent with self-limited bleeding at the       stricture.      No gross lesions were noted in the stomach. Fully views obscured by       food/fluid.      The duodenal  bulb was normal. Impression:               - Food in the middle third of the esophagus and in                            the lower third of the esophagus. Removal was                            successful.                           - Moderate esophagitis with no bleeding.                           - Benign-appearing esophageal stenosis at GE                            junction.                           - No gross lesions in the stomach.                           - Normal duodenal  bulb. Moderate Sedation:      N/A Recommendation:           - Return patient to hospital ward for ongoing care.                           - Clear liquid diet. As tolerated diet can be                            advanced but would not advance past soft foods.                           - Continue present medications.                           - BID PPI recommended x 4 weeks, then once daily                            thereafter.                           - Will arrange GI follow-up as I expect the patient                            would benefit from repeat outpatient EGD with                            dilation.                           - GI team will be available if needed while  hospitalized, please call if questions. Procedure Code(s):        --- Professional ---                           [phone], Esophagogastroduodenoscopy, flexible,                            transoral; with removal of foreign body(s) Diagnosis Code(s):        --- Professional ---                           V03.500X, Food in esophagus causing other injury,                            initial encounter                           K20.90, Esophagitis, unspecified without bleeding                           K22.2, Esophageal obstruction                           T18.108A, Unspecified foreign body in esophagus                            causing other injury, initial encounter CPT copyright 2019 [employer] Medical [employer]. All rights reserved. The codes documented in this report are preliminary and upon coder review may  be revised to meet current compliance requirements. Jerene Bears, MD 01/07/2022 10:57:42 PM This report has been signed electronically. Number of Addenda: 0

## 2022-01-07 NOTE — Interval H&P Note (Signed)
History and Physical Interval Note:  01/07/2022 7:01 AM  Eric Lambert  has presented today for surgery, with the diagnosis of OA LEFT HIP.  The various methods of treatment have been discussed with the patient and family. After consideration of risks, benefits and other options for treatment, the patient has consented to  Procedure(s): TOTAL HIP ARTHROPLASTY ANTERIOR APPROACH (Left) as a surgical intervention.  The patient's history has been reviewed, patient examined, no change in status, stable for surgery.  I have reviewed the patient's chart and labs.  Questions were answered to the patient's satisfaction.     [name]

## 2022-01-07 NOTE — Transfer of Care (Signed)
Immediate Anesthesia Transfer of Care Note  Patient: Eric Lambert [facility]  Procedure(s) Performed: TOTAL HIP ARTHROPLASTY ANTERIOR APPROACH (Left: Hip)  Patient Location: PACU  Anesthesia Type:Spinal  Level of Consciousness: awake, alert  and oriented  Airway & Oxygen Therapy: Patient Spontanous Breathing and Patient connected to face mask oxygen  Post-op Assessment: Report given to RN and Post -op Vital signs reviewed and stable  Post vital signs: Reviewed and stable  Last Vitals:  Vitals Value Taken Time  BP    Temp    Pulse 75 01/07/22 0945  Resp 10 01/07/22 0945  SpO2 99 % 01/07/22 0945  Vitals shown include unvalidated device data.  Last Pain:  Vitals:   01/07/22 0611  TempSrc:   PainSc: 1       Patients Stated Pain Goal: 0 (77/93/96 8864)  Complications: No notable events documented.

## 2022-01-07 NOTE — Progress Notes (Signed)
Patient's coughing had eased mildly but he began coughing agressively again and still producing copious amts of clear secretions from throat/nose. Voice is very gurgly and after coughing patient has to inhale forcefully, Rn concerned about risk of aspiration.  Patient still declines Maalox, states problem is definitely not acid reflux, it's the stricture and food matter is stuck. Requested IV methocarbamol from pharmacy, notified Aggie Moats PA about situation. New orders placed and PA plans on seeing him soon. Oxygen saturation remains over 90%.

## 2022-01-07 NOTE — Anesthesia Procedure Notes (Addendum)
Spinal  Patient location during procedure: OR Start time: 01/07/2022 7:20 AM End time: 01/07/2022 7:30 AM Reason for block: surgical anesthesia Staffing Performed: anesthesiologist  Anesthesiologist: Pervis Hocking, DO Preanesthetic Checklist Completed: patient identified, IV checked, risks and benefits discussed, surgical consent, monitors and equipment checked, pre-op evaluation and timeout performed Spinal Block Patient position: sitting Prep: DuraPrep and site prepped and draped Patient monitoring: cardiac monitor, continuous pulse ox and blood pressure Approach: midline Location: L3-4 Injection technique: single-shot Needle Needle type: Pencan  Needle gauge: 24 G Needle length: 9 cm Assessment Sensory level: T6 Events: CSF return and second provider Additional Notes Functioning IV was confirmed and monitors were applied. Sterile prep and drape, including hand hygiene and sterile gloves were used. The patient was positioned and the spine was prepped. The skin was anesthetized with lidocaine.  Free flow of clear CSF was obtained prior to injecting local anesthetic into the CSF.  The spinal needle aspirated freely following injection.  The needle was carefully withdrawn.  The patient tolerated the procedure well.

## 2022-01-07 NOTE — Anesthesia Postprocedure Evaluation (Signed)
Anesthesia Post Note  Patient: Anthonymichael Munday Teresi  Procedure(s) Performed: TOTAL HIP ARTHROPLASTY ANTERIOR APPROACH (Left: Hip)     Patient location during evaluation: PACU Anesthesia Type: MAC and Spinal Level of consciousness: awake and alert Pain management: pain level controlled Vital Signs Assessment: post-procedure vital signs reviewed and stable Respiratory status: spontaneous breathing, nonlabored ventilation and respiratory function stable Cardiovascular status: blood pressure returned to baseline and stable Postop Assessment: no apparent nausea or vomiting, spinal receding, no backache and no headache Anesthetic complications: no   No notable events documented.  Last Vitals:  Vitals:   01/07/22 0956 01/07/22 1000  BP:  104/60  Pulse: 64 68  Resp: 16 10  Temp:  (!) 35.9 C  SpO2: 96% 97%    Last Pain:  Vitals:   01/07/22 1000  TempSrc:   PainSc: 0-No pain                 Pervis Hocking

## 2022-01-07 NOTE — Anesthesia Procedure Notes (Signed)
Procedure Name: MAC Date/Time: 01/07/2022 7:20 AM Performed by: Maxwell Caul, CRNA Pre-anesthesia Checklist: Patient identified, Emergency Drugs available, Suction available and Patient being monitored Oxygen Delivery Method: Simple face mask

## 2022-01-07 NOTE — Progress Notes (Addendum)
Called into room by patient's wife, states "He's choking." Upon arrival to room, patient is sitting up in bed coughing. Patient able to talk, states that he has acid reflux that causes spasms and also a narrowing in his esophagus and sometimes if he takes too big of a bite of something it will get stuck. Patient is speaking clearly, oxygen saturation at 94% on room air but strong wet cough noted, producing copious amts of clear thick foamy phlegm. Patient states at home these episodes do not last more than 30 minutes. Set up patient with oral suction to assist w/ secretions. Patient declined maalox for acid reflux at this time, does not want to attempt to swallow anything else. Will monitor closely for distress/needs.

## 2022-01-07 NOTE — Anesthesia Preprocedure Evaluation (Signed)
Anesthesia Evaluation  Patient identified by MRN, date of birth, ID band Patient awake    Reviewed: Allergy & Precautions, NPO status , Patient's Chart, lab work & pertinent test results  Airway Mallampati: II  TM Distance: >3 FB Neck ROM: Full    Dental no notable dental hx.    Pulmonary sleep apnea ,    Pulmonary exam normal breath sounds clear to auscultation       Cardiovascular hypertension, Normal cardiovascular exam+ dysrhythmias Atrial Fibrillation + pacemaker + Valvular Problems/Murmurs MR  Rhythm:Regular Rate:Normal  1. Left ventricular ejection fraction, by estimation, is 55 to 60%. The  left ventricle has normal function. The left ventricle has no regional  wall motion abnormalities. Left ventricular diastolic parameters are  indeterminate.  2. Right ventricular systolic function is normal. The right ventricular  size is normal. There is normal pulmonary artery systolic pressure. The  estimated right ventricular systolic pressure is 14.9 mmHg.  3. Left atrial size was severely dilated.  4. Right atrial size was mild to moderately dilated.  5. The mitral valve is abnormal with mild bileaflet prolapse. Mild mitral  valve regurgitation. No evidence of mitral stenosis.  6. The aortic valve is tricuspid. Aortic valve regurgitation is not  visualized. Mild aortic valve sclerosis is present, with no evidence of  aortic valve stenosis.  7. The inferior vena cava is normal in size with <50% respiratory  variability, suggesting right atrial pressure of 8 mmHg.  8. The patient was in atrial fibrillation.    Neuro/Psych negative neurological ROS  negative psych ROS   GI/Hepatic Neg liver ROS, GERD  ,  Endo/Other  negative endocrine ROS  Renal/GU negative Renal ROS  negative genitourinary   Musculoskeletal negative musculoskeletal ROS (+)   Abdominal   Peds negative pediatric ROS (+)  Hematology negative  hematology ROS (+)   Anesthesia Other Findings   Reproductive/Obstetrics negative OB ROS                             Anesthesia Physical Anesthesia Plan  ASA: 3 and emergent  Anesthesia Plan: General   Post-op Pain Management: Minimal or no pain anticipated   Induction: Intravenous and Rapid sequence  PONV Risk Score and Plan: 2 and Ondansetron and Treatment may vary due to age or medical condition  Airway Management Planned: Oral ETT  Additional Equipment:   Intra-op Plan:   Post-operative Plan: Extubation in OR  Informed Consent: I have reviewed the patients History and Physical, chart, labs and discussed the procedure including the risks, benefits and alternatives for the proposed anesthesia with the patient or authorized representative who has indicated his/her understanding and acceptance.     Dental advisory given  Plan Discussed with: CRNA and Surgeon  Anesthesia Plan Comments:         Anesthesia Quick Evaluation

## 2022-01-07 NOTE — Progress Notes (Signed)
PT Cancellation Note  Patient Details Name: Eric Lambert MRN: [id number] DOB: 12/18/1928   Cancelled Treatment:    Reason Eval/Treat Not Completed: Medical issues which prohibited therapy--pt experiencing bad reflux currently-unable to participate at this time. Will check back later to see if its any better.    [facility]  Office: [phone] Pager: [phone]

## 2022-01-07 NOTE — Progress Notes (Signed)
PT Cancellation Note  Patient Details Name: Eric Lambert MRN: [id number] DOB: [birth year]   Cancelled Treatment:    Reason Eval/Treat Not Completed: Medical issues which prohibited therapy--pt is still experiencing bad reflux-he declines participation with PT on today. Will check back on tomorrow.    [facility]  Office: [phone] Pager: [phone]

## 2022-01-07 NOTE — Anesthesia Procedure Notes (Signed)
Procedure Name: Intubation Date/Time: 01/07/2022 10:23 PM Performed by: Gean Maidens, CRNA Pre-anesthesia Checklist: Patient identified, Emergency Drugs available, Suction available, Patient being monitored and Timeout performed Patient Re-evaluated:Patient Re-evaluated prior to induction Oxygen Delivery Method: Circle system utilized Preoxygenation: Pre-oxygenation with 100% oxygen Induction Type: IV induction and Rapid sequence Laryngoscope Size: Mac and 4 Grade View: Grade I Tube type: Oral Tube size: 7.5 mm Number of attempts: 1 Airway Equipment and Method: Stylet Placement Confirmation: ETT inserted through vocal cords under direct vision, positive ETCO2 and breath sounds checked- equal and bilateral Secured at: 23 cm Tube secured with: Tape Dental Injury: Teeth and Oropharynx as per pre-operative assessment

## 2022-01-08 ENCOUNTER — Encounter (HOSPITAL_COMMUNITY): Payer: Self-pay | Admitting: Internal Medicine

## 2022-01-08 ENCOUNTER — Telehealth: Payer: Self-pay

## 2022-01-08 DIAGNOSIS — K2100 Gastro-esophageal reflux disease with esophagitis, without bleeding: Secondary | ICD-10-CM | POA: Diagnosis not present

## 2022-01-08 DIAGNOSIS — M1612 Unilateral primary osteoarthritis, left hip: Secondary | ICD-10-CM | POA: Diagnosis not present

## 2022-01-08 DIAGNOSIS — Z950 Presence of cardiac pacemaker: Secondary | ICD-10-CM | POA: Diagnosis not present

## 2022-01-08 DIAGNOSIS — T18128A Food in esophagus causing other injury, initial encounter: Secondary | ICD-10-CM | POA: Diagnosis not present

## 2022-01-08 DIAGNOSIS — I10 Essential (primary) hypertension: Secondary | ICD-10-CM | POA: Diagnosis not present

## 2022-01-08 DIAGNOSIS — G4730 Sleep apnea, unspecified: Secondary | ICD-10-CM | POA: Diagnosis not present

## 2022-01-08 DIAGNOSIS — Z79899 Other long term (current) drug therapy: Secondary | ICD-10-CM | POA: Diagnosis not present

## 2022-01-08 DIAGNOSIS — K222 Esophageal obstruction: Secondary | ICD-10-CM | POA: Diagnosis not present

## 2022-01-08 DIAGNOSIS — X58XXXA Exposure to other specified factors, initial encounter: Secondary | ICD-10-CM | POA: Diagnosis not present

## 2022-01-08 DIAGNOSIS — I4891 Unspecified atrial fibrillation: Secondary | ICD-10-CM | POA: Diagnosis not present

## 2022-01-08 MED ORDER — HYDROCODONE-ACETAMINOPHEN 10-325 MG PO TABS: 1.0000 | ORAL_TABLET | Freq: Four times a day (QID) | ORAL | 0 refills | Status: DC | PRN

## 2022-01-08 MED ORDER — ONDANSETRON 4 MG PO TBDP: 4.0000 mg | ORAL_TABLET | Freq: Two times a day (BID) | ORAL | 0 refills | Status: DC | PRN

## 2022-01-08 MED ORDER — METHOCARBAMOL 500 MG PO TABS: 500.0000 mg | ORAL_TABLET | Freq: Three times a day (TID) | ORAL | 0 refills | Status: DC | PRN

## 2022-01-08 MED ORDER — DOCUSATE SODIUM 100 MG PO CAPS: 100.0000 mg | ORAL_CAPSULE | Freq: Two times a day (BID) | ORAL | 0 refills | Status: AC | PRN

## 2022-01-08 NOTE — Progress Notes (Signed)
Physical Therapy Treatment Patient Details Name: Eric Lambert MRN: [id number] DOB: 03-20-1929 Today's Date: 01/08/2022   History of Present Illness 88 y.o. male admitted 01/07/22 for L AA-THA. Pt also underwent upper GI endoscopy 01/07/22 to remove food from the esophogus.  PMH includes afib, CHF, pacemaker, inguinal hernia repair 04/08/21, esophogeal stricture.    PT Comments    Pt ambulated 150' with RW and completed stair training with his wife present. He is ready to DC home from a PT standpoint.    Recommendations for follow up therapy are one component of a multi-disciplinary discharge planning process, led by the attending physician.  Recommendations may be updated based on patient status, additional functional criteria and insurance authorization.  Follow Up Recommendations  Home health PT     Assistance Recommended at Discharge Intermittent Supervision/Assistance  Patient can return home with the following Assist for transportation;A little help with bathing/dressing/bathroom;Help with stairs or ramp for entrance   Equipment Recommendations  None recommended by PT    Recommendations for Other Services       Precautions / Restrictions Precautions Precautions: Fall Precaution Comments: pt denies falls in the past 6 months Restrictions Weight Bearing Restrictions: No     Mobility  Bed Mobility Overal bed mobility: Modified Independent             General bed mobility comments: HOB up, used rail    Transfers Overall transfer level: Needs assistance Equipment used: Rolling walker (2 wheels) Transfers: Sit to/from Stand Sit to Stand: Supervision           General transfer comment: VCs hand placement    Ambulation/Gait Ambulation/Gait assistance: Supervision Gait Distance (Feet): 150 Feet Assistive device: Rolling walker (2 wheels) Gait Pattern/deviations: Step-to pattern, Decreased step length - right, Decreased step length - left, Trunk  flexed Gait velocity: decr     General Gait Details: VCs sequencing, no loss of balance, VCs for posture   Stairs Stairs: Yes Stairs assistance: Min guard Stair Management: One rail Left, Step to pattern, Forwards Number of Stairs: 3 General stair comments: 3 steps x 3 trials, L rail with R forearm crutch, ongoing VCs for sequencing, wife present   Wheelchair Mobility    Modified Rankin (Stroke Patients Only)       Balance Overall balance assessment: Modified Independent                                          Cognition Arousal/Alertness: Awake/alert Behavior During Therapy: WFL for tasks assessed/performed Overall Cognitive Status: Within Functional Limits for tasks assessed                                          Exercises Total Joint Exercises Ankle Circles/Pumps: AROM, Both, 10 reps, Supine Quad Sets: AROM, Left, 5 reps, Supine Short Arc Quad: AROM, Left, 5 reps, Supine Heel Slides: AAROM, Left, 5 reps, Supine Hip ABduction/ADduction: AAROM, Left, 5 reps, Supine Long Arc Quad: AROM, Left, 10 reps, Seated    General Comments        Pertinent Vitals/Pain Pain Assessment Pain Assessment: 0-10 Pain Score: 2  Pain Location: L hip Pain Descriptors / Indicators: Sore Pain Intervention(s): Limited activity within patient's tolerance, Monitored during session, Premedicated before session, Ice applied    Home Living Family/patient expects  to be discharged to:: Private residence Living Arrangements: Spouse/significant other Available Help at Discharge: Family;Available 24 hours/day Type of Home: House Home Access: Stairs to enter Entrance Stairs-Rails: Psychiatric nurse of Steps: 3   Home Layout: One level Home Equipment: Conservation officer, nature (2 wheels);Shower seat      Prior Function            PT Goals (current goals can now be found in the care plan section) Acute Rehab PT Goals Patient Stated Goal:  return to independence with mobility PT Goal Formulation: With patient Time For Goal Achievement: 01/15/22 Potential to Achieve Goals: Good Progress towards PT goals: Progressing toward goals    Frequency    7X/week      PT Plan Current plan remains appropriate    Co-evaluation              AM-PAC PT "6 Clicks" Mobility   Outcome Measure  Help needed turning from your back to your side while in a flat bed without using bedrails?: A Little Help needed moving from lying on your back to sitting on the side of a flat bed without using bedrails?: A Little Help needed moving to and from a bed to a chair (including a wheelchair)?: None Help needed standing up from a chair using your arms (e.g., wheelchair or bedside chair)?: None Help needed to walk in hospital room?: None Help needed climbing 3-5 steps with a railing? : None 6 Click Score: 22    End of Session Equipment Utilized During Treatment: Gait belt Activity Tolerance: Patient tolerated treatment well Patient left: in chair;with chair alarm set;with call bell/phone within reach;with family/visitor present Nurse Communication: Mobility status PT Visit Diagnosis: Difficulty in walking, not elsewhere classified (R26.2);Pain Pain - Right/Left: Left Pain - part of body: Hip     Time: 1610-9604 PT Time Calculation (min) (ACUTE ONLY): 23 min  Charges:  $Gait Training: 8-22 mins $Therapeutic Activity: 8-22 mins                    Blondell Reveal Kistler PT 01/08/2022  Acute Rehabilitation Services Pager [phone] Office [phone]

## 2022-01-08 NOTE — Progress Notes (Signed)
° ° °  Subjective: Patient reports pain as mild. Feels much better than last night. GI was able to take him to the endoscopy suite urgently and removed 2 areas of food from his esophagus. After that, he has felt much better and is no longer unable to handle his secretions. He has been placed on a liquid diet. Urinating. No CP, SOB. Has not yet mobilized with PT OOB.   Objective:   VITALS:   Vitals:   01/07/22 2358 01/08/22 0224 01/08/22 0526 01/08/22 0530  BP: (!) 105/48 (!) 117/45 99/77 99/77   Pulse: 100 93 98 98  Resp: 18 18 16    Temp: 99.9 F (37.7 C) (!) 100.4 F (38 C) 99.1 F (37.3 C)   TempSrc: Oral Oral Oral   SpO2: 93% 93% 97%   Weight:      Height:       CBC Latest Ref Rng & Units 12/26/2021 11/11/2021 07/18/2021  WBC 4.0 - 10.5 K/uL 5.0 5.6 7.6  Hemoglobin 13.0 - 17.0 g/dL 13.9 13.1 13.9  Hematocrit 39.0 - 52.0 % 41.8 39.2 41.8  Platelets 150 - 400 K/uL 139(L) 140.0(L) 158.0   BMP Latest Ref Rng & Units 12/26/2021 11/11/2021 07/18/2021  Glucose 70 - 99 mg/dL 84 81 145(H)  BUN 8 - 23 mg/dL 33(H) 35(H) 41(H)  Creatinine 0.61 - 1.24 mg/dL 1.12 1.02 1.07  BUN/Creat Ratio 6 - 22 (calc) - - -  Sodium 135 - 145 mmol/L 138 139 137  Potassium 3.5 - 5.1 mmol/L 4.1 5.1 4.3  Chloride 98 - 111 mmol/L 103 103 103  CO2 22 - 32 mmol/L 29 29 26   Calcium 8.9 - 10.3 mg/dL 9.4 9.4 9.3   Intake/Output      02/14 0701 02/15 0700 02/15 0701 02/16 0700   P.O. 300    I.V. (mL/kg) 2100 (30.3)    Other 110    IV Piggyback 450    Total Intake(mL/kg) 2960 (42.7)    Urine (mL/kg/hr) 3200 (1.9)    Other 200    Blood 250    Total Output 3650    Net -690            Physical Exam: General: NAD.  Sitting up on edge of bed. Looks much better than last night Resp: No increased wob, some occasional coughing Cardio: regular rate and rhythm ABD soft Neurologically intact MSK Neurovascularly intact Sensation intact distally Intact pulses distally Dorsiflexion/Plantar flexion  intact Incision: dressing C/D/I   Assessment: 1 Day Post-Op  S/P LEFT TOTAL HIP ARTHROPLASTY By Dr. Percell Miller on 01/07/22  S/P Procedure(s) (LRB): ESOPHAGOGASTRODUODENOSCOPY (EGD) WITH PROPOFOL (N/A) IMPACTION REMOVAL by Dr. Zenovia Jarred on 01/07/22  Principal Problem:   S/P total left hip arthroplasty Active Problems:   Esophageal obstruction due to food impaction   Plan: Liquid diet. Don't advance beyond soft foods OP f/u with GI GI recommends PPI bid x 4 weeks then daily after that Up with therapy Incentive Spirometry Elevate and Apply ice  Weightbearing: WBAT LLE Insicional and dressing care: Dressings left intact until follow-up and Reinforce dressings as needed Orthopedic device(s): None Showering: Keep dressing dry VTE prophylaxis:  Eliquis  , SCDs, ambulation Pain control: continue current regimen Follow - up plan: 2 weeks Contact information:  Edmonia Lynch MD, Aggie Moats PA-C  Dispo: Home with HHPT already set up via [facility], PA-C Office [phone] 01/08/2022, 8:44 AM

## 2022-01-08 NOTE — Telephone Encounter (Signed)
-----   Message from Jerene Bears, MD sent at 01/07/2022 11:09 PM EST ----- Eric Lambert, on-call procedure for food impaction. Pt will need to follow-up with APP or Dr. Ardis Hughs to discuss outpatient EGD with dilation for esophageal stricture. This occurred while admitted after hip surgery, but he told me he has had solid food dysphagia frequently over the last 1 yr. Thanks JMP.

## 2022-01-08 NOTE — Progress Notes (Signed)
Patient discharged to home w/ family. Given all belongings, instructions. Verbalized understanding of all instructions. Escorted to pov via w/c. Encouraged patient to maintain soft diet per GI MD recommendations.

## 2022-01-08 NOTE — Telephone Encounter (Signed)
Appt has been made for the pt to see Dr Ardis Hughs on 02/11/22 at 1:30 pm.  Letter mailed to the home and sent to My Chart with appt info.

## 2022-01-08 NOTE — Evaluation (Signed)
Physical Therapy Evaluation Patient Details Name: Eric Lambert MRN: [id number] DOB: [birth year] Today's Date: 01/08/2022  History of Present Illness  88 y.o. male admitted 01/07/22 for L AA-THA. Pt also underwent upper GI endoscopy 01/07/22 to remove food from the esophogus.  PMH includes afib, CHF, pacemaker, inguinal hernia repair 04/08/21, esophogeal stricture.  Clinical Impression  Pt is s/p THA resulting in the deficits listed below (see PT Problem List). Pt ambulated 130' with RW, no loss of balance. Instructed pt/spouse in THA HEP, they demonstrate good understanding. Will plan to do stair training this afternoon, I expect pt will be ready to DC home after that.  Pt will benefit from skilled PT to increase their independence and safety with mobility to allow discharge to the venue listed below.         Recommendations for follow up therapy are one component of a multi-disciplinary discharge planning process, led by the attending physician.  Recommendations may be updated based on patient status, additional functional criteria and insurance authorization.  Follow Up Recommendations Home health PT    Assistance Recommended at Discharge Intermittent Supervision/Assistance  Patient can return home with the following  Assist for transportation;A little help with bathing/dressing/bathroom;Help with stairs or ramp for entrance    Equipment Recommendations None recommended by PT  Recommendations for Other Services       Functional Status Assessment Patient has had a recent decline in their functional status and demonstrates the ability to make significant improvements in function in a reasonable and predictable amount of time.     Precautions / Restrictions Precautions Precautions: Fall Precaution Comments: pt denies falls in the past 6 months Restrictions Weight Bearing Restrictions: No      Mobility  Bed Mobility Overal bed mobility: Modified Independent              General bed mobility comments: HOB up, used rail    Transfers Overall transfer level: Needs assistance Equipment used: Rolling walker (2 wheels) Transfers: Sit to/from Stand Sit to Stand: Min guard           General transfer comment: VCs hand placement    Ambulation/Gait Ambulation/Gait assistance: Min guard Gait Distance (Feet): 130 Feet Assistive device: Rolling walker (2 wheels) Gait Pattern/deviations: Step-to pattern, Decreased step length - right, Decreased step length - left, Trunk flexed Gait velocity: decr     General Gait Details: VCs sequencing, no loss of balance, VCs for posture  Stairs            Wheelchair Mobility    Modified Rankin (Stroke Patients Only)       Balance Overall balance assessment: Modified Independent                                           Pertinent Vitals/Pain Pain Assessment Pain Assessment: 0-10 Pain Score: 2  Pain Location: L hip Pain Descriptors / Indicators: Sore Pain Intervention(s): Limited activity within patient's tolerance, Monitored during session, Premedicated before session, Ice applied    Home Living Family/patient expects to be discharged to:: Private residence Living Arrangements: Spouse/significant other Available Help at Discharge: Family;Available 24 hours/day Type of Home: House Home Access: Stairs to enter Entrance Stairs-Rails: Psychiatric nurse of Steps: 3   Home Layout: One level Home Equipment: Conservation officer, nature (2 wheels);Shower seat      Prior Function Prior Level of Function : Independent/Modified Independent  Mobility Comments: walked with cane or RW ADLs Comments: independent     Hand Dominance        Extremity/Trunk Assessment   Upper Extremity Assessment Upper Extremity Assessment: Overall WFL for tasks assessed    Lower Extremity Assessment Lower Extremity Assessment: LLE deficits/detail LLE Deficits / Details: hip  flexion AAROM ~35*, Abduction AAROM ~10*, knee ext 4/5 LLE Sensation: WNL LLE Coordination: WNL    Cervical / Trunk Assessment Cervical / Trunk Assessment: Normal  Communication   Communication: No difficulties  Cognition Arousal/Alertness: Awake/alert Behavior During Therapy: WFL for tasks assessed/performed Overall Cognitive Status: Within Functional Limits for tasks assessed                                          General Comments      Exercises Total Joint Exercises Ankle Circles/Pumps: AROM, Both, 10 reps, Supine Quad Sets: AROM, Left, 5 reps, Supine Short Arc Quad: AROM, Left, 5 reps, Supine Heel Slides: AAROM, Left, 5 reps, Supine Hip ABduction/ADduction: AAROM, Left, 5 reps, Supine Long Arc Quad: AROM, Left, 10 reps, Seated   Assessment/Plan    PT Assessment Patient needs continued PT services  PT Problem List Decreased strength;Decreased mobility;Decreased activity tolerance;Pain;Decreased balance       PT Treatment Interventions Therapeutic activities;Therapeutic exercise;Gait training;Functional mobility training;Patient/family education    PT Goals (Current goals can be found in the Care Plan section)  Acute Rehab PT Goals Patient Stated Goal: return to independence with mobility PT Goal Formulation: With patient Time For Goal Achievement: 01/15/22 Potential to Achieve Goals: Good    Frequency 7X/week     Co-evaluation               AM-PAC PT "6 Clicks" Mobility  Outcome Measure Help needed turning from your back to your side while in a flat bed without using bedrails?: A Little Help needed moving from lying on your back to sitting on the side of a flat bed without using bedrails?: A Little Help needed moving to and from a bed to a chair (including a wheelchair)?: A Little Help needed standing up from a chair using your arms (e.g., wheelchair or bedside chair)?: A Little Help needed to walk in hospital room?: A Little Help  needed climbing 3-5 steps with a railing? : A Little 6 Click Score: 18    End of Session Equipment Utilized During Treatment: Gait belt Activity Tolerance: Patient tolerated treatment well Patient left: in chair;with chair alarm set;with call bell/phone within reach;with family/visitor present Nurse Communication: Mobility status PT Visit Diagnosis: Difficulty in walking, not elsewhere classified (R26.2);Pain Pain - Right/Left: Left Pain - part of body: Hip    Time: 1056-1130 PT Time Calculation (min) (ACUTE ONLY): 34 min   Charges:   PT Evaluation $PT Eval Moderate Complexity: 1 Mod PT Treatments $Gait Training: 8-22 mins        Blondell Reveal Kistler PT 01/08/2022  Acute Rehabilitation Services Pager [phone] Office [phone]

## 2022-01-08 NOTE — Discharge Summary (Signed)
Physician Discharge Summary  Patient ID: Carla Whilden MRN: [id number] DOB/AGE: 02-04-1929 88 y.o.  Admit date: 01/07/2022 Discharge date: 01/08/2022  Admission Diagnoses: left hip OA  Discharge Diagnoses:  Principal Problem:   S/P total left hip arthroplasty Active Problems:   Esophageal obstruction due to food impaction   Discharged Condition: fair  Hospital Course: Patient underwent a left THA by Dr. Percell Miller on 8/89/16 without complications He spent the night in observation for pain control and mobilization. He had an episode of food impaction due to an esophageal stricture last night after surgery and required an urgent GI consult. GI saw the patient and took him to endoscopy suite to remove 2 areas of impacted food in t he esophagus. He had relief after this procedure. He is feeling better now, tolerating soft foods and liquids, and has passed PT evaluations. He is ready for d/c home with HHPT already set up.   Consults: GI  Significant Diagnostic Studies: n/a  Treatments: IV hydration, antibiotics: Ancef, analgesia: acetaminophen, Vicodin, Dilaudid, and Morphine, procedures: endoscopy with food impaction removal, and surgery: left THA  Discharge Exam: Blood pressure (!) 103/56, pulse 91, temperature 98.6 F (37 C), temperature source Oral, resp. rate 18, height 5\' 9"  (1.753 m), weight 69.4 kg, SpO2 97 %. General appearance: alert, cooperative, and no distress Head: Normocephalic, without obvious abnormality, atraumatic Resp: clear to auscultation bilaterally Cardio: regular rate and rhythm, S1, S2 normal, no murmur, click, rub or gallop GI: soft, non-tender; bowel sounds normal; no masses,  no organomegaly Extremities: extremities normal, atraumatic, no cyanosis or edema Pulses:  L brachial 2+ R brachial 2+  L radial 2+ R radial 2+  L inguinal 2+ R inguinal 2+  L popliteal 2+ R popliteal 2+  L posterior tibial 2+ R posterior tibial 2+  L dorsalis pedis 2+ R  dorsalis pedis 2+   Neurologic: Alert and oriented X 3, normal strength and tone. Normal symmetric reflexes. Normal coordination and gait Incision/Wound: c/d/i  Disposition: Discharge disposition: 01-Home or Self Care       Discharge Instructions     Call MD / Call 911   Complete by: As directed    If you experience chest pain or shortness of breath, CALL 911 and be transported to the hospital emergency room.  If you develope a fever above 101 F, pus (white drainage) or increased drainage or redness at the wound, or calf pain, call your surgeon's office.   Discharge instructions   Complete by: As directed    You may bear weight as tolerated. Keep your dressing on and dry until follow up. Take medicine to prevent blood clots as directed. Take pain medicine as needed with the goal of transitioning to over the counter medicines.    INSTRUCTIONS AFTER JOINT REPLACEMENT   Remove items at home which could result in a fall. This includes throw rugs or furniture in walking pathways ICE to the affected joint every three hours while awake for 30 minutes at a time, for at least the first 3-5 days, and then as needed for pain and swelling.  Continue to use ice for pain and swelling. You may notice swelling that will progress down to the foot and ankle.  This is normal after surgery.  Elevate your leg when you are not up walking on it.   Continue to use the breathing machine you got in the hospital (incentive spirometer) which will help keep your temperature down.  It is common for your temperature to cycle  up and down following surgery, especially at night when you are not up moving around and exerting yourself.  The breathing machine keeps your lungs expanded and your temperature down.   DIET:  As you were doing prior to hospitalization, we recommend a well-balanced diet.  DRESSING / WOUND CARE / SHOWERING  You may shower 3 days after surgery, but keep the wounds dry during showering.  You  may use an occlusive plastic wrap (Press'n Seal for example) with blue painter's tape at edges, NO SOAKING/SUBMERGING IN THE BATHTUB.  If the bandage gets wet, call the office.   ACTIVITY  Increase activity slowly as tolerated, but follow the weight bearing instructions below.   No driving for 6 weeks or until further direction given by your physician.  You cannot drive while taking narcotics.  No lifting or carrying greater than 10 lbs. until further directed by your surgeon. Avoid periods of inactivity such as sitting longer than an hour when not asleep. This helps prevent blood clots.  You may return to work once you are authorized by your doctor.    WEIGHT BEARING   Weight bearing as tolerated with assist device (walker, cane, etc) as directed, use it as long as suggested by your surgeon or therapist, typically at least 4-6 weeks.   EXERCISES  Results after joint replacement surgery are often greatly improved when you follow the exercise, range of motion and muscle strengthening exercises prescribed by your doctor. Safety measures are also important to protect the joint from further injury. Any time any of these exercises cause you to have increased pain or swelling, decrease what you are doing until you are comfortable again and then slowly increase them. If you have problems or questions, call your caregiver or physical therapist for advice.   Rehabilitation is important following a joint replacement. After just a few days of immobilization, the muscles of the leg can become weakened and shrink (atrophy).  These exercises are designed to build up the tone and strength of the thigh and leg muscles and to improve motion. Often times heat used for twenty to thirty minutes before working out will loosen up your tissues and help with improving the range of motion but do not use heat for the first two weeks following surgery (sometimes heat can increase post-operative swelling).   These  exercises can be done on a training (exercise) mat, on the floor, on a table or on a bed. Use whatever works the best and is most comfortable for you.    Use music or television while you are exercising so that the exercises are a pleasant break in your day. This will make your life better with the exercises acting as a break in your routine that you can look forward to.   Perform all exercises about fifteen times, three times per day or as directed.  You should exercise both the operative leg and the other leg as well.  Exercises include:   Quad Sets - Tighten up the muscle on the front of the thigh (Quad) and hold for 5-10 seconds.   Straight Leg Raises - With your knee straight (if you were given a brace, keep it on), lift the leg to 60 degrees, hold for 3 seconds, and slowly lower the leg.  Perform this exercise against resistance later as your leg gets stronger.  Leg Slides: Lying on your back, slowly slide your foot toward your buttocks, bending your knee up off the floor (only go as far as  is comfortable). Then slowly slide your foot back down until your leg is flat on the floor again.  Angel Wings: Lying on your back spread your legs to the side as far apart as you can without causing discomfort.  Hamstring Strength:  Lying on your back, push your heel against the floor with your leg straight by tightening up the muscles of your buttocks.  Repeat, but this time bend your knee to a comfortable angle, and push your heel against the floor.  You may put a pillow under the heel to make it more comfortable if necessary.   A rehabilitation program following joint replacement surgery can speed recovery and prevent re-injury in the future due to weakened muscles. Contact your doctor or a physical therapist for more information on knee rehabilitation.    CONSTIPATION  Constipation is defined medically as fewer than three stools per week and severe constipation as less than one stool per week.  Even if  you have a regular bowel pattern at home, your normal regimen is likely to be disrupted due to multiple reasons following surgery.  Combination of anesthesia, postoperative narcotics, change in appetite and fluid intake all can affect your bowels.   YOU MUST use at least one of the following options; they are listed in order of increasing strength to get the job done.  They are all available over the counter, and you may need to use some, POSSIBLY even all of these options:    Drink plenty of fluids (prune juice may be helpful) and high fiber foods Colace 100 mg by mouth twice a day  Senokot for constipation as directed and as needed Dulcolax (bisacodyl), take with full glass of water  Miralax (polyethylene glycol) once or twice a day as needed.  If you have tried all these things and are unable to have a bowel movement in the first 3-4 days after surgery call either your surgeon or your primary doctor.    If you experience loose stools or diarrhea, hold the medications until you stool forms back up.  If your symptoms do not get better within 1 week or if they get worse, check with your doctor.  If you experience "the worst abdominal pain ever" or develop nausea or vomiting, please contact the office immediately for further recommendations for treatment.   ITCHING:  If you experience itching with your medications, try taking only a single pain pill, or even half a pain pill at a time.  You can also use Benadryl over the counter for itching or also to help with sleep.   TED HOSE STOCKINGS:  Use stockings on both legs until for at least 2 weeks or as directed by physician office. They may be removed at night for sleeping.  MEDICATIONS:  See your medication summary on the "After Visit Summary" that nursing will review with you.  You may have some home medications which will be placed on hold until you complete the course of blood thinner medication.  It is important for you to complete the blood  thinner medication as prescribed.  Take medicines as prescribed.   You have several different medicines that work in different ways. - Tylenol is for mild to moderate pain. Try to take this medicine before turning to your narcotic medicines.  - Meloxicam is to reduce pain / inflammation - Robaxin is for muscle spasms. This medicine can make you drowsy. - Hydrocodone/Norco is a narcotic pain medicine.  Take this for severe pain. This medicine can  be dehydrating / constipating. - Zofran is for nausea and vomiting. - Omeprazole is for gastric protection while taking pain medicine.  - Colace is for constipation prevention.  - Eliquis is to prevent blood clots after surgery.   PRECAUTIONS:  If you experience chest pain or shortness of breath - call 911 immediately for transfer to the hospital emergency department.   If you develop a fever greater that 101 F, purulent drainage from wound, increased redness or drainage from wound, foul odor from the wound/dressing, or calf pain - CONTACT YOUR SURGEON.                                                   FOLLOW-UP APPOINTMENTS:  If you do not already have a post-op appointment, please call the office [phone] for an appointment to be seen by Dr. Percell Miller in 2 weeks.   OTHER INSTRUCTIONS:   MAKE SURE YOU:  Understand these instructions.  Get help right away if you are not doing well or get worse.    Thank you for letting us be a part of your medical care team.  It is a privilege we respect greatly.  We hope these instructions will help you stay on track for a fast and full recovery!   Driving restrictions   Complete by: As directed    No driving for 2 weeks   Post-operative opioid taper instructions:   Complete by: As directed    POST-OPERATIVE OPIOID TAPER INSTRUCTIONS: It is important to wean off of your opioid medication as soon as possible. If you do not need pain medication after your surgery it is ok to stop day one. Opioids  include: Codeine, Hydrocodone(Norco, Vicodin), Oxycodone(Percocet, oxycontin) and hydromorphone amongst others.  Long term and even short term use of opiods can cause: Increased pain response Dependence Constipation Depression Respiratory depression And more.  Withdrawal symptoms can include Flu like symptoms Nausea, vomiting And more Techniques to manage these symptoms Hydrate well Eat regular healthy meals Stay active Use relaxation techniques(deep breathing, meditating, yoga) Do Not substitute Alcohol to help with tapering If you have been on opioids for less than two weeks and do not have pain than it is ok to stop all together.  Plan to wean off of opioids This plan should start within one week post op of your joint replacement. Maintain the same interval or time between taking each dose and first decrease the dose.  Cut the total daily intake of opioids by one tablet each day Next start to increase the time between doses. The last dose that should be eliminated is the evening dose.      TED hose   Complete by: As directed    Use stockings (TED hose) for 2 weeks on both leg(s).  You may remove them at night for sleeping.   Weight bearing as tolerated   Complete by: As directed       Allergies as of 01/08/2022   No Known Allergies      Medication List     TAKE these medications    acetaminophen 500 MG tablet Commonly known as: TYLENOL Take 1,000 mg by mouth every 8 (eight) hours as needed for moderate pain.   apixaban 5 MG Tabs tablet Commonly known as: Eliquis Take 1 tablet (5 mg total) by mouth 2 (two) times daily.  docusate sodium 100 MG capsule Commonly known as: Colace Take 1 capsule (100 mg total) by mouth 2 (two) times daily as needed for up to 10 days for mild constipation or moderate constipation.   hydrochlorothiazide 12.5 MG tablet Commonly known as: HYDRODIURIL Take 12.5 mg by mouth in the morning.   HYDROcodone-acetaminophen 10-325 MG  tablet Commonly known as: Norco Take 1 tablet by mouth every 6 (six) hours as needed for severe pain.   lisinopril 2.5 MG tablet Commonly known as: ZESTRIL Take 1 tablet (2.5 mg total) by mouth daily. Do not fill until patient calls please. What changed: when to take this   methocarbamol 500 MG tablet Commonly known as: Robaxin Take 1 tablet (500 mg total) by mouth every 8 (eight) hours as needed for muscle spasms.   OCUVITE EYE HEALTH FORMULA PO Take 1 tablet by mouth in the morning and at bedtime.   ondansetron 4 MG disintegrating tablet Commonly known as: ZOFRAN-ODT Take 1 tablet (4 mg total) by mouth 2 (two) times daily as needed for nausea or vomiting.   terbinafine 1 % cream Commonly known as: LamISIL AT Apply 1 application topically 2 (two) times daily. What changed:  when to take this reasons to take this   verapamil 240 MG CR tablet Commonly known as: CALAN-SR TAKE 1 TABLET BY MOUTH ONCE DAILY * KEEP UPCOMING APPOINTMENT* What changed: See the new instructions.   Vitamin D 125 MCG (5000 UT) Caps Take 5,000 Units by mouth in the morning.               Discharge Care Instructions  (From admission, onward)           Start     Ordered   01/08/22 0000  Weight bearing as tolerated        01/08/22 1515            Follow-up Information     [name], MD. Go on 01/22/2022.   Specialty: Orthopedic Surgery Why: at 9:00am Contact information: [address] [place] [address] [phone]         [facility] [place] Follow up.   Specialty: [place] Why: providing home health physical therapy Contact information: [address] [place] [address] [phone]         Milus Banister, MD. Go on 02/11/2022.   Specialty: Gastroenterology Why: at 1:30pm Contact information: [address] [place] [address] [phone]                 Signed: Alisa Graff 01/08/2022, 3:17 PM

## 2022-01-08 NOTE — TOC Transition Note (Signed)
Transition of Care [facility]) - CM/SW Discharge Note   Patient Details  Name: Eric Lambert MRN: [id number] Date of Birth: August 21, 1929  Transition of Care [facility]) CM/SW Contact:  Lennart Pall, LCSW Phone Number: 01/08/2022, 10:00 AM   Clinical Narrative:    Met with pt and confirming he has all needed DME at home.  Pt aware HHPT prearranged with [facility] by MD office.  No further TOC needs.   Final next level of care: [place] Barriers to Discharge: No Barriers Identified   Patient Goals and CMS Choice Patient states their goals for this hospitalization and ongoing recovery are:: return home      Discharge Placement                       Discharge Plan and Services                DME Arranged: N/A DME Agency: NA       HH Arranged: PT [place] Agency: [place]        Social Determinants of Health (SDOH) Interventions     Readmission Risk Interventions No flowsheet data found.

## 2022-01-09 DIAGNOSIS — Z8701 Personal history of pneumonia (recurrent): Secondary | ICD-10-CM | POA: Diagnosis not present

## 2022-01-09 DIAGNOSIS — N529 Male erectile dysfunction, unspecified: Secondary | ICD-10-CM | POA: Diagnosis not present

## 2022-01-09 DIAGNOSIS — G4733 Obstructive sleep apnea (adult) (pediatric): Secondary | ICD-10-CM | POA: Diagnosis not present

## 2022-01-09 DIAGNOSIS — I110 Hypertensive heart disease with heart failure: Secondary | ICD-10-CM | POA: Diagnosis not present

## 2022-01-09 DIAGNOSIS — Z4789 Encounter for other orthopedic aftercare: Secondary | ICD-10-CM | POA: Diagnosis not present

## 2022-01-09 DIAGNOSIS — H3530 Unspecified macular degeneration: Secondary | ICD-10-CM | POA: Diagnosis not present

## 2022-01-09 DIAGNOSIS — Z85828 Personal history of other malignant neoplasm of skin: Secondary | ICD-10-CM | POA: Diagnosis not present

## 2022-01-09 DIAGNOSIS — Z96642 Presence of left artificial hip joint: Secondary | ICD-10-CM | POA: Diagnosis not present

## 2022-01-09 DIAGNOSIS — K219 Gastro-esophageal reflux disease without esophagitis: Secondary | ICD-10-CM | POA: Diagnosis not present

## 2022-01-09 DIAGNOSIS — Z7901 Long term (current) use of anticoagulants: Secondary | ICD-10-CM | POA: Diagnosis not present

## 2022-01-09 DIAGNOSIS — Z471 Aftercare following joint replacement surgery: Secondary | ICD-10-CM | POA: Diagnosis not present

## 2022-01-09 DIAGNOSIS — I4819 Other persistent atrial fibrillation: Secondary | ICD-10-CM | POA: Diagnosis not present

## 2022-01-09 DIAGNOSIS — I442 Atrioventricular block, complete: Secondary | ICD-10-CM | POA: Diagnosis not present

## 2022-01-09 DIAGNOSIS — Z950 Presence of cardiac pacemaker: Secondary | ICD-10-CM | POA: Diagnosis not present

## 2022-01-09 DIAGNOSIS — N401 Enlarged prostate with lower urinary tract symptoms: Secondary | ICD-10-CM | POA: Diagnosis not present

## 2022-01-09 DIAGNOSIS — N39498 Other specified urinary incontinence: Secondary | ICD-10-CM | POA: Diagnosis not present

## 2022-01-09 DIAGNOSIS — I509 Heart failure, unspecified: Secondary | ICD-10-CM | POA: Diagnosis not present

## 2022-01-09 NOTE — Anesthesia Postprocedure Evaluation (Signed)
Anesthesia Post Note  Patient: Eric Lambert [facility]  Procedure(s) Performed: ESOPHAGOGASTRODUODENOSCOPY (EGD) WITH PROPOFOL IMPACTION REMOVAL     Patient location during evaluation: PACU Anesthesia Type: General Level of consciousness: awake and alert Pain management: pain level controlled Vital Signs Assessment: post-procedure vital signs reviewed and stable Respiratory status: spontaneous breathing, nonlabored ventilation, respiratory function stable and patient connected to nasal cannula oxygen Cardiovascular status: blood pressure returned to baseline and stable Postop Assessment: no apparent nausea or vomiting Anesthetic complications: no   No notable events documented.  Last Vitals:  Vitals:   01/08/22 0530 01/08/22 1334  BP: 99/77 (!) 103/56  Pulse: 98 91  Resp:  18  Temp:  37 C  SpO2:  97%    Last Pain:  Vitals:   01/08/22 1334  TempSrc: Oral  PainSc:                  Kyl Givler S

## 2022-01-11 DIAGNOSIS — N39498 Other specified urinary incontinence: Secondary | ICD-10-CM | POA: Diagnosis not present

## 2022-01-11 DIAGNOSIS — Z7901 Long term (current) use of anticoagulants: Secondary | ICD-10-CM | POA: Diagnosis not present

## 2022-01-11 DIAGNOSIS — Z950 Presence of cardiac pacemaker: Secondary | ICD-10-CM | POA: Diagnosis not present

## 2022-01-11 DIAGNOSIS — I110 Hypertensive heart disease with heart failure: Secondary | ICD-10-CM | POA: Diagnosis not present

## 2022-01-11 DIAGNOSIS — Z471 Aftercare following joint replacement surgery: Secondary | ICD-10-CM | POA: Diagnosis not present

## 2022-01-11 DIAGNOSIS — I4819 Other persistent atrial fibrillation: Secondary | ICD-10-CM | POA: Diagnosis not present

## 2022-01-11 DIAGNOSIS — G4733 Obstructive sleep apnea (adult) (pediatric): Secondary | ICD-10-CM | POA: Diagnosis not present

## 2022-01-11 DIAGNOSIS — K219 Gastro-esophageal reflux disease without esophagitis: Secondary | ICD-10-CM | POA: Diagnosis not present

## 2022-01-11 DIAGNOSIS — Z96642 Presence of left artificial hip joint: Secondary | ICD-10-CM | POA: Diagnosis not present

## 2022-01-11 DIAGNOSIS — I442 Atrioventricular block, complete: Secondary | ICD-10-CM | POA: Diagnosis not present

## 2022-01-11 DIAGNOSIS — Z8701 Personal history of pneumonia (recurrent): Secondary | ICD-10-CM | POA: Diagnosis not present

## 2022-01-11 DIAGNOSIS — N529 Male erectile dysfunction, unspecified: Secondary | ICD-10-CM | POA: Diagnosis not present

## 2022-01-11 DIAGNOSIS — Z85828 Personal history of other malignant neoplasm of skin: Secondary | ICD-10-CM | POA: Diagnosis not present

## 2022-01-11 DIAGNOSIS — I509 Heart failure, unspecified: Secondary | ICD-10-CM | POA: Diagnosis not present

## 2022-01-11 DIAGNOSIS — H3530 Unspecified macular degeneration: Secondary | ICD-10-CM | POA: Diagnosis not present

## 2022-01-11 DIAGNOSIS — N401 Enlarged prostate with lower urinary tract symptoms: Secondary | ICD-10-CM | POA: Diagnosis not present

## 2022-01-13 DIAGNOSIS — N401 Enlarged prostate with lower urinary tract symptoms: Secondary | ICD-10-CM | POA: Diagnosis not present

## 2022-01-13 DIAGNOSIS — I110 Hypertensive heart disease with heart failure: Secondary | ICD-10-CM | POA: Diagnosis not present

## 2022-01-13 DIAGNOSIS — I509 Heart failure, unspecified: Secondary | ICD-10-CM | POA: Diagnosis not present

## 2022-01-13 DIAGNOSIS — Z950 Presence of cardiac pacemaker: Secondary | ICD-10-CM | POA: Diagnosis not present

## 2022-01-13 DIAGNOSIS — K219 Gastro-esophageal reflux disease without esophagitis: Secondary | ICD-10-CM | POA: Diagnosis not present

## 2022-01-13 DIAGNOSIS — I442 Atrioventricular block, complete: Secondary | ICD-10-CM | POA: Diagnosis not present

## 2022-01-13 DIAGNOSIS — Z471 Aftercare following joint replacement surgery: Secondary | ICD-10-CM | POA: Diagnosis not present

## 2022-01-13 DIAGNOSIS — H3530 Unspecified macular degeneration: Secondary | ICD-10-CM | POA: Diagnosis not present

## 2022-01-13 DIAGNOSIS — Z7901 Long term (current) use of anticoagulants: Secondary | ICD-10-CM | POA: Diagnosis not present

## 2022-01-13 DIAGNOSIS — Z96642 Presence of left artificial hip joint: Secondary | ICD-10-CM | POA: Diagnosis not present

## 2022-01-13 DIAGNOSIS — I4819 Other persistent atrial fibrillation: Secondary | ICD-10-CM | POA: Diagnosis not present

## 2022-01-13 DIAGNOSIS — N529 Male erectile dysfunction, unspecified: Secondary | ICD-10-CM | POA: Diagnosis not present

## 2022-01-13 DIAGNOSIS — Z8701 Personal history of pneumonia (recurrent): Secondary | ICD-10-CM | POA: Diagnosis not present

## 2022-01-13 DIAGNOSIS — N39498 Other specified urinary incontinence: Secondary | ICD-10-CM | POA: Diagnosis not present

## 2022-01-13 DIAGNOSIS — Z85828 Personal history of other malignant neoplasm of skin: Secondary | ICD-10-CM | POA: Diagnosis not present

## 2022-01-13 DIAGNOSIS — G4733 Obstructive sleep apnea (adult) (pediatric): Secondary | ICD-10-CM | POA: Diagnosis not present

## 2022-01-15 ENCOUNTER — Telehealth: Payer: Self-pay | Admitting: Family Medicine

## 2022-01-15 ENCOUNTER — Encounter: Payer: Self-pay | Admitting: Family Medicine

## 2022-01-15 DIAGNOSIS — N401 Enlarged prostate with lower urinary tract symptoms: Secondary | ICD-10-CM | POA: Diagnosis not present

## 2022-01-15 DIAGNOSIS — Z950 Presence of cardiac pacemaker: Secondary | ICD-10-CM | POA: Diagnosis not present

## 2022-01-15 DIAGNOSIS — Z7901 Long term (current) use of anticoagulants: Secondary | ICD-10-CM | POA: Diagnosis not present

## 2022-01-15 DIAGNOSIS — H3530 Unspecified macular degeneration: Secondary | ICD-10-CM | POA: Diagnosis not present

## 2022-01-15 DIAGNOSIS — Z8701 Personal history of pneumonia (recurrent): Secondary | ICD-10-CM | POA: Diagnosis not present

## 2022-01-15 DIAGNOSIS — G4733 Obstructive sleep apnea (adult) (pediatric): Secondary | ICD-10-CM | POA: Diagnosis not present

## 2022-01-15 DIAGNOSIS — N39498 Other specified urinary incontinence: Secondary | ICD-10-CM | POA: Diagnosis not present

## 2022-01-15 DIAGNOSIS — Z96642 Presence of left artificial hip joint: Secondary | ICD-10-CM | POA: Diagnosis not present

## 2022-01-15 DIAGNOSIS — I509 Heart failure, unspecified: Secondary | ICD-10-CM | POA: Diagnosis not present

## 2022-01-15 DIAGNOSIS — Z471 Aftercare following joint replacement surgery: Secondary | ICD-10-CM | POA: Diagnosis not present

## 2022-01-15 DIAGNOSIS — N529 Male erectile dysfunction, unspecified: Secondary | ICD-10-CM | POA: Diagnosis not present

## 2022-01-15 DIAGNOSIS — Z85828 Personal history of other malignant neoplasm of skin: Secondary | ICD-10-CM | POA: Diagnosis not present

## 2022-01-15 DIAGNOSIS — I110 Hypertensive heart disease with heart failure: Secondary | ICD-10-CM | POA: Diagnosis not present

## 2022-01-15 DIAGNOSIS — I442 Atrioventricular block, complete: Secondary | ICD-10-CM | POA: Diagnosis not present

## 2022-01-15 DIAGNOSIS — K219 Gastro-esophageal reflux disease without esophagitis: Secondary | ICD-10-CM | POA: Diagnosis not present

## 2022-01-15 DIAGNOSIS — I4819 Other persistent atrial fibrillation: Secondary | ICD-10-CM | POA: Diagnosis not present

## 2022-01-15 NOTE — Telephone Encounter (Signed)
Patient has called back in regard.  I have given him providers response.

## 2022-01-15 NOTE — Chronic Care Management (AMB) (Signed)
°  Chronic Care Management   Note  01/15/2022 Name: Eric Lambert MRN: [id number] DOB: 24-Sep-1929  Eric Lambert is a 88 y.o. year old male who is a primary care patient of Marin Olp, MD. I reached out to Darcey Nora by phone today in response to a referral sent by Mr. Eric Osowski Elem's PCP, Eric Channel Brayton Mars, MD.   Eric Lambert was given information about Chronic Care Management services today including:  CCM service includes personalized support from designated clinical staff supervised by his physician, including individualized plan of care and coordination with other care providers 24/7 contact phone numbers for assistance for urgent and routine care needs. Service will only be billed when office clinical staff spend 20 minutes or more in a month to coordinate care. Only one practitioner may furnish and bill the service in a calendar month. The patient may stop CCM services at any time (effective at the end of the month) by phone call to the office staff.   Patient wishes to consider information provided and/or speak with a member of the care team before deciding about enrollment in care management services.   Follow up plan:   Tatjana Secretary/administrator

## 2022-01-15 NOTE — Telephone Encounter (Signed)
Pt called very confused with phone call between [place], Tatjana.  Pt  thinks this was a spam call. Pt would like a call from Ms Frederic Jericho is possible.

## 2022-01-17 DIAGNOSIS — K219 Gastro-esophageal reflux disease without esophagitis: Secondary | ICD-10-CM | POA: Diagnosis not present

## 2022-01-17 DIAGNOSIS — Z85828 Personal history of other malignant neoplasm of skin: Secondary | ICD-10-CM | POA: Diagnosis not present

## 2022-01-17 DIAGNOSIS — I509 Heart failure, unspecified: Secondary | ICD-10-CM | POA: Diagnosis not present

## 2022-01-17 DIAGNOSIS — Z8701 Personal history of pneumonia (recurrent): Secondary | ICD-10-CM | POA: Diagnosis not present

## 2022-01-17 DIAGNOSIS — N39498 Other specified urinary incontinence: Secondary | ICD-10-CM | POA: Diagnosis not present

## 2022-01-17 DIAGNOSIS — I442 Atrioventricular block, complete: Secondary | ICD-10-CM | POA: Diagnosis not present

## 2022-01-17 DIAGNOSIS — G4733 Obstructive sleep apnea (adult) (pediatric): Secondary | ICD-10-CM | POA: Diagnosis not present

## 2022-01-17 DIAGNOSIS — H3530 Unspecified macular degeneration: Secondary | ICD-10-CM | POA: Diagnosis not present

## 2022-01-17 DIAGNOSIS — I110 Hypertensive heart disease with heart failure: Secondary | ICD-10-CM | POA: Diagnosis not present

## 2022-01-17 DIAGNOSIS — N401 Enlarged prostate with lower urinary tract symptoms: Secondary | ICD-10-CM | POA: Diagnosis not present

## 2022-01-17 DIAGNOSIS — Z7901 Long term (current) use of anticoagulants: Secondary | ICD-10-CM | POA: Diagnosis not present

## 2022-01-17 DIAGNOSIS — N529 Male erectile dysfunction, unspecified: Secondary | ICD-10-CM | POA: Diagnosis not present

## 2022-01-17 DIAGNOSIS — I4819 Other persistent atrial fibrillation: Secondary | ICD-10-CM | POA: Diagnosis not present

## 2022-01-17 DIAGNOSIS — Z471 Aftercare following joint replacement surgery: Secondary | ICD-10-CM | POA: Diagnosis not present

## 2022-01-17 DIAGNOSIS — Z950 Presence of cardiac pacemaker: Secondary | ICD-10-CM | POA: Diagnosis not present

## 2022-01-17 DIAGNOSIS — Z96642 Presence of left artificial hip joint: Secondary | ICD-10-CM | POA: Diagnosis not present

## 2022-01-20 DIAGNOSIS — I110 Hypertensive heart disease with heart failure: Secondary | ICD-10-CM | POA: Diagnosis not present

## 2022-01-20 DIAGNOSIS — Z7901 Long term (current) use of anticoagulants: Secondary | ICD-10-CM | POA: Diagnosis not present

## 2022-01-20 DIAGNOSIS — Z85828 Personal history of other malignant neoplasm of skin: Secondary | ICD-10-CM | POA: Diagnosis not present

## 2022-01-20 DIAGNOSIS — H3530 Unspecified macular degeneration: Secondary | ICD-10-CM | POA: Diagnosis not present

## 2022-01-20 DIAGNOSIS — N401 Enlarged prostate with lower urinary tract symptoms: Secondary | ICD-10-CM | POA: Diagnosis not present

## 2022-01-20 DIAGNOSIS — Z96642 Presence of left artificial hip joint: Secondary | ICD-10-CM | POA: Diagnosis not present

## 2022-01-20 DIAGNOSIS — N529 Male erectile dysfunction, unspecified: Secondary | ICD-10-CM | POA: Diagnosis not present

## 2022-01-20 DIAGNOSIS — N39498 Other specified urinary incontinence: Secondary | ICD-10-CM | POA: Diagnosis not present

## 2022-01-20 DIAGNOSIS — I509 Heart failure, unspecified: Secondary | ICD-10-CM | POA: Diagnosis not present

## 2022-01-20 DIAGNOSIS — G4733 Obstructive sleep apnea (adult) (pediatric): Secondary | ICD-10-CM | POA: Diagnosis not present

## 2022-01-20 DIAGNOSIS — Z8701 Personal history of pneumonia (recurrent): Secondary | ICD-10-CM | POA: Diagnosis not present

## 2022-01-20 DIAGNOSIS — I442 Atrioventricular block, complete: Secondary | ICD-10-CM | POA: Diagnosis not present

## 2022-01-20 DIAGNOSIS — K219 Gastro-esophageal reflux disease without esophagitis: Secondary | ICD-10-CM | POA: Diagnosis not present

## 2022-01-20 DIAGNOSIS — Z471 Aftercare following joint replacement surgery: Secondary | ICD-10-CM | POA: Diagnosis not present

## 2022-01-20 DIAGNOSIS — Z950 Presence of cardiac pacemaker: Secondary | ICD-10-CM | POA: Diagnosis not present

## 2022-01-20 DIAGNOSIS — I4819 Other persistent atrial fibrillation: Secondary | ICD-10-CM | POA: Diagnosis not present

## 2022-01-22 DIAGNOSIS — I110 Hypertensive heart disease with heart failure: Secondary | ICD-10-CM | POA: Diagnosis not present

## 2022-01-22 DIAGNOSIS — H3530 Unspecified macular degeneration: Secondary | ICD-10-CM | POA: Diagnosis not present

## 2022-01-22 DIAGNOSIS — I4819 Other persistent atrial fibrillation: Secondary | ICD-10-CM | POA: Diagnosis not present

## 2022-01-22 DIAGNOSIS — I442 Atrioventricular block, complete: Secondary | ICD-10-CM | POA: Diagnosis not present

## 2022-01-22 DIAGNOSIS — Z8701 Personal history of pneumonia (recurrent): Secondary | ICD-10-CM | POA: Diagnosis not present

## 2022-01-22 DIAGNOSIS — N401 Enlarged prostate with lower urinary tract symptoms: Secondary | ICD-10-CM | POA: Diagnosis not present

## 2022-01-22 DIAGNOSIS — K219 Gastro-esophageal reflux disease without esophagitis: Secondary | ICD-10-CM | POA: Diagnosis not present

## 2022-01-22 DIAGNOSIS — M1612 Unilateral primary osteoarthritis, left hip: Secondary | ICD-10-CM | POA: Diagnosis not present

## 2022-01-22 DIAGNOSIS — I509 Heart failure, unspecified: Secondary | ICD-10-CM | POA: Diagnosis not present

## 2022-01-22 DIAGNOSIS — Z950 Presence of cardiac pacemaker: Secondary | ICD-10-CM | POA: Diagnosis not present

## 2022-01-22 DIAGNOSIS — Z96642 Presence of left artificial hip joint: Secondary | ICD-10-CM | POA: Diagnosis not present

## 2022-01-22 DIAGNOSIS — Z85828 Personal history of other malignant neoplasm of skin: Secondary | ICD-10-CM | POA: Diagnosis not present

## 2022-01-22 DIAGNOSIS — N529 Male erectile dysfunction, unspecified: Secondary | ICD-10-CM | POA: Diagnosis not present

## 2022-01-22 DIAGNOSIS — Z7901 Long term (current) use of anticoagulants: Secondary | ICD-10-CM | POA: Diagnosis not present

## 2022-01-22 DIAGNOSIS — G4733 Obstructive sleep apnea (adult) (pediatric): Secondary | ICD-10-CM | POA: Diagnosis not present

## 2022-01-22 DIAGNOSIS — N39498 Other specified urinary incontinence: Secondary | ICD-10-CM | POA: Diagnosis not present

## 2022-01-22 DIAGNOSIS — Z471 Aftercare following joint replacement surgery: Secondary | ICD-10-CM | POA: Diagnosis not present

## 2022-01-24 DIAGNOSIS — N529 Male erectile dysfunction, unspecified: Secondary | ICD-10-CM | POA: Diagnosis not present

## 2022-01-24 DIAGNOSIS — N39498 Other specified urinary incontinence: Secondary | ICD-10-CM | POA: Diagnosis not present

## 2022-01-24 DIAGNOSIS — Z85828 Personal history of other malignant neoplasm of skin: Secondary | ICD-10-CM | POA: Diagnosis not present

## 2022-01-24 DIAGNOSIS — I110 Hypertensive heart disease with heart failure: Secondary | ICD-10-CM | POA: Diagnosis not present

## 2022-01-24 DIAGNOSIS — Z8701 Personal history of pneumonia (recurrent): Secondary | ICD-10-CM | POA: Diagnosis not present

## 2022-01-24 DIAGNOSIS — Z7901 Long term (current) use of anticoagulants: Secondary | ICD-10-CM | POA: Diagnosis not present

## 2022-01-24 DIAGNOSIS — I4819 Other persistent atrial fibrillation: Secondary | ICD-10-CM | POA: Diagnosis not present

## 2022-01-24 DIAGNOSIS — Z471 Aftercare following joint replacement surgery: Secondary | ICD-10-CM | POA: Diagnosis not present

## 2022-01-24 DIAGNOSIS — G4733 Obstructive sleep apnea (adult) (pediatric): Secondary | ICD-10-CM | POA: Diagnosis not present

## 2022-01-24 DIAGNOSIS — Z950 Presence of cardiac pacemaker: Secondary | ICD-10-CM | POA: Diagnosis not present

## 2022-01-24 DIAGNOSIS — I509 Heart failure, unspecified: Secondary | ICD-10-CM | POA: Diagnosis not present

## 2022-01-24 DIAGNOSIS — K219 Gastro-esophageal reflux disease without esophagitis: Secondary | ICD-10-CM | POA: Diagnosis not present

## 2022-01-24 DIAGNOSIS — I442 Atrioventricular block, complete: Secondary | ICD-10-CM | POA: Diagnosis not present

## 2022-01-24 DIAGNOSIS — N401 Enlarged prostate with lower urinary tract symptoms: Secondary | ICD-10-CM | POA: Diagnosis not present

## 2022-01-24 DIAGNOSIS — H3530 Unspecified macular degeneration: Secondary | ICD-10-CM | POA: Diagnosis not present

## 2022-01-24 DIAGNOSIS — Z96642 Presence of left artificial hip joint: Secondary | ICD-10-CM | POA: Diagnosis not present

## 2022-01-28 DIAGNOSIS — Z85828 Personal history of other malignant neoplasm of skin: Secondary | ICD-10-CM | POA: Diagnosis not present

## 2022-01-28 DIAGNOSIS — L821 Other seborrheic keratosis: Secondary | ICD-10-CM | POA: Diagnosis not present

## 2022-01-28 DIAGNOSIS — L570 Actinic keratosis: Secondary | ICD-10-CM | POA: Diagnosis not present

## 2022-01-29 ENCOUNTER — Telehealth: Payer: Self-pay | Admitting: Internal Medicine

## 2022-01-29 ENCOUNTER — Ambulatory Visit (INDEPENDENT_AMBULATORY_CARE_PROVIDER_SITE_OTHER): Payer: Medicare HMO

## 2022-01-29 DIAGNOSIS — I4821 Permanent atrial fibrillation: Secondary | ICD-10-CM

## 2022-01-29 LAB — CUP PACEART REMOTE DEVICE CHECK
Battery Remaining Longevity: 18 mo
Battery Remaining Percentage: 26 %
Brady Statistic RV Percent Paced: 48 %
Date Time Interrogation Session: 20230308031200
Implantable Lead Implant Date: 19960809
Implantable Lead Location: 753860
Implantable Lead Model: 4285
Implantable Lead Serial Number: 209144
Implantable Pulse Generator Implant Date: 20130614
Lead Channel Impedance Value: 812 Ohm
Lead Channel Pacing Threshold Amplitude: 0.9 V
Lead Channel Pacing Threshold Pulse Width: 0.4 ms
Lead Channel Setting Pacing Amplitude: 1.4 V
Lead Channel Setting Pacing Pulse Width: 0.4 ms
Lead Channel Setting Sensing Sensitivity: 2.5 mV
Pulse Gen Serial Number: 115653

## 2022-01-29 NOTE — Telephone Encounter (Signed)
Left message for pt to call back  °

## 2022-01-29 NOTE — Telephone Encounter (Signed)
Patient returned your call.  Please call back

## 2022-01-29 NOTE — Telephone Encounter (Signed)
Left message for pt to call back. 3/8 ?

## 2022-01-29 NOTE — Telephone Encounter (Signed)
Patient called and stated that he had a EGD done with Dr. Hilarie Fredrickson and he received something about him having a  blood transfusion. Patient is confused. Please advise.   ?

## 2022-02-04 NOTE — Telephone Encounter (Signed)
Pt did not return the call. 

## 2022-02-11 ENCOUNTER — Ambulatory Visit: Payer: Medicare HMO | Admitting: Gastroenterology

## 2022-02-11 ENCOUNTER — Encounter: Payer: Self-pay | Admitting: Gastroenterology

## 2022-02-11 VITALS — BP 110/70 | HR 65 | Ht 69.0 in | Wt 156.0 lb

## 2022-02-11 DIAGNOSIS — K219 Gastro-esophageal reflux disease without esophagitis: Secondary | ICD-10-CM

## 2022-02-11 DIAGNOSIS — R1319 Other dysphagia: Secondary | ICD-10-CM | POA: Diagnosis not present

## 2022-02-11 MED ORDER — FAMOTIDINE 20 MG PO TABS: 20.0000 mg | ORAL_TABLET | Freq: Every day | ORAL | Status: DC

## 2022-02-11 MED ORDER — OMEPRAZOLE 40 MG PO CPDR: 40.0000 mg | DELAYED_RELEASE_CAPSULE | Freq: Every day | ORAL | 3 refills | Status: DC

## 2022-02-11 NOTE — Patient Instructions (Addendum)
If you are age 86 or older, your body mass index should be between 23-30. Your Body mass index is 23.04 kg/m?Marland Kitchen If this is out of the aforementioned range listed, please consider follow up with your Primary Care Provider. ?___________________________________________ ? ?The  GI providers would like to encourage you to use [facility] to communicate with providers for non-urgent requests or questions.  Due to long hold times on the telephone, sending your provider a message by [facility] may be a faster and more efficient way to get a response.  Please allow 48 business hours for a response.  Please remember that this is for non-urgent requests.  ?_______________________________________________________ ? ?We have sent the following medications to your pharmacy for you to pick up at your convenience: ? ?START: Omeprazole '40mg'$  take one capsule shortly before breakfast meal each day. ? ?Please purchase the following medications over the counter and take as directed: ? ?START: Pepcid '20mg'$  take one tablet at night each night. ? ?You will need a Barium Esophogram in 2 months (May 2023).  We will contact you to schedule this appointment. ? ?Thank you for entrusting me with your care and choosing [facility]. ? ?Dr Ardis Hughs ? ?

## 2022-02-11 NOTE — Progress Notes (Signed)
?HPI: ?This is a very pleasant 88 year old man ? ? ?I last saw himmyself about 10 years ago, 2012.  That was for dysphagia.  He was having very rare intermittent dysphagia.  Barium esophagram in 2017 was essentially normal.  I repeated the barium esophagram for him in 2012 and it was also normal. ? ?He was hospitalized last month for an elective left hip replacement.  While he was recuperating from that in the hospital he had overt esophageal food impaction.  Dr. Hilarie Fredrickson helped him with EGD.  Moderately severe esophagitis (felt to be from food stasis and possibly reflux) and a GE junction stricture that did not appear malignant.  He told him to be on proton pump inhibitor twice daily and return to see me in the office to consider repeat endoscopy. ? ?Unfortunately it looks like he was not sent home on proton pump inhibitor. ? ?He is on Eliquis daily for atrial fibrillation. ? ?Blood work February 2023 showed normal CBC except for slightly low platelets of 130 ? ?Today he tells me that he has been having nocturnal coughing, when laying down for bed this last for several hours.  He is also been having some mild pyrosis. ? ?He admits that if he chews his food very well and take small bites he does not have any problems with swallowing however if he ears in any way that he will usually have dysphagia.  This is solids only ? ?No weight loss ? ?ROS: complete GI ROS as described in HPI, all other review negative. ? ?Constitutional:  No unintentional weight loss ? ? ?Past Medical History:  ?Diagnosis Date  ? Allergy   ? Arthritis   ? Atrial fibrillation -permanent   ? BPH (benign prostatic hyperplasia)   ? Cancer [facility])   ? HX OF SKIN CANCER   ? CHF (congestive heart failure) ([place])   ? resolved after pacemaker - tachycardia induced  ? Complete heart block ([place])   ? Dyspnea   ? mild  ? ED (erectile dysfunction)   ? GERD (gastroesophageal reflux disease)   ? GLUCOSE INTOLERANCE 10/22/2007  ? no recent issues  ? Heart murmur    ? OSA (obstructive sleep apnea)   ? NO CPAP   ? Pacemaker BSX   ? dual  ? Pneumonia   ? HX OF SEVERAL TIMES AS A CHILD   ? PONV (postoperative nausea and vomiting)   ? at age 73   ? Presence of permanent cardiac pacemaker   ? SUBACUTE BACTERIAL ENDOCARDITIS 1970s  ? ? ?Past Surgical History:  ?Procedure Laterality Date  ? ESOPHAGOGASTRODUODENOSCOPY (EGD) WITH PROPOFOL N/A 01/07/2022  ? Procedure: ESOPHAGOGASTRODUODENOSCOPY (EGD) WITH PROPOFOL;  Surgeon: Jerene Bears, MD;  Location: [facility];  Service: Gastroenterology;  Laterality: N/A;  ? IMPACTION REMOVAL  01/07/2022  ? Procedure: IMPACTION REMOVAL;  Surgeon: Jerene Bears, MD;  Location: [facility];  Service: Gastroenterology;;  ? INGUINAL HERNIA REPAIR Bilateral 04/08/2021  ? Procedure: LAPAROSCOPIC BILATERAL INGUINAL HERNIA REPAIR WITH MESH;  Surgeon: Kinsinger, Arta Bruce, MD;  Location: [facility];  Service: General;  Laterality: Bilateral;  ? INSERT / REPLACE / REMOVE PACEMAKER    ? PACEMAKER GENERATOR CHANGE N/A 05/07/2012  ? Procedure: PACEMAKER GENERATOR CHANGE;  Surgeon: [name], MD;  Location[facility] CATH LAB;  Service: Cardiovascular;  Laterality: N/A;  ? PACEMAKER PLACEMENT    ? PARTIAL HIP ARTHROPLASTY    ? 2008  ? TOTAL HIP ARTHROPLASTY Left 01/07/2022  ? Procedure: TOTAL HIP ARTHROPLASTY  ANTERIOR APPROACH;  Surgeon: [name], MD;  Location: [facility];  Service: Orthopedics;  Laterality: Left;  ? ? ?Current Outpatient Medications  ?Medication Instructions  ? apixaban (ELIQUIS) 5 mg, Oral, 2 times daily  ? lisinopril (ZESTRIL) 2.5 mg, Oral, Daily, Do not fill until patient calls please.  ? Multiple Vitamins-Minerals (OCUVITE EYE HEALTH FORMULA PO) 1 tablet, Oral, 2 times daily  ? terbinafine (LAMISIL AT) 1 % cream 1 application., Topical, 2 times daily  ? verapamil (CALAN-SR) 240 MG CR tablet TAKE 1 TABLET BY MOUTH ONCE DAILY * KEEP UPCOMING APPOINTMENT*  ? Vitamin D 5,000 Units, Oral, Every morning  ? ? ?Allergies as of 02/11/2022  ? (No  Known Allergies)  ? ? ?Family History  ?Problem Relation Age of Onset  ? Prostate cancer Father   ? Heart disease Other   ?     mothers side men- strokes and heart attacks  ? Diabetes Brother   ? Prostate cancer Brother   ? Brain cancer Brother   ? ? ?Social History  ? ?Socioeconomic History  ? Marital status: Married  ?  Spouse name: Not on file  ? Number of children: 2  ? Years of education: Not on file  ? Highest education level: Not on file  ?Occupational History  ? Occupation: English as a second language teacher  ?  Employer: RETIRED  ?  Comment: Working part time  ?Tobacco Use  ? Smoking status: Never  ? Smokeless tobacco: Never  ?Vaping Use  ? Vaping Use: Never used  ?Substance and Sexual Activity  ? Alcohol use: Not Currently  ?  Comment: hx of 10-15 years ago   ? Drug use: No  ? Sexual activity: Not on file  ?Other Topics Concern  ? Not on file  ?Social History Narrative  ? Married. 2 children. 1 grandkid. 1 greatgrandchild.   ?   ? 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   ? Hobbies: reading, plays bridge on internet, watch reruns, enjoys talking politics  ? ?Social Determinants of Health  ? ?Financial Resource Strain: Not on file  ?Food Insecurity: Not on file  ?Transportation Needs: Not on file  ?Physical Activity: Not on file  ?Stress: Not on file  ?Social Connections: Not on file  ?Intimate Partner Violence: Not on file  ? ? ? ?Physical Exam: ?BP 110/70   Pulse 65   Ht '5\' 9"'$  (1.753 m)   Wt 156 lb (70.8 kg)   BMI 23.04 kg/m?  ?Constitutional: generally well-appearing ?Psychiatric: alert and oriented x3 ?Abdomen: soft, nontender, nondistended, no obvious ascites, no peritoneal signs, normal bowel sounds ?No peripheral edema noted in lower extremities ? ?Assessment and plan: ?88 y.o. male with chronic GERD, intermittent dysphagia, recent esophageal food obstruction ? ?He underwent an EGD while hospitalized for esophageal food impaction.  This is in the setting of mild chronic intermittent dysphagia to  solids only as well as some GERD symptoms.  We recommended that he be discharged home on proton pump inhibitor however unfortunately he was not sent home on any antiacid medicines.  His weight is overall stable. ? ?I have low suspicion of underlying malignancy.  I am going to see if medicine therapy alone will help his dysphagia.  He is going to start omeprazole 40 mg 1 pill shortly before breakfast every morning and a Pepcid 20 mg 1 pill at bedtime every night.  In 2 months he will have a barium esophagram and if that shows persistent lower esophageal  stricture then we will arrange EGD at his soonest convenience, holding his Eliquis 2 days as appropriate. ? ? ?Please see the "Patient Instructions" section for addition details about the plan. ? ?Owens Loffler, MD ?[facility] Gastro[facility] ?02/11/2022, 1:51 PM ? ? ?Total time on date of encounter was 30 minutes (this included time spent preparing to see the patient reviewing records; obtaining and/or reviewing separately obtained history; performing a medically appropriate exam and/or evaluation; counseling and educating the patient and family if present; ordering medications, tests or procedures if applicable; and documenting clinical information in the health record). ? ? ? ? ? ?

## 2022-02-11 NOTE — Progress Notes (Signed)
Remote pacemaker transmission.   

## 2022-02-20 ENCOUNTER — Telehealth: Payer: Self-pay

## 2022-02-20 ENCOUNTER — Encounter: Payer: Self-pay | Admitting: Internal Medicine

## 2022-02-20 NOTE — Telephone Encounter (Signed)
Called patient back.   ? ?Explained to patient that the battery life is an approximate time and appears WNL. Patient would like to have battery estimated included in summary reports. Note made in [place].  ?

## 2022-02-20 NOTE — Telephone Encounter (Signed)
Patient called in stating that he will like a call back about his battery life. 10/24/2021 his battery life was 2 yrs and now 01/29/2022 it is 1 yr and 6 months and he is concerned why it is draining so fast.  ?

## 2022-03-11 DIAGNOSIS — Z961 Presence of intraocular lens: Secondary | ICD-10-CM | POA: Diagnosis not present

## 2022-03-11 DIAGNOSIS — H353132 Nonexudative age-related macular degeneration, bilateral, intermediate dry stage: Secondary | ICD-10-CM | POA: Diagnosis not present

## 2022-03-24 ENCOUNTER — Telehealth: Payer: Self-pay

## 2022-03-24 DIAGNOSIS — R1319 Other dysphagia: Secondary | ICD-10-CM

## 2022-03-24 DIAGNOSIS — K219 Gastro-esophageal reflux disease without esophagitis: Secondary | ICD-10-CM

## 2022-03-24 NOTE — Telephone Encounter (Signed)
You have been scheduled for a Barium Esophogram at [facility] (1st floor of the hospital) on 04-01-22 at 11am. Please arrive 30 minutes prior to your appointment for registration. Make certain not to have anything to eat or drink 3 hours prior to your test. If you need to reschedule for any reason, please contact radiology at [phone] to do so. ?____________________________________ ?A barium swallow is an examination that concentrates on views of the esophagus. This tends to be a double contrast exam (barium and two liquids which, when combined, create a gas to distend the wall of the oesophagus) or single contrast (non-ionic iodine based). The study is usually tailored to your symptoms so a good history is essential. Attention is paid during the study to the form, structure and configuration of the esophagus, looking for functional disorders (such as aspiration, dysphagia, achalasia, motility and reflux) ? ?EXAMINATION ? ?You may be asked to change into a gown, depending on the type of swallow being performed. A radiologist and radiographer will perform the procedure. The radiologist will advise you of the type of contrast selected for your procedure and direct you during the exam. You will be asked to stand, sit or lie in several different positions and to hold a small amount of fluid in your mouth before being asked to swallow while the imaging is performed .In some instances you may be asked to swallow barium coated marshmallows to assess the motility of a solid food bolus. ? ?The exam can be recorded as a digital or video fluoroscopy procedure. ? ?POST PROCEDURE ? ?It will take 1-2 days for the barium to pass through your system. To facilitate this, it is important, unless otherwise directed, to increase your fluids for the next 24-48hrs and to resume your normal diet.  ? ?This test typically takes about 30 minutes to  perform. ?________________________________________________________________________________ ?

## 2022-03-24 NOTE — Telephone Encounter (Signed)
-----   Message from [place] sent at 03/13/2022  8:52 AM EDT ----- ?Regarding: FW: Barium esophogram ? ?----- Message ----- ?From: Stevan Born, CMA ?Sent: 03/10/2022  12:00 AM EDT ?To: Stevan Born, CMA ?Subject: Barium esophogram                             ? ?Patient needs Barium esophogram in May 2023 per DJ dx dysphagia and GERD. ? ? ?

## 2022-04-01 ENCOUNTER — Ambulatory Visit (HOSPITAL_COMMUNITY)
Admission: RE | Admit: 2022-04-01 | Discharge: 2022-04-01 | Disposition: A | Discharge status: Home / Self Care | Payer: Medicare HMO | Source: Ambulatory Visit | Attending: Gastroenterology | Admitting: Gastroenterology

## 2022-04-01 DIAGNOSIS — K219 Gastro-esophageal reflux disease without esophagitis: Secondary | ICD-10-CM | POA: Insufficient documentation

## 2022-04-01 DIAGNOSIS — R1319 Other dysphagia: Secondary | ICD-10-CM | POA: Diagnosis not present

## 2022-04-01 DIAGNOSIS — R1310 Dysphagia, unspecified: Secondary | ICD-10-CM | POA: Diagnosis not present

## 2022-04-01 DIAGNOSIS — K224 Dyskinesia of esophagus: Secondary | ICD-10-CM | POA: Diagnosis not present

## 2022-04-04 ENCOUNTER — Other Ambulatory Visit: Payer: Self-pay | Admitting: Internal Medicine

## 2022-04-04 NOTE — Telephone Encounter (Signed)
Age 88, weight 71kg, SCr 1.12 on 12/26/21, afib indication, has annual appt with MD in 1-2 weeks, will refill ?

## 2022-04-10 ENCOUNTER — Encounter: Payer: Medicare HMO | Admitting: Internal Medicine

## 2022-04-16 ENCOUNTER — Ambulatory Visit: Payer: Medicare HMO | Admitting: Internal Medicine

## 2022-04-16 ENCOUNTER — Encounter: Payer: Self-pay | Admitting: Internal Medicine

## 2022-04-16 VITALS — BP 132/70 | HR 60 | Ht 68.0 in | Wt 152.0 lb

## 2022-04-16 DIAGNOSIS — I4821 Permanent atrial fibrillation: Secondary | ICD-10-CM

## 2022-04-16 DIAGNOSIS — R001 Bradycardia, unspecified: Secondary | ICD-10-CM | POA: Diagnosis not present

## 2022-04-16 DIAGNOSIS — Z950 Presence of cardiac pacemaker: Secondary | ICD-10-CM | POA: Diagnosis not present

## 2022-04-16 NOTE — Progress Notes (Signed)
Patient Care Team: Marin Olp, MD as PCP - General (Family Medicine) [name], MD as PCP - Electrophysiology (Cardiology) [name], MD as PCP - Cardiology (Cardiology) Berle Mull, MD as Consulting Physician (Sports Medicine)   HPI  My Eric Lambert is a 88 y.o. 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  Longstanding cough in the setting of his ACE inhibitor.    Anticoagulation with apixaban; no bleeding  The patient denies chest pain, shortness of breath, nocturnal dyspnea, orthopnea or peripheral edema.  There have been no palpitations, lightheadedness or syncope.    His wife is sick with a pulm mass the cause of which is still being sought-this turned out to be an autoimmune process.  She is now has a low-grade leukemia. , Date Cr K Hgb  1/19 0.9  14.4   8/20 1.26  14.5  3/22 0.97 4.2 14.6  2/23 1.12 4.1 13.9               Last echo was 2008.   Past Medical History:  Diagnosis Date   Allergy    Arthritis    Atrial fibrillation -permanent    BPH (benign prostatic hyperplasia)    Cancer ([facility])    HX OF SKIN CANCER    CHF (congestive heart failure) ([place])    resolved after pacemaker - tachycardia induced   Complete heart block ([facility])    Dyspnea    mild   ED (erectile dysfunction)    GERD (gastroesophageal reflux disease)    GLUCOSE INTOLERANCE 10/22/2007   no recent issues   Heart murmur    OSA (obstructive sleep apnea)    NO CPAP    Pacemaker BSX    dual   Pneumonia    HX OF SEVERAL TIMES AS A CHILD    PONV (postoperative nausea and vomiting)    at age 64    Presence of permanent cardiac pacemaker    SUBACUTE BACTERIAL ENDOCARDITIS 1970s    Past Surgical History:  Procedure Laterality Date   ESOPHAGOGASTRODUODENOSCOPY (EGD) WITH PROPOFOL N/A  01/07/2022   Procedure: ESOPHAGOGASTRODUODENOSCOPY (EGD) WITH PROPOFOL;  Surgeon: Jerene Bears, MD;  [facility]: [facility];  Service: Gastroenterology;  Laterality: N/A;   IMPACTION REMOVAL  01/07/2022   Procedure: IMPACTION REMOVAL;  Surgeon: Jerene Bears, MD;  Location: [facility];  Service: Gastroenterology;;   INGUINAL HERNIA REPAIR Bilateral 04/08/2021   Procedure: LAPAROSCOPIC BILATERAL INGUINAL HERNIA REPAIR WITH MESH;  Surgeon: Kinsinger, Arta Bruce, MD;  Location: [facility];  Service: General;  Laterality: Bilateral;   INSERT / REPLACE / REMOVE PACEMAKER     PACEMAKER GENERATOR CHANGE N/A 05/07/2012   Procedure: PACEMAKER GENERATOR CHANGE;  Surgeon: [name], MD;  Location: [facility] CATH LAB;  Service: Cardiovascular;  Laterality: N/A;   PACEMAKER PLACEMENT     PARTIAL HIP ARTHROPLASTY     2008   TOTAL HIP ARTHROPLASTY Left 01/07/2022   Procedure: TOTAL HIP ARTHROPLASTY ANTERIOR APPROACH;  Surgeon: [name], MD;  Location: [facility];  Service: Orthopedics;  Laterality: Left;    Current Outpatient Medications  Medication Sig Dispense Refill   apixaban (ELIQUIS) 5 MG TABS tablet Take 1 tablet by mouth twice daily 180 tablet 1   Cholecalciferol (VITAMIN D) 125 MCG (5000 UT) CAPS Take 5,000 Units by mouth in the morning.     Cyanocobalamin (VITAMIN  B-12 PO) Take 2 capsules by mouth daily.     famotidine (PEPCID) 20 MG tablet Take 1 tablet (20 mg total) by mouth at bedtime.     lisinopril (ZESTRIL) 2.5 MG tablet Take 1 tablet (2.5 mg total) by mouth daily. Do not fill until patient calls please. (Patient taking differently: Take 2.5 mg by mouth every evening. Do not fill until patient calls please.) 90 tablet 3   Multiple Vitamins-Minerals (OCUVITE EYE HEALTH FORMULA PO) Take 1 tablet by mouth in the morning and at bedtime.     omeprazole (PRILOSEC) 40 MG capsule Take 1 capsule (40 mg total) by mouth daily before breakfast. 90 capsule 3   terbinafine (LAMISIL AT) 1 % cream Apply 1  application topically 2 (two) times daily. (Patient taking differently: Apply 1 application. topically 2 (two) times daily as needed (skin irritaion/rash.).) 42 g 1   verapamil (CALAN-SR) 240 MG CR tablet TAKE 1 TABLET BY MOUTH ONCE DAILY * KEEP UPCOMING APPOINTMENT* (Patient taking differently: 240 mg at bedtime.) 90 tablet 3   No current facility-administered medications for this visit.    No Known Allergies  Review of Systems negative except from HPI and PMH  Physical Exam BP 132/70   Pulse 60   Ht '5\' 8"'$  (1.727 m)   Wt 152 lb (68.9 kg)   SpO2 98%   BMI 23.11 kg/m  Well developed and well nourished in no acute distress HENT normal Neck supple with JVP-flat Clear Device pocket well healed; without hematoma or erythema.  There is no tethering  Regular rate and rhythm, no murmur Abd-soft with active BS No Clubbing cyanosis  edema Skin-warm and dry A & Oriented  Grossly normal sensory and motor function  ECG atrial fibrillation with ventricular pacing at 60    Assessment and  Plan  Atrial fibrillation  Permanent    Pacemaker-[employer]  The patient's device was interrogated.  The information was reviewed. No changes were made in the programming.        Bradycardia  Hypertension   Renal function grade 3  VT NS   No bleeding atrial fibrillation on Eliquis continue 5 mg twice daily  Blood pressure well controlled.  Continue verapamil 240 and lisinopril 2.5.  We discussed stopping the lisinopril given that his blood pressures at home are in the 120 range, he was disinclined.  He is having no pain in his knee.  I suggested that prophylactic knee surgery might not be the best idea

## 2022-04-16 NOTE — Patient Instructions (Signed)

## 2022-04-24 ENCOUNTER — Telehealth: Payer: Self-pay

## 2022-04-24 ENCOUNTER — Other Ambulatory Visit: Payer: Self-pay

## 2022-04-24 DIAGNOSIS — K222 Esophageal obstruction: Secondary | ICD-10-CM

## 2022-04-24 DIAGNOSIS — R1319 Other dysphagia: Secondary | ICD-10-CM

## 2022-04-24 NOTE — Telephone Encounter (Signed)
Patient called regarding the upcoming procedure he states he is not aware of requested a call back.

## 2022-04-24 NOTE — Telephone Encounter (Signed)
Clinical pharmacist to review Eliquis 

## 2022-04-24 NOTE — Telephone Encounter (Signed)
[facility] Pre-operative Risk Assessment     Request for surgical clearance:     Endoscopy Procedure  What type of surgery is being performed?     EGD  When is this surgery scheduled?     07/10/22  What type of clearance is required ?   Pharmacy  Are there any medications that need to be held prior to surgery and how long? Eliquis  Practice name and name of physician performing surgery?      [facility]  What is your office phone and fax number?      Phone- [phone]  [phone]  Anesthesia type (None, local, MAC, general) ?       MAC

## 2022-04-25 NOTE — Telephone Encounter (Signed)
The pt has been advised and will discuss more at upcoming office visit

## 2022-04-25 NOTE — Telephone Encounter (Signed)
Patient with diagnosis of Afib on Eliquis for anticoagulation.    Procedure: EGD Date of procedure: 07/10/2022   CHA2DS2-VASc Score = 5  This indicates a 7.2% annual risk of stroke. The patient's score is based upon: CHF History: 1 HTN History: 1 Diabetes History: 0 Stroke History: 0 Vascular Disease History: 1 Age Score: 2 Gender Score: 0   CrCl 43 mL/min Platelet count 139K  Per office protocol, patient can hold Eliquis for 1-2 days prior to procedure.

## 2022-04-30 ENCOUNTER — Ambulatory Visit (INDEPENDENT_AMBULATORY_CARE_PROVIDER_SITE_OTHER): Payer: Medicare HMO

## 2022-04-30 DIAGNOSIS — I495 Sick sinus syndrome: Secondary | ICD-10-CM | POA: Diagnosis not present

## 2022-05-01 LAB — CUP PACEART REMOTE DEVICE CHECK
Battery Remaining Longevity: 18 mo
Battery Remaining Percentage: 22 %
Brady Statistic RV Percent Paced: 74 %
Date Time Interrogation Session: 20230607031100
Implantable Lead Implant Date: 19960809
Implantable Lead Location: 753860
Implantable Lead Model: 4285
Implantable Lead Serial Number: 209144
Implantable Pulse Generator Implant Date: 20130614
Lead Channel Impedance Value: 819 Ohm
Lead Channel Pacing Threshold Amplitude: 1 V
Lead Channel Pacing Threshold Pulse Width: 0.4 ms
Lead Channel Setting Pacing Amplitude: 1.3 V
Lead Channel Setting Pacing Pulse Width: 0.4 ms
Lead Channel Setting Sensing Sensitivity: 2.5 mV
Pulse Gen Serial Number: 115653

## 2022-05-05 ENCOUNTER — Other Ambulatory Visit: Payer: Self-pay | Admitting: Internal Medicine

## 2022-05-07 ENCOUNTER — Encounter: Payer: Medicare HMO | Admitting: Family Medicine

## 2022-05-09 ENCOUNTER — Encounter: Payer: Self-pay | Admitting: Family Medicine

## 2022-05-09 ENCOUNTER — Ambulatory Visit (INDEPENDENT_AMBULATORY_CARE_PROVIDER_SITE_OTHER): Payer: Medicare HMO | Admitting: Family Medicine

## 2022-05-09 VITALS — BP 110/60 | HR 60 | Temp 99.1°F | Ht 68.0 in | Wt 148.5 lb

## 2022-05-09 DIAGNOSIS — I700 Atherosclerosis of aorta: Secondary | ICD-10-CM

## 2022-05-09 DIAGNOSIS — Z0000 Encounter for general adult medical examination without abnormal findings: Secondary | ICD-10-CM

## 2022-05-09 DIAGNOSIS — I429 Cardiomyopathy, unspecified: Secondary | ICD-10-CM | POA: Diagnosis not present

## 2022-05-09 DIAGNOSIS — I4821 Permanent atrial fibrillation: Secondary | ICD-10-CM

## 2022-05-09 DIAGNOSIS — I10 Essential (primary) hypertension: Secondary | ICD-10-CM | POA: Diagnosis not present

## 2022-05-09 DIAGNOSIS — R739 Hyperglycemia, unspecified: Secondary | ICD-10-CM | POA: Diagnosis not present

## 2022-05-09 DIAGNOSIS — D696 Thrombocytopenia, unspecified: Secondary | ICD-10-CM | POA: Diagnosis not present

## 2022-05-09 LAB — COMPREHENSIVE METABOLIC PANEL
ALT: 12 U/L (ref 0–53)
AST: 15 U/L (ref 0–37)
Albumin: 3.8 g/dL (ref 3.5–5.2)
Alkaline Phosphatase: 69 U/L (ref 39–117)
BUN: 32 mg/dL — ABNORMAL HIGH (ref 6–23)
CO2: 26 mEq/L (ref 19–32)
Calcium: 9.3 mg/dL (ref 8.4–10.5)
Chloride: 103 mEq/L (ref 96–112)
Creatinine, Ser: 1.14 mg/dL (ref 0.40–1.50)
GFR: 56.47 mL/min — ABNORMAL LOW (ref >60.00)
Glucose, Bld: 140 mg/dL — ABNORMAL HIGH (ref 70–99)
Potassium: 4.5 mEq/L (ref 3.5–5.1)
Sodium: 137 mEq/L (ref 135–145)
Total Bilirubin: 0.5 mg/dL (ref 0.2–1.2)
Total Protein: 6.4 g/dL (ref 6.0–8.3)

## 2022-05-09 LAB — CBC WITH DIFFERENTIAL/PLATELET
Basophils Absolute: 0 10*3/uL (ref 0.0–0.1)
Basophils Relative: 0.9 % (ref 0.0–3.0)
Eosinophils Absolute: 0.2 10*3/uL (ref 0.0–0.7)
Eosinophils Relative: 3.7 % (ref 0.0–5.0)
HCT: 38.3 % — ABNORMAL LOW (ref 39.0–52.0)
Hemoglobin: 12.8 g/dL — ABNORMAL LOW (ref 13.0–17.0)
Lymphocytes Relative: 21.6 % (ref 12.0–46.0)
Lymphs Abs: 1 10*3/uL (ref 0.7–4.0)
MCHC: 33.3 g/dL (ref 30.0–36.0)
MCV: 87.1 fl (ref 78.0–100.0)
Monocytes Absolute: 0.5 10*3/uL (ref 0.1–1.0)
Monocytes Relative: 10 % (ref 3.0–12.0)
Neutro Abs: 3 10*3/uL (ref 1.4–7.7)
Neutrophils Relative %: 63.8 % (ref 43.0–77.0)
Platelets: 124 10*3/uL — ABNORMAL LOW (ref 150.0–400.0)
RBC: 4.4 Mil/uL (ref 4.22–5.81)
RDW: 14.9 % (ref 11.5–15.5)
WBC: 4.7 10*3/uL (ref 4.0–10.5)

## 2022-05-09 LAB — HEMOGLOBIN A1C: Hgb A1c MFr Bld: 5.6 % (ref 4.6–6.5)

## 2022-05-09 NOTE — Patient Instructions (Addendum)
Health Maintenance Due  Topic Date Due   Zoster Vaccines- Shingrix (1 of 2) Please check with your pharmacy to see if they have the shingrix vaccine. If they do- please get this immunization and update [place] by phone call or mychart with dates you receive the vaccine  Never done   COVID-19 Vaccine (6 - Booster for [employer] series)- send [place] date of last covid shot 11/06/2021   Please stop by lab before you go If you have mychart- we will send your results within 3 business days of [place] receiving them.  If you do not have mychart- we will call you about results within 5 business days of [place] receiving them.  *please also note that you will see labs on mychart as soon as they post. I will later go in and write notes on them- will say "notes from Dr. Yong Channel"   Recommended follow up: Return in about 6 months (around 11/08/2022) for physical or sooner if needed.Schedule b4 you leave.

## 2022-05-09 NOTE — Progress Notes (Signed)
Phone: [phone]   Subjective:  Patient presents today for their annual physical. Chief complaint-noted.   See problem oriented charting- ROS- full  review of systems was completed and negative  except for: occasional reflux, some right knee pain  The following were reviewed and entered/updated in epic: Past Medical History:  Diagnosis Date   Allergy    Arthritis    Atrial fibrillation -permanent    BPH (benign prostatic hyperplasia)    Cancer (HCC)    HX OF SKIN CANCER    CHF (congestive heart failure) ([place])    resolved after pacemaker - tachycardia induced   Complete heart block (HCC)    Dyspnea    mild   ED (erectile dysfunction)    GERD (gastroesophageal reflux disease)    GLUCOSE INTOLERANCE 10/22/2007   no recent issues   Heart murmur    OSA (obstructive sleep apnea)    NO CPAP    Pacemaker BSX    dual   Pneumonia    HX OF SEVERAL TIMES AS A CHILD    PONV (postoperative nausea and vomiting)    at age 32    Presence of permanent cardiac pacemaker    SUBACUTE BACTERIAL ENDOCARDITIS 1970s   Patient Active Problem List   Diagnosis Date Noted   PPM-[employer] 02/07/2009    Priority: High   Permanent atrial fibrillation ([place]) 10/21/2007    Priority: High   Esophageal obstruction due to food impaction     Priority: Medium    Aortic atherosclerosis ([place]) 08/13/2020    Priority: Medium    BPPV (benign paroxysmal positional vertigo) 12/10/2017    Priority: Medium    Bradycardia 02/02/2013    Priority: Medium    Thrombocytopenia ([place]) 08/04/2011    Priority: Medium    Obstructive sleep apnea 10/21/2007    Priority: Medium    Essential hypertension 10/21/2007    Priority: Medium    GERD 10/21/2007    Priority: Medium    BPH associated with nocturia 10/21/2007    Priority: Medium    Erectile dysfunction 05/28/2016    Priority: Low   Macular degeneration 05/28/2016    Priority: Low   Right hip pain 08/04/2013    Priority: Low   Secondary  cardiomyopathy ([place]) 02/07/2009    Priority: Low   Osteopenia 10/22/2007    Priority: Low   SUBACUTE BACTERIAL ENDOCARDITIS 10/21/2007    Priority: Low   S/P total left hip arthroplasty 01/07/2022   Degenerative disc disease, lumbar 07/18/2019   Chronic low back pain 06/09/2018   Right knee pain 06/02/2017   Past Surgical History:  Procedure Laterality Date   ESOPHAGOGASTRODUODENOSCOPY (EGD) WITH PROPOFOL N/A 01/07/2022   Procedure: ESOPHAGOGASTRODUODENOSCOPY (EGD) WITH PROPOFOL;  Surgeon: Jerene Bears, MD;  Location: [facility];  Service: Gastroenterology;  Laterality: N/A;   IMPACTION REMOVAL  01/07/2022   Procedure: IMPACTION REMOVAL;  Surgeon: Jerene Bears, MD;  Location: [facility];  Service: Gastroenterology;;   INGUINAL HERNIA REPAIR Bilateral 04/08/2021   Procedure: LAPAROSCOPIC BILATERAL INGUINAL HERNIA REPAIR WITH MESH;  Surgeon: Kinsinger, Arta Bruce, MD;  Location: [facility];  Service: General;  Laterality: Bilateral;   INSERT / REPLACE / REMOVE PACEMAKER     PACEMAKER GENERATOR CHANGE N/A 05/07/2012   Procedure: PACEMAKER GENERATOR CHANGE;  Surgeon: [name], MD;  Location: [facility] CATH LAB;  Service: Cardiovascular;  Laterality: N/A;   PACEMAKER PLACEMENT     PARTIAL HIP ARTHROPLASTY     2008   TOTAL HIP ARTHROPLASTY Left 01/07/2022  Procedure: TOTAL HIP ARTHROPLASTY ANTERIOR APPROACH;  Surgeon: [name], MD;  Location: [facility];  Service: Orthopedics;  Laterality: Left;    Family History  Problem Relation Age of Onset   Prostate cancer Father    Heart disease Other        mothers side men- strokes and heart attacks   Diabetes Brother    Prostate cancer Brother    Brain cancer Brother     Medications- reviewed and updated Current Outpatient Medications  Medication Sig Dispense Refill   apixaban (ELIQUIS) 5 MG TABS tablet Take 1 tablet by mouth twice daily 180 tablet 1   Cholecalciferol (VITAMIN D) 125 MCG (5000 UT) CAPS Take 5,000 Units by mouth in the  morning.     Cyanocobalamin (VITAMIN B-12 PO) Take 2 capsules by mouth daily.     famotidine (PEPCID) 20 MG tablet Take 1 tablet (20 mg total) by mouth at bedtime.     lisinopril (ZESTRIL) 2.5 MG tablet Take 1 tablet (2.5 mg total) by mouth daily. Do not fill until patient calls please. (Patient taking differently: Take 2.5 mg by mouth every evening. Do not fill until patient calls please.) 90 tablet 3   Multiple Vitamins-Minerals (OCUVITE EYE HEALTH FORMULA PO) Take 1 tablet by mouth in the morning and at bedtime.     omeprazole (PRILOSEC) 40 MG capsule Take 1 capsule (40 mg total) by mouth daily before breakfast. 90 capsule 3   terbinafine (LAMISIL AT) 1 % cream Apply 1 application topically 2 (two) times daily. (Patient taking differently: Apply 1 application  topically 2 (two) times daily as needed (skin irritaion/rash.).) 42 g 1   verapamil (CALAN-SR) 240 MG CR tablet Take 1 tablet (240 mg total) by mouth daily. 90 tablet 3   No current facility-administered medications for this visit.    Allergies-reviewed and updated No Known Allergies  Social History   Social History Narrative   Married. 2 children. 1 grandkid. 1 greatgrandchild.       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 Oropharynx normal other than some mild drainage Neck: no thyromegaly CV: RRR no murmurs rubs or gallops Lungs: CTAB no crackles, wheeze, rhonchi Abdomen: soft/nontender/nondistended/normal bowel sounds. No rebound or guarding.  Ext: no edema Skin: warm, dry Neuro: grossly normal, moves all extremities, PERRLA, walks with cane    Assessment and Plan  88 y.o. male  presenting for annual physical.  Health Maintenance counseling: 1. Anticipatory guidance: Patient counseled regarding regular dental exams -q6 months, eye exams -yearly,  avoiding smoking and second hand smoke , limiting alcohol to 2 beverages per day - doesn't drink alcohol, no illicit drugs.   2. Risk factor reduction:  Advised patient of need for regular exercise and diet rich and fruits and vegetables to reduce risk of heart attack and stroke.  Exercise- very little- some stretching, doing some yardwork.  Diet/weight management-previously dealing with some weight loss but has stablized/done well over last year.  Wt Readings from Last 3 Encounters:  05/09/22 148 lb 8 oz (67.4 kg)  04/16/22 152 lb (  68.9 kg)  02/11/22 156 lb (70.8 kg)  3. Immunizations/screenings/ancillary studies- shingrix recommended at pharmacy. Thinks he has had covid shot since October in last month Immunization History  Administered Date(s) Administered   Fluad Quad(high Dose 65+) 08/23/2019, 08/13/2020, 08/12/2021   Influenza Split 08/02/2012   Influenza Whole 09/27/2007, 08/30/2008, 08/16/2009, 08/05/2010   Influenza, High Dose Seasonal PF 10/05/2017, 09/17/2018   Influenza,inj,Quad PF,6+ Mos 08/04/2013, 10/04/2014, 10/08/2015, 09/29/2016   PFIZER(Purple Top)SARS-COV-2 Vaccination 12/09/2019, 12/30/2019, 08/20/2020, 03/25/2021, 09/11/2021   Pneumococcal Conjugate-13 05/25/2015   Pneumococcal Polysaccharide-23 10/22/2007   Tdap 05/25/2015  4. Prostate cancer screening- past age based screening recommendations  5. Colon cancer screening - past age based screening recommendations 6. Skin cancer screening- follows with dermatology yearly. advised regular sunscreen use- mainly stays indoords. Denies worrisome, changing, or new skin lesions.  7. Smoking associated screening (lung cancer screening, AAA screen 65-75, UA)- never smoker 8. STD screening - no sexual activity outisde of marriage  Status of chronic or acute  concerns    # Atrial fibrillation #Bradycardia-patient with pacemaker in place S: Rate controlled with verapamil 240 mg extended release Anticoagulated with Eliquis 5 mg twice daily.  Denies recent falls  Patient is  followed by cardiology Dr. Caryl Comes A/P: Appropriately anticoagulated and rate controlled plus has pacemaker in place for bradycardia issues and has regular follow-up with Dr. Juluis Pitch current medication  #hypertension #Secondary cardiomyopathy-tachycardia induced in the past due to A-fib before rate control S: medication: Lisinopril 2.5 mg, verapamil 240 mg daily - no lightheadedness- has discussed possibly stopping lisinopril with Dr. Caryl Comes but wants to hold off -has been as high as 135 at home A/P: Controlled. Continue current medications.  -secondary cardiomyopathy controlled with  tachycardia controlled BP Readings from Last 3 Encounters:  05/09/22 110/60  04/16/22 132/70  02/11/22 110/70   #hyperlipidemia #Aortic atherosclerosis S: Medication: None- but LDL under 70 Lab Results  Component Value Date   CHOL 146 11/11/2021   HDL 81.60 11/11/2021   LDLCALC 54 11/11/2021   TRIG 51.0 11/11/2021   CHOLHDL 2 11/11/2021  A/P: Most recent lipids have been controlled even without medication-continue to monitor at least annually    # Hyperglycemia/insulin resistance/prediabetes- peak a1c 5.8 S:  Medication: none Lab Results  Component Value Date   HGBA1C 5.8 07/19/2021  A/P: hopefully stable or improved  update a1c today. Continue current meds for now  #Food impaction/gerd with esophagitis -seen in emergency room February 2023 and treated by Dr. Herschell Dimes to this had left total hip replacement.  Has upcoming EGD as well- he is reconsidering at this point- plan was to repeat dilation of stricture. He reports he is on omeprazole  40 mg  for prolonged basis and also on famotidine at bedtime.   #Thrombocytopenia-chronic mild issue.  We will monitor CBC at least  annually   #Osteopenia listed with worst T score in 2011-1.0 left femur-takes vitamin D.  Interestingly enough appears to take Reclast in the past- wntas to hold off on dexa for now   Recommended follow up: Return in about 6 months (around 11/08/2022) for physical or sooner if needed.Schedule b4 you leave. Future Appointments  Date Time Provider [place]  06/03/2022 11:10 AM Milus Banister, MD [facility]  07/30/2022  7:30 AM [facility] DEVICE REMOTES [facility]  10/29/2022  7:30 AM [facility] DEVICE REMOTES [facility]   Lab/Order associations:NOT fasting   ICD-10-CM   1. Preventative health care  Z00.00 CBC with Differential/Platelet    Comprehensive metabolic panel  Hemoglobin A1c    2. Permanent atrial fibrillation (HCC)  I48.21     3. Thrombocytopenia (HCC)  D69.6 CBC with Differential/Platelet    4. Essential hypertension  I10 CBC with Differential/Platelet    Comprehensive metabolic panel    5. Aortic atherosclerosis (HCC)  I70.0     6. Hyperglycemia  R73.9 Hemoglobin A1c    7. Secondary cardiomyopathy ([place]) Chronic I42.9       No orders of the defined types were placed in this encounter.   Return precautions advised.  Garret Reddish, MD

## 2022-05-09 NOTE — Progress Notes (Signed)
Remote pacemaker transmission.   

## 2022-05-12 ENCOUNTER — Telehealth: Payer: Self-pay | Admitting: Family Medicine

## 2022-05-12 DIAGNOSIS — D649 Anemia, unspecified: Secondary | ICD-10-CM

## 2022-05-12 NOTE — Telephone Encounter (Signed)
Patient is requesting for CBC order to be entered so he can schedule.    Also,  Would like to updated COVID boosters to be updated in the system:  09/11/21  pfizer booster - does not know location  03/31/22  pfizer booster   - [facility] [address]

## 2022-05-12 NOTE — Telephone Encounter (Signed)
FYI-- Called patient on 06/19 to schedule lab visit but LVM due to no answer.

## 2022-05-12 NOTE — Telephone Encounter (Signed)
CBC has been ordered, ok to schedule lab for 1 month. Vaccines updated also.

## 2022-05-16 ENCOUNTER — Telehealth: Payer: Self-pay | Admitting: Internal Medicine

## 2022-06-03 ENCOUNTER — Encounter: Payer: Self-pay | Admitting: Gastroenterology

## 2022-06-03 ENCOUNTER — Ambulatory Visit: Payer: Medicare HMO | Admitting: Gastroenterology

## 2022-06-03 VITALS — BP 116/60 | HR 60 | Ht 68.0 in | Wt 148.2 lb

## 2022-06-03 DIAGNOSIS — K219 Gastro-esophageal reflux disease without esophagitis: Secondary | ICD-10-CM

## 2022-06-03 DIAGNOSIS — R1319 Other dysphagia: Secondary | ICD-10-CM

## 2022-06-03 MED ORDER — OMEPRAZOLE 40 MG PO CPDR: 40.0000 mg | DELAYED_RELEASE_CAPSULE | Freq: Two times a day (BID) | ORAL | 1 refills | Status: DC

## 2022-06-03 NOTE — Patient Instructions (Signed)
If you are age 86 or older, your body mass index should be between 23-30. Your Body mass index is 22.54 kg/m. If this is out of the aforementioned range listed, please consider follow up with your Primary Care Provider. ________________________________________________________  [facility] providers would like to encourage you to use [facility] to communicate with providers for non-urgent requests or questions.  Due to long hold times on the telephone, sending your provider a message by [facility] may be a faster and more efficient way to get a response.  Please allow 48 business hours for a response.  Please remember that this is for non-urgent requests.  _______________________________________________________  We have sent the following medications to your pharmacy for you to pick up at your convenience:  INCREASE: omeprazole '40mg'$  one capsule twice daily 30 minutes before breakfast meal and dinner meal each day.  You will need a follow up appointment in 3 months (October 2023).  We will contact you to schedule this appointment.  Thank you for entrusting me with your care and choosing [facility]  Dr Ardis Hughs

## 2022-06-03 NOTE — Progress Notes (Signed)
Review of pertinent gastrointestinal problems: 1.  Dysphagia evaluation 2012. He was having very rare intermittent dysphagia.  Barium esophagram in 2017 was essentially normal.  I repeated the barium esophagram for him in 2012 and it was also normal.  Esophageal food impaction 12/2021.  This occurred while he was hospitalized for elective hip replacement.  EGD 12/2021 Dr. 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  He told him to be on proton pump inhibitor twice daily and return to see me in the office to consider repeat endoscopy.  Unfortunately he was not put on those proton pump inhibitors and I saw him in the office 02/2022, I started him on omeprazole 40 mg before breakfast and Pepcid at bedtime and     Barium esophagram 03/2022:  The patient had difficulty initiating swallow of a 13 mm barium tablet.   Prominent cricopharyngeus muscle impression upon the posterior aspect of the lower hypopharynx/upper cervical esophagus. However, no delayed contrast or tablet transit at this site.   Delayed passage of the 13 mm barium tablet from the distal esophagus into the stomach. Findings suggest the presence of a mild smooth distal esophageal stricture. Consider endoscopy for further evaluation.   Mild-to-moderate intermittent esophageal dysmotility with tertiary contractions.   Suspected very small sliding hiatal hernia.   Trace gastroesophageal reflux observed to the level of the lower esophagus.     HPI: This is a very pleasant 88 year old man who I last saw here in the office 3 to 4 months ago.  He underwent a barium esophagram, see that result above.  He is still bothered by intermittent dysphagia to solids.  This happens about once a month.  Often happens with barbecue or when he eats fast.  His weight is overall stable.  He does have heartburn symptoms oftentimes throughout the day  despite the fact that he is on omeprazole once daily.  ROS: complete GI ROS as described in HPI, all other review negative.  Constitutional:  No unintentional weight loss   Past Medical History:  Diagnosis Date   Allergy    Arthritis    Atrial fibrillation -permanent    BPH (benign prostatic hyperplasia)    Cancer (HCC)    HX OF SKIN CANCER    CHF (congestive heart failure) ([place])    resolved after pacemaker - tachycardia induced   Complete heart block (HCC)    Dyspnea    mild   ED (erectile dysfunction)    GERD (gastroesophageal reflux disease)    GLUCOSE INTOLERANCE 10/22/2007   no recent issues   Heart murmur    OSA (obstructive sleep apnea)    NO CPAP    Pacemaker BSX    dual   Pneumonia    HX OF SEVERAL TIMES AS A CHILD    PONV (postoperative nausea and vomiting)    at age 88    Presence of permanent cardiac pacemaker    SUBACUTE BACTERIAL ENDOCARDITIS 1970s    Past Surgical History:  Procedure Laterality Date   ESOPHAGOGASTRODUODENOSCOPY (EGD) WITH PROPOFOL N/A 01/07/2022   Procedure: ESOPHAGOGASTRODUODENOSCOPY (EGD) WITH PROPOFOL;  Surgeon: Jerene Bears, MD;  Location: [facility];  Service: Gastroenterology;  Laterality: N/A;   IMPACTION REMOVAL  01/07/2022   Procedure: IMPACTION REMOVAL;  Surgeon: Jerene Bears, MD;  Location: [facility];  Service: Gastroenterology;;   INGUINAL HERNIA REPAIR Bilateral 04/08/2021   Procedure: LAPAROSCOPIC BILATERAL INGUINAL HERNIA REPAIR WITH  MESH;  Surgeon: Kinsinger, Arta Bruce, MD;  Location: [facility];  Service: General;  Laterality: Bilateral;   INSERT / REPLACE / REMOVE PACEMAKER     PACEMAKER GENERATOR CHANGE N/A 05/07/2012   Procedure: PACEMAKER GENERATOR CHANGE;  Surgeon: [name], MD;  Location: [facility] CATH LAB;  Service: Cardiovascular;  Laterality: N/A;   PACEMAKER PLACEMENT     PARTIAL HIP ARTHROPLASTY     2008   TOTAL HIP ARTHROPLASTY Left 01/07/2022   Procedure: TOTAL HIP ARTHROPLASTY ANTERIOR APPROACH;  Surgeon:  [name], MD;  Location: [facility];  Service: Orthopedics;  Laterality: Left;    Current Outpatient Medications  Medication Instructions   apixaban (ELIQUIS) 5 MG TABS tablet Take 1 tablet by mouth twice daily   Cyanocobalamin (VITAMIN B-12 PO) 2 capsules, Oral, Daily   famotidine (PEPCID) 20 mg, Oral, Daily at bedtime   lisinopril (ZESTRIL) 2.5 mg, Oral, Daily, Do not fill until patient calls please.   Multiple Vitamins-Minerals (OCUVITE EYE HEALTH FORMULA PO) 1 tablet, 2 times daily   omeprazole (PRILOSEC) 40 mg, Oral, Daily before breakfast   terbinafine (LAMISIL AT) 1 % cream 1 application , Topical, 2 times daily   verapamil (CALAN-SR) 240 mg, Oral, Daily   Vitamin D 5,000 Units, Oral, Every morning    Allergies as of 06/03/2022   (No Known Allergies)    Family History  Problem Relation Age of Onset   Prostate cancer Father    Diabetes Brother    Prostate cancer Brother    Brain cancer Brother    Heart disease Other        mothers side men- strokes and heart attacks   Colon cancer Neg Hx    Pancreatic cancer Neg Hx    Stomach cancer Neg Hx     Social History   Socioeconomic History   Marital status: Married    Spouse name: Not on file   Number of children: 2   Years of education: Not on file   Highest education level: Not on file  Occupational History   Occupation: Training and development officer: RETIRED    Comment: Working part time  Tobacco Use   Smoking status: Never   Smokeless tobacco: Never  Vaping Use   Vaping Use: Never used  Substance and Sexual Activity   Alcohol use: Not Currently    Comment: hx of 10-15 years ago    Drug use: No   Sexual activity: Not on file  Other Topics Concern   Not on file  Social History Narrative   Married. 2 children. 1 grandkid. 1 greatgrandchild.       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, plays bridge on internet, Neurosurgeon, enjoys talking politics   Social  Determinants of Radio broadcast assistant Strain: Not on Art therapist Insecurity: Not on file  Transportation Needs: Not on file  Physical Activity: Not on file  Stress: Not on file  Social Connections: Not on file  Intimate Partner Violence: Not on file     Physical Exam: BP 116/60   Pulse 60   Ht '5\' 8"'$  (1.727 m)   Wt 148 lb 4 oz (67.2 kg)   BMI 22.54 kg/m  Constitutional: Elderly, frail, walks with a cane Psychiatric: alert and oriented x3 Abdomen: soft, nontender, nondistended, no obvious ascites, no peritoneal signs, normal bowel sounds No peripheral edema noted in lower extremities  Assessment and plan: 88  y.o. male with GERD, intermittent solid food dysphagia  We had a very nice discussion today.  He understands there are risks to upper endoscopies and dilation.  Specifically he would have to hold his blood thinner, also he is 89 and fairly frail and that carries a risk in itself.  Endoscopically there is a risk of causing bleeding or perforation.  All those risks are to me relatively small and I think we would get him through upper endoscopy safely.  He is however understandably a bit guarded about this and would prefer a more conservative approach.  Instead we are going to double his omeprazole to 40 mg twice daily and he will return to see me in the office in 3 months to discuss how he feels from a daily heartburn perspective and whether this is helped his intermittent dysphagia symptoms.  I think it is unlikely that he has underlying neoplasia.  Please see the "Patient Instructions" section for addition details about the plan.  Owens Loffler, MD [facility] 06/03/2022, 11:19 AM   Total time on date of encounter was 25 minutes (this included time spent preparing to see the patient reviewing records; obtaining and/or reviewing separately obtained history; performing a medically appropriate exam and/or evaluation; counseling and educating the patient and family  if present; ordering medications, tests or procedures if applicable; and documenting clinical information in the health record).

## 2022-06-09 ENCOUNTER — Other Ambulatory Visit (INDEPENDENT_AMBULATORY_CARE_PROVIDER_SITE_OTHER): Payer: Medicare HMO

## 2022-06-09 DIAGNOSIS — D649 Anemia, unspecified: Secondary | ICD-10-CM

## 2022-06-09 LAB — CBC WITH DIFFERENTIAL/PLATELET
Basophils Absolute: 0 10*3/uL (ref 0.0–0.1)
Basophils Relative: 0.5 % (ref 0.0–3.0)
Eosinophils Absolute: 0.1 10*3/uL (ref 0.0–0.7)
Eosinophils Relative: 2.3 % (ref 0.0–5.0)
HCT: 38 % — ABNORMAL LOW (ref 39.0–52.0)
Hemoglobin: 12.7 g/dL — ABNORMAL LOW (ref 13.0–17.0)
Lymphocytes Relative: 25.4 % (ref 12.0–46.0)
Lymphs Abs: 1.1 10*3/uL (ref 0.7–4.0)
MCHC: 33.3 g/dL (ref 30.0–36.0)
MCV: 87.3 fl (ref 78.0–100.0)
Monocytes Absolute: 0.4 10*3/uL (ref 0.1–1.0)
Monocytes Relative: 9.4 % (ref 3.0–12.0)
Neutro Abs: 2.8 10*3/uL (ref 1.4–7.7)
Neutrophils Relative %: 62.4 % (ref 43.0–77.0)
Platelets: 125 10*3/uL — ABNORMAL LOW (ref 150.0–400.0)
RBC: 4.35 Mil/uL (ref 4.22–5.81)
RDW: 15.2 % (ref 11.5–15.5)
WBC: 4.4 10*3/uL (ref 4.0–10.5)

## 2022-06-12 ENCOUNTER — Telehealth: Payer: Self-pay

## 2022-06-12 ENCOUNTER — Encounter: Payer: Self-pay | Admitting: Family Medicine

## 2022-06-12 NOTE — Telephone Encounter (Signed)
I sent mychart message per his request- can you check to make sure we have heard back in the next week?

## 2022-06-12 NOTE — Telephone Encounter (Signed)
Pt called back responding to the question about the referral to oncology on his lab results. Pt states he started out seeing Dr. Shawna Orleans in 2012 and this was noted then and now almost 15 years later it is being brought back up and he wants to know why this was "ignored" for so many years. He states the numbers are only marginally low and he does not understand your thinking on why you want him to see oncology for this. I have explained to the patient that you just want to make sure that we are not missing anything regarding his health and that oncology/hematology specialize in this area and are able to evaluate things closer. Pt states he still does not understand this and wants to see how/why you are thinking this way and now he thinks that something is seriously wrong with him. Pt does not understand why you have not reviewed his records from when he saw Dr. Shawna Orleans and feels like his health is being ignored.Pt would like to be responded to via mychart so that he can read/see for himself and have it in his records.

## 2022-06-13 ENCOUNTER — Telehealth: Payer: Self-pay | Admitting: Oncology

## 2022-06-13 ENCOUNTER — Other Ambulatory Visit: Payer: Self-pay

## 2022-06-13 DIAGNOSIS — D696 Thrombocytopenia, unspecified: Secondary | ICD-10-CM

## 2022-06-13 DIAGNOSIS — D649 Anemia, unspecified: Secondary | ICD-10-CM

## 2022-06-13 NOTE — Telephone Encounter (Signed)
Attempted to contact patient in regards to referral, no answer so voicemail was left for patient to call back  

## 2022-06-13 NOTE — Progress Notes (Signed)
Order change

## 2022-06-13 NOTE — Telephone Encounter (Signed)
Referral placed with hematology

## 2022-06-18 ENCOUNTER — Other Ambulatory Visit: Payer: Self-pay | Admitting: *Deleted

## 2022-06-18 DIAGNOSIS — D696 Thrombocytopenia, unspecified: Secondary | ICD-10-CM

## 2022-06-18 NOTE — Progress Notes (Signed)
Lab orders entered for new patient appt

## 2022-06-20 ENCOUNTER — Telehealth: Payer: Self-pay

## 2022-06-20 NOTE — Telephone Encounter (Signed)
Left message for patient to return call to schedule 3 month follow up appointment in October with Dr Ardis Hughs GERD, dysphagia.  Will continue efforts.

## 2022-06-23 NOTE — Telephone Encounter (Signed)
Patient returned call and is scheduled to follow up with Dr Ardis Hughs on 08-27-22.

## 2022-06-26 NOTE — Progress Notes (Signed)
New Hematology/Oncology Consult   Requesting MD: Dr. Garret Reddish  [phone]  Reason for Consult: Anemia  HPI: Eric Lambert is a 88 year old man referred for evaluation of anemia.  He saw Dr. Yong Channel for an annual physical on 05/09/2022.  CBC returned with a hemoglobin of 12.8, MCV 87, white count 4.7, platelet count 124,000; unremarkable chemistry panel.  He returned for a follow-up CBC 06/09/2022-hemoglobin 12.7, MCV 87, platelet count 125,000.  Most recent comparison CBC from 12/26/2021 hemoglobin 13.9, platelet count 139,000 (preoperative CBC prior to left total hip arthroplasty for osteoarthritis 01/07/2022).  Review of records in the EMR show mild thrombocytopenia dating to at least 2008.    Past Medical History:  Diagnosis Date   Allergy    Arthritis    Atrial fibrillation -permanent    BPH (benign prostatic hyperplasia)    Cancer (HCC)    HX OF SKIN CANCER    CHF (congestive heart failure) ([place])    resolved after pacemaker - tachycardia induced   Complete heart block (HCC)    Dyspnea    mild   ED (erectile dysfunction)    GERD (gastroesophageal reflux disease)    GLUCOSE INTOLERANCE 10/22/2007   no recent issues   Heart murmur    OSA (obstructive sleep apnea)    NO CPAP    Pacemaker BSX    dual   Pneumonia    HX OF SEVERAL TIMES AS A CHILD    PONV (postoperative nausea and vomiting)    at age 55    Presence of permanent cardiac pacemaker    SUBACUTE BACTERIAL ENDOCARDITIS 1970s     Past Surgical History:  Procedure Laterality Date   ESOPHAGOGASTRODUODENOSCOPY (EGD) WITH PROPOFOL N/A 01/07/2022   Procedure: ESOPHAGOGASTRODUODENOSCOPY (EGD) WITH PROPOFOL;  Surgeon: Jerene Bears, MD;  Location: [facility];  Service: Gastroenterology;  Laterality: N/A;   IMPACTION REMOVAL  01/07/2022   Procedure: IMPACTION REMOVAL;  Surgeon: Jerene Bears, MD;  Location: [facility];  Service: Gastroenterology;;   INGUINAL HERNIA REPAIR Bilateral 04/08/2021   Procedure:  LAPAROSCOPIC BILATERAL INGUINAL HERNIA REPAIR WITH MESH;  Surgeon: Kinsinger, Arta Bruce, MD;  Location: [facility];  Service: General;  Laterality: Bilateral;   INSERT / REPLACE / REMOVE PACEMAKER     PACEMAKER GENERATOR CHANGE N/A 05/07/2012   Procedure: PACEMAKER GENERATOR CHANGE;  Surgeon: [name], MD;  Location: [facility] CATH LAB;  Service: Cardiovascular;  Laterality: N/A;   PACEMAKER PLACEMENT     PARTIAL HIP ARTHROPLASTY     2008   TOTAL HIP ARTHROPLASTY Left 01/07/2022   Procedure: TOTAL HIP ARTHROPLASTY ANTERIOR APPROACH;  Surgeon: [name], MD;  Location: [facility];  Service: Orthopedics;  Laterality: Left;     Current Outpatient Medications:    apixaban (ELIQUIS) 5 MG TABS tablet, Take 1 tablet by mouth twice daily, Disp: 180 tablet, Rfl: 1   Cholecalciferol (VITAMIN D) 125 MCG (5000 UT) CAPS, Take 5,000 Units by mouth in the morning., Disp: , Rfl:    Cyanocobalamin (VITAMIN B-12 PO), Take 2 capsules by mouth daily., Disp: , Rfl:    famotidine (PEPCID) 20 MG tablet, Take 1 tablet (20 mg total) by mouth at bedtime., Disp: , Rfl:    lisinopril (ZESTRIL) 2.5 MG tablet, Take 1 tablet (2.5 mg total) by mouth daily. Do not fill until patient calls please., Disp: 90 tablet, Rfl: 3   Multiple Vitamins-Minerals (Banks), Take 1 tablet by mouth in the morning and at bedtime., Disp: , Rfl:  omeprazole (PRILOSEC) 40 MG capsule, Take 1 capsule (40 mg total) by mouth 2 (two) times daily with a meal., Disp: 180 capsule, Rfl: 1   verapamil (CALAN-SR) 240 MG CR tablet, Take 1 tablet (240 mg total) by mouth daily., Disp: 90 tablet, Rfl: 3   terbinafine (LAMISIL AT) 1 % cream, Apply 1 application topically 2 (two) times daily. (Patient not taking: Reported on 06/27/2022), Disp: 42 g, Rfl: 1:    No Known Allergies:  FH: Father with prostate cancer; brother with prostate cancer.  SOCIAL HISTORY: He lives in [place].  He is married.  He has 2 children.  He is a retired  Set designer.  No tobacco use.  No alcohol in 30 years.  He was never a heavy drinker.  Review of Systems: In general he feels well.  No fevers or sweats.  No recent or recurrent infections.  He has a good appetite.  Some weight loss which he is not concerned about.  He denies bleeding.  Some dyspnea on exertion.  Periodic cough.  No dysphagia.  No change in bowel habits.  No bloody or black stools.  No urinary symptoms.  No hematuria.  Physical Exam:  Blood pressure 120/67, pulse 60, temperature 98.1 F (36.7 C), temperature source Oral, resp. rate 20, height 5' 8" (1.727 m), weight 149 lb 12.8 oz (67.9 kg), SpO2 95 %.  Lungs: Distant breath sounds.  No respiratory distress. Cardiac: Irregular. Abdomen: No hepatosplenomegaly. Vascular: No leg edema. Lymph nodes: No palpable cervical, supraclavicular or inguinal lymph nodes. Neurologic: Alert and oriented. Skin: No rash.   LABS:   Recent Labs    06/27/22 1109  WBC 4.8  HGB 13.5  HCT 40.2  PLT 133*  Peripheral blood smear-polychromasia not increased, no nucleated red blood cells, ovalocytes, numerous acanthocytes; white blood cell morphology unremarkable; platelets appear mildly decreased, few small platelet clumps  No results for input(s): "NA", "K", "CL", "CO2", "GLUCOSE", "BUN", "CREATININE", "CALCIUM" in the last 72 hours.    RADIOLOGY:  No results found.  Assessment and Plan:   Anemia Chronic mild thrombocytopenia Left total hip replacement February 2023  A-fib on anticoagulation Hypertension Pacemaker Sleep apnea  Eric Lambert was referred for evaluation of anemia.  Hemoglobin today is in normal range.  The recent mild anemia could in part be related to the hip surgery in February.  We also discussed the possibility of MDS or a lymphoproliferative disorder.  We are obtaining additional labs to include a myeloma panel, light chains, iron studies.    He has chronic mild thrombocytopenia dating to at  least 2008.  Platelet count today is stable.  At present he is scheduled to return for lab and an office visit in 9 months.  We will adjust accordingly pending results of outstanding labs.  Patient seen with Dr. Benay Spice  The peripheral blood smear was reviewed after he left the office.  Per his request I left a message on his voicemail regarding the blood smear and plan going forward.  Ned Card, NP 06/27/2022, 12:24 PM   This was a shared visit with Ned Card.  Eric Lambert is referred for evaluation of mild anemia.  He has a history of chronic mild thrombocytopenia. There are striking Red cell changes on the peripheral blood smear.  The most likely differential diagnosis is myelodysplasia.  He does not have a history of liver disease or anorexia.  We will obtain additional hematologic evaluation today.  We will consider a diagnostic bone marrow biopsy if  he develops progressive anemia, thrombocytopenia, or a new hematologic abnormality.  I was present for greater than 50% of today's visit.  I performed medical decision making.  Julieanne Manson, MD

## 2022-06-27 ENCOUNTER — Inpatient Hospital Stay: Payer: Medicare HMO | Admitting: Nurse Practitioner

## 2022-06-27 ENCOUNTER — Encounter: Payer: Self-pay | Admitting: Nurse Practitioner

## 2022-06-27 ENCOUNTER — Inpatient Hospital Stay: Payer: Medicare HMO | Attending: Oncology

## 2022-06-27 ENCOUNTER — Other Ambulatory Visit: Payer: Self-pay | Admitting: Oncology

## 2022-06-27 VITALS — BP 120/67 | HR 60 | Temp 98.1°F | Resp 20 | Ht 68.0 in | Wt 149.8 lb

## 2022-06-27 DIAGNOSIS — I509 Heart failure, unspecified: Secondary | ICD-10-CM | POA: Diagnosis not present

## 2022-06-27 DIAGNOSIS — Z7901 Long term (current) use of anticoagulants: Secondary | ICD-10-CM | POA: Diagnosis not present

## 2022-06-27 DIAGNOSIS — D649 Anemia, unspecified: Secondary | ICD-10-CM

## 2022-06-27 DIAGNOSIS — D696 Thrombocytopenia, unspecified: Secondary | ICD-10-CM | POA: Diagnosis not present

## 2022-06-27 DIAGNOSIS — I110 Hypertensive heart disease with heart failure: Secondary | ICD-10-CM | POA: Diagnosis not present

## 2022-06-27 LAB — CBC WITH DIFFERENTIAL (CANCER CENTER ONLY)
Abs Immature Granulocytes: 0.01 10*3/uL (ref 0.00–0.07)
Basophils Absolute: 0 10*3/uL (ref 0.0–0.1)
Basophils Relative: 0 %
Eosinophils Absolute: 0.1 10*3/uL (ref 0.0–0.5)
Eosinophils Relative: 2 %
HCT: 40.2 % (ref 39.0–52.0)
Hemoglobin: 13.5 g/dL (ref 13.0–17.0)
Immature Granulocytes: 0 %
Lymphocytes Relative: 28 %
Lymphs Abs: 1.4 10*3/uL (ref 0.7–4.0)
MCH: 29.6 pg (ref 26.0–34.0)
MCHC: 33.6 g/dL (ref 30.0–36.0)
MCV: 88.2 fL (ref 80.0–100.0)
Monocytes Absolute: 0.5 10*3/uL (ref 0.1–1.0)
Monocytes Relative: 10 %
Neutro Abs: 2.9 10*3/uL (ref 1.7–7.7)
Neutrophils Relative %: 60 %
Platelet Count: 133 10*3/uL — ABNORMAL LOW (ref 150–400)
RBC: 4.56 MIL/uL (ref 4.22–5.81)
RDW: 14.9 % (ref 11.5–15.5)
WBC Count: 4.8 10*3/uL (ref 4.0–10.5)
nRBC: 0 % (ref 0.0–0.2)

## 2022-06-27 LAB — IRON AND TIBC
Iron: 75 ug/dL (ref 45–182)
Saturation Ratios: 25 % (ref 17.9–39.5)
TIBC: 298 ug/dL (ref 250–450)
UIBC: 223 ug/dL

## 2022-06-27 LAB — FERRITIN: Ferritin: 52 ng/mL (ref 24–336)

## 2022-06-27 LAB — SAVE SMEAR(SSMR), FOR PROVIDER SLIDE REVIEW

## 2022-06-30 ENCOUNTER — Telehealth: Payer: Self-pay

## 2022-06-30 LAB — KAPPA/LAMBDA LIGHT CHAINS
Kappa free light chain: 22.8 mg/L — ABNORMAL HIGH (ref 3.3–19.4)
Kappa, lambda light chain ratio: 1.9 — ABNORMAL HIGH (ref 0.26–1.65)
Lambda free light chains: 12 mg/L (ref 5.7–26.3)

## 2022-06-30 NOTE — Telephone Encounter (Signed)
VM from pt requesting call back regarding "a missed call from your office". Upon returning call, no answer. VM left for pt stating that it was probably an automated call from the cancer center as no one reports having called him. Instructed pt to call [facility] with further concerns/questions.

## 2022-07-01 ENCOUNTER — Telehealth: Payer: Self-pay

## 2022-07-01 NOTE — Telephone Encounter (Signed)
Eric Lambert called in about his lab result, I advised the patient will Dr. Benay Spice review his result, we will give him call. Patient gave verbal understanding and had no further questions at this time

## 2022-07-02 LAB — MULTIPLE MYELOMA PANEL, SERUM
Albumin SerPl Elph-Mcnc: 4 g/dL (ref 2.9–4.4)
Albumin/Glob SerPl: 1.7 (ref 0.7–1.7)
Alpha 1: 0.2 g/dL (ref 0.0–0.4)
Alpha2 Glob SerPl Elph-Mcnc: 0.6 g/dL (ref 0.4–1.0)
B-Globulin SerPl Elph-Mcnc: 0.8 g/dL (ref 0.7–1.3)
Gamma Glob SerPl Elph-Mcnc: 0.8 g/dL (ref 0.4–1.8)
Globulin, Total: 2.4 g/dL (ref 2.2–3.9)
IgA: 189 mg/dL (ref 61–437)
IgG (Immunoglobin G), Serum: 1028 mg/dL (ref 603–1613)
IgM (Immunoglobulin M), Srm: 36 mg/dL (ref 15–143)
Total Protein ELP: 6.4 g/dL (ref 6.0–8.5)

## 2022-07-10 ENCOUNTER — Ambulatory Visit (HOSPITAL_COMMUNITY): Admit: 2022-07-10 | Payer: Medicare HMO | Admitting: Gastroenterology

## 2022-07-10 ENCOUNTER — Encounter (HOSPITAL_COMMUNITY): Payer: Self-pay

## 2022-07-10 SURGERY — ESOPHAGOGASTRODUODENOSCOPY (EGD) WITH PROPOFOL
Anesthesia: Monitor Anesthesia Care

## 2022-07-21 ENCOUNTER — Telehealth: Payer: Self-pay | Admitting: *Deleted

## 2022-07-21 NOTE — Telephone Encounter (Signed)
Patient left VM that he called yesterday requesting a call re: his lab results from 06/27/22. Reports he will come into office today and wait in lobby till someone speaks with him. @ 1105: Called patient and left VM that it may be a long wait if he comes in without an appointment. Encouraged him to stay home and will be called today.

## 2022-07-21 NOTE — Telephone Encounter (Signed)
MD reviewed labs with the following comments: Labs look good No evidence of myeloma Mild nonspecific elevation of light chains Follow up as scheduled.  Patient notified via voice mail and hardcopy mailed to his home with MD comments.

## 2022-07-30 ENCOUNTER — Ambulatory Visit (INDEPENDENT_AMBULATORY_CARE_PROVIDER_SITE_OTHER): Payer: Medicare HMO

## 2022-07-30 DIAGNOSIS — I495 Sick sinus syndrome: Secondary | ICD-10-CM

## 2022-07-30 LAB — CUP PACEART REMOTE DEVICE CHECK
Battery Remaining Longevity: 12 mo
Battery Remaining Percentage: 16 %
Brady Statistic RV Percent Paced: 75 %
Date Time Interrogation Session: 20230906031000
Implantable Lead Implant Date: 19960809
Implantable Lead Location: 753860
Implantable Lead Model: 4285
Implantable Lead Serial Number: 209144
Implantable Pulse Generator Implant Date: 20130614
Lead Channel Impedance Value: 834 Ohm
Lead Channel Pacing Threshold Amplitude: 1 V
Lead Channel Pacing Threshold Pulse Width: 0.4 ms
Lead Channel Setting Pacing Amplitude: 1.3 V
Lead Channel Setting Pacing Pulse Width: 0.4 ms
Lead Channel Setting Sensing Sensitivity: 2.5 mV
Pulse Gen Serial Number: 115653

## 2022-07-31 ENCOUNTER — Telehealth: Payer: Self-pay | Admitting: *Deleted

## 2022-07-31 NOTE — Patient Outreach (Signed)
  Care Coordination   07/31/2022 Name: Eric Lambert MRN: [id number] DOB: 06/20/1929   Care Coordination Outreach Attempts:  An unsuccessful telephone outreach was attempted today to offer the patient information about available care coordination services as a benefit of their health plan.   Follow Up Plan:  Additional outreach attempts will be made to offer the patient care coordination information and services.   Encounter Outcome:  No Answer  Care Coordination Interventions Activated:  No   Care Coordination Interventions:  No, not indicated    Raina Mina, RN Care Management Coordinator [place] Office [phone]

## 2022-08-18 ENCOUNTER — Encounter: Payer: Self-pay | Admitting: *Deleted

## 2022-08-18 NOTE — Progress Notes (Signed)
Remote pacemaker transmission.   

## 2022-08-21 ENCOUNTER — Telehealth: Payer: Self-pay | Admitting: *Deleted

## 2022-08-21 NOTE — Patient Outreach (Signed)
  Care Coordination   08/21/2022 Name: Rik Wadel MRN: [id number] DOB: 1929/07/20   Care Coordination Outreach Attempts:  A second unsuccessful outreach was attempted today to offer the patient with information about available care coordination services as a benefit of their health plan.     Follow Up Plan:  Additional outreach attempts will be made to offer the patient care coordination information and services.   Encounter Outcome:  No Answer  Care Coordination Interventions Activated:  No   Care Coordination Interventions:  No, not indicated    Raina Mina, RN Care Management Coordinator [place] Office [phone]

## 2022-08-27 ENCOUNTER — Ambulatory Visit: Payer: Medicare HMO | Admitting: Gastroenterology

## 2022-08-28 IMAGING — CT CT HIP*L* W/O CM
2 series · 12 of 16 positions shown, 14 images · non-contrast
Comparison: X-ray hip 07/18/2019; CT pelvis 08/11/2013.

CLINICAL DATA: Left hip pain for 1 year.

EXAM:
CT OF THE LEFT HIP WITHOUT CONTRAST
TECHNIQUE: Multidetector CT imaging of the left hip was performed according to
the standard protocol. Multiplanar CT image reconstructions were
also generated.

[Series 3: bone lower extremity · axial · 0.51mm/px · z∈[+623,+839]mm · 6 of 152 slices shown]
[im 22/152  bone]
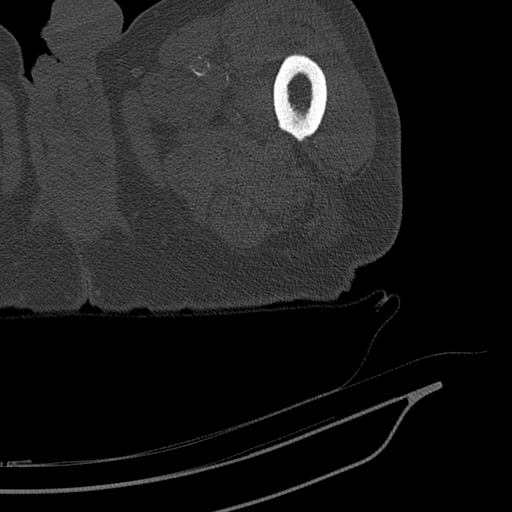
[im 44/152  bone]
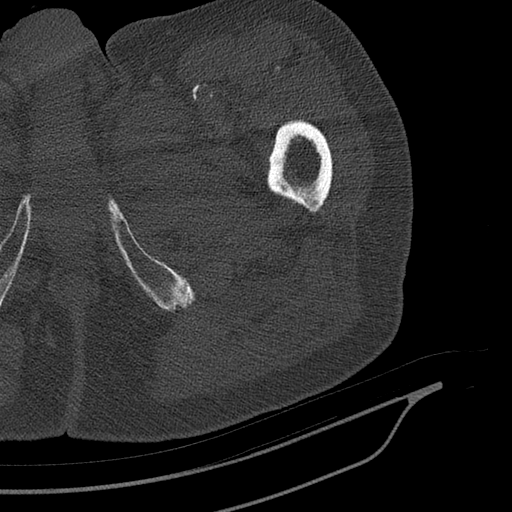
[im 65/152  bone]
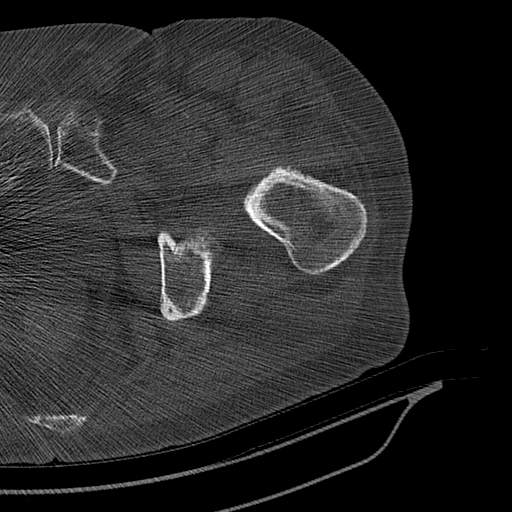
[im 87/152  bone]
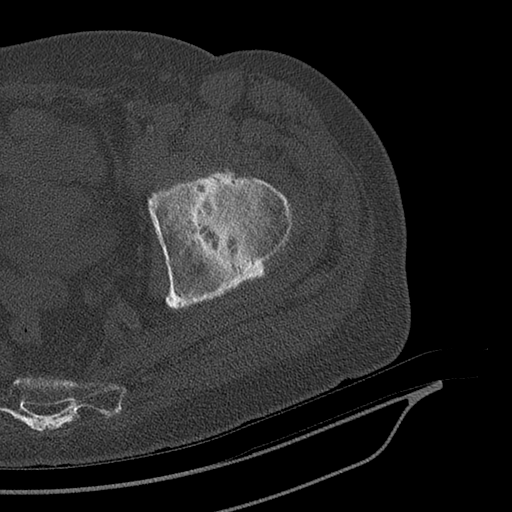
[im 108/152  bone]
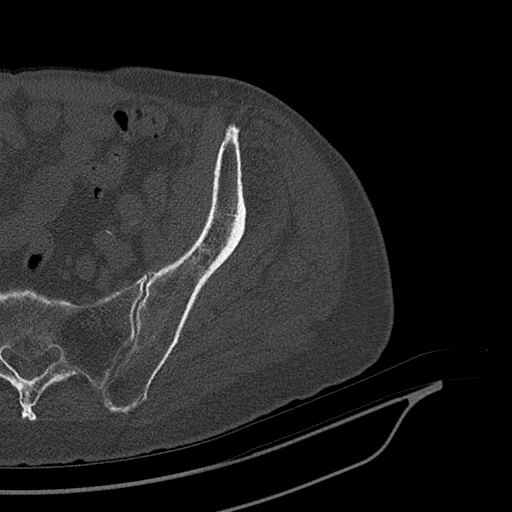
[im 130/152  bone]
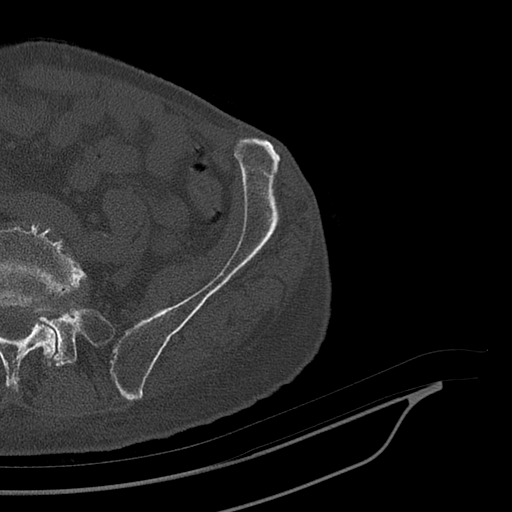

[Series 4: soft tissue lower extremity · axial · 0.51mm/px · z∈[+629,+841]mm · 6 of 148 slices shown, 8 images]
[im 22/148  soft-tissue]
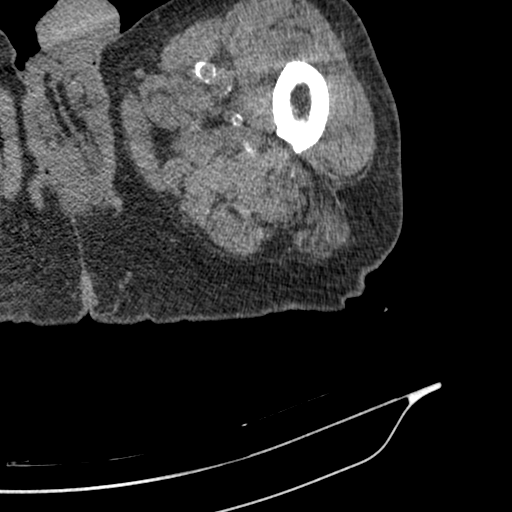
[im 22/148  bone]
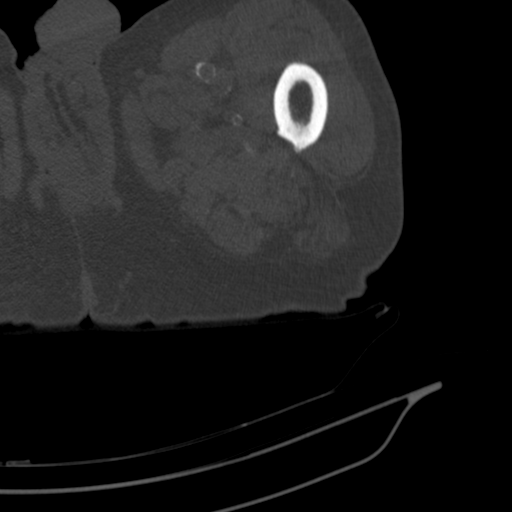
[im 43/148  soft-tissue]
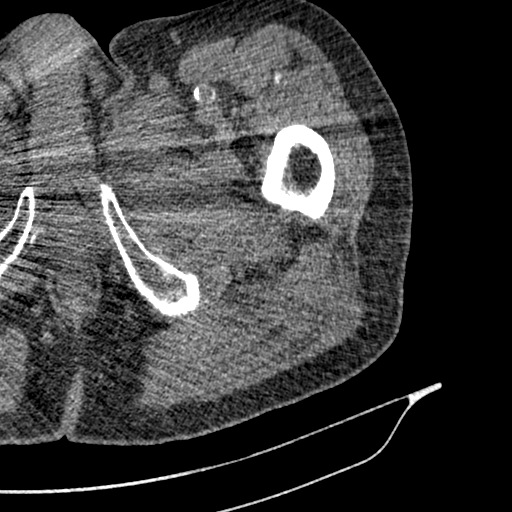
[im 64/148  soft-tissue]
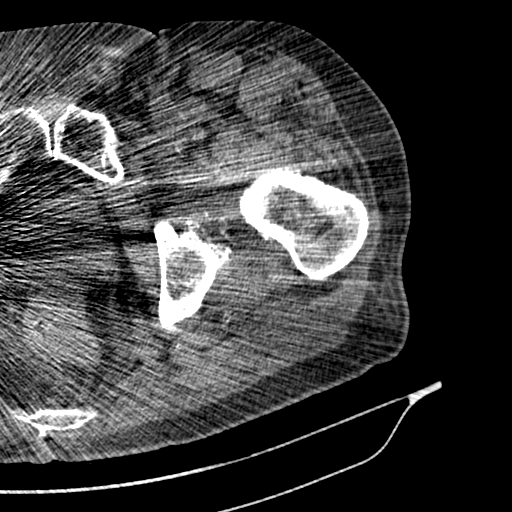
[im 85/148  soft-tissue]
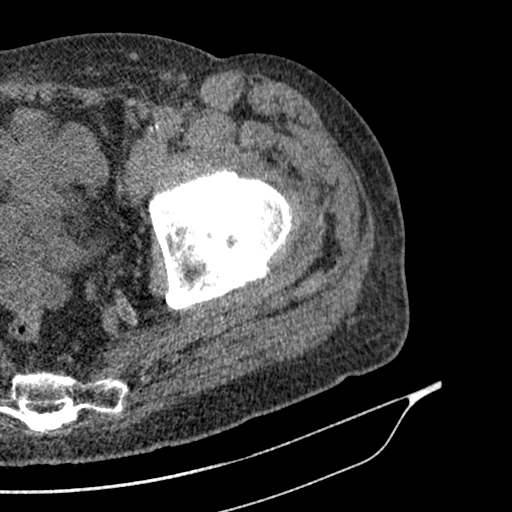
[im 106/148  soft-tissue]
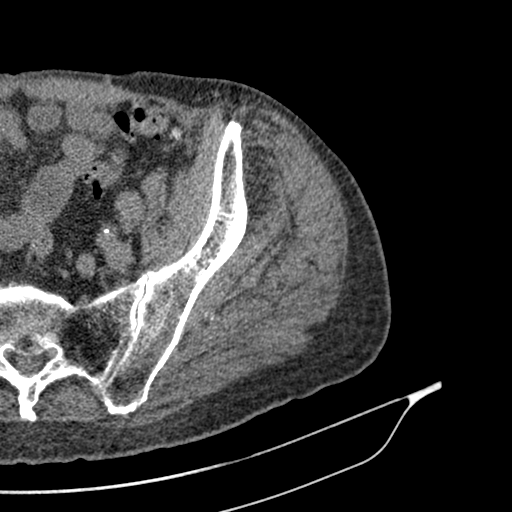
[im 106/148  bone]
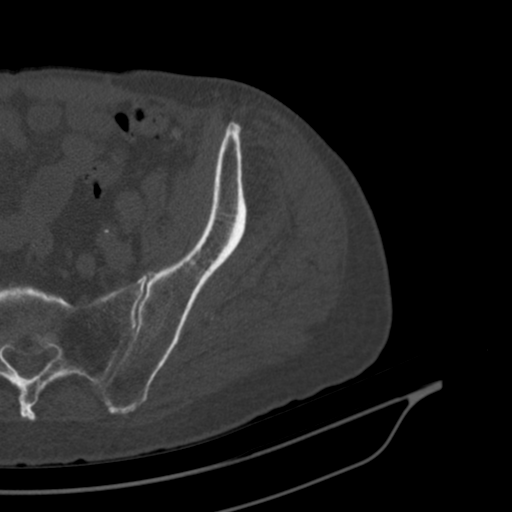
[im 127/148  soft-tissue]
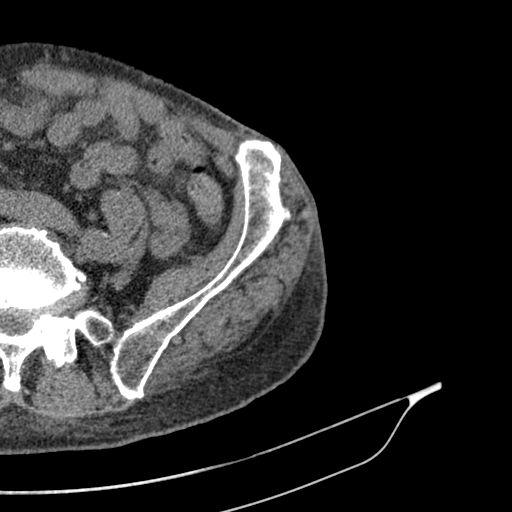

[12 of 16 positions shown; findings below may reference images not displayed]

FINDINGS: Bones/Joint/Cartilage

No fracture or dislocation. Normal alignment. No joint effusion.

Severe osteoarthritis of the left hip with a bone-on-bone
appearance, subchondral sclerosis, subchondral cystic changes and
small marginal osteophytes. Large enthesophyte versus heterotopic
ossification at the left anterior inferior iliac spine. Mild
osteoarthritis of the left SI joint.

Degenerative disease with disc height loss at L4-5 with moderate
left facet arthropathy and mild left foraminal stenosis.
Degenerative disease with mild disc height loss at L5-S1 with severe
left facet arthropathy and moderate left foraminal stenosis.

Ligaments

Ligaments are suboptimally evaluated by CT.

Muscles and Tendons
Muscles are normal. No muscle atrophy. No intramuscular fluid
collection or hematoma.

Soft tissue
No fluid collection or hematoma. No soft tissue mass. Peripheral
vascular atherosclerotic disease.
IMPRESSION: 1.  No acute osseous injury of the left hip.
2. Severe osteoarthritis of the left hip.
3. Lower lumbar spine spondylosis partially visualized.

## 2022-09-01 ENCOUNTER — Telehealth: Payer: Self-pay

## 2022-09-01 NOTE — Patient Outreach (Signed)
  Care Coordination   09/01/2022 Name: Eric Lambert MRN: [id number] DOB: [birth year]   Care Coordination Outreach Attempts:  A third unsuccessful outreach was attempted today to offer the patient with information about available care coordination services as a benefit of their health plan.   Follow Up Plan:  No further outreach attempts will be made at this time. We have been unable to contact the patient to offer or enroll patient in care coordination services  Encounter Outcome:  No Answer  Care Coordination Interventions Activated:  No   Care Coordination Interventions:  No, not indicated    Peter Garter RN, BSN,CCM, [place] [facility] [phone]

## 2022-09-10 DIAGNOSIS — Z961 Presence of intraocular lens: Secondary | ICD-10-CM | POA: Diagnosis not present

## 2022-09-10 DIAGNOSIS — H353132 Nonexudative age-related macular degeneration, bilateral, intermediate dry stage: Secondary | ICD-10-CM | POA: Diagnosis not present

## 2022-09-10 DIAGNOSIS — H52203 Unspecified astigmatism, bilateral: Secondary | ICD-10-CM | POA: Diagnosis not present

## 2022-09-30 ENCOUNTER — Other Ambulatory Visit: Payer: Self-pay | Admitting: Family Medicine

## 2022-10-03 ENCOUNTER — Other Ambulatory Visit: Payer: Self-pay | Admitting: Internal Medicine

## 2022-10-03 DIAGNOSIS — I4821 Permanent atrial fibrillation: Secondary | ICD-10-CM

## 2022-10-03 NOTE — Telephone Encounter (Signed)
Eliquis '5mg'$  refill request received. Patient is 88 years old, weight-67.9kg, Crea- 1.14 on 05/09/2022, Diagnosis-Afib, and last seen by Dr. Caryl Comes on 04/16/2022. Dose is appropriate based on dosing criteria. Will send in refill to requested pharmacy.

## 2022-10-29 ENCOUNTER — Ambulatory Visit (INDEPENDENT_AMBULATORY_CARE_PROVIDER_SITE_OTHER): Payer: Medicare HMO

## 2022-10-29 DIAGNOSIS — I495 Sick sinus syndrome: Secondary | ICD-10-CM | POA: Diagnosis not present

## 2022-10-29 LAB — CUP PACEART REMOTE DEVICE CHECK
Battery Remaining Longevity: 9 mo
Battery Remaining Percentage: 11 %
Brady Statistic RV Percent Paced: 74 %
Date Time Interrogation Session: 20231206031100
Implantable Lead Connection Status: 753985
Implantable Lead Implant Date: 19960809
Implantable Lead Location: 753860
Implantable Lead Model: 4285
Implantable Lead Serial Number: 209144
Implantable Pulse Generator Implant Date: 20130614
Lead Channel Impedance Value: 828 Ohm
Lead Channel Pacing Threshold Amplitude: 0.9 V
Lead Channel Pacing Threshold Pulse Width: 0.4 ms
Lead Channel Setting Pacing Amplitude: 1.3 V
Lead Channel Setting Pacing Pulse Width: 0.4 ms
Lead Channel Setting Sensing Sensitivity: 2.5 mV
Pulse Gen Serial Number: 115653
Zone Setting Status: 755011

## 2022-11-06 ENCOUNTER — Encounter: Payer: Self-pay | Admitting: *Deleted

## 2022-11-07 ENCOUNTER — Encounter: Payer: Self-pay | Admitting: Family Medicine

## 2022-11-07 ENCOUNTER — Ambulatory Visit (INDEPENDENT_AMBULATORY_CARE_PROVIDER_SITE_OTHER): Payer: Medicare HMO | Admitting: Family Medicine

## 2022-11-07 VITALS — BP 112/64 | HR 65 | Temp 97.4°F | Ht 68.0 in | Wt 154.2 lb

## 2022-11-07 DIAGNOSIS — K219 Gastro-esophageal reflux disease without esophagitis: Secondary | ICD-10-CM | POA: Diagnosis not present

## 2022-11-07 DIAGNOSIS — I700 Atherosclerosis of aorta: Secondary | ICD-10-CM

## 2022-11-07 DIAGNOSIS — I4821 Permanent atrial fibrillation: Secondary | ICD-10-CM | POA: Diagnosis not present

## 2022-11-07 DIAGNOSIS — I10 Essential (primary) hypertension: Secondary | ICD-10-CM | POA: Diagnosis not present

## 2022-11-07 DIAGNOSIS — R739 Hyperglycemia, unspecified: Secondary | ICD-10-CM

## 2022-11-07 DIAGNOSIS — D696 Thrombocytopenia, unspecified: Secondary | ICD-10-CM

## 2022-11-07 LAB — COMPREHENSIVE METABOLIC PANEL
ALT: 15 U/L (ref 0–53)
AST: 17 U/L (ref 0–37)
Albumin: 4 g/dL (ref 3.5–5.2)
Alkaline Phosphatase: 68 U/L (ref 39–117)
BUN: 32 mg/dL — ABNORMAL HIGH (ref 6–23)
CO2: 31 mEq/L (ref 19–32)
Calcium: 9.4 mg/dL (ref 8.4–10.5)
Chloride: 101 mEq/L (ref 96–112)
Creatinine, Ser: 1.31 mg/dL (ref 0.40–1.50)
GFR: 47.63 mL/min — ABNORMAL LOW (ref >60.00)
Glucose, Bld: 106 mg/dL — ABNORMAL HIGH (ref 70–99)
Potassium: 4.9 mEq/L (ref 3.5–5.1)
Sodium: 138 mEq/L (ref 135–145)
Total Bilirubin: 0.6 mg/dL (ref 0.2–1.2)
Total Protein: 6.2 g/dL (ref 6.0–8.3)

## 2022-11-07 LAB — CBC WITH DIFFERENTIAL/PLATELET
Basophils Absolute: 0 10*3/uL (ref 0.0–0.1)
Basophils Relative: 0.9 % (ref 0.0–3.0)
Eosinophils Absolute: 0.2 10*3/uL (ref 0.0–0.7)
Eosinophils Relative: 3.4 % (ref 0.0–5.0)
HCT: 40.6 % (ref 39.0–52.0)
Hemoglobin: 13.5 g/dL (ref 13.0–17.0)
Lymphocytes Relative: 25.7 % (ref 12.0–46.0)
Lymphs Abs: 1.3 10*3/uL (ref 0.7–4.0)
MCHC: 33.3 g/dL (ref 30.0–36.0)
MCV: 91 fl (ref 78.0–100.0)
Monocytes Absolute: 0.5 10*3/uL (ref 0.1–1.0)
Monocytes Relative: 9.4 % (ref 3.0–12.0)
Neutro Abs: 3.1 10*3/uL (ref 1.4–7.7)
Neutrophils Relative %: 60.6 % (ref 43.0–77.0)
Platelets: 137 10*3/uL — ABNORMAL LOW (ref 150.0–400.0)
RBC: 4.46 Mil/uL (ref 4.22–5.81)
RDW: 13.8 % (ref 11.5–15.5)
WBC: 5 10*3/uL (ref 4.0–10.5)

## 2022-11-07 LAB — HEMOGLOBIN A1C: Hgb A1c MFr Bld: 5.6 % (ref 4.6–6.5)

## 2022-11-07 LAB — LDL CHOLESTEROL, DIRECT: Direct LDL: 56 mg/dL

## 2022-11-07 NOTE — Progress Notes (Signed)
Phone [phone] In person visit   Subjective:   Eric Lambert is a 88 y.o. year old very pleasant male patient who presents for/with See problem oriented charting Chief Complaint  Patient presents with   Follow-up   Hypertension   Past Medical History-  Patient Active Problem List   Diagnosis Date Noted   PPM-[employer] 02/07/2009    Priority: High   Permanent atrial fibrillation ([place]) 10/21/2007    Priority: High   Hyperglycemia 11/07/2022    Priority: Medium    Esophageal obstruction due to food impaction     Priority: Medium    Aortic atherosclerosis ([place]) 08/13/2020    Priority: Medium    BPPV (benign paroxysmal positional vertigo) 12/10/2017    Priority: Medium    Bradycardia 02/02/2013    Priority: Medium    Thrombocytopenia ([place]) 08/04/2011    Priority: Medium    Obstructive sleep apnea 10/21/2007    Priority: Medium    Essential hypertension 10/21/2007    Priority: Medium    GERD 10/21/2007    Priority: Medium    BPH associated with nocturia 10/21/2007    Priority: Medium    Erectile dysfunction 05/28/2016    Priority: Low   Macular degeneration 05/28/2016    Priority: Low   Right hip pain 08/04/2013    Priority: Low   Secondary cardiomyopathy ([place]) 02/07/2009    Priority: Low   Osteopenia 10/22/2007    Priority: Low   SUBACUTE BACTERIAL ENDOCARDITIS 10/21/2007    Priority: Low   S/P total left hip arthroplasty 01/07/2022   Degenerative disc disease, lumbar 07/18/2019   Chronic low back pain 06/09/2018   Right knee pain 06/02/2017    Medications- reviewed and updated Current Outpatient Medications  Medication Sig Dispense Refill   apixaban (ELIQUIS) 5 MG TABS tablet Take 1 tablet by mouth twice daily 180 tablet 1   Cholecalciferol (VITAMIN D) 125 MCG (5000 UT) CAPS Take 5,000 Units by mouth in the morning.     Cyanocobalamin (VITAMIN B-12 PO) Take 2 capsules by mouth daily.     famotidine (PEPCID) 20 MG tablet Take 1 tablet  (20 mg total) by mouth at bedtime.     lisinopril (ZESTRIL) 2.5 MG tablet Take 1 tablet by mouth once daily 90 tablet 0   Multiple Vitamins-Minerals (OCUVITE EYE HEALTH FORMULA PO) Take 1 tablet by mouth in the morning and at bedtime.     omeprazole (PRILOSEC) 40 MG capsule Take 1 capsule (40 mg total) by mouth 2 (two) times daily with a meal. 180 capsule 1   verapamil (CALAN-SR) 240 MG CR tablet Take 1 tablet (240 mg total) by mouth daily. 90 tablet 3   No current facility-administered medications for this visit.     Objective:  BP 112/64   Pulse 65   Temp (!) 97.4 F (36.3 C)   Ht _0  (1.727 m)   Wt 154 lb 3.2 oz (69.9 kg)   SpO2 98%   BMI 23.45 kg/m  Gen: NAD, resting comfortably CV: RRR no murmurs rubs or gallops Lungs: CTAB no crackles, wheeze, rhonchi Ext: no edema Skin: warm, dry     Assessment and Plan   # Anemia/thrombocytopenia S:[facility] hematology. Note from Dr. Benay Spice "Mr. Krabill is referred for evaluation of mild anemia.  He has a history of chronic mild thrombocytopenia. There are striking Red cell changes on the peripheral blood smear.  The most likely differential diagnosis is myelodysplasia.  He does not have a history of liver disease  or anorexia.  We will obtain additional hematologic evaluation today.  We will consider a diagnostic bone marrow biopsy if he develops progressive anemia, thrombocytopenia, or a new hematologic abnormality."  A/P: plan was for 9 month follow up with hematology- we will also recheck CBC with differential today   # Atrial fibrillation-cardiology Dr. Caryl Comes #Bradycardia-patient with pacemaker in place S: Rate controlled with verapamil 240 mg extended release  Anticoagulated with Eliquis 5 mg twice daily.   Denies recent falls   A/P: appropriately anticoagulated and rate controlled- continue current medicine   #hypertension #Secondary cardiomyopathy-tachycardia induced in the past due to A-fib before rate control S: medication:  Lisinopril 2.5 mg, verapamil 240 mg daily -working out in the yard still -no edema. Mild weight gain but trying to improve intake BP Readings from Last 3 Encounters:  11/07/22 112/64  06/27/22 120/67  06/03/22 116/60  A/P: stable x2 - continue current medicines   #hyperlipidemia #Aortic atherosclerosis S: Medication: None- but LDL under 70 Lab Results  Component Value Date   CHOL 146 11/11/2021   HDL 81.60 11/11/2021   LDLCALC 54 11/11/2021   TRIG 51.0 11/11/2021   CHOLHDL 2 11/11/2021   A/P: aortic atherosclerosis - but LDL under 70- we opted to update this but continue current medicatoins    # Hyperglycemia/insulin resistance/prediabetes- peak a1c 5.8 S:  Medication: none Exercise and diet- eating more cookies lately Lab Results  Component Value Date   HGBA1C 5.6 05/09/2022   HGBA1C 5.8 07/19/2021   HGBA1C 4.9 10/22/2007  A/P: update a1c with labs today to make sure not worsening   # GERD S:Medication:  omeprazole 40 mg BID- increased by Dr. Ardis Hughs back in July- seemed to help with his baseline reflux and perhaps mildly with cough. Also on pepcid at bedtime.  -but has an ongoing persistent cough at night. If closes mouth and says to himself dont cough seems to help some.  -luckily no recurrent food impaction issues after February visit. Is avoiding spicy foods and  A/P: GERD poor control despite aggressive therapy- encouraged follow up with GI even if Dr. Ardis Hughs not available right now -discussed possible x-ray but since cough mainly at night and seems to go along with GERD symptoms wanted to start with evaluating from GI perspective   Recommended follow up: Return in about 27 weeks (around 05/15/2023) for physical or sooner if needed.Schedule b4 you leave. Future Appointments  Date Time Provider [place]  01/28/2023  7:30 AM [facility] DEVICE REMOTES [facility]  03/27/2023 10:45 AM [facility] PHLEBOTOMIST [facility] None  03/27/2023 11:20 AM Ladell Pier, MD [facility] None  04/29/2023  7:30 AM [facility] DEVICE REMOTES [facility]  07/29/2023  7:30 AM [facility] DEVICE REMOTES [facility]  10/28/2023  7:30 AM [facility] DEVICE REMOTES [facility]  01/27/2024  7:30 AM [facility] DEVICE REMOTES [facility]   Lab/Order associations:   ICD-10-CM   1. Essential hypertension  I10     2. Hyperglycemia  R73.9     3. Permanent atrial fibrillation (HCC)  I48.21     4. Gastroesophageal reflux disease without esophagitis  K21.9     5. Thrombocytopenia ([place])  D69.6     6. Aortic atherosclerosis (HCC)  I70.0       No orders of the defined types were placed in this encounter.   Return precautions advised.  Garret Reddish, MD

## 2022-11-07 NOTE — Patient Instructions (Addendum)
Let us know when you get your RSV and SHINGRIX vaccines at the pharmacy.  GERD poor control despite aggressive therapy- encouraged follow up with GI even if Dr. Ardis Hughs not available right now -discussed possible x-ray but since cough mainly at night and seems to go along with GERD symptoms wanted to start with evaluating from GI perspective   Please stop by lab before you go If you have mychart- we will send your results within 3 business days of [place] receiving them.  If you do not have mychart- we will call you about results within 5 business days of [place] receiving them.  *please also note that you will see labs on mychart as soon as they post. I will later go in and write notes on them- will say "notes from Dr. Yong Channel"   Recommended follow up: Return in about 27 weeks (around 05/15/2023) for physical or sooner if needed.Schedule b4 you leave.

## 2022-11-21 NOTE — Progress Notes (Signed)
Remote pacemaker transmission.   

## 2022-12-09 ENCOUNTER — Ambulatory Visit: Payer: Medicare HMO | Admitting: Physician Assistant

## 2022-12-09 ENCOUNTER — Encounter: Payer: Self-pay | Admitting: Physician Assistant

## 2022-12-09 VITALS — BP 122/68 | HR 82 | Ht 69.0 in | Wt 157.0 lb

## 2022-12-09 DIAGNOSIS — K219 Gastro-esophageal reflux disease without esophagitis: Secondary | ICD-10-CM | POA: Diagnosis not present

## 2022-12-09 MED ORDER — OMEPRAZOLE 40 MG PO CPDR: DELAYED_RELEASE_CAPSULE | ORAL | 1 refills | Status: DC

## 2022-12-09 MED ORDER — FAMOTIDINE 40 MG PO TABS: 40.0000 mg | ORAL_TABLET | Freq: Every day | ORAL | 1 refills | Status: DC

## 2022-12-09 NOTE — Patient Instructions (Addendum)
Elevate the head of your bed on blocks. Don't eat 4 hours before going to bed   Follow up as needed   If your blood pressure at your visit was 140/90 or greater, please contact your primary care physician to follow up on this.   If you are age 87 or older, your body mass index should be between 23-30. Your Body mass index is 23.18 kg/m. If this is out of the aforementioned range listed, please consider follow up with your Primary Care Provider.  If you are age 68 or younger, your body mass index should be between 19-25. Your Body mass index is 23.18 kg/m. If this is out of the aformentioned range listed, please consider follow up with your Primary Care Provider.   ________________________________________________________  The [place] [facility] providers would like to encourage you to use [facility] to communicate with providers for non-urgent requests or questions.  Due to long hold times on the telephone, sending your provider a message by [facility] may be a faster and more efficient way to get a response.  Please allow 48 business hours for a response.  Please remember that this is for  non-urgent requests.   Thank you for entrusting me with your care and choosing [facility].  Ellouise Newer PA-C

## 2022-12-09 NOTE — Progress Notes (Signed)
Chief Complaint: GERD  Review of pertinent gastrointestinal problems: 1.  Dysphagia evaluation 2012. He was having very rare intermittent dysphagia.  Barium esophagram in 2017 was essentially normal.  I repeated the barium esophagram for him in 2012 and it was also normal.  Esophageal food impaction 12/2021.  This occurred while he was hospitalized for elective hip replacement.  EGD 12/2021 Dr. 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  He told him to be on proton pump inhibitor twice daily and return to see me in the office to consider repeat endoscopy.  Unfortunately he was not put on those proton pump inhibitors and I saw him in the office 02/2022, I started him on omeprazole 40 mg before breakfast and Pepcid at bedtime      HPI:    Mr. Hur is a 89 year old male with a past medical history as listed below including A-fib, CHF, GERD, OSA, complete heart block status post pacemaker on Eliquis, known to Dr. Ardis Hughs, who was referred to me by Marin Olp, MD for a complaint of GERD.      03/2022 barium esophagram with difficulty initiating swallow with 13 mm barium tablet, prominent cricopharyngeus muscle impression upon the posterior aspect of the lower hypopharynx, delayed passage of the tablet suggestive of presence of mild smooth stricture.  Mild to moderate esophageal dysmotility with tertiary contractions and a small sliding hiatal hernia.  Trace GERD.    06/03/2022 office visit with Dr. Ardis Hughs, still bothered by dysphagia.  That time discussed an EGD but patient was worried about this given his increased risk given age and holding his blood thinner.  Instead his Omeprazole was increased to 40 mg twice a day.    11/07/2022 patient seen by family medicine and discussed reflux.  On Omeprazole 40 twice daily as well as Pepcid at bedtime.  Had ongoing persistent cough at night.    Today,  patient explains that he continues with some trouble with GERD and also a nocturnal cough.  Explains that his reflux is only very occasional on Omeprazole 40 mg twice a day and his cough is "either real or imaginary", because most of the time if he feels like he needs to cough and stops himself he does not.  This is typically at night when he lays down to sleep though.  He would like to avoid any "scarring" though from reflux and asks what other lifestyle modifications he can make in order to improve his life.  Does tell me that typically he has dinner around 6 and goes to sleep around 8 PM, he has been trying to alter his diet and even sit up a little longer but oftentimes does not make it.  Currently using his Omeprazole with breakfast and often forgets his nighttime dose before dinner so he will take it afterwards.  Also on Pepcid 20 mg and he lays down to sleep.  Tells me he is not very far from being comfortable with his symptoms.    Denies fever, chills, weight loss, blood in his stool, nausea, vomiting or abdominal pain.  Past Medical History:  Diagnosis Date   Allergy    Arthritis    Atrial fibrillation -permanent    BPH (benign prostatic hyperplasia)    Cancer (HCC)    HX OF SKIN CANCER    CHF (congestive heart failure) ([place])    resolved after pacemaker - tachycardia induced  Complete heart block (HCC)    Dyspnea    mild   ED (erectile dysfunction)    GERD (gastroesophageal reflux disease)    GLUCOSE INTOLERANCE 10/22/2007   no recent issues   Heart murmur    OSA (obstructive sleep apnea)    NO CPAP    Pacemaker BSX    dual   Pneumonia    HX OF SEVERAL TIMES AS A CHILD    PONV (postoperative nausea and vomiting)    at age 62    Presence of permanent cardiac pacemaker    SUBACUTE BACTERIAL ENDOCARDITIS 1970s    Past Surgical History:  Procedure Laterality Date   ESOPHAGOGASTRODUODENOSCOPY (EGD) WITH PROPOFOL N/A 01/07/2022   Procedure: ESOPHAGOGASTRODUODENOSCOPY (EGD) WITH  PROPOFOL;  Surgeon: Jerene Bears, MD;  Location: [facility];  Service: Gastroenterology;  Laterality: N/A;   IMPACTION REMOVAL  01/07/2022   Procedure: IMPACTION REMOVAL;  Surgeon: Jerene Bears, MD;  Location: [facility];  Service: Gastroenterology;;   INGUINAL HERNIA REPAIR Bilateral 04/08/2021   Procedure: LAPAROSCOPIC BILATERAL INGUINAL HERNIA REPAIR WITH MESH;  Surgeon: Kinsinger, Arta Bruce, MD;  Location: [facility];  Service: General;  Laterality: Bilateral;   INSERT / REPLACE / REMOVE PACEMAKER     PACEMAKER GENERATOR CHANGE N/A 05/07/2012   Procedure: PACEMAKER GENERATOR CHANGE;  Surgeon: [name], MD;  Location: [facility] CATH LAB;  Service: Cardiovascular;  Laterality: N/A;   PACEMAKER PLACEMENT     PARTIAL HIP ARTHROPLASTY     2008   TOTAL HIP ARTHROPLASTY Left 01/07/2022   Procedure: TOTAL HIP ARTHROPLASTY ANTERIOR APPROACH;  Surgeon: [name], MD;  Location: [facility];  Service: Orthopedics;  Laterality: Left;    Current Outpatient Medications  Medication Sig Dispense Refill   apixaban (ELIQUIS) 5 MG TABS tablet Take 1 tablet by mouth twice daily 180 tablet 1   Cholecalciferol (VITAMIN D) 125 MCG (5000 UT) CAPS Take 5,000 Units by mouth in the morning.     Cyanocobalamin (VITAMIN B-12 PO) Take 2 capsules by mouth daily.     famotidine (PEPCID) 20 MG tablet Take 1 tablet (20 mg total) by mouth at bedtime.     lisinopril (ZESTRIL) 2.5 MG tablet Take 1 tablet by mouth once daily 90 tablet 0   Multiple Vitamins-Minerals (OCUVITE EYE HEALTH FORMULA PO) Take 1 tablet by mouth in the morning and at bedtime.     omeprazole (PRILOSEC) 40 MG capsule Take 1 capsule (40 mg total) by mouth 2 (two) times daily with a meal. 180 capsule 1   verapamil (CALAN-SR) 240 MG CR tablet Take 1 tablet (240 mg total) by mouth daily. 90 tablet 3   No current facility-administered medications for this visit.    Allergies as of 12/09/2022   (No Known Allergies)    Family History  Problem  Relation Age of Onset   Prostate cancer Father    Diabetes Brother    Prostate cancer Brother    Brain cancer Brother    Heart disease Other        mothers side men- strokes and heart attacks   Colon cancer Neg Hx    Pancreatic cancer Neg Hx    Stomach cancer Neg Hx     Social History   Socioeconomic History   Marital status: Married    Spouse name: Not on file   Number of children: 2   Years of education: Not on file   Highest education level: Not on file  Occupational History   Occupation:  Training and development officer: RETIRED    Comment: Working part time  Tobacco Use   Smoking status: Never   Smokeless tobacco: Never  Vaping Use   Vaping Use: Never used  Substance and Sexual Activity   Alcohol use: Not Currently    Comment: hx of 10-15 years ago    Drug use: No   Sexual activity: Not on file  Other Topics Concern   Not on file  Social History Narrative   Married. 2 children. 1 grandkid. 1 greatgrandchild.       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, plays bridge on internet, Neurosurgeon, enjoys talking politics   Social Determinants of Radio broadcast assistant Strain: Not on Art therapist Insecurity: Not on file  Transportation Needs: Not on file  Physical Activity: Not on file  Stress: Not on file  Social Connections: Not on file  Intimate Partner Violence: Not on file    Review of Systems:    Constitutional: No weight loss, fever or chills Cardiovascular: No chest pain  Respiratory: No SOB  Gastrointestinal: See HPI and otherwise negative   Physical Exam:  Vital signs: BP 122/68   Pulse 82   Ht '5\' 9"'$  (1.753 m)   Wt 157 lb (71.2 kg)   SpO2 98%   BMI 23.18 kg/m   Constitutional:   Pleasant Elderly Caucasian male appears to be in NAD, Well developed, Well nourished, alert and cooperative Respiratory: Respirations even and unlabored. Lungs clear to auscultation bilaterally.   No wheezes, crackles, or  rhonchi.  Cardiovascular: Normal S1, S2. No MRG. Regular rate and rhythm. No peripheral edema, cyanosis or pallor.  Gastrointestinal:  Soft, nondistended, nontender. No rebound or guarding. Normal bowel sounds. No appreciable masses or hepatomegaly. Rectal:  Not performed.  Msk:  Symmetrical without gross deformities. Without edema, no deformity or joint abnormality. +ambulates with cane Psychiatric: Demonstrates good judgement and reason without abnormal affect or behaviors.  RELEVANT LABS AND IMAGING: CBC    Component Value Date/Time   WBC 5.0 11/07/2022 1344   RBC 4.46 11/07/2022 1344   HGB 13.5 11/07/2022 1344   HGB 13.5 06/27/2022 1109   HGB 14.3 10/07/2018 1425   HCT 40.6 11/07/2022 1344   HCT 43.9 10/07/2018 1425   PLT 137.0 (L) 11/07/2022 1344   PLT 133 (L) 06/27/2022 1109   PLT 169 10/07/2018 1425   MCV 91.0 11/07/2022 1344   MCV 92 10/07/2018 1425   MCH 29.6 06/27/2022 1109   MCHC 33.3 11/07/2022 1344   RDW 13.8 11/07/2022 1344   RDW 12.6 10/07/2018 1425   LYMPHSABS 1.3 11/07/2022 1344   LYMPHSABS 1.6 01/13/2017 1533   MONOABS 0.5 11/07/2022 1344   EOSABS 0.2 11/07/2022 1344   EOSABS 0.1 01/13/2017 1533   BASOSABS 0.0 11/07/2022 1344   BASOSABS 0.0 01/13/2017 1533    CMP     Component Value Date/Time   NA 138 11/07/2022 1344   NA 138 01/30/2020 1525   K 4.9 11/07/2022 1344   CL 101 11/07/2022 1344   CO2 31 11/07/2022 1344   GLUCOSE 106 (H) 11/07/2022 1344   GLUCOSE 85 10/17/2008 0000   BUN 32 (H) 11/07/2022 1344   BUN 25 01/30/2020 1525   CREATININE 1.31 11/07/2022 1344   CREATININE 1.00 08/13/2020 1610   CALCIUM 9.4 11/07/2022 1344   PROT 6.2 11/07/2022 1344   ALBUMIN 4.0 11/07/2022 1344   AST 17 11/07/2022 1344  ALT 15 11/07/2022 1344   ALKPHOS 68 11/07/2022 1344   BILITOT 0.6 11/07/2022 1344   GFRNONAA >60 12/26/2021 1052   GFRNONAA 66 08/13/2020 1610   GFRAA 77 08/13/2020 1610    Assessment: 1.  GERD: Moderately controlled on Omeprazole 40  twice daily and Pepcid 20 mg at night with some breakthrough symptoms and nocturnal cough  Plan: 1.  As discussed with Dr. Ardis Hughs previously patient is high risk for an EGD and at this point it would likely not be beneficial unless symptoms worsen.  Patient would like to avoid. 2.  Discussed timing of Omeprazole, recommend taking 40 mg 30 to 60 minutes before breakfast and dinner.  Patient has this medication at home. 3.  Increased Pepcid to 40 mg nightly.  #30 with 11 refills 4.  Reviewed antireflux diet and lifestyle modifications.  Specifically suggested that he try to eat 3 to 4 hours before he lays down to sleep.  This includes snacking.  Also discussed elevating the head of his bed.  Provided him with an antireflux diet handout. 5.  Patient to follow in clinic with [place] as needed.  Note was sent to Dr. Silverio Decamp in lieu of Dr. Ardis Hughs absence.  Ellouise Newer, PA-C [facility] 12/09/2022, 11:01 AM  Cc: Marin Olp, MD

## 2022-12-23 ENCOUNTER — Other Ambulatory Visit: Payer: Self-pay | Admitting: Family Medicine

## 2022-12-26 ENCOUNTER — Other Ambulatory Visit: Payer: Self-pay | Admitting: Family Medicine

## 2023-01-28 ENCOUNTER — Ambulatory Visit: Payer: Medicare HMO

## 2023-01-28 DIAGNOSIS — I495 Sick sinus syndrome: Secondary | ICD-10-CM

## 2023-01-28 LAB — CUP PACEART REMOTE DEVICE CHECK
Battery Remaining Longevity: 6 mo
Battery Remaining Percentage: 7 %
Brady Statistic RV Percent Paced: 75 %
Date Time Interrogation Session: 20240306031100
Implantable Lead Connection Status: 753985
Implantable Lead Implant Date: 19960809
Implantable Lead Location: 753860
Implantable Lead Model: 4285
Implantable Lead Serial Number: 209144
Implantable Pulse Generator Implant Date: 20130614
Lead Channel Impedance Value: 841 Ohm
Lead Channel Pacing Threshold Amplitude: 0.9 V
Lead Channel Pacing Threshold Pulse Width: 0.4 ms
Lead Channel Setting Pacing Amplitude: 1.4 V
Lead Channel Setting Pacing Pulse Width: 0.4 ms
Lead Channel Setting Sensing Sensitivity: 2.5 mV
Pulse Gen Serial Number: 115653
Zone Setting Status: 755011

## 2023-02-18 DIAGNOSIS — L570 Actinic keratosis: Secondary | ICD-10-CM | POA: Diagnosis not present

## 2023-02-18 DIAGNOSIS — D1801 Hemangioma of skin and subcutaneous tissue: Secondary | ICD-10-CM | POA: Diagnosis not present

## 2023-02-18 DIAGNOSIS — L821 Other seborrheic keratosis: Secondary | ICD-10-CM | POA: Diagnosis not present

## 2023-02-18 DIAGNOSIS — Z85828 Personal history of other malignant neoplasm of skin: Secondary | ICD-10-CM | POA: Diagnosis not present

## 2023-03-04 NOTE — Progress Notes (Signed)
Remote pacemaker transmission.   

## 2023-03-12 ENCOUNTER — Encounter: Payer: Self-pay | Admitting: Family

## 2023-03-12 ENCOUNTER — Ambulatory Visit (INDEPENDENT_AMBULATORY_CARE_PROVIDER_SITE_OTHER): Payer: Medicare HMO | Admitting: Family

## 2023-03-12 VITALS — BP 147/81 | HR 71 | Temp 97.7°F | Ht 69.0 in | Wt 151.1 lb

## 2023-03-12 DIAGNOSIS — J301 Allergic rhinitis due to pollen: Secondary | ICD-10-CM | POA: Diagnosis not present

## 2023-03-12 MED ORDER — LORATADINE 10 MG PO TABS: 10.0000 mg | ORAL_TABLET | Freq: Every day | ORAL | 1 refills | Status: DC

## 2023-03-12 NOTE — Patient Instructions (Signed)
It was very nice to see you today!   I have sent over generic Claritin to take daily. This helps to reduce all of your sinus symptoms including throat mucus that could be contributing to your cough. You can take this safely with your other medicines and ok to take through the allergy season which lasts usually until the end of May. I also encourage getting a saline nasal spray to use several times a day to disinfect and flush your sinuses from the pollen. Try to drink up to 8 cups of water daily to thin your throat & chest mucus.  Call back next week if you are not feeling any better.      PLEASE NOTE:  If you had any lab tests please let us know if you have not heard back within a few days. You may see your results on MyChart before we have a chance to review them but we will give you a call once they are reviewed by [place]. If we ordered any referrals today, please let us know if you have not heard from their office within the next week.

## 2023-03-12 NOTE — Progress Notes (Signed)
Patient ID: Eric Lambert, male    DOB: [birth year], 89 y.o.   MRN: [id number]  Chief Complaint  Patient presents with   Cough    Pt c/o wet Cough causing sore throat, slight fever of 99.4, Nasal drip since Sunday. Wife has pneumonia. Has tried     HPI:      URI sx:  sx for 5 days, has had dry cough, but he says it sounds wet and rattling, hast tried Robitussin cough syrup but has not helped much. He reports also having a runny nose, mild scratchy throat, mostly from coughing, and a low grade fever yesterday. Reports feeling more tired, but not fatigued, able to use a push mower. Wife was positive for covid a few weeks ago and then developed PNA, sent to hospital for a few days, back home now, and insisted pt come to get evaluated.      Assessment & Plan:  1. Seasonal allergic rhinitis due to pollen - sending generic Claritin, advised pt take qd for the next week, if helping sx, continue until end of May. Also advised on using saline nasal spray tid & prn. Drink plenty of fluids. Offered prednisone pack, but would rather not have to take at this time. He will call back next week if sx are not improved.  - loratadine (CLARITIN) 10 MG tablet; Take 1 tablet (10 mg total) by mouth daily.  Dispense: 30 tablet; Refill: 1  Subjective:    Outpatient Medications Prior to Visit  Medication Sig Dispense Refill   apixaban (ELIQUIS) 5 MG TABS tablet Take 1 tablet by mouth twice daily 180 tablet 1   Cholecalciferol (VITAMIN D) 125 MCG (5000 UT) CAPS Take 5,000 Units by mouth in the morning.     Cyanocobalamin (VITAMIN B-12 PO) Take 2 capsules by mouth daily.     famotidine (PEPCID) 40 MG tablet Take 1 tablet (40 mg total) by mouth at bedtime. 30 tablet 1   lisinopril (ZESTRIL) 2.5 MG tablet Take 1 tablet by mouth once daily 90 tablet 0   Multiple Vitamins-Minerals (OCUVITE EYE HEALTH FORMULA PO) Take 1 tablet by mouth in the morning and at bedtime.     omeprazole (PRILOSEC) 40 MG capsule Take  1 tablet by mouth twice daily 30-60 minutes before meals 180 capsule 1   verapamil (CALAN-SR) 240 MG CR tablet Take 1 tablet (240 mg total) by mouth daily. 90 tablet 3   No facility-administered medications prior to visit.   Past Medical History:  Diagnosis Date   Allergy    Arthritis    Atrial fibrillation -permanent    BPH (benign prostatic hyperplasia)    Cancer    HX OF SKIN CANCER    CHF (congestive heart failure)    resolved after pacemaker - tachycardia induced   Complete heart block    Dyspnea    mild   ED (erectile dysfunction)    GERD (gastroesophageal reflux disease)    GLUCOSE INTOLERANCE 10/22/2007   no recent issues   Heart murmur    OSA (obstructive sleep apnea)    NO CPAP    Pacemaker BSX    dual   Pneumonia    HX OF SEVERAL TIMES AS A CHILD    PONV (postoperative nausea and vomiting)    at age 77    Presence of permanent cardiac pacemaker    SUBACUTE BACTERIAL ENDOCARDITIS 1970s   Past Surgical History:  Procedure Laterality Date   ESOPHAGOGASTRODUODENOSCOPY (EGD) WITH PROPOFOL N/A 01/07/2022  Procedure: ESOPHAGOGASTRODUODENOSCOPY (EGD) WITH PROPOFOL;  Surgeon: Beverley Fiedler, MD;  Location: [facility];  Service: Gastroenterology;  Laterality: N/A;   IMPACTION REMOVAL  01/07/2022   Procedure: IMPACTION REMOVAL;  Surgeon: Beverley Fiedler, MD;  Location: [facility];  Service: Gastroenterology;;   INGUINAL HERNIA REPAIR Bilateral 04/08/2021   Procedure: LAPAROSCOPIC BILATERAL INGUINAL HERNIA REPAIR WITH MESH;  Surgeon: Kinsinger, De Blanch, MD;  Location: [facility];  Service: General;  Laterality: Bilateral;   INSERT / REPLACE / REMOVE PACEMAKER     PACEMAKER GENERATOR CHANGE N/A 05/07/2012   Procedure: PACEMAKER GENERATOR CHANGE;  Surgeon: Duke Salvia, MD;  Location: [facility] CATH LAB;  Service: Cardiovascular;  Laterality: N/A;   PACEMAKER PLACEMENT     PARTIAL HIP ARTHROPLASTY     2008   TOTAL HIP ARTHROPLASTY Left 01/07/2022   Procedure: TOTAL HIP  ARTHROPLASTY ANTERIOR APPROACH;  Surgeon: Sheral Apley, MD;  Location: [facility];  Service: Orthopedics;  Laterality: Left;   No Known Allergies    Objective:    Physical Exam Vitals and nursing note reviewed.  Constitutional:      General: He is not in acute distress.    Appearance: Normal appearance.  HENT:     Head: Normocephalic.     Right Ear: Tympanic membrane and ear canal normal.     Left Ear: Tympanic membrane and ear canal normal.     Nose:     Right Sinus: No maxillary sinus tenderness or frontal sinus tenderness.     Left Sinus: No maxillary sinus tenderness or frontal sinus tenderness.     Mouth/Throat:     Mouth: Mucous membranes are moist.     Pharynx: Oropharyngeal exudate present. No pharyngeal swelling, posterior oropharyngeal erythema or uvula swelling.     Tonsils: No tonsillar exudate or tonsillar abscesses.  Cardiovascular:     Rate and Rhythm: Normal rate and regular rhythm.  Pulmonary:     Effort: Pulmonary effort is normal.     Breath sounds: Normal breath sounds.  Musculoskeletal:        General: Normal range of motion.     Cervical back: Normal range of motion.  Lymphadenopathy:     Head:     Right side of head: No preauricular or posterior auricular adenopathy.     Left side of head: No preauricular or posterior auricular adenopathy.     Cervical: No cervical adenopathy.  Skin:    General: Skin is warm and dry.  Neurological:     Mental Status: He is alert and oriented to person, place, and time.  Psychiatric:        Mood and Affect: Mood normal.    BP (!) 147/81 (BP Location: Left Arm, Patient Position: Sitting, Cuff Size: Normal)   Pulse 71   Temp 97.7 F (36.5 C) (Temporal)   Ht  (1.753 m)   Wt 151 lb 2 oz (68.5 kg)   SpO2 97%   BMI 22.32 kg/m  Wt Readings from Last 3 Encounters:  03/12/23 151 lb 2 oz (68.5 kg)  12/09/22 157 lb (71.2 kg)  11/07/22 154 lb 3.2 oz (69.9 kg)      Dulce Sellar, NP

## 2023-03-18 DIAGNOSIS — H353132 Nonexudative age-related macular degeneration, bilateral, intermediate dry stage: Secondary | ICD-10-CM | POA: Diagnosis not present

## 2023-03-27 ENCOUNTER — Inpatient Hospital Stay: Payer: Medicare HMO | Attending: Oncology

## 2023-03-27 ENCOUNTER — Inpatient Hospital Stay (HOSPITAL_BASED_OUTPATIENT_CLINIC_OR_DEPARTMENT_OTHER): Payer: Medicare HMO | Admitting: Oncology

## 2023-03-27 VITALS — BP 130/64 | HR 64 | Temp 98.2°F | Resp 18 | Ht 69.0 in | Wt 151.3 lb

## 2023-03-27 DIAGNOSIS — G4730 Sleep apnea, unspecified: Secondary | ICD-10-CM | POA: Insufficient documentation

## 2023-03-27 DIAGNOSIS — J069 Acute upper respiratory infection, unspecified: Secondary | ICD-10-CM | POA: Insufficient documentation

## 2023-03-27 DIAGNOSIS — D759 Disease of blood and blood-forming organs, unspecified: Secondary | ICD-10-CM | POA: Insufficient documentation

## 2023-03-27 DIAGNOSIS — I10 Essential (primary) hypertension: Secondary | ICD-10-CM | POA: Insufficient documentation

## 2023-03-27 DIAGNOSIS — D649 Anemia, unspecified: Secondary | ICD-10-CM | POA: Diagnosis not present

## 2023-03-27 DIAGNOSIS — D696 Thrombocytopenia, unspecified: Secondary | ICD-10-CM | POA: Diagnosis not present

## 2023-03-27 DIAGNOSIS — I4891 Unspecified atrial fibrillation: Secondary | ICD-10-CM | POA: Diagnosis not present

## 2023-03-27 LAB — CBC WITH DIFFERENTIAL (CANCER CENTER ONLY)
Abs Immature Granulocytes: 0.01 10*3/uL (ref 0.00–0.07)
Basophils Absolute: 0 10*3/uL (ref 0.0–0.1)
Basophils Relative: 1 %
Eosinophils Absolute: 0.2 10*3/uL (ref 0.0–0.5)
Eosinophils Relative: 3 %
HCT: 38.7 % — ABNORMAL LOW (ref 39.0–52.0)
Hemoglobin: 12.9 g/dL — ABNORMAL LOW (ref 13.0–17.0)
Immature Granulocytes: 0 %
Lymphocytes Relative: 24 %
Lymphs Abs: 1.1 10*3/uL (ref 0.7–4.0)
MCH: 30.1 pg (ref 26.0–34.0)
MCHC: 33.3 g/dL (ref 30.0–36.0)
MCV: 90.4 fL (ref 80.0–100.0)
Monocytes Absolute: 0.5 10*3/uL (ref 0.1–1.0)
Monocytes Relative: 11 %
Neutro Abs: 3 10*3/uL (ref 1.7–7.7)
Neutrophils Relative %: 61 %
Platelet Count: 180 10*3/uL (ref 150–400)
RBC: 4.28 MIL/uL (ref 4.22–5.81)
RDW: 13.6 % (ref 11.5–15.5)
WBC Count: 4.9 10*3/uL (ref 4.0–10.5)
nRBC: 0 % (ref 0.0–0.2)

## 2023-03-27 NOTE — Progress Notes (Signed)
  [facility] OFFICE PROGRESS NOTE   Diagnosis: Anemia, cytopenia  INTERVAL HISTORY:   Eric Lambert returns as scheduled.  He generally feels well.  He had a recent upper respiratory infection with a cough and low-grade fever.  He reports the symptoms are "98% "improved.  No other complaint.  Objective:  Vital signs in last 24 hours:  Blood pressure 130/64, pulse 64, temperature 98.2 F (36.8 C), resp. rate 18, height 5\' 9"  (1.753 m), weight 151 lb 4.8 oz (68.6 kg), SpO2 99 %.    Lymphatics: No cervical, supraclavicular, or inguinal nodes.  "Shotty "left greater than right axillary nodes Resp: Lungs clear bilaterally with distant breath sounds, no respiratory distress Cardio: Regular rate and rhythm GI: No hepatosplenomegaly Vascular: No leg edema  Lab Results:  Lab Results  Component Value Date   WBC 4.9 03/27/2023   HGB 12.9 (L) 03/27/2023   HCT 38.7 (L) 03/27/2023   MCV 90.4 03/27/2023   PLT 180 03/27/2023   NEUTROABS 3.0 03/27/2023    CMP  Lab Results  Component Value Date   NA 138 11/07/2022   K 4.9 11/07/2022   CL 101 11/07/2022   CO2 31 11/07/2022   GLUCOSE 106 (H) 11/07/2022   BUN 32 (H) 11/07/2022   CREATININE 1.31 11/07/2022   CALCIUM 9.4 11/07/2022   PROT 6.2 11/07/2022   ALBUMIN 4.0 11/07/2022   AST 17 11/07/2022   ALT 15 11/07/2022   ALKPHOS 68 11/07/2022   BILITOT 0.6 11/07/2022   GFRNONAA >60 12/26/2021   GFRAA 77 08/13/2020     Medications: I have reviewed the patient's current medications.   Assessment/Plan:  Anemia-mild normocytic Chronic mild thrombocytopenia-normal 03/27/2023 Left total hip replacement February 2023  A-fib on anticoagulation Hypertension Pacemaker Sleep apnea   Disposition: Mr. Ramakrishnan appears stable.  The hemoglobin is in the low normal range and the platelet count is normal today.  A myeloma panel was negative in August 2023.  He may have early myelodysplasia.  He will return for an office and  lab visit in 1 year.  I am available to see him in the interim if he develops progressive anemia or a new hematologic abnormality.  Thornton Papas, MD  03/27/2023  12:22 PM

## 2023-04-07 ENCOUNTER — Other Ambulatory Visit: Payer: Self-pay | Admitting: Internal Medicine

## 2023-04-07 DIAGNOSIS — I4821 Permanent atrial fibrillation: Secondary | ICD-10-CM

## 2023-04-07 NOTE — Telephone Encounter (Signed)
Eliquis 5mg  refill request received. Patient is 89 years old, weight-68.6kg, Crea-1.31 on 11/07/22, Diagnosis-Afib, and last seen by Dr. Graciela Husbands on 04/16/22 & pending appt on 05/04/23. Dose is appropriate based on dosing criteria. Will send in refill to requested pharmacy.

## 2023-04-13 ENCOUNTER — Encounter: Payer: Medicare HMO | Admitting: Internal Medicine

## 2023-04-14 ENCOUNTER — Telehealth: Payer: Self-pay | Admitting: Physician Assistant

## 2023-04-14 NOTE — Telephone Encounter (Signed)
Patient called stating he believes that the medications he was prescribed are affecting his teeth. States before starting he did not have any cavities, but after he started to have them. Requesting a call back and a detailed message to discuss what could be done to prevent this. Please advise, thank you.

## 2023-04-14 NOTE — Telephone Encounter (Signed)
Called and left patient a detailed vm letting him know that neither Pepcid or Prilosec are known to cause cavities. Patient has been advised to contact dentist for evaluation.

## 2023-04-29 ENCOUNTER — Ambulatory Visit (INDEPENDENT_AMBULATORY_CARE_PROVIDER_SITE_OTHER): Payer: Medicare HMO

## 2023-04-29 DIAGNOSIS — I495 Sick sinus syndrome: Secondary | ICD-10-CM

## 2023-04-29 LAB — CUP PACEART REMOTE DEVICE CHECK
Battery Remaining Longevity: 3 mo
Battery Remaining Percentage: 3 %
Brady Statistic RV Percent Paced: 74 %
Date Time Interrogation Session: 20240605031100
Implantable Lead Connection Status: 753985
Implantable Lead Implant Date: 19960809
Implantable Lead Location: 753860
Implantable Lead Model: 4285
Implantable Lead Serial Number: 209144
Implantable Pulse Generator Implant Date: 20130614
Lead Channel Impedance Value: 842 Ohm
Lead Channel Pacing Threshold Amplitude: 0.8 V
Lead Channel Pacing Threshold Pulse Width: 0.4 ms
Lead Channel Setting Pacing Amplitude: 1.5 V
Lead Channel Setting Pacing Pulse Width: 0.4 ms
Lead Channel Setting Sensing Sensitivity: 2.5 mV
Pulse Gen Serial Number: 115653
Zone Setting Status: 755011

## 2023-04-30 DIAGNOSIS — I4729 Other ventricular tachycardia: Secondary | ICD-10-CM | POA: Insufficient documentation

## 2023-05-04 ENCOUNTER — Encounter: Payer: Self-pay | Admitting: Internal Medicine

## 2023-05-04 ENCOUNTER — Ambulatory Visit: Payer: Medicare HMO | Attending: Internal Medicine | Admitting: Internal Medicine

## 2023-05-04 VITALS — BP 114/62 | HR 71 | Ht 69.0 in | Wt 144.8 lb

## 2023-05-04 DIAGNOSIS — R001 Bradycardia, unspecified: Secondary | ICD-10-CM | POA: Diagnosis not present

## 2023-05-04 DIAGNOSIS — Z950 Presence of cardiac pacemaker: Secondary | ICD-10-CM | POA: Diagnosis not present

## 2023-05-04 DIAGNOSIS — I4821 Permanent atrial fibrillation: Secondary | ICD-10-CM

## 2023-05-04 DIAGNOSIS — I4729 Other ventricular tachycardia: Secondary | ICD-10-CM | POA: Diagnosis not present

## 2023-05-04 LAB — CUP PACEART INCLINIC DEVICE CHECK
Date Time Interrogation Session: 20240610122148
Implantable Lead Connection Status: 753985
Implantable Lead Implant Date: 19960809
Implantable Lead Location: 753860
Implantable Lead Model: 4285
Implantable Lead Serial Number: 209144
Implantable Pulse Generator Implant Date: 20130614
Lead Channel Impedance Value: 858 Ohm
Lead Channel Pacing Threshold Amplitude: 0.9 V
Lead Channel Pacing Threshold Pulse Width: 0.4 ms
Lead Channel Sensing Intrinsic Amplitude: 8.9 mV
Lead Channel Setting Pacing Amplitude: 1.4 V
Lead Channel Setting Pacing Pulse Width: 0.4 ms
Lead Channel Setting Sensing Sensitivity: 2.5 mV
Pulse Gen Serial Number: 115653
Zone Setting Status: 755011

## 2023-05-04 NOTE — Progress Notes (Signed)
Radial I appreciate her force in my hand a lot about the day hold it folks, I have a question in favor Patient Care Team: [name], MD as PCP - General (Family Medicine) Duke Salvia, MD as PCP - Electrophysiology (Cardiology) Duke Salvia, MD as PCP - Cardiology (Cardiology) Pati Gallo, MD as Consulting Physician (Sports Medicine)   HPI  Eric Lambert is a 89 y.o. 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  Longstanding cough in the setting of his ACE inhibitor.    Anticoagulation with apixaban; no bleeding The patient denies chest pain, shortness of breath, nocturnal dyspnea, orthopnea or peripheral edema.  There have been no palpitations or syncope.    He describes spells with a stereotypical prodrome accompanied by warmth and fatigue can occur seated and standing sometimes with lightheadedness  Also has described spells where he gets included in his visual field most recently was binocular  His wife is sick with a pulm mass the cause of which is still being sought-this turned out to be an autoimmune process.  She is now has a low-grade leukemia. He has concerns about the diagnosis and the care , Date Cr K Hgb  1/19 0.9  14.4   8/20 1.26  14.5  3/22 0.97 4.2 14.6  2/23 1.12 4.1 13.9  12/23 1.31 4.9 12.9      DATE TEST EF   4/22 Echo   55-60 % BAE                Last echo was 2008.   Past Medical History:  Diagnosis Date   Allergy    Arthritis    Atrial fibrillation -permanent    BPH (benign prostatic hyperplasia)    Cancer (HCC)    HX OF SKIN CANCER    CHF (congestive heart failure) (HCC)    resolved after pacemaker - tachycardia induced   Complete heart block (HCC)    Dyspnea    mild   ED (erectile dysfunction)    GERD (gastroesophageal reflux disease)     GLUCOSE INTOLERANCE 10/22/2007   no recent issues   Heart murmur    OSA (obstructive sleep apnea)    NO CPAP    Pacemaker BSX    dual   Pneumonia    HX OF SEVERAL TIMES AS A CHILD    PONV (postoperative nausea and vomiting)    at age 88    Presence of permanent cardiac pacemaker    SUBACUTE BACTERIAL ENDOCARDITIS 1970s    Past Surgical History:  Procedure Laterality Date   ESOPHAGOGASTRODUODENOSCOPY (EGD) WITH PROPOFOL N/A 01/07/2022   Procedure: ESOPHAGOGASTRODUODENOSCOPY (EGD) WITH PROPOFOL;  Surgeon: Beverley Fiedler, MD;  Location: [facility];  Service: Gastroenterology;  Laterality: N/A;   IMPACTION REMOVAL  01/07/2022   Procedure: IMPACTION REMOVAL;  Surgeon: Beverley Fiedler, MD;  Location: [facility];  Service: Gastroenterology;;   INGUINAL HERNIA REPAIR Bilateral 04/08/2021   Procedure: LAPAROSCOPIC BILATERAL INGUINAL HERNIA REPAIR WITH MESH;  Surgeon: Kinsinger, De Blanch, MD;  Location: [facility];  Service: General;  Laterality: Bilateral;   INSERT / REPLACE / REMOVE PACEMAKER     PACEMAKER GENERATOR CHANGE N/A 05/07/2012   Procedure: PACEMAKER GENERATOR CHANGE;  Surgeon: Duke Salvia, MD;  Location: [facility] CATH LAB;  Service: Cardiovascular;  Laterality: N/A;   PACEMAKER PLACEMENT     PARTIAL HIP  ARTHROPLASTY     2008   TOTAL HIP ARTHROPLASTY Left 01/07/2022   Procedure: TOTAL HIP ARTHROPLASTY ANTERIOR APPROACH;  Surgeon: Sheral Apley, MD;  Location: [facility];  Service: Orthopedics;  Laterality: Left;    Current Outpatient Medications  Medication Sig Dispense Refill   apixaban (ELIQUIS) 5 MG TABS tablet Take 1 tablet by mouth twice daily 180 tablet 1   Cholecalciferol (VITAMIN D) 125 MCG (5000 UT) CAPS Take 5,000 Units by mouth in the morning.     Cyanocobalamin (VITAMIN B-12 PO) Take 2 capsules by mouth daily.     famotidine (PEPCID) 40 MG tablet Take 1 tablet (40 mg total) by mouth at bedtime. 30 tablet 1   lisinopril (ZESTRIL) 2.5 MG tablet Take 1 tablet by mouth once  daily 90 tablet 0   loratadine (CLARITIN) 10 MG tablet Take 1 tablet (10 mg total) by mouth daily. 30 tablet 1   Multiple Vitamins-Minerals (OCUVITE EYE HEALTH FORMULA PO) Take 1 tablet by mouth in the morning and at bedtime.     omeprazole (PRILOSEC) 40 MG capsule Take 1 tablet by mouth twice daily 30-60 minutes before meals 180 capsule 1   verapamil (CALAN-SR) 240 MG CR tablet Take 1 tablet (240 mg total) by mouth daily. 90 tablet 3   No current facility-administered medications for this visit.    No Known Allergies  Review of Systems negative except from HPI and PMH  Physical Exam BP 114/62   Pulse 71   Ht 5\' 9"  (1.753 m)   Wt 144 lb 12.8 oz (65.7 kg)   SpO2 97%   BMI 21.38 kg/m  Well developed and well nourished in no acute distress HENT normal Neck supple with JVP-flat Clear Device pocket well healed; without hematoma or erythema.  There is no tethering  Regular rate and rhythm, no  gallop No  murmur Abd-soft with active BS No Clubbing cyanosis  edema Skin-warm and dry A & Oriented  Grossly normal sensory and motor function  ECG atrial fib with intrinsic conduction  and intermittent V pacing  Device function isnormal. Programming changes   See Paceart for details      Assessment and  Plan  Atrial fibrillation  Permanent    Pacemaker-[employer]    Bradycardia  Hypertension   Renal function grade 3  VT NS   Spells   Remote endocarditis  Permanent atrial fibrillation.  No intermittent bleeding.  Continue apixaban dosed on weight and creatinine clearance although approaching also weight and a creatinine clearance which might justify down titration  Has a dental procedure with extraction coming up.  Recommendations reviewed regarding anticoagulation.  No need to hold but I told him he can hold it the morning of.  He has a history of endocarditis and he takes antibiotic prophylaxis  Device approaching ERI    Blood pressure is on the low side at  home.  He describes spells suggestive of vasomotor reflexes characterized by fatigue.  No comments on pallor.>>  11 discontinue his lisinopril  Also discussed strategies for trying to help with his wife's health care engagements

## 2023-05-04 NOTE — Patient Instructions (Addendum)
Medication Instructions:  Your physician has recommended you make the following change in your medication:   ** Stop Lisinopril  *If you need a refill on your cardiac medications before your next appointment, please call your pharmacy*   Lab Work: None ordered.  If you have labs (blood work) drawn today and your tests are completely normal, you will receive your results only by: MyChart Message (if you have MyChart) OR A paper copy in the mail If you have any lab test that is abnormal or we need to change your treatment, we will call you to review the results.   Testing/Procedures: None ordered.    Follow-Up: At [facility], you and your health needs are our priority.  As part of our continuing mission to provide you with exceptional heart care, we have created designated Provider Care Teams.  These Care Teams include your primary Cardiologist (physician) and Advanced Practice Providers (APPs -  Physician Assistants and Nurse Practitioners) who all work together to provide you with the care you need, when you need it.  We recommend signing up for the patient portal called "MyChart".  Sign up information is provided on this After Visit Summary.  MyChart is used to connect with patients for Virtual Visits (Telemedicine).  Patients are able to view lab/test results, encounter notes, upcoming appointments, etc.  Non-urgent messages can be sent to your provider as well.   To learn more about what you can do with MyChart, go to [email or web address].    Your next appointment:   To be scheduled

## 2023-05-06 ENCOUNTER — Other Ambulatory Visit: Payer: Self-pay | Admitting: Internal Medicine

## 2023-05-08 DIAGNOSIS — H5310 Unspecified subjective visual disturbances: Secondary | ICD-10-CM | POA: Diagnosis not present

## 2023-05-12 ENCOUNTER — Telehealth: Payer: Self-pay | Admitting: *Deleted

## 2023-05-12 NOTE — Telephone Encounter (Signed)
   Pre-operative Risk Assessment    Patient Name: Eric Lambert  DOB: Apr 08, 1929 MRN: [id number]     Request for Surgical Clearance    Procedure:  Dental Extraction - Amount of Teeth to be Pulled:  with platelet rich-fibrin and bioexclude membrane  1 tooth.  Date of Surgery:  Clearance TBD                                 Surgeon:  Dr. Lutricia Feil Surgeon's Group or Practice Name:  [facility]. Phone number:  [phone] Fax number:  [phone]   Type of Clearance Requested:   - Medical  - Pharmacy:  Hold Apixaban (Eliquis) Doesn't want pt to hold.    Type of Anesthesia:   Mild Intravenous Sedation   Additional requests/questions:  Patient seen Dr. Graciela Husbands on May 04, 2023 and addressed clearance in ov note.   Signed, Emmit Pomfret   05/12/2023, 7:18 AM

## 2023-05-12 NOTE — Telephone Encounter (Signed)
   Patient Name: Eric Lambert  DOB: 11/22/1929 MRN: [id number]  Primary Cardiologist: Sherryl Manges, MD  Chart reviewed as part of pre-operative protocol coverage.   Simple dental extractions (i.e. 1-2 teeth) are considered low risk procedures per guidelines and generally do not require any specific cardiac clearance. It is also generally accepted that for simple extractions and dental cleanings, there is no need to interrupt blood thinner therapy.  Per Dr. Odessa Fleming most recent note from office visit on 05/04/2023, "Has a dental procedure with extraction coming up.  Recommendations reviewed regarding anticoagulation.  No need to hold but I told him he can hold it the morning of.  He has a history of endocarditis and he takes antibiotic prophylaxis."    SBE prophylaxis is required for the patient from a cardiac standpoint.  I will route this recommendation to the requesting party via Epic fax function and remove from pre-op pool.  Please call with questions.  Joylene Grapes, NP 05/12/2023, 7:42 AM

## 2023-05-20 ENCOUNTER — Encounter: Payer: Medicare HMO | Admitting: Family Medicine

## 2023-05-25 NOTE — Progress Notes (Signed)
Remote pacemaker transmission.   

## 2023-06-01 ENCOUNTER — Ambulatory Visit: Payer: Self-pay

## 2023-06-20 ENCOUNTER — Other Ambulatory Visit: Payer: Self-pay | Admitting: Family Medicine

## 2023-07-02 ENCOUNTER — Ambulatory Visit (INDEPENDENT_AMBULATORY_CARE_PROVIDER_SITE_OTHER): Payer: Self-pay

## 2023-07-02 DIAGNOSIS — I4821 Permanent atrial fibrillation: Secondary | ICD-10-CM

## 2023-07-06 ENCOUNTER — Other Ambulatory Visit: Payer: Self-pay | Admitting: Physician Assistant

## 2023-07-29 ENCOUNTER — Ambulatory Visit: Payer: Self-pay

## 2023-08-18 NOTE — Progress Notes (Signed)
Remote pacemaker transmission.   

## 2023-09-01 ENCOUNTER — Ambulatory Visit (INDEPENDENT_AMBULATORY_CARE_PROVIDER_SITE_OTHER): Payer: Medicare HMO

## 2023-09-01 DIAGNOSIS — I4821 Permanent atrial fibrillation: Secondary | ICD-10-CM

## 2023-09-02 LAB — CUP PACEART REMOTE DEVICE CHECK
Battery Remaining Longevity: 3 mo — CL
Battery Remaining Percentage: 0 %
Brady Statistic RV Percent Paced: 74 %
Date Time Interrogation Session: 20241008031200
Implantable Lead Connection Status: 753985
Implantable Lead Implant Date: 19960809
Implantable Lead Location: 753860
Implantable Lead Model: 4285
Implantable Lead Serial Number: 209144
Implantable Pulse Generator Implant Date: 20130614
Lead Channel Impedance Value: 848 Ohm
Lead Channel Pacing Threshold Amplitude: 1.4 V
Lead Channel Pacing Threshold Pulse Width: 0.4 ms
Lead Channel Setting Pacing Amplitude: 1.9 V
Lead Channel Setting Pacing Pulse Width: 0.4 ms
Lead Channel Setting Sensing Sensitivity: 2.5 mV
Pulse Gen Serial Number: 115653
Zone Setting Status: 755011

## 2023-09-07 ENCOUNTER — Telehealth: Payer: Self-pay

## 2023-09-07 NOTE — Telephone Encounter (Signed)
Following alert received from [employer] received for pacemaker reaching ERI 09/04/23. Attempted to contact patient to advise. No answer, LMTCB.

## 2023-09-07 NOTE — Telephone Encounter (Signed)
Patient called to advise need apt with MD to discuss gen change. Patient request a call back around noon during the daytme.

## 2023-09-07 NOTE — Telephone Encounter (Signed)
Pt states he will call the nurse back after 1 pm. He do not answer calls because he get a lot of scam calls.

## 2023-09-08 ENCOUNTER — Ambulatory Visit: Payer: Medicare HMO | Attending: Internal Medicine | Admitting: Internal Medicine

## 2023-09-08 ENCOUNTER — Encounter: Payer: Self-pay | Admitting: Internal Medicine

## 2023-09-08 VITALS — BP 102/58 | HR 60 | Ht 69.0 in | Wt 151.6 lb

## 2023-09-08 DIAGNOSIS — Z01812 Encounter for preprocedural laboratory examination: Secondary | ICD-10-CM

## 2023-09-08 DIAGNOSIS — R001 Bradycardia, unspecified: Secondary | ICD-10-CM | POA: Diagnosis not present

## 2023-09-08 DIAGNOSIS — I4821 Permanent atrial fibrillation: Secondary | ICD-10-CM | POA: Diagnosis not present

## 2023-09-08 DIAGNOSIS — I4729 Other ventricular tachycardia: Secondary | ICD-10-CM

## 2023-09-08 DIAGNOSIS — Z950 Presence of cardiac pacemaker: Secondary | ICD-10-CM | POA: Diagnosis not present

## 2023-09-08 NOTE — Patient Instructions (Signed)
Medication Instructions:  Your physician recommends that you continue on your current medications as directed. Please refer to the Current Medication list given to you today.  *If you need a refill on your cardiac medications before your next appointment, please call your pharmacy*   Lab Work: CBC and BMET If you have labs (blood work) drawn today and your tests are completely normal, you will receive your results only by: MyChart Message (if you have MyChart) OR A paper copy in the mail If you have any lab test that is abnormal or we need to change your treatment, we will call you to review the results.   Testing/Procedures: None ordered.    Follow-Up: At [facility], you and your health needs are our priority.  As part of our continuing mission to provide you with exceptional heart care, we have created designated Provider Care Teams.  These Care Teams include your primary Cardiologist (physician) and Advanced Practice Providers (APPs -  Physician Assistants and Nurse Practitioners) who all work together to provide you with the care you need, when you need it.  We recommend signing up for the patient portal called "MyChart".  Sign up information is provided on this After Visit Summary.  MyChart is used to connect with patients for Virtual Visits (Telemedicine).  Patients are able to view lab/test results, encounter notes, upcoming appointments, etc.  Non-urgent messages can be sent to your provider as well.   To learn more about what you can do with MyChart, go to [email or web address].    Your next appointment:   To be scheduled

## 2023-09-08 NOTE — Progress Notes (Signed)
HPI  Eric Lambert is a 89 y.o. 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  Device ahs  reached ERI  Longstanding cough in the setting of his ACE inhibitor.    Anticoagulation with apixaban; no bleeding The patient denies chest pain,, nocturnal dyspnea, orthopnea or peripheral edema.  There have been no palpitations, lightheadedness or syncope.  Complains of DOE -- but able to mow the yard pushing  On apixaban  no bleeding .      His wife is sick with a pulm mass the cause of which is still being sought-this turned out to be an autoimmune process.  She is now has a low-grade leukemia. He has concerns about the diagnosis and the care , Date Cr K Hgb  1/19 0.9  14.4   8/20 1.26  14.5  3/22 0.97 4.2 14.6  2/23 1.12 4.1 13.9  12/23 1.31 4.9 12.9      DATE TEST EF   4/22 Echo   55-60 % BAE                Last echo was 2008.   Past Medical History:  Diagnosis Date   Allergy    Arthritis    Atrial fibrillation -permanent    BPH (benign prostatic hyperplasia)    Cancer (HCC)    HX OF SKIN CANCER    CHF (congestive heart failure) (HCC)    resolved after pacemaker - tachycardia induced   Complete heart block (HCC)    Dyspnea    mild   ED (erectile dysfunction)    GERD (gastroesophageal reflux disease)    GLUCOSE INTOLERANCE 10/22/2007   no recent issues   Heart murmur    OSA (obstructive sleep apnea)    NO CPAP    Pacemaker BSX    dual   Pneumonia    HX OF SEVERAL TIMES AS A CHILD    PONV (postoperative nausea and vomiting)    at age 58    Presence of permanent cardiac pacemaker    SUBACUTE BACTERIAL ENDOCARDITIS 1970s    Past Surgical History:  Procedure Laterality Date   ESOPHAGOGASTRODUODENOSCOPY (EGD) WITH PROPOFOL N/A 01/07/2022   Procedure:  ESOPHAGOGASTRODUODENOSCOPY (EGD) WITH PROPOFOL;  Surgeon: Beverley Fiedler, MD;  Location: [facility];  Service: Gastroenterology;  Laterality: N/A;   IMPACTION REMOVAL  01/07/2022   Procedure: IMPACTION REMOVAL;  Surgeon: Beverley Fiedler, MD;  [facility]: [facility];  Service: Gastroenterology;;   INGUINAL HERNIA REPAIR Bilateral 04/08/2021   Procedure: LAPAROSCOPIC BILATERAL INGUINAL HERNIA REPAIR WITH MESH;  Surgeon: Kinsinger, De Blanch, MD;  Location: [facility];  Service: General;  Laterality: Bilateral;   INSERT / REPLACE / REMOVE PACEMAKER     PACEMAKER GENERATOR CHANGE N/A 05/07/2012   Procedure: PACEMAKER GENERATOR CHANGE;  Surgeon: Duke Salvia, MD;  Location: [facility] CATH LAB;  Service: Cardiovascular;  Laterality: N/A;   PACEMAKER PLACEMENT     PARTIAL HIP ARTHROPLASTY     2008   TOTAL HIP ARTHROPLASTY Left 01/07/2022   Procedure: TOTAL HIP ARTHROPLASTY ANTERIOR APPROACH;  Surgeon: Sheral Apley, MD;  Location: [facility];  Service: Orthopedics;  Laterality: Left;    Current Outpatient Medications  Medication Sig Dispense Refill   apixaban (ELIQUIS) 5 MG TABS tablet Take 1 tablet by mouth twice daily 180 tablet 1   Cholecalciferol (VITAMIN D) 125 MCG (  5000 UT) CAPS Take 5,000 Units by mouth in the morning.     Cyanocobalamin (VITAMIN B-12 PO) Take 2 capsules by mouth daily.     famotidine (PEPCID) 40 MG tablet Take 1 tablet (40 mg total) by mouth at bedtime. 30 tablet 1   loratadine (CLARITIN) 10 MG tablet Take 1 tablet (10 mg total) by mouth daily. 30 tablet 1   Multiple Vitamins-Minerals (OCUVITE EYE HEALTH FORMULA PO) Take 1 tablet by mouth in the morning and at bedtime.     omeprazole (PRILOSEC) 40 MG capsule TAKE 1 CAPSULE BY MOUTH TWICE DAILY 30-60 MINUTES BEFORE MEALS (BREAKFAST AND DINNER) 180 capsule 0   verapamil (CALAN-SR) 240 MG CR tablet Take 1 tablet by mouth once daily 90 tablet 3   No current facility-administered medications for this visit.    No Known  Allergies  Review of Systems negative except from HPI and PMH  Physical Exam BP (!) 102/58   Pulse 60   Ht 5\' 9"  (1.753 m)   Wt 151 lb 9.6 oz (68.8 kg)   SpO2 98%   BMI 22.39 kg/m  Well developed and well nourished in no acute distress HENT normal Neck supple with JVP-flat Clear Device pocket well healed; without hematoma or erythema.  There is no tethering  Regular rate and rhythm, n gallop No murmur Abd-soft with active BS No Clubbing cyanosis  edema Skin-warm and dry A & Oriented  Grossly normal sensory and motor function  ECG atrial fib and V pacing  Device function is abnormal.  At [facility] Programming changes none  See Paceart for details      Assessment and  Plan  Atrial fibrillation  Permanent    Pacemaker-[employer]    Bradycardia  Hypertension   Renal function grade 3   Remote endocarditis   We have reviewed the benefits and risks of generator replacement.  These include but are not limited to lead fracture and infection.  The patient understands, agrees and is willing to proceed.     BP well controlled continue verapamil  Some DOE >> but euvolemic  at age will not press more meds ( able to mow his yard pushing the mower)

## 2023-09-08 NOTE — H&P (View-Only) (Signed)
HPI  Eric Lambert is a 89 y.o. 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  Device ahs  reached ERI  Longstanding cough in the setting of his ACE inhibitor.    Anticoagulation with apixaban; no bleeding The patient denies chest pain,, nocturnal dyspnea, orthopnea or peripheral edema.  There have been no palpitations, lightheadedness or syncope.  Complains of DOE -- but able to mow the yard pushing  On apixaban  no bleeding .      His wife is sick with a pulm mass the cause of which is still being sought-this turned out to be an autoimmune process.  She is now has a low-grade leukemia. He has concerns about the diagnosis and the care , Date Cr K Hgb  1/19 0.9  14.4   8/20 1.26  14.5  3/22 0.97 4.2 14.6  2/23 1.12 4.1 13.9  12/23 1.31 4.9 12.9      DATE TEST EF   4/22 Echo   55-60 % BAE                Last echo was 2008.   Past Medical History:  Diagnosis Date   Allergy    Arthritis    Atrial fibrillation -permanent    BPH (benign prostatic hyperplasia)    Cancer (HCC)    HX OF SKIN CANCER    CHF (congestive heart failure) (HCC)    resolved after pacemaker - tachycardia induced   Complete heart block (HCC)    Dyspnea    mild   ED (erectile dysfunction)    GERD (gastroesophageal reflux disease)    GLUCOSE INTOLERANCE 10/22/2007   no recent issues   Heart murmur    OSA (obstructive sleep apnea)    NO CPAP    Pacemaker BSX    dual   Pneumonia    HX OF SEVERAL TIMES AS A CHILD    PONV (postoperative nausea and vomiting)    at age 58    Presence of permanent cardiac pacemaker    SUBACUTE BACTERIAL ENDOCARDITIS 1970s    Past Surgical History:  Procedure Laterality Date   ESOPHAGOGASTRODUODENOSCOPY (EGD) WITH PROPOFOL N/A 01/07/2022   Procedure:  ESOPHAGOGASTRODUODENOSCOPY (EGD) WITH PROPOFOL;  Surgeon: Beverley Fiedler, MD;  [facility]: [facility];  Service: Gastroenterology;  Laterality: N/A;   IMPACTION REMOVAL  01/07/2022   Procedure: IMPACTION REMOVAL;  Surgeon: Beverley Fiedler, MD;  Location: [facility];  Service: Gastroenterology;;   INGUINAL HERNIA REPAIR Bilateral 04/08/2021   Procedure: LAPAROSCOPIC BILATERAL INGUINAL HERNIA REPAIR WITH MESH;  Surgeon: Kinsinger, De Blanch, MD;  Location: [facility];  Service: General;  Laterality: Bilateral;   INSERT / REPLACE / REMOVE PACEMAKER     PACEMAKER GENERATOR CHANGE N/A 05/07/2012   Procedure: PACEMAKER GENERATOR CHANGE;  Surgeon: Duke Salvia, MD;  Location: [facility] CATH LAB;  Service: Cardiovascular;  Laterality: N/A;   PACEMAKER PLACEMENT     PARTIAL HIP ARTHROPLASTY     2008   TOTAL HIP ARTHROPLASTY Left 01/07/2022   Procedure: TOTAL HIP ARTHROPLASTY ANTERIOR APPROACH;  Surgeon: Sheral Apley, MD;  Location: [facility];  Service: Orthopedics;  Laterality: Left;    Current Outpatient Medications  Medication Sig Dispense Refill   apixaban (ELIQUIS) 5 MG TABS tablet Take 1 tablet by mouth twice daily 180 tablet 1   Cholecalciferol (VITAMIN D) 125 MCG (  5000 UT) CAPS Take 5,000 Units by mouth in the morning.     Cyanocobalamin (VITAMIN B-12 PO) Take 2 capsules by mouth daily.     famotidine (PEPCID) 40 MG tablet Take 1 tablet (40 mg total) by mouth at bedtime. 30 tablet 1   loratadine (CLARITIN) 10 MG tablet Take 1 tablet (10 mg total) by mouth daily. 30 tablet 1   Multiple Vitamins-Minerals (OCUVITE EYE HEALTH FORMULA PO) Take 1 tablet by mouth in the morning and at bedtime.     omeprazole (PRILOSEC) 40 MG capsule TAKE 1 CAPSULE BY MOUTH TWICE DAILY 30-60 MINUTES BEFORE MEALS (BREAKFAST AND DINNER) 180 capsule 0   verapamil (CALAN-SR) 240 MG CR tablet Take 1 tablet by mouth once daily 90 tablet 3   No current facility-administered medications for this visit.    No Known  Allergies  Review of Systems negative except from HPI and PMH  Physical Exam BP (!) 102/58   Pulse 60   Ht 5\' 9"  (1.753 m)   Wt 151 lb 9.6 oz (68.8 kg)   SpO2 98%   BMI 22.39 kg/m  Well developed and well nourished in no acute distress HENT normal Neck supple with JVP-flat Clear Device pocket well healed; without hematoma or erythema.  There is no tethering  Regular rate and rhythm, n gallop No murmur Abd-soft with active BS No Clubbing cyanosis  edema Skin-warm and dry A & Oriented  Grossly normal sensory and motor function  ECG atrial fib and V pacing  Device function is abnormal.  At [facility] Programming changes none  See Paceart for details      Assessment and  Plan  Atrial fibrillation  Permanent    Pacemaker-[employer]    Bradycardia  Hypertension   Renal function grade 3   Remote endocarditis   We have reviewed the benefits and risks of generator replacement.  These include but are not limited to lead fracture and infection.  The patient understands, agrees and is willing to proceed.     BP well controlled continue verapamil  Some DOE >> but euvolemic  at age will not press more meds ( able to mow his yard pushing the mower)

## 2023-09-09 LAB — BASIC METABOLIC PANEL
BUN/Creatinine Ratio: 27 — ABNORMAL HIGH (ref 10–24)
BUN: 35 mg/dL (ref 10–36)
CO2: 23 mmol/L (ref 20–29)
Calcium: 9.8 mg/dL (ref 8.6–10.2)
Chloride: 102 mmol/L (ref 96–106)
Creatinine, Ser: 1.31 mg/dL — ABNORMAL HIGH (ref 0.76–1.27)
Glucose: 74 mg/dL (ref 70–99)
Potassium: 4.5 mmol/L (ref 3.5–5.2)
Sodium: 143 mmol/L (ref 134–144)
eGFR: 51 mL/min/{1.73_m2} — ABNORMAL LOW (ref >59)

## 2023-09-09 LAB — CBC
Hematocrit: 42.2 % (ref 37.5–51.0)
Hemoglobin: 13.9 g/dL (ref 13.0–17.7)
MCH: 30.7 pg (ref 26.6–33.0)
MCHC: 32.9 g/dL (ref 31.5–35.7)
MCV: 93 fL (ref 79–97)
Platelets: 142 10*3/uL — ABNORMAL LOW (ref 150–450)
RBC: 4.53 x10E6/uL (ref 4.14–5.80)
RDW: 12.7 % (ref 11.6–15.4)
WBC: 6.4 10*3/uL (ref 3.4–10.8)

## 2023-09-15 NOTE — Pre-Procedure Instructions (Signed)
Attempted to call patient regarding procedure instructions.  Left voicemail on the following items: Arrival time 0600 Nothing to eat or drink after midnight No meds AM of procedure Responsible person to drive you home and stay with you for 24 hrs Wash with special soap night before and morning of procedure

## 2023-09-16 ENCOUNTER — Ambulatory Visit (HOSPITAL_COMMUNITY)
Admission: RE | Admit: 2023-09-16 | Discharge: 2023-09-16 | Disposition: A | Discharge status: Home / Self Care | Payer: Medicare HMO | Attending: Internal Medicine | Admitting: Internal Medicine

## 2023-09-16 ENCOUNTER — Other Ambulatory Visit: Payer: Self-pay

## 2023-09-16 ENCOUNTER — Encounter (HOSPITAL_COMMUNITY)
Admission: RE | Disposition: A | Discharge status: Home / Self Care | Payer: Self-pay | Source: Home / Self Care | Attending: Internal Medicine

## 2023-09-16 DIAGNOSIS — Z79899 Other long term (current) drug therapy: Secondary | ICD-10-CM | POA: Insufficient documentation

## 2023-09-16 DIAGNOSIS — Z45010 Encounter for checking and testing of cardiac pacemaker pulse generator [battery]: Secondary | ICD-10-CM | POA: Diagnosis not present

## 2023-09-16 DIAGNOSIS — N2889 Other specified disorders of kidney and ureter: Secondary | ICD-10-CM | POA: Diagnosis not present

## 2023-09-16 DIAGNOSIS — Z950 Presence of cardiac pacemaker: Secondary | ICD-10-CM

## 2023-09-16 DIAGNOSIS — R001 Bradycardia, unspecified: Secondary | ICD-10-CM | POA: Diagnosis not present

## 2023-09-16 DIAGNOSIS — R0609 Other forms of dyspnea: Secondary | ICD-10-CM | POA: Diagnosis not present

## 2023-09-16 DIAGNOSIS — Z7901 Long term (current) use of anticoagulants: Secondary | ICD-10-CM | POA: Diagnosis not present

## 2023-09-16 DIAGNOSIS — I4821 Permanent atrial fibrillation: Secondary | ICD-10-CM | POA: Insufficient documentation

## 2023-09-16 DIAGNOSIS — I10 Essential (primary) hypertension: Secondary | ICD-10-CM | POA: Insufficient documentation

## 2023-09-16 DIAGNOSIS — R059 Cough, unspecified: Secondary | ICD-10-CM | POA: Insufficient documentation

## 2023-09-16 HISTORY — PX: PPM GENERATOR CHANGEOUT: EP1233

## 2023-09-16 SURGERY — PPM GENERATOR CHANGEOUT
Anesthesia: LOCAL

## 2023-09-16 MED ORDER — SODIUM CHLORIDE 0.9 % IV SOLN: INTRAVENOUS | Status: AC

## 2023-09-16 MED ORDER — SODIUM CHLORIDE 0.9 % IV SOLN
INTRAVENOUS | Status: AC
  Filled 2023-09-16: qty 2

## 2023-09-16 MED ORDER — CEFAZOLIN SODIUM-DEXTROSE 2-4 GM/100ML-% IV SOLN: 2.0000 g | INTRAVENOUS | Status: AC

## 2023-09-16 MED ORDER — ACETAMINOPHEN 325 MG PO TABS: 325.0000 mg | ORAL_TABLET | ORAL | Status: DC | PRN

## 2023-09-16 MED ORDER — SODIUM CHLORIDE 0.9 % IV SOLN
80.0000 mg | INTRAVENOUS | Status: AC
  Administered 2023-09-16: 80 mg

## 2023-09-16 MED ORDER — CHLORHEXIDINE GLUCONATE 4 % EX SOLN: 4.0000 | Freq: Once | CUTANEOUS | Status: DC

## 2023-09-16 MED ORDER — CEFAZOLIN SODIUM-DEXTROSE 2-4 GM/100ML-% IV SOLN
INTRAVENOUS | Status: AC
  Administered 2023-09-16: 2 g via INTRAVENOUS
  Filled 2023-09-16: qty 100

## 2023-09-16 MED ORDER — LIDOCAINE HCL 1 % IJ SOLN
INTRAMUSCULAR | Status: AC
  Filled 2023-09-16: qty 60

## 2023-09-16 MED ORDER — SODIUM CHLORIDE 0.9 % IV SOLN: INTRAVENOUS | Status: DC

## 2023-09-16 MED ORDER — LIDOCAINE HCL (PF) 1 % IJ SOLN
INTRAMUSCULAR | Status: DC | PRN
  Administered 2023-09-16: 60 mL

## 2023-09-16 SURGICAL SUPPLY — 8 items
CABLE SURGICAL S-101-97-12 (CABLE) ×1 IMPLANT
DEVICE DISSECT PLASMABLAD 3.0S (MISCELLANEOUS) IMPLANT
PACEMAKER ACCOLADE SR (Pacemaker) IMPLANT
PAD DEFIB RADIO PHYSIO CONN (PAD) ×1 IMPLANT
PLASMABLADE 3.0S (MISCELLANEOUS) ×1
POUCH AIGIS-R ANTIBACT PPM (Mesh General) ×1 IMPLANT
POUCH AIGIS-R ANTIBACT PPM MED (Mesh General) IMPLANT
TRAY PACEMAKER INSERTION (PACKS) ×1 IMPLANT

## 2023-09-16 NOTE — Interval H&P Note (Signed)
History and Physical Interval Note:  09/16/2023 7:32 AM  Eric Lambert  has presented today for surgery, with the diagnosis of ERI.  The various methods of treatment have been discussed with the patient and family. After consideration of risks, benefits and other options for treatment, the patient has consented to  Procedure(s): PPM GENERATOR CHANGEOUT (N/A) as a surgical intervention.  The patient's history has been reviewed, patient examined, no change in status, stable for surgery.  I have reviewed the patient's chart and labs.  Questions were answered to the patient's satisfaction.     Sherryl Manges  We have reviewed the benefits and risks of generator replacement.  These include but are not limited to lead fracture and infection.  The patient understands, agrees and is willing to proceed.

## 2023-09-16 NOTE — Discharge Instructions (Signed)

## 2023-09-17 ENCOUNTER — Encounter (HOSPITAL_COMMUNITY): Payer: Self-pay | Admitting: Internal Medicine

## 2023-09-18 NOTE — Addendum Note (Signed)
Addended by: Geralyn Flash D on: 09/18/2023 09:30 AM   Modules accepted: Level of Service

## 2023-09-18 NOTE — Progress Notes (Signed)
Remote pacemaker transmission.   

## 2023-10-05 ENCOUNTER — Other Ambulatory Visit: Payer: Self-pay | Admitting: Nurse Practitioner

## 2023-10-05 ENCOUNTER — Encounter (HOSPITAL_COMMUNITY): Payer: Self-pay

## 2023-10-05 ENCOUNTER — Emergency Department (HOSPITAL_COMMUNITY): Payer: Medicare HMO

## 2023-10-05 ENCOUNTER — Other Ambulatory Visit: Payer: Self-pay

## 2023-10-05 ENCOUNTER — Inpatient Hospital Stay (HOSPITAL_COMMUNITY)
Admission: EM | Admit: 2023-10-05 | Discharge: 2023-10-08 | DRG: 291 | Disposition: A | Discharge status: Home / Self Care | Payer: Medicare HMO | Attending: Internal Medicine | Admitting: Internal Medicine

## 2023-10-05 DIAGNOSIS — I442 Atrioventricular block, complete: Secondary | ICD-10-CM | POA: Diagnosis not present

## 2023-10-05 DIAGNOSIS — Z833 Family history of diabetes mellitus: Secondary | ICD-10-CM | POA: Diagnosis not present

## 2023-10-05 DIAGNOSIS — K219 Gastro-esophageal reflux disease without esophagitis: Secondary | ICD-10-CM | POA: Diagnosis not present

## 2023-10-05 DIAGNOSIS — Z79899 Other long term (current) drug therapy: Secondary | ICD-10-CM

## 2023-10-05 DIAGNOSIS — Z8042 Family history of malignant neoplasm of prostate: Secondary | ICD-10-CM

## 2023-10-05 DIAGNOSIS — R531 Weakness: Secondary | ICD-10-CM | POA: Diagnosis not present

## 2023-10-05 DIAGNOSIS — J90 Pleural effusion, not elsewhere classified: Secondary | ICD-10-CM | POA: Insufficient documentation

## 2023-10-05 DIAGNOSIS — Z808 Family history of malignant neoplasm of other organs or systems: Secondary | ICD-10-CM

## 2023-10-05 DIAGNOSIS — N1832 Chronic kidney disease, stage 3b: Secondary | ICD-10-CM | POA: Diagnosis not present

## 2023-10-05 DIAGNOSIS — I341 Nonrheumatic mitral (valve) prolapse: Secondary | ICD-10-CM | POA: Diagnosis present

## 2023-10-05 DIAGNOSIS — R0609 Other forms of dyspnea: Principal | ICD-10-CM

## 2023-10-05 DIAGNOSIS — R0902 Hypoxemia: Secondary | ICD-10-CM | POA: Diagnosis present

## 2023-10-05 DIAGNOSIS — N400 Enlarged prostate without lower urinary tract symptoms: Secondary | ICD-10-CM | POA: Diagnosis not present

## 2023-10-05 DIAGNOSIS — I5033 Acute on chronic diastolic (congestive) heart failure: Secondary | ICD-10-CM | POA: Insufficient documentation

## 2023-10-05 DIAGNOSIS — R410 Disorientation, unspecified: Secondary | ICD-10-CM | POA: Diagnosis not present

## 2023-10-05 DIAGNOSIS — Z7901 Long term (current) use of anticoagulants: Secondary | ICD-10-CM | POA: Diagnosis not present

## 2023-10-05 DIAGNOSIS — F05 Delirium due to known physiological condition: Secondary | ICD-10-CM | POA: Diagnosis not present

## 2023-10-05 DIAGNOSIS — I495 Sick sinus syndrome: Secondary | ICD-10-CM | POA: Diagnosis not present

## 2023-10-05 DIAGNOSIS — I10 Essential (primary) hypertension: Secondary | ICD-10-CM | POA: Diagnosis present

## 2023-10-05 DIAGNOSIS — Z1152 Encounter for screening for COVID-19: Secondary | ICD-10-CM | POA: Diagnosis not present

## 2023-10-05 DIAGNOSIS — E7439 Other disorders of intestinal carbohydrate absorption: Secondary | ICD-10-CM | POA: Diagnosis present

## 2023-10-05 DIAGNOSIS — R4182 Altered mental status, unspecified: Secondary | ICD-10-CM | POA: Diagnosis not present

## 2023-10-05 DIAGNOSIS — G4733 Obstructive sleep apnea (adult) (pediatric): Secondary | ICD-10-CM | POA: Diagnosis not present

## 2023-10-05 DIAGNOSIS — R0989 Other specified symptoms and signs involving the circulatory and respiratory systems: Secondary | ICD-10-CM | POA: Diagnosis not present

## 2023-10-05 DIAGNOSIS — R0602 Shortness of breath: Secondary | ICD-10-CM | POA: Diagnosis not present

## 2023-10-05 DIAGNOSIS — J9811 Atelectasis: Secondary | ICD-10-CM | POA: Diagnosis not present

## 2023-10-05 DIAGNOSIS — R918 Other nonspecific abnormal finding of lung field: Secondary | ICD-10-CM | POA: Diagnosis not present

## 2023-10-05 DIAGNOSIS — I130 Hypertensive heart and chronic kidney disease with heart failure and stage 1 through stage 4 chronic kidney disease, or unspecified chronic kidney disease: Secondary | ICD-10-CM | POA: Diagnosis not present

## 2023-10-05 DIAGNOSIS — Z950 Presence of cardiac pacemaker: Secondary | ICD-10-CM

## 2023-10-05 DIAGNOSIS — I5032 Chronic diastolic (congestive) heart failure: Secondary | ICD-10-CM | POA: Insufficient documentation

## 2023-10-05 DIAGNOSIS — E876 Hypokalemia: Secondary | ICD-10-CM | POA: Diagnosis not present

## 2023-10-05 DIAGNOSIS — I5031 Acute diastolic (congestive) heart failure: Secondary | ICD-10-CM | POA: Diagnosis not present

## 2023-10-05 DIAGNOSIS — Z96642 Presence of left artificial hip joint: Secondary | ICD-10-CM | POA: Diagnosis not present

## 2023-10-05 DIAGNOSIS — R54 Age-related physical debility: Secondary | ICD-10-CM | POA: Diagnosis not present

## 2023-10-05 DIAGNOSIS — Z85828 Personal history of other malignant neoplasm of skin: Secondary | ICD-10-CM | POA: Diagnosis not present

## 2023-10-05 DIAGNOSIS — I4821 Permanent atrial fibrillation: Secondary | ICD-10-CM | POA: Diagnosis not present

## 2023-10-05 DIAGNOSIS — I509 Heart failure, unspecified: Secondary | ICD-10-CM | POA: Diagnosis not present

## 2023-10-05 DIAGNOSIS — I700 Atherosclerosis of aorta: Secondary | ICD-10-CM | POA: Diagnosis not present

## 2023-10-05 DIAGNOSIS — N1831 Chronic kidney disease, stage 3a: Secondary | ICD-10-CM

## 2023-10-05 DIAGNOSIS — I5030 Unspecified diastolic (congestive) heart failure: Secondary | ICD-10-CM

## 2023-10-05 LAB — CBC
HCT: 40.7 % (ref 39.0–52.0)
Hemoglobin: 13.4 g/dL (ref 13.0–17.0)
MCH: 29.6 pg (ref 26.0–34.0)
MCHC: 32.9 g/dL (ref 30.0–36.0)
MCV: 89.8 fL (ref 80.0–100.0)
Platelets: 130 10*3/uL — ABNORMAL LOW (ref 150–400)
RBC: 4.53 MIL/uL (ref 4.22–5.81)
RDW: 14.7 % (ref 11.5–15.5)
WBC: 8 10*3/uL (ref 4.0–10.5)
nRBC: 0 % (ref 0.0–0.2)

## 2023-10-05 LAB — URINALYSIS, ROUTINE W REFLEX MICROSCOPIC
Bacteria, UA: NONE SEEN
Bilirubin Urine: NEGATIVE
Glucose, UA: NEGATIVE mg/dL
Hgb urine dipstick: NEGATIVE
Ketones, ur: 5 mg/dL — AB
Leukocytes,Ua: NEGATIVE
Nitrite: NEGATIVE
Protein, ur: 30 mg/dL — AB
Specific Gravity, Urine: >1.046 — ABNORMAL HIGH (ref 1.005–1.030)
pH: 5 (ref 5.0–8.0)

## 2023-10-05 LAB — BASIC METABOLIC PANEL
Anion gap: 8 (ref 5–15)
BUN: 39 mg/dL — ABNORMAL HIGH (ref 8–23)
CO2: 22 mmol/L (ref 22–32)
Calcium: 9.1 mg/dL (ref 8.9–10.3)
Chloride: 106 mmol/L (ref 98–111)
Creatinine, Ser: 1.31 mg/dL — ABNORMAL HIGH (ref 0.61–1.24)
GFR, Estimated: 51 mL/min — ABNORMAL LOW (ref >60)
Glucose, Bld: 120 mg/dL — ABNORMAL HIGH (ref 70–99)
Potassium: 4 mmol/L (ref 3.5–5.1)
Sodium: 136 mmol/L (ref 135–145)

## 2023-10-05 LAB — D-DIMER, QUANTITATIVE: D-Dimer, Quant: 0.86 ug{FEU}/mL — ABNORMAL HIGH (ref 0.00–0.50)

## 2023-10-05 LAB — TROPONIN I (HIGH SENSITIVITY)
Troponin I (High Sensitivity): 33 ng/L — ABNORMAL HIGH (ref <18)
Troponin I (High Sensitivity): 33 ng/L — ABNORMAL HIGH (ref <18)

## 2023-10-05 LAB — BRAIN NATRIURETIC PEPTIDE: B Natriuretic Peptide: 229.2 pg/mL — ABNORMAL HIGH (ref 0.0–100.0)

## 2023-10-05 MED ORDER — IOPAMIDOL (ISOVUE-370) INJECTION 76%
50.0000 mL | Freq: Once | INTRAVENOUS | Status: AC | PRN
  Administered 2023-10-05: 50 mL via INTRAVENOUS

## 2023-10-05 MED ORDER — VITAMIN B-12 1000 MCG PO TABS
1000.0000 ug | ORAL_TABLET | Freq: Every day | ORAL | Status: DC
  Administered 2023-10-05 – 2023-10-08 (×4): 1000 ug via ORAL
  Filled 2023-10-05 (×4): qty 1

## 2023-10-05 MED ORDER — SODIUM CHLORIDE 0.9% FLUSH
3.0000 mL | Freq: Two times a day (BID) | INTRAVENOUS | Status: DC
  Administered 2023-10-05 – 2023-10-06 (×2): 3 mL via INTRAVENOUS

## 2023-10-05 MED ORDER — TRAZODONE HCL 50 MG PO TABS
25.0000 mg | ORAL_TABLET | Freq: Every evening | ORAL | Status: DC | PRN
Start: 2023-10-05 — End: 2023-10-07
  Administered 2023-10-05: 25 mg via ORAL
  Filled 2023-10-05: qty 1

## 2023-10-05 MED ORDER — ACETAMINOPHEN 325 MG PO TABS: 650.0000 mg | ORAL_TABLET | Freq: Four times a day (QID) | ORAL | Status: DC | PRN

## 2023-10-05 MED ORDER — FUROSEMIDE 10 MG/ML IJ SOLN
40.0000 mg | Freq: Three times a day (TID) | INTRAMUSCULAR | Status: AC
  Administered 2023-10-05 – 2023-10-06 (×3): 40 mg via INTRAVENOUS
  Filled 2023-10-05 (×3): qty 4

## 2023-10-05 MED ORDER — SODIUM CHLORIDE 0.9% FLUSH: 3.0000 mL | INTRAVENOUS | Status: DC | PRN

## 2023-10-05 MED ORDER — SENNA 8.6 MG PO TABS
1.0000 | ORAL_TABLET | Freq: Two times a day (BID) | ORAL | Status: DC
  Administered 2023-10-06: 8.6 mg via ORAL
  Filled 2023-10-05 (×4): qty 1

## 2023-10-05 MED ORDER — HEPARIN SODIUM (PORCINE) 5000 UNIT/ML IJ SOLN
5000.0000 [IU] | Freq: Three times a day (TID) | INTRAMUSCULAR | Status: DC
  Administered 2023-10-05 – 2023-10-06 (×3): 5000 [IU] via SUBCUTANEOUS
  Filled 2023-10-05 (×3): qty 1

## 2023-10-05 MED ORDER — VERAPAMIL HCL ER 240 MG PO TBCR
240.0000 mg | EXTENDED_RELEASE_TABLET | Freq: Every day | ORAL | Status: DC
  Administered 2023-10-06 – 2023-10-08 (×3): 240 mg via ORAL
  Filled 2023-10-05 (×3): qty 1

## 2023-10-05 MED ORDER — PANTOPRAZOLE SODIUM 40 MG PO TBEC
80.0000 mg | DELAYED_RELEASE_TABLET | Freq: Every day | ORAL | Status: DC
  Administered 2023-10-06 – 2023-10-08 (×3): 80 mg via ORAL
  Filled 2023-10-05 (×3): qty 2

## 2023-10-05 MED ORDER — APIXABAN 5 MG PO TABS: 5.0000 mg | ORAL_TABLET | Freq: Two times a day (BID) | ORAL | Status: DC

## 2023-10-05 MED ORDER — SODIUM CHLORIDE 0.9 % IV SOLN
250.0000 mL | INTRAVENOUS | Status: DC | PRN
Start: 2023-10-05 — End: 2023-10-06

## 2023-10-05 MED ORDER — ACETAMINOPHEN 650 MG RE SUPP: 650.0000 mg | Freq: Four times a day (QID) | RECTAL | Status: DC | PRN

## 2023-10-05 MED ORDER — FAMOTIDINE 20 MG PO TABS
40.0000 mg | ORAL_TABLET | Freq: Every day | ORAL | Status: DC
  Administered 2023-10-05 – 2023-10-06 (×2): 40 mg via ORAL
  Filled 2023-10-05 (×2): qty 2

## 2023-10-05 NOTE — Assessment & Plan Note (Signed)
Last ECHO 10/04/21 with EF 55-60%, indeterminate DD. Now with mild pulmonary edema on CXR and DOE  Plan Add BNP to lab  ECHO

## 2023-10-05 NOTE — Consult Note (Addendum)
Cardiology Consultation   Patient ID: Eric Lambert MRN: [id number]; DOB: [birth year]  Admit date: 10/05/2023 Date of Consult: 10/05/2023  PCP:  [name], MD   [facility] Providers Cardiologist:  Sherryl Manges, MD  Electrophysiologist:  Sherryl Manges, MD     Patient Profile:   Eric Lambert is a 89 y.o. male with a hx of tachybradycardia syndrome s/p PPM, permanent atrial fibrillation, OSA, bacterial endocarditis, GERD who is being seen 10/05/2023 for the evaluation of dyspnea on exertion/CHF at the request of Dr Deretha Emory.  History of Present Illness:   Mr. Eric Lambert is a 89 year old male with past medical history noted above.  He has been followed by Dr. Graciela Husbands as an outpatient.  Echocardiogram 02/2021 with LVEF of 55 to 60%, normal RV, severely dilated left atrium, moderately dilated right atrium, mitral valve with mild bileaflet prolapse, mild MR.  Most recently he was seen by Dr. Graciela Husbands in the office on 10/15 and noted his device had ERI.  He also reported a longstanding cough in the setting of his ACE inhibitor and lisinopril was stopped.  He underwent a generator replacement on 09/16/2023.  Presented to the ED on 11/11 with complaints of dyspnea and weakness.  Reports he has been in his usual state of health up until Sunday.  Friday afternoon he worked around [employer] and mowed some of his lawn with a Firefighter.  He completed the past of his yard work on Saturday and felt well.  No dyspnea on exertion or chest pain with this activity.  Sunday he reported feeling weak, short of breath and unlike himself.  Does report a history of arthritis which does limit his activity at times.  No family present at bedside at time of interview but admission notes address concerns from his spouse that he may have some degree of dementia.  Ultimately presented to the ED in the early morning hours on 11/11.  In the ED his labs showed sodium 136, potassium 4, creatinine  1.3, high-sensitivity troponin 33>> 33, WBC 8, hemoglobin 13.4.  EKG shows V paced rhythm of 60 bpm.  Chest x-ray with cardiomegaly, concern for pulmonary interstitial edema and small to moderate pleural effusions.  CT head negative.  CT chest with moderate right pleural effusion and small left pleural effusion.  He was ambulated in the ED and became very tachypneic with respiratory rates up into the 30s and O2 sats dropping into the low 90s.  As a result he was admitted to internal medicine for further management.  Cardiology asked to evaluate.   Past Medical History:  Diagnosis Date   Allergy    Arthritis    Atrial fibrillation -permanent    BPH (benign prostatic hyperplasia)    Cancer (HCC)    HX OF SKIN CANCER    CHF (congestive heart failure) (HCC)    resolved after pacemaker - tachycardia induced   Complete heart block (HCC)    Dyspnea    mild   ED (erectile dysfunction)    GERD (gastroesophageal reflux disease)    GLUCOSE INTOLERANCE 10/22/2007   no recent issues   Heart murmur    OSA (obstructive sleep apnea)    NO CPAP    Pacemaker BSX    dual   Pneumonia    HX OF SEVERAL TIMES AS A CHILD    PONV (postoperative nausea and vomiting)    at age 82    Presence of permanent cardiac pacemaker    SUBACUTE BACTERIAL  ENDOCARDITIS 1970s    Past Surgical History:  Procedure Laterality Date   ESOPHAGOGASTRODUODENOSCOPY (EGD) WITH PROPOFOL N/A 01/07/2022   Procedure: ESOPHAGOGASTRODUODENOSCOPY (EGD) WITH PROPOFOL;  Surgeon: Beverley Fiedler, MD;  Location: [facility];  Service: Gastroenterology;  Laterality: N/A;   IMPACTION REMOVAL  01/07/2022   Procedure: IMPACTION REMOVAL;  Surgeon: Beverley Fiedler, MD;  Location: [facility];  Service: Gastroenterology;;   INGUINAL HERNIA REPAIR Bilateral 04/08/2021   Procedure: LAPAROSCOPIC BILATERAL INGUINAL HERNIA REPAIR WITH MESH;  Surgeon: Kinsinger, De Blanch, MD;  Location: [facility];  Service: General;  Laterality: Bilateral;   INSERT /  REPLACE / REMOVE PACEMAKER     PACEMAKER GENERATOR CHANGE N/A 05/07/2012   Procedure: PACEMAKER GENERATOR CHANGE;  Surgeon: Duke Salvia, MD;  Location: [facility] CATH LAB;  Service: Cardiovascular;  Laterality: N/A;   PACEMAKER PLACEMENT     PARTIAL HIP ARTHROPLASTY     2008   PPM GENERATOR CHANGEOUT N/A 09/16/2023   Procedure: PPM GENERATOR CHANGEOUT;  Surgeon: Duke Salvia, MD;  Location: [facility] INVASIVE CV LAB;  Service: Cardiovascular;  Laterality: N/A;   TOTAL HIP ARTHROPLASTY Left 01/07/2022   Procedure: TOTAL HIP ARTHROPLASTY ANTERIOR APPROACH;  Surgeon: Sheral Apley, MD;  Location: [facility];  Service: Orthopedics;  Laterality: Left;     Home Medications:  Prior to Admission medications   Medication Sig Start Date End Date Taking? Authorizing Provider  apixaban (ELIQUIS) 5 MG TABS tablet Take 1 tablet by mouth twice daily 04/07/23  Yes Duke Salvia, MD  Cholecalciferol (VITAMIN D-3 PO) Take 1 tablet by mouth daily.   Yes [provider]  Cyanocobalamin (VITAMIN B-12 PO) Take 2 tablets by mouth daily.   Yes [provider]  famotidine (PEPCID) 40 MG tablet Take 1 tablet (40 mg total) by mouth at bedtime. 12/09/22  Yes Unk Lightning, PA  Multiple Vitamins-Minerals (OCUVITE EYE HEALTH FORMULA PO) Take 1 tablet by mouth in the morning and at bedtime.   Yes [provider]  omeprazole (PRILOSEC) 40 MG capsule TAKE 1 CAPSULE BY MOUTH TWICE DAILY 30-60 MINUTES BEFORE MEALS (BREAKFAST AND DINNER) 07/06/23  Yes Unk Lightning, PA  verapamil (CALAN-SR) 240 MG CR tablet Take 1 tablet by mouth once daily 05/06/23  Yes Duke Salvia, MD    Inpatient Medications: Scheduled Meds:  vitamin B-12  1,000 mcg Oral Daily   famotidine  40 mg Oral QHS   furosemide  40 mg Intravenous Q8H   heparin  5,000 Units Subcutaneous Q8H   [START ON 10/06/2023] pantoprazole  80 mg Oral Daily   senna  1 tablet Oral BID   sodium chloride flush  3 mL Intravenous Q12H    [START ON 10/06/2023] verapamil  240 mg Oral Daily   Continuous Infusions:  sodium chloride     PRN Meds: sodium chloride, acetaminophen **OR** acetaminophen, sodium chloride flush, traZODone  Allergies:   No Known Allergies  Social History:   Social History   Socioeconomic History   Marital status: Married    Spouse name: Not on file   Number of children: 2   Years of education: Not on file   Highest education level: Not on file  Occupational History   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 2 children. 1 grandkid. 1 greatgrandchild.       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: reading, plays bridge on internet, Fish farm manager, enjoys talking politics   Social Determinants of Corporate investment banker Strain: Not on [employer] Insecurity: Not on file  Transportation Needs: Not on file  Physical Activity: Not on file  Stress: Not on file  Social Connections: Not on file  Intimate Partner Violence: Not on file    Family History:    Family History  Problem Relation Age of Onset   Prostate cancer Father    Diabetes Brother    Prostate cancer Brother    Brain cancer Brother    Heart disease Other        mothers side men- strokes and heart attacks   Colon cancer Neg Hx    Pancreatic cancer Neg Hx    Stomach cancer Neg Hx      ROS:  Please see the history of present illness.   All other ROS reviewed and negative.     Physical Exam/Data:   Vitals:   10/05/23 1300 10/05/23 1330 10/05/23 1345 10/05/23 1400  BP: 118/73 121/82 139/63 (!) 175/67  Pulse: 80 70 71 81  Resp:  (!) 22 (!) 31 (!) 28 (!) 26  Temp:      TempSrc:      SpO2: 93% 95% 94% 97%  Weight:      Height:        Intake/Output Summary (Last 24 hours) at 10/05/2023 1611 Last data filed at 10/05/2023 1600 Gross per 24 hour  Intake --  Output 200 ml  Net -200 ml      10/05/2023    8:34 AM 09/16/2023    6:53 AM 09/08/2023    1:50 PM  Last 3 Weights  Weight (lbs) 150 lb 155 lb 151 lb 9.6 oz  Weight (kg) 68.04 kg 70.308 kg 68.765 kg     Body mass index is 21.52 kg/m.  General:  Well nourished, well developed, in no acute distress HEENT: normal Neck: no JVD Vascular: No carotid bruits; Distal pulses 2+ bilaterally Cardiac:  normal S1, S2; irregularly irregular; + systolic murmur  Lungs: Diminished in bases Abd: soft, nontender, no hepatomegaly  Ext: no edema Musculoskeletal:  No deformities, BUE and BLE strength normal and equal Skin: warm and dry  Neuro:  CNs 2-12 intact, no focal abnormalities noted Psych:  Normal affect   EKG:  The EKG was personally reviewed and demonstrates: V paced 60 bpm Telemetry:  Telemetry was personally reviewed and demonstrates: V pacing, PVCs  Relevant CV Studies:  Echo: 02/2021  IMPRESSIONS     1. Left ventricular ejection fraction, by estimation, is 55 to 60%. The  left ventricle has normal function. The left ventricle has no regional  wall motion abnormalities. Left ventricular diastolic parameters are  indeterminate.   2. Right ventricular systolic function is normal. The right ventricular  size is normal. There is normal pulmonary artery systolic pressure. The  estimated right ventricular systolic pressure is 33.8 mmHg.   3. Left atrial size was severely dilated.   4. Right atrial size was mild to moderately dilated.   5. The mitral valve is abnormal with mild bileaflet  prolapse. Mild mitral  valve regurgitation. No evidence of mitral stenosis.   6. The aortic valve is tricuspid. Aortic valve regurgitation is not  visualized. Mild  aortic valve sclerosis is present, with no evidence of  aortic valve stenosis.   7. The inferior vena cava is normal in size with <50% respiratory  variability, suggesting right atrial pressure of 8 mmHg.   8. The patient was in atrial fibrillation.   FINDINGS   Left Ventricle: Left ventricular ejection fraction, by estimation, is 55  to 60%. The left ventricle has normal function. The left ventricle has no  regional wall motion abnormalities. The left ventricular internal cavity  size was normal in size. There is   no left ventricular hypertrophy. Left ventricular diastolic parameters  are indeterminate.   Right Ventricle: The right ventricular size is normal. No increase in  right ventricular wall thickness. Right ventricular systolic function is  normal. There is normal pulmonary artery systolic pressure. The tricuspid  regurgitant velocity is 2.54 m/s, and   with an assumed right atrial pressure of 8 mmHg, the estimated right  ventricular systolic pressure is 33.8 mmHg.   Left Atrium: Left atrial size was severely dilated.   Right Atrium: Right atrial size was mild to moderately dilated.   Pericardium: There is no evidence of pericardial effusion.   Mitral Valve: The mitral valve is abnormal. There is mild calcification of  the mitral valve leaflet(s). Mild mitral annular calcification. Mild  mitral valve regurgitation. No evidence of mitral valve stenosis.   Tricuspid Valve: The tricuspid valve is normal in structure. Tricuspid  valve regurgitation is trivial.   Aortic Valve: The aortic valve is tricuspid. Aortic valve regurgitation is  not visualized. Mild aortic valve sclerosis is present, with no evidence  of aortic valve stenosis.   Pulmonic Valve: The pulmonic valve was normal in structure. Pulmonic valve  regurgitation is trivial.   Aorta: The aortic root is normal in size and structure.   Venous: The inferior vena cava is normal in size with less than 50%   respiratory variability, suggesting right atrial pressure of 8 mmHg.   IAS/Shunts: No atrial level shunt detected by color flow Doppler.   Additional Comments: A device lead is visualized in the right ventricle.   Laboratory Data:  High Sensitivity Troponin:   Recent Labs  Lab 10/05/23 0924 10/05/23 1155  TROPONINIHS 33* 33*     Chemistry Recent Labs  Lab 10/05/23 0924  NA 136  K 4.0  CL 106  CO2 22  GLUCOSE 120*  BUN 39*  CREATININE 1.31*  CALCIUM 9.1  GFRNONAA 51*  ANIONGAP 8    No results for input(s): "PROT", "ALBUMIN", "AST", "ALT", "ALKPHOS", "BILITOT" in the last 168 hours. Lipids No results for input(s): "CHOL", "TRIG", "HDL", "LABVLDL", "LDLCALC", "CHOLHDL" in the last 168 hours.  Hematology Recent Labs  Lab 10/05/23 0924  WBC 8.0  RBC 4.53  HGB 13.4  HCT 40.7  MCV 89.8  MCH 29.6  MCHC 32.9  RDW 14.7  PLT 130*   Thyroid No results for input(s): "TSH", "FREET4" in the last 168 hours.  BNPNo results for input(s): "BNP", "PROBNP" in the last 168 hours.  DDimer No results for input(s): "DDIMER" in the last 168 hours.   Radiology/Studies:  CT Chest W Contrast  Result Date: 10/05/2023 CLINICAL DATA:  Weakness, shortness of breath. Altered mental status. Pneumonia, complication suspected, xray done Respiratory illness, nondiagnostic xray EXAM: CT CHEST WITH CONTRAST TECHNIQUE: Multidetector CT imaging of the  chest was performed during intravenous contrast administration. RADIATION DOSE REDUCTION: This exam was performed according to the departmental dose-optimization program which includes automated exposure control, adjustment of the mA and/or kV according to patient size and/or use of iterative reconstruction technique. CONTRAST:  50mL ISOVUE-370 IOPAMIDOL (ISOVUE-370) INJECTION 76% COMPARISON:  Chest x-ray today FINDINGS: Cardiovascular: Left chest wall pacer in place with leads in the right atrium and right ventricle. Cardiomegaly. Coronary artery and  aortic atherosclerosis. No evidence of aortic aneurysm. No filling defects in the pulmonary arteries to suggest pulmonary emboli. Mediastinum/Nodes: No mediastinal, hilar, or axillary adenopathy. Trachea and esophagus are unremarkable. Thyroid unremarkable. Lungs/Pleura: Moderate right pleural effusion and small left pleural effusion. Compressive/dependent atelectasis in the lower lobes. Otherwise no confluent opacities. Upper Abdomen: No acute findings Musculoskeletal: Chest wall soft tissues are unremarkable. No acute bony abnormality. IMPRESSION: No evidence of pulmonary embolus. Coronary artery disease. Moderate right pleural effusion and small left pleural effusion with compressive/dependent atelectasis in the lower lobes. Aortic Atherosclerosis (ICD10-I70.0). Electronically Signed   By: Charlett Nose M.D.   On: 10/05/2023 12:58   CT Head Wo Contrast  Result Date: 10/05/2023 CLINICAL DATA:  Mental status change, unknown cause EXAM: CT HEAD WITHOUT CONTRAST TECHNIQUE: Contiguous axial images were obtained from the base of the skull through the vertex without intravenous contrast. RADIATION DOSE REDUCTION: This exam was performed according to the departmental dose-optimization program which includes automated exposure control, adjustment of the mA and/or kV according to patient size and/or use of iterative reconstruction technique. COMPARISON:  None Available. FINDINGS: Brain: There is atrophy and chronic small vessel disease changes. No acute intracranial abnormality. Specifically, no hemorrhage, hydrocephalus, mass lesion, acute infarction, or significant intracranial injury. Vascular: No hyperdense vessel or unexpected calcification. Skull: No acute calvarial abnormality. Sinuses/Orbits: No acute findings Other: None IMPRESSION: Atrophy, chronic microvascular disease. No acute intracranial abnormality. Electronically Signed   By: Charlett Nose M.D.   On: 10/05/2023 12:55   DG Chest 2 View  Result Date:  10/05/2023 CLINICAL DATA:  89 year old male with weakness, hypertension, shortness of breath. EXAM: CHEST - 2 VIEW COMPARISON:  Chest radiographs 07/18/2021 and earlier. FINDINGS: AP and lateral views 0859 hours. Lower lung volumes. Cardiomegaly appears progressed. Chronic left chest dual lead pacemaker. New bilateral pleural effusions, small to moderate and greater on the right. Patchy superimposed lung base opacity. Increased pulmonary vascularity elsewhere. No pneumothorax. No definite air bronchograms. No acute osseous abnormality identified. 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 Negative visible bowel gas. IMPRESSION: Cardiomegaly with constellation of abnormal lung opacity most suggestive of pulmonary interstitial edema, pleural effusions (small to moderate). Infection felt less likely. Electronically Signed   By: Odessa Fleming M.D.   On: 10/05/2023 09:06     Assessment and Plan:   Edin Comar is a 89 y.o. male with a hx of tachybradycardia syndrome s/p PPM, permanent atrial fibrillation, OSA, bacterial endocarditis, GERD who is being seen 10/05/2023 for the evaluation of dyspnea on exertion/CHF at the request of Dr Deretha Emory.  HFpEF Pleural effusions -- Presented with progressive shortness of breath that started Sunday into this morning.  CT chest on admission with moderate right-sided pleural effusion and small left-sided pleural effusion -- Echo 02/2021 with LVEF of 55 to 60%, no regional wall motion abnormality, normal RV -- reports compliance with his mediations PTA, does eat out 2-3 times a week, does not usually note the Na+ content of foods -- Has been started on IV Lasix 40 mg  q8 hr, follow up I&O/weight -- Update echocardiogram -- check BNP -- On verapamil 240 mg daily, may need to adjust if EF is down on echo -- consider addition of SGLT2i  Permanent atrial fibrillation -- Rate controlled -- On Eliquis  Tachybradycardia  syndrome status post PPM -- Recent GEN change 10/23 -- Follows with Dr. Graciela Husbands  OSA on CPAP -- Reports compliance  Mitral valve prolapse -- mild bileaflet prolapse with mild aortic valve sclerosis on echo 2022 -- update echo  Risk Assessment/Risk Scores:   New York Heart Association (NYHA) Functional Class NYHA Class II  CHA2DS2-VASc Score = 4   This indicates a 4.8% annual risk of stroke. The patient's score is based upon: CHF History: 1 HTN History: 1 Diabetes History: 0 Stroke History: 0 Vascular Disease History: 0 Age Score: 2 Gender Score: 0  For questions or updates, please contact [facility] Please consult [email or web address] for contact info under    Signed, Laverda Page, NP  10/05/2023 4:11 PM

## 2023-10-05 NOTE — ED Triage Notes (Signed)
Pt reports having hallucinations.

## 2023-10-05 NOTE — ED Notes (Signed)
Pt to radiology.

## 2023-10-05 NOTE — ED Provider Notes (Signed)
[facility] EMERGENCY DEPARTMENT AT [facility] Provider Note   CSN: [id number] Arrival date & time: 10/05/23  2440     History  Chief Complaint  Patient presents with   Hypertension   Hallucinations    Eric Lambert is a 89 y.o. male.  Patient's primary care doctor is with [facility].  Patient also followed by Dr. Graciela Husbands.  Recently had defibrillator replaced.  Patient history of congestive heart failure but resolved after pacemaker placement.  Patient had subacute bacterial endocarditis in the 1970s.  Patient did develop complete heart block.  Patient also has a history of arthritis.  And mild shortness of breath.  But the shortness of breath been getting worse lately worse with exertion.  Patient's spouse feels that there may be a little bit of dementia confusion arriving.  Patient talks about some strange visual findings since the summer.  Did talk to his ophthalmologist about it.  They did not find anything acute.  Patient does have follow-up with [facility] regarding this.  Patient denies any chest pain.  No respiratory symptoms.  No leg swelling.  Patient is not on a diuretic.  Patient is on Eliquis.  And patient is on verapamil sustained-release 240 mg once daily.       Home Medications Prior to Admission medications   Medication Sig Start Date End Date Taking? Authorizing Provider  apixaban (ELIQUIS) 5 MG TABS tablet Take 1 tablet by mouth twice daily 04/07/23  Yes Duke Salvia, MD  Cholecalciferol (VITAMIN D-3 PO) Take 1 tablet by mouth daily.   Yes [provider]  Cyanocobalamin (VITAMIN B-12 PO) Take 2 tablets by mouth daily.   Yes [provider]  famotidine (PEPCID) 40 MG tablet Take 1 tablet (40 mg total) by mouth at bedtime. 12/09/22  Yes Unk Lightning, PA  Multiple Vitamins-Minerals (OCUVITE EYE HEALTH FORMULA PO) Take 1 tablet by mouth in the morning and at bedtime.   Yes [provider]  omeprazole (PRILOSEC) 40 MG  capsule TAKE 1 CAPSULE BY MOUTH TWICE DAILY 30-60 MINUTES BEFORE MEALS (BREAKFAST AND DINNER) 07/06/23  Yes Unk Lightning, PA  verapamil (CALAN-SR) 240 MG CR tablet Take 1 tablet by mouth once daily 05/06/23  Yes Duke Salvia, MD      Allergies    Patient has no known allergies.    Review of Systems   Review of Systems  Constitutional:  Negative for chills and fever.  HENT:  Negative for ear pain and sore throat.   Eyes:  Positive for visual disturbance. Negative for pain.  Respiratory:  Positive for shortness of breath. Negative for cough.   Cardiovascular:  Negative for chest pain, palpitations and leg swelling.  Gastrointestinal:  Negative for abdominal pain and vomiting.  Genitourinary:  Negative for dysuria and hematuria.  Musculoskeletal:  Negative for arthralgias and back pain.  Skin:  Negative for color change and rash.  Neurological:  Negative for seizures and syncope.  Psychiatric/Behavioral:  Positive for confusion.   All other systems reviewed and are negative.   Physical Exam Updated Vital Signs BP 139/63   Pulse 71   Temp 98.2 F (36.8 C) (Oral)   Resp (!) 28   Ht 1.778 m (5\' 10" )   Wt 68 kg   SpO2 94%   BMI 21.52 kg/m  Physical Exam Vitals and nursing note reviewed.  Constitutional:      General: He is not in acute distress.    Appearance: Normal appearance. He is  well-developed.  HENT:     Head: Normocephalic and atraumatic.  Eyes:     Extraocular Movements: Extraocular movements intact.     Conjunctiva/sclera: Conjunctivae normal.     Pupils: Pupils are equal, round, and reactive to light.  Cardiovascular:     Rate and Rhythm: Normal rate and regular rhythm.     Heart sounds: No murmur heard. Pulmonary:     Effort: Pulmonary effort is normal. No respiratory distress.     Breath sounds: Normal breath sounds. No wheezing, rhonchi or rales.     Comments: Left anterior chest with healing pacemaker placement pocket. Abdominal:      Palpations: Abdomen is soft.     Tenderness: There is no abdominal tenderness.  Musculoskeletal:        General: No swelling.     Cervical back: Normal range of motion and neck supple.     Right lower leg: No edema.     Left lower leg: No edema.  Skin:    General: Skin is warm and dry.     Capillary Refill: Capillary refill takes less than 2 seconds.  Neurological:     General: No focal deficit present.     Mental Status: He is alert and oriented to person, place, and time.     Cranial Nerves: No cranial nerve deficit.     Sensory: No sensory deficit.     Motor: No weakness.  Psychiatric:        Mood and Affect: Mood normal.     ED Results / Procedures / Treatments   Labs (all labs ordered are listed, but only abnormal results are displayed) Labs Reviewed  BASIC METABOLIC PANEL - Abnormal; Notable for the following components:      Result Value   Glucose, Bld 120 (*)    BUN 39 (*)    Creatinine, Ser 1.31 (*)    GFR, Estimated 51 (*)    All other components within normal limits  CBC - Abnormal; Notable for the following components:   Platelets 130 (*)    All other components within normal limits  URINALYSIS, ROUTINE W REFLEX MICROSCOPIC - Abnormal; Notable for the following components:   Specific Gravity, Urine >1.046 (*)    Ketones, ur 5 (*)    Protein, ur 30 (*)    All other components within normal limits  TROPONIN I (HIGH SENSITIVITY) - Abnormal; Notable for the following components:   Troponin I (High Sensitivity) 33 (*)    All other components within normal limits  TROPONIN I (HIGH SENSITIVITY) - Abnormal; Notable for the following components:   Troponin I (High Sensitivity) 33 (*)    All other components within normal limits    EKG EKG Interpretation Date/Time:  Monday October 05 2023 08:17:02 EST Ventricular Rate:  60 PR Interval:    QRS Duration:  182 QT Interval:  572 QTC Calculation: 572 R Axis:   -82  Text Interpretation: Ventricular-paced rhythm  Abnormal ECG When compared with ECG of 08-Sep-2023 13:48, PREVIOUS ECG IS PRESENT No significant change since last tracing Confirmed by Vanetta Mulders [phone]) on 10/05/2023 10:29:25 AM  Radiology CT Chest W Contrast  Result Date: 10/05/2023 CLINICAL DATA:  Weakness, shortness of breath. Altered mental status. Pneumonia, complication suspected, xray done Respiratory illness, nondiagnostic xray EXAM: CT CHEST WITH CONTRAST TECHNIQUE: Multidetector CT imaging of the chest was performed during intravenous contrast administration. RADIATION DOSE REDUCTION: This exam was performed according to the departmental dose-optimization program which includes automated exposure control, adjustment  of the mA and/or kV according to patient size and/or use of iterative reconstruction technique. CONTRAST:  50mL ISOVUE-370 IOPAMIDOL (ISOVUE-370) INJECTION 76% COMPARISON:  Chest x-ray today FINDINGS: Cardiovascular: Left chest wall pacer in place with leads in the right atrium and right ventricle. Cardiomegaly. Coronary artery and aortic atherosclerosis. No evidence of aortic aneurysm. No filling defects in the pulmonary arteries to suggest pulmonary emboli. Mediastinum/Nodes: No mediastinal, hilar, or axillary adenopathy. Trachea and esophagus are unremarkable. Thyroid unremarkable. Lungs/Pleura: Moderate right pleural effusion and small left pleural effusion. Compressive/dependent atelectasis in the lower lobes. Otherwise no confluent opacities. Upper Abdomen: No acute findings Musculoskeletal: Chest wall soft tissues are unremarkable. No acute bony abnormality. IMPRESSION: No evidence of pulmonary embolus. Coronary artery disease. Moderate right pleural effusion and small left pleural effusion with compressive/dependent atelectasis in the lower lobes. Aortic Atherosclerosis (ICD10-I70.0). Electronically Signed   By: Charlett Nose M.D.   On: 10/05/2023 12:58   CT Head Wo Contrast  Result Date: 10/05/2023 CLINICAL DATA:   Mental status change, unknown cause EXAM: CT HEAD WITHOUT CONTRAST TECHNIQUE: Contiguous axial images were obtained from the base of the skull through the vertex without intravenous contrast. RADIATION DOSE REDUCTION: This exam was performed according to the departmental dose-optimization program which includes automated exposure control, adjustment of the mA and/or kV according to patient size and/or use of iterative reconstruction technique. COMPARISON:  None Available. FINDINGS: Brain: There is atrophy and chronic small vessel disease changes. No acute intracranial abnormality. Specifically, no hemorrhage, hydrocephalus, mass lesion, acute infarction, or significant intracranial injury. Vascular: No hyperdense vessel or unexpected calcification. Skull: No acute calvarial abnormality. Sinuses/Orbits: No acute findings Other: None IMPRESSION: Atrophy, chronic microvascular disease. No acute intracranial abnormality. Electronically Signed   By: Charlett Nose M.D.   On: 10/05/2023 12:55   DG Chest 2 View  Result Date: 10/05/2023 CLINICAL DATA:  89 year old male with weakness, hypertension, shortness of breath. EXAM: CHEST - 2 VIEW COMPARISON:  Chest radiographs 07/18/2021 and earlier. FINDINGS: AP and lateral views 0859 hours. Lower lung volumes. Cardiomegaly appears progressed. Chronic left chest dual lead pacemaker. New bilateral pleural effusions, small to moderate and greater on the right. Patchy superimposed lung base opacity. Increased pulmonary vascularity elsewhere. No pneumothorax. No definite air bronchograms. No acute osseous abnormality identified. 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 Negative visible bowel gas. IMPRESSION: Cardiomegaly with constellation of abnormal lung opacity most suggestive of pulmonary interstitial edema, pleural effusions (small to moderate). Infection felt less likely. Electronically Signed   By: Odessa Fleming M.D.   On: 10/05/2023  09:06    Procedures Procedures    Medications Ordered in ED Medications  iopamidol (ISOVUE-370) 76 % injection 50 mL (50 mLs Intravenous Contrast Given 10/05/23 1106)    ED Course/ Medical Decision Making/ A&P                                 Medical Decision Making Amount and/or Complexity of Data Reviewed Labs: ordered. Radiology: ordered.  Risk Prescription drug management. Decision regarding hospitalization.   Chest x-ray without any acute findings.  Troponins x 2 were both 33.  Patient metabolic panel GFR 51 creatinine 1.31 electrolytes normal.  CBC no leukocytosis hemoglobin 13.4.  Urinalysis negative for urinary tract infection but did seem to be fairly concentrated.  Because of the visual changes opted to do head CT and also with some development of some confusion head CT  without any acute findings.  CT chest was done just to rule out any occult pneumonia.  That showed evidence of a moderate right pleural effusion small left pleural effusion.  Does not sound too impressive.  But when you ambulate him he gets very tachypneic respiratory rate goes up into the 30s.  Oxygen saturations do drop down to around 90-91% but do not go below 90.   Because of his dyspnea on exertion being as significant as it is I did discuss with the hospitalist with consideration may be for tapping of the fluid may be some diuretic use.  Also did discuss with cardiology who will see him in consult.  Do not feel that this is a direct cardiology event no evidence of pulmonary edema and troponins have been stable.  Also we did interrogate his pacemaker and there was not anything drastic on those findings.    Final Clinical Impression(s) / ED Diagnoses Final diagnoses:  Dyspnea on exertion  Pleural effusion    Rx / DC Orders ED Discharge Orders     None         Vanetta Mulders, MD 10/05/23 1416

## 2023-10-05 NOTE — ED Notes (Signed)
Pt's wife just called. She has taken his cane home.

## 2023-10-05 NOTE — ED Notes (Signed)
Pt returned from radiology.

## 2023-10-05 NOTE — Assessment & Plan Note (Signed)
Holding diuresis today.

## 2023-10-05 NOTE — H&P (Signed)
History and Physical    Eric Lambert QMV:[id number] DOB: [birth year] DOA: 10/05/2023  DOS: the patient was seen and examined on 10/05/2023  PCP: [name], MD   Patient coming from: Home  I have personally briefly reviewed patient's old medical records in [facility]  Eric Lambert, a 89 y/o followe by Dr. Durene Cal at [facility] for Primary care, Dr. Graciela Husbands for cardiology/EP.  He recently had defibrillator generator replaced. He has a history of congestive heart failure which resolved after pacemaker placement. Patient had subacute bacterial endocarditis in the 1970s with subsequent complete heart block. Patient also has a history of arthritis and mild shortness of breath which has been getting worse lately worse with exertion. Patient's spouse feels that there may be a little bit of dementia confusion arriving. Patient talks about some strange visual findings since the summer. Did talk to his ophthalmologist about it. They did not find anything acute. Patient does have follow-up with [facility] regarding this. Patient denies any chest pain. No respiratory symptoms. No leg swelling. Patient is not on a diuretic. Patient is on Eliquis. And patient is on verapamil sustained-release 240 mg once daily. Due to his progressive DOE he presents to [facility] for evaluation   ED Course: afebrile, 175/67  HR 81  RR 26. General - well preserved elderly gentleman in no distress. Patient maintained nl oxygen saturation but with ambulation RR jumped to > 40. Lab glucose 120, Cr 1.31 (baseline), CBC nl, Troponin 33 to 33. U/A negative. CT head negative. CT chest with contrast - no PE, moderate right pleural and small left pleural effusions. CXR with cardiomegaly, question of pulmonary edema.  Review of Systems:  Review of Systems  Constitutional:  Negative for chills, fever, malaise/fatigue and weight loss.  HENT: Negative.    Eyes:  Positive for blurred vision. Negative for pain and discharge.   Respiratory:  Positive for shortness of breath. Negative for cough, hemoptysis and wheezing.   Cardiovascular:  Negative for chest pain, palpitations, leg swelling and PND.  Gastrointestinal: Negative.   Genitourinary: Negative.   Musculoskeletal: Negative.   Skin: Negative.   Neurological: Negative.   Endo/Heme/Allergies: Negative.   Psychiatric/Behavioral: Negative.      Past Medical History:  Diagnosis Date   Allergy    Arthritis    Atrial fibrillation -permanent    BPH (benign prostatic hyperplasia)    Cancer (HCC)    HX OF SKIN CANCER    CHF (congestive heart failure) (HCC)    resolved after pacemaker - tachycardia induced   Complete heart block (HCC)    Dyspnea    mild   ED (erectile dysfunction)    GERD (gastroesophageal reflux disease)    GLUCOSE INTOLERANCE 10/22/2007   no recent issues   Heart murmur    OSA (obstructive sleep apnea)    NO CPAP    Pacemaker BSX    dual   Pneumonia    HX OF SEVERAL TIMES AS A CHILD    PONV (postoperative nausea and vomiting)    at age 36    Presence of permanent cardiac pacemaker    SUBACUTE BACTERIAL ENDOCARDITIS 1970s    Past Surgical History:  Procedure Laterality Date   ESOPHAGOGASTRODUODENOSCOPY (EGD) WITH PROPOFOL N/A 01/07/2022   Procedure: ESOPHAGOGASTRODUODENOSCOPY (EGD) WITH PROPOFOL;  Surgeon: Beverley Fiedler, MD;  Location: [facility] ENDOSCOPY;  Service: Gastroenterology;  Laterality: N/A;   IMPACTION REMOVAL  01/07/2022   Procedure: IMPACTION REMOVAL;  Surgeon: Beverley Fiedler, MD;  Location: [facility]  ENDOS[facility];  Service: Gastroenterology;;   INGUINAL HERNIA REPAIR Bilateral 04/08/2021   Procedure: LAPAROSCOPIC BILATERAL INGUINAL HERNIA REPAIR WITH MESH;  Surgeon: Kinsinger, De Blanch, MD;  Location: [facility] ORS;  Service: General;  Laterality: Bilateral;   INSERT / REPLACE / REMOVE PACEMAKER     PACEMAKER GENERATOR CHANGE N/A 05/07/2012   Procedure: PACEMAKER GENERATOR CHANGE;  Surgeon: [facility] Salvia, MD;  Location: [facility] CATH LAB;   Service: Cardiovascular;  Laterality: N/A;   PACEMAKER PLACEMENT     PARTIAL HIP ARTHROPLASTY     2008   PPM GENERATOR CHANGEOUT N/A 09/16/2023   Procedure: PPM GENERATOR CHANGEOUT;  Surgeon: [facility] Salvia, MD;  Location: [facility] INVASIVE CV LAB;  Service: Cardiovascular;  Laterality: N/A;   TOTAL HIP ARTHROPLASTY Left 01/07/2022   Procedure: TOTAL HIP ARTHROPLASTY ANTERIOR APPROACH;  Surgeon: Sheral Apley, MD;  Location: [facility] ORS;  Service: Orthopedics;  Laterality: Left;    Social Hx - married 62 years, one son, one daughter, two grandchildren. Retired Astronomer. Lives with wife and they are independent. They have not discussed or executed Living Will. This was discussed at length: probability of success in a 89 y/o and the sequelae of success.    reports that he has never smoked. He has never used smokeless tobacco. He reports that he does not currently use alcohol. He reports that he does not use drugs.  No Known Allergies  Family History  Problem Relation Age of Onset   Prostate cancer Father    Diabetes Brother    Prostate cancer Brother    Brain cancer Brother    Heart disease Other        mothers side men- strokes and heart attacks   Colon cancer Neg Hx    Pancreatic cancer Neg Hx    Stomach cancer Neg Hx     Prior to Admission medications   Medication Sig Start Date End Date Taking? Authorizing Provider  apixaban (ELIQUIS) 5 MG TABS tablet Take 1 tablet by mouth twice daily 04/07/23  Yes [facility] Salvia, MD  Cholecalciferol (VITAMIN D-3 PO) Take 1 tablet by mouth daily.   Yes [provider]  Cyanocobalamin (VITAMIN B-12 PO) Take 2 tablets by mouth daily.   Yes [provider]  famotidine (PEPCID) 40 MG tablet Take 1 tablet (40 mg total) by mouth at bedtime. 12/09/22  Yes Unk Lightning, PA  Multiple Vitamins-Minerals (OCUVITE EYE HEALTH FORMULA PO) Take 1 tablet by mouth in the morning and at bedtime.   Yes [provider]   omeprazole (PRILOSEC) 40 MG capsule TAKE 1 CAPSULE BY MOUTH TWICE DAILY 30-60 MINUTES BEFORE MEALS (BREAKFAST AND DINNER) 07/06/23  Yes Unk Lightning, PA  verapamil (CALAN-SR) 240 MG CR tablet Take 1 tablet by mouth once daily 05/06/23  Yes Duke Salvia, MD    Physical Exam: Vitals:   10/05/23 1300 10/05/23 1330 10/05/23 1345 10/05/23 1400  BP: 118/73 121/82 139/63 (!) 175/67  Pulse: 80 70 71 81  Resp: (!) 22 (!) 31 (!) 28 (!) 26  Temp:      TempSrc:      SpO2: 93% 95% 94% 97%  Weight:      Height:        Physical Exam Vitals and nursing note reviewed.  Constitutional:      General: He is not in acute distress.    Appearance: Normal appearance. He is normal weight. He is not ill-appearing.  HENT:     Head: Normocephalic and  atraumatic.     Mouth/Throat:     Mouth: Mucous membranes are moist.     Pharynx: Oropharynx is clear.     Comments: Native dentition Eyes:     Extraocular Movements: Extraocular movements intact.     Conjunctiva/sclera: Conjunctivae normal.     Pupils: Pupils are equal, round, and reactive to light.  Neck:     Vascular: No carotid bruit.  Cardiovascular:     Rate and Rhythm: Normal rate. Rhythm irregular.     Pulses: Normal pulses.     Heart sounds: Normal heart sounds.     Comments: Recent surgical wound left upper chest where he had pacemaker generator replaced. No exudate or inflammation Pulmonary:     Effort: Pulmonary effort is normal. No respiratory distress.     Breath sounds: Rales present. No wheezing.     Comments: Dullness to percussion mid-way down on right, at the base on the left. Decreased breath sounds similar locations. Feint rales heard bilaterally. No Rhonchi, no wheezing.  Abdominal:     General: Bowel sounds are normal.     Palpations: Abdomen is soft.  Musculoskeletal:        General: No swelling or deformity. Normal range of motion.     Cervical back: Normal range of motion and neck supple.     Right lower leg:  No edema.     Left lower leg: No edema.  Skin:    General: Skin is warm and dry.  Neurological:     Mental Status: He is alert and oriented to person, place, and time. Mental status is at baseline.     Cranial Nerves: No cranial nerve deficit.  Psychiatric:        Mood and Affect: Mood normal.        Behavior: Behavior normal.        Thought Content: Thought content normal.      Labs on Admission: I have personally reviewed following labs and imaging studies  CBC: Recent Labs  Lab 10/05/23 0924  WBC 8.0  HGB 13.4  HCT 40.7  MCV 89.8  PLT 130*   Basic Metabolic Panel: Recent Labs  Lab 10/05/23 0924  NA 136  K 4.0  CL 106  CO2 22  GLUCOSE 120*  BUN 39*  CREATININE 1.31*  CALCIUM 9.1   GFR: Estimated Creatinine Clearance: 34.6 mL/min (A) (by C-G formula based on SCr of 1.31 mg/dL (H)). Liver Function Tests: No results for input(s): "AST", "ALT", "ALKPHOS", "BILITOT", "PROT", "ALBUMIN" in the last 168 hours. No results for input(s): "LIPASE", "AMYLASE" in the last 168 hours. No results for input(s): "AMMONIA" in the last 168 hours. Coagulation Profile: No results for input(s): "INR", "PROTIME" in the last 168 hours. Cardiac Enzymes: No results for input(s): "CKTOTAL", "CKMB", "CKMBINDEX", "TROPONINI" in the last 168 hours. BNP (last 3 results) No results for input(s): "PROBNP" in the last 8760 hours. HbA1C: No results for input(s): "HGBA1C" in the last 72 hours. CBG: No results for input(s): "GLUCAP" in the last 168 hours. Lipid Profile: No results for input(s): "CHOL", "HDL", "LDLCALC", "TRIG", "CHOLHDL", "LDLDIRECT" in the last 72 hours. Thyroid Function Tests: No results for input(s): "TSH", "T4TOTAL", "FREET4", "T3FREE", "THYROIDAB" in the last 72 hours. Anemia Panel: No results for input(s): "VITAMINB12", "FOLATE", "FERRITIN", "TIBC", "IRON", "RETICCTPCT" in the last 72 hours. Urine analysis:    Component Value Date/Time   COLORURINE YELLOW 10/05/2023  0846   APPEARANCEUR CLEAR 10/05/2023 0846   LABSPEC >1.046 (H) 10/05/2023 0865  PHURINE 5.0 10/05/2023 0846   GLUCOSEU NEGATIVE 10/05/2023 0846   HGBUR NEGATIVE 10/05/2023 0846   BILIRUBINUR NEGATIVE 10/05/2023 0846   BILIRUBINUR positive 1+ 07/18/2021 1424   KETONESUR 5 (A) 10/05/2023 0846   PROTEINUR 30 (A) 10/05/2023 0846   UROBILINOGEN 1.0 07/18/2021 1424   UROBILINOGEN 0.2 09/28/2007 1424   NITRITE NEGATIVE 10/05/2023 0846   LEUKOCYTESUR NEGATIVE 10/05/2023 0846    Radiological Exams on Admission: I have personally reviewed images CT Chest W Contrast  Result Date: 10/05/2023 CLINICAL DATA:  Weakness, shortness of breath. Altered mental status. Pneumonia, complication suspected, xray done Respiratory illness, nondiagnostic xray EXAM: CT CHEST WITH CONTRAST TECHNIQUE: Multidetector CT imaging of the chest was performed during intravenous contrast administration. RADIATION DOSE REDUCTION: This exam was performed according to the departmental dose-optimization program which includes automated exposure control, adjustment of the mA and/or kV according to patient size and/or use of iterative reconstruction technique. CONTRAST:  50mL ISOVUE-370 IOPAMIDOL (ISOVUE-370) INJECTION 76% COMPARISON:  Chest x-ray today FINDINGS: Cardiovascular: Left chest wall pacer in place with leads in the right atrium and right ventricle. Cardiomegaly. Coronary artery and aortic atherosclerosis. No evidence of aortic aneurysm. No filling defects in the pulmonary arteries to suggest pulmonary emboli. Mediastinum/Nodes: No mediastinal, hilar, or axillary adenopathy. Trachea and esophagus are unremarkable. Thyroid unremarkable. Lungs/Pleura: Moderate right pleural effusion and small left pleural effusion. Compressive/dependent atelectasis in the lower lobes. Otherwise no confluent opacities. Upper Abdomen: No acute findings Musculoskeletal: Chest wall soft tissues are unremarkable. No acute bony abnormality. IMPRESSION:  No evidence of pulmonary embolus. Coronary artery disease. Moderate right pleural effusion and small left pleural effusion with compressive/dependent atelectasis in the lower lobes. Aortic Atherosclerosis (ICD10-I70.0). Electronically Signed   By: Charlett Nose M.D.   On: 10/05/2023 12:58   CT Head Wo Contrast  Result Date: 10/05/2023 CLINICAL DATA:  Mental status change, unknown cause EXAM: CT HEAD WITHOUT CONTRAST TECHNIQUE: Contiguous axial images were obtained from the base of the skull through the vertex without intravenous contrast. RADIATION DOSE REDUCTION: This exam was performed according to the departmental dose-optimization program which includes automated exposure control, adjustment of the mA and/or kV according to patient size and/or use of iterative reconstruction technique. COMPARISON:  None Available. FINDINGS: Brain: There is atrophy and chronic small vessel disease changes. No acute intracranial abnormality. Specifically, no hemorrhage, hydrocephalus, mass lesion, acute infarction, or significant intracranial injury. Vascular: No hyperdense vessel or unexpected calcification. Skull: No acute calvarial abnormality. Sinuses/Orbits: No acute findings Other: None IMPRESSION: Atrophy, chronic microvascular disease. No acute intracranial abnormality. Electronically Signed   By: Charlett Nose M.D.   On: 10/05/2023 12:55   DG Chest 2 View  Result Date: 10/05/2023 CLINICAL DATA:  89 year old male with weakness, hypertension, shortness of breath. EXAM: CHEST - 2 VIEW COMPARISON:  Chest radiographs 07/18/2021 and earlier. FINDINGS: AP and lateral views 0859 hours. Lower lung volumes. Cardiomegaly appears progressed. Chronic left chest dual lead pacemaker. New bilateral pleural effusions, small to moderate and greater on the right. Patchy superimposed lung base opacity. Increased pulmonary vascularity elsewhere. No pneumothorax. No definite air bronchograms. No acute osseous abnormality identified.  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 Negative visible bowel gas. IMPRESSION: Cardiomegaly with constellation of abnormal lung opacity most suggestive of pulmonary interstitial edema, pleural effusions (small to moderate). Infection felt less likely. Electronically Signed   By: Odessa Fleming M.D.   On: 10/05/2023 09:06    EKG: I have personally reviewed EKG: paced rhythm  Assessment/Plan  Active Problems:   Obstructive sleep apnea   Essential hypertension   Permanent atrial fibrillation (HCC)   GERD   (HFpEF) heart failure with preserved ejection fraction (HCC)   Pleural effusion    Assessment and Plan: Pleural effusion Patient with DOE while maintaining oxygen saturation. He reports onset several days ago. Denies, chest pain, respiratory illness, trauma. Denies prior h/o pleural effusion. He has been maintained on eliquis for a fib. He had recent procedure with replacement of implantable pacemaker generator and was off Eliquis for several days. CT chest with contrast negative for PE. Degree of heart failure inconsistent with moderate pleural effusion but possible.  Plan IV lasix  If no improvement in pleural effusion may come to diagnostic and therapeutic thoracentesis: transudate vs malignant effusion vs other  (HFpEF) heart failure with preserved ejection fraction (HCC) Last ECHO 10/04/21 with EF 55-60%, indeterminate DD. Now with mild pulmonary edema on CXR and DOE  Plan Add BNP to lab  ECHO  GERD Denies any GI symptoms: pain, N/V, change in bowels  Plan Continue home regimen  Permanent atrial fibrillation [facility]) Patient followed by cardiology. He has been stable.  PLan Hold Eliquis  Essential hypertension BP stable. Will continue home regimen  Obstructive sleep apnea Will continue CPAP using home settings       DVT prophylaxis: SQ Heparin Code Status: Full Code Family Communication: wife present during interview   Disposition Plan: home when stable  Consults called: cardiology - [facility] called by EDP  Admission status: Inpatient, Telemetry bed   Illene Regulus, MD [facility] 10/05/2023, 3:31 PM

## 2023-10-05 NOTE — Assessment & Plan Note (Signed)
Reate controlled with verapamil, plan to continue anticoagulation with apixaban.  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

## 2023-10-05 NOTE — Subjective & Objective (Signed)
Eric Lambert, a 89 y/o followe by Dr. Durene Cal at [facility] for Primary care, Dr. Graciela Husbands for cardiology/EP.  He recently had defibrillator generator replaced. He has a history of congestive heart failure which resolved after pacemaker placement. Patient had subacute bacterial endocarditis in the 1970s with subsequent complete heart block. Patient also has a history of arthritis and mild shortness of breath which has been getting worse lately worse with exertion. Patient's spouse feels that there may be a little bit of dementia confusion arriving. Patient talks about some strange visual findings since the summer. Did talk to his ophthalmologist about it. They did not find anything acute. Patient does have follow-up with [facility] regarding this. Patient denies any chest pain. No respiratory symptoms. No leg swelling. Patient is not on a diuretic. Patient is on Eliquis. And patient is on verapamil sustained-release 240 mg once daily. Due to his progressive DOE he presents to [facility] for evaluation

## 2023-10-05 NOTE — Assessment & Plan Note (Signed)
Continue PPI ?

## 2023-10-05 NOTE — ED Notes (Signed)
ED TO INPATIENT HANDOFF REPORT  ED Nurse Name and Phone #: Vernona Rieger [phone]  S Name/Age/Gender Eric Lambert 89 y.o. male Room/Bed: 039C/039C  Code Status   Code Status: Full Code  Home/SNF/Other Home Patient oriented to: self, place, time, and situation Is this baseline? Yes   Triage Complete: Triage complete  Chief Complaint (HFpEF) heart failure with preserved ejection fraction (HCC) [I50.30]  Triage Note Pt coming in complaining of having trouble sleeping. Pt reports having high blood pressure. Pt reports he was unable to sleep on Saturday night . Pt reports having anxiety   Pt has a [employer] pacemaker  Pt reports having hallucinations.    Allergies No Known Allergies  Level of Care/Admitting Diagnosis ED Disposition     ED Disposition  Admit   Condition  --   Comment  Hospital Area: [facility] [[id number]]  Level of Care: Telemetry Cardiac [[id number]]  May admit patient to [facility] or [facility] if equivalent level of care is available:: No  Covid Evaluation: Asymptomatic - no recent exposure (last 10 days) testing not required  Diagnosis: (HFpEF) heart failure with preserved ejection fraction [facility]) [[id number]]  Admitting Physician: Jacques Navy [[id number]]  Attending Physician: Jacques Navy [[id number]]  Certification:: I certify this patient will need inpatient services for at least 2 midnights  Expected Medical Readiness: 10/08/2023          B Medical/Surgery History Past Medical History:  Diagnosis Date   Allergy    Arthritis    Atrial fibrillation -permanent    BPH (benign prostatic hyperplasia)    Cancer (HCC)    HX OF SKIN CANCER    CHF (congestive heart failure) (HCC)    resolved after pacemaker - tachycardia induced   Complete heart block (HCC)    Dyspnea    mild   ED (erectile dysfunction)    GERD (gastroesophageal reflux disease)    GLUCOSE INTOLERANCE 10/22/2007   no recent issues   Heart murmur    OSA  (obstructive sleep apnea)    NO CPAP    Pacemaker BSX    dual   Pneumonia    HX OF SEVERAL TIMES AS A CHILD    PONV (postoperative nausea and vomiting)    at age 87    Presence of permanent cardiac pacemaker    SUBACUTE BACTERIAL ENDOCARDITIS 1970s   Past Surgical History:  Procedure Laterality Date   ESOPHAGOGASTRODUODENOSCOPY (EGD) WITH PROPOFOL N/A 01/07/2022   Procedure: ESOPHAGOGASTRODUODENOSCOPY (EGD) WITH PROPOFOL;  Surgeon: Beverley Fiedler, MD;  Location: [facility];  Service: Gastroenterology;  Laterality: N/A;   IMPACTION REMOVAL  01/07/2022   Procedure: IMPACTION REMOVAL;  Surgeon: Beverley Fiedler, MD;  Location: [facility];  Service: Gastroenterology;;   INGUINAL HERNIA REPAIR Bilateral 04/08/2021   Procedure: LAPAROSCOPIC BILATERAL INGUINAL HERNIA REPAIR WITH MESH;  Surgeon: Kinsinger, De Blanch, MD;  Location: [facility];  Service: General;  Laterality: Bilateral;   INSERT / REPLACE / REMOVE PACEMAKER     PACEMAKER GENERATOR CHANGE N/A 05/07/2012   Procedure: PACEMAKER GENERATOR CHANGE;  Surgeon: Duke Salvia, MD;  Location: [facility] CATH LAB;  Service: Cardiovascular;  Laterality: N/A;   PACEMAKER PLACEMENT     PARTIAL HIP ARTHROPLASTY     2008   PPM GENERATOR CHANGEOUT N/A 09/16/2023   Procedure: PPM GENERATOR CHANGEOUT;  Surgeon: Duke Salvia, MD;  Location: [facility] INVASIVE CV LAB;  Service: Cardiovascular;  Laterality: N/A;   TOTAL HIP ARTHROPLASTY Left 01/07/2022   Procedure:  TOTAL HIP ARTHROPLASTY ANTERIOR APPROACH;  Surgeon: Sheral Apley, MD;  Location: [facility];  Service: Orthopedics;  Laterality: Left;     A IV Location/Drains/Wounds Patient Lines/Drains/Airways Status     Active Line/Drains/Airways     Name Placement date Placement time Site Days   Peripheral IV 10/05/23 20 G Right Antecubital 10/05/23  0924  Antecubital  less than 1            Intake/Output Last 24 hours  Intake/Output Summary (Last 24 hours) at 10/05/2023 1803 Last data filed at  10/05/2023 1745 Gross per 24 hour  Intake --  Output 800 ml  Net -800 ml    Labs/Imaging Results for orders placed or performed during the hospital encounter of 10/05/23 (from the past 48 hour(s))  Urinalysis, Routine w reflex microscopic -Urine, Clean Catch     Status: Abnormal   Collection Time: 10/05/23  8:46 AM  Result Value Ref Range   Color, Urine YELLOW YELLOW   APPearance CLEAR CLEAR   Specific Gravity, Urine >1.046 (H) 1.005 - 1.030   pH 5.0 5.0 - 8.0   Glucose, UA NEGATIVE NEGATIVE mg/dL   Hgb urine dipstick NEGATIVE NEGATIVE   Bilirubin Urine NEGATIVE NEGATIVE   Ketones, ur 5 (A) NEGATIVE mg/dL   Protein, ur 30 (A) NEGATIVE mg/dL   Nitrite NEGATIVE NEGATIVE   Leukocytes,Ua NEGATIVE NEGATIVE   RBC / HPF 0-5 0 - 5 RBC/hpf   WBC, UA 0-5 0 - 5 WBC/hpf   Bacteria, UA NONE SEEN NONE SEEN   Squamous Epithelial / HPF 0-5 0 - 5 /HPF   Mucus PRESENT     Comment: Performed at [facility], [address] [place], [place] [address]  Basic metabolic panel     Status: Abnormal   Collection Time: 10/05/23  9:24 AM  Result Value Ref Range   Sodium 136 135 - 145 mmol/L   Potassium 4.0 3.5 - 5.1 mmol/L   Chloride 106 98 - 111 mmol/L   CO2 22 22 - 32 mmol/L   Glucose, Bld 120 (H) 70 - 99 mg/dL    Comment: Glucose reference range applies only to samples taken after fasting for at least 8 hours.   BUN 39 (H) 8 - 23 mg/dL   Creatinine, Ser 3.87 (H) 0.61 - 1.24 mg/dL   Calcium 9.1 8.9 - 56.4 mg/dL   GFR, Estimated 51 (L) >60 mL/min    Comment: (NOTE) Calculated using the CKD-EPI Creatinine Equation (2021)    Anion gap 8 5 - 15    Comment: Performed at [facility], [address] [place], [place] [address]  CBC     Status: Abnormal   Collection Time: 10/05/23  9:24 AM  Result Value Ref Range   WBC 8.0 4.0 - 10.5 K/uL   RBC 4.53 4.22 - 5.81 MIL/uL   Hemoglobin 13.4 13.0 - 17.0 g/dL   HCT 18.8 41.6 - 60.6 %   MCV 89.8 80.0 - 100.0 fL   MCH 29.6 26.0 - 34.0 pg    MCHC 32.9 30.0 - 36.0 g/dL   RDW 30.1 60.1 - 09.3 %   Platelets 130 (L) 150 - 400 K/uL   nRBC 0.0 0.0 - 0.2 %    Comment: Performed at [facility], [address] [place], [place] [address]  Troponin I (High Sensitivity)     Status: Abnormal   Collection Time: 10/05/23  9:24 AM  Result Value Ref Range   Troponin I (  High Sensitivity) 33 (H) <18 ng/L    Comment: (NOTE) Elevated high sensitivity troponin I (hsTnI) values and significant  changes across serial measurements may suggest ACS but many other  chronic and acute conditions are known to elevate hsTnI results.  Refer to the "Links" section for chest pain algorithms and additional  guidance. Performed at [facility], [address], [place], [place] [address]   Troponin I (High Sensitivity)     Status: Abnormal   Collection Time: 10/05/23 11:55 AM  Result Value Ref Range   Troponin I (High Sensitivity) 33 (H) <18 ng/L    Comment: (NOTE) Elevated high sensitivity troponin I (hsTnI) values and significant  changes across serial measurements may suggest ACS but many other  chronic and acute conditions are known to elevate hsTnI results.  Refer to the "Links" section for chest pain algorithms and additional  guidance. Performed at [facility], [address], [place], [place] [address]    CT Chest W Contrast  Result Date: 10/05/2023 CLINICAL DATA:  Weakness, shortness of breath. Altered mental status. Pneumonia, complication suspected, xray done Respiratory illness, nondiagnostic xray EXAM: CT CHEST WITH CONTRAST TECHNIQUE: Multidetector CT imaging of the chest was performed during intravenous contrast administration. RADIATION DOSE REDUCTION: This exam was performed according to the departmental dose-optimization program which includes automated exposure control, adjustment of the mA and/or kV according to patient size and/or use of iterative reconstruction technique. CONTRAST:  50mL ISOVUE-370 IOPAMIDOL  (ISOVUE-370) INJECTION 76% COMPARISON:  Chest x-ray today FINDINGS: Cardiovascular: Left chest wall pacer in place with leads in the right atrium and right ventricle. Cardiomegaly. Coronary artery and aortic atherosclerosis. No evidence of aortic aneurysm. No filling defects in the pulmonary arteries to suggest pulmonary emboli. Mediastinum/Nodes: No mediastinal, hilar, or axillary adenopathy. Trachea and esophagus are unremarkable. Thyroid unremarkable. Lungs/Pleura: Moderate right pleural effusion and small left pleural effusion. Compressive/dependent atelectasis in the lower lobes. Otherwise no confluent opacities. Upper Abdomen: No acute findings Musculoskeletal: Chest wall soft tissues are unremarkable. No acute bony abnormality. IMPRESSION: No evidence of pulmonary embolus. Coronary artery disease. Moderate right pleural effusion and small left pleural effusion with compressive/dependent atelectasis in the lower lobes. Aortic Atherosclerosis (ICD10-I70.0). Electronically Signed   By: Charlett Nose M.D.   On: 10/05/2023 12:58   CT Head Wo Contrast  Result Date: 10/05/2023 CLINICAL DATA:  Mental status change, unknown cause EXAM: CT HEAD WITHOUT CONTRAST TECHNIQUE: Contiguous axial images were obtained from the base of the skull through the vertex without intravenous contrast. RADIATION DOSE REDUCTION: This exam was performed according to the departmental dose-optimization program which includes automated exposure control, adjustment of the mA and/or kV according to patient size and/or use of iterative reconstruction technique. COMPARISON:  None Available. FINDINGS: Brain: There is atrophy and chronic small vessel disease changes. No acute intracranial abnormality. Specifically, no hemorrhage, hydrocephalus, mass lesion, acute infarction, or significant intracranial injury. Vascular: No hyperdense vessel or unexpected calcification. Skull: No acute calvarial abnormality. Sinuses/Orbits: No acute findings  Other: None IMPRESSION: Atrophy, chronic microvascular disease. No acute intracranial abnormality. Electronically Signed   By: Charlett Nose M.D.   On: 10/05/2023 12:55   DG Chest 2 View  Result Date: 10/05/2023 CLINICAL DATA:  89 year old male with weakness, hypertension, shortness of breath. EXAM: CHEST - 2 VIEW COMPARISON:  Chest radiographs 07/18/2021 and earlier. FINDINGS: AP and lateral views 0859 hours. Lower lung volumes. Cardiomegaly appears progressed. Chronic left chest dual lead pacemaker. New bilateral pleural effusions, small to moderate  and greater on the right. Patchy superimposed lung base opacity. Increased pulmonary vascularity elsewhere. No pneumothorax. No definite air bronchograms. No acute osseous abnormality identified. 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 Negative visible bowel gas. IMPRESSION: Cardiomegaly with constellation of abnormal lung opacity most suggestive of pulmonary interstitial edema, pleural effusions (small to moderate). Infection felt less likely. Electronically Signed   By: Odessa Fleming M.D.   On: 10/05/2023 09:06    Pending Labs Unresulted Labs (From admission, onward)     Start     Ordered   10/06/23 0500  Basic metabolic panel  Tomorrow morning,   R        10/05/23 1547   10/05/23 1546  D-dimer, quantitative  Add-on,   AD        10/05/23 1547   10/05/23 1543  Brain natriuretic peptide  (Heart Failure)  Once,   R        10/05/23 1547            Vitals/Pain Today's Vitals   10/05/23 1600 10/05/23 1615 10/05/23 1630 10/05/23 1640  BP: (!) 142/85 133/75 122/86   Pulse: 80 (!) 56 67   Resp: (!) 32 (!) 25 (!) 23   Temp:    97.9 F (36.6 C)  TempSrc:    Oral  SpO2: 96% 94% 97%   Weight:      Height:      PainSc:        Isolation Precautions No active isolations  Medications Medications  verapamil (CALAN-SR) CR tablet 240 mg (has no administration in time range)  famotidine (PEPCID) tablet 40  mg (has no administration in time range)  pantoprazole (PROTONIX) EC tablet 80 mg (has no administration in time range)  cyanocobalamin (VITAMIN B12) tablet 1,000 mcg (1,000 mcg Oral Given 10/05/23 1533)  furosemide (LASIX) injection 40 mg (40 mg Intravenous Given 10/05/23 1634)  heparin injection 5,000 Units (5,000 Units Subcutaneous Given 10/05/23 1634)  sodium chloride flush (NS) 0.9 % injection 3 mL (has no administration in time range)  sodium chloride flush (NS) 0.9 % injection 3 mL (has no administration in time range)  0.9 %  sodium chloride infusion (has no administration in time range)  acetaminophen (TYLENOL) tablet 650 mg (has no administration in time range)    Or  acetaminophen (TYLENOL) suppository 650 mg (has no administration in time range)  traZODone (DESYREL) tablet 25 mg (has no administration in time range)  senna (SENOKOT) tablet 8.6 mg (has no administration in time range)  iopamidol (ISOVUE-370) 76 % injection 50 mL (50 mLs Intravenous Contrast Given 10/05/23 1106)    Mobility walks     Focused Assessments Pulmonary Assessment Handoff:  Lung sounds: Bilateral Breath Sounds: Diminished O2 Device: Room Air      R Recommendations: See Admitting Provider Note  Report given to:   Additional Notes: Uses urinal appropriately. Occasional auditory hallucinations. No psych hx

## 2023-10-05 NOTE — Assessment & Plan Note (Signed)
Will continue CPAP using home settings

## 2023-10-05 NOTE — ED Notes (Signed)
Admitting MD at bedside.

## 2023-10-05 NOTE — ED Triage Notes (Signed)
Pt coming in complaining of having trouble sleeping. Pt reports having high blood pressure. Pt reports he was unable to sleep on Saturday night . Pt reports having anxiety

## 2023-10-05 NOTE — ED Notes (Signed)
Pt able to ambulate. 02 sat = 91-93% while walking, RR=35-40 while walking. Pt short of breath after walking a short distance.

## 2023-10-05 NOTE — ED Notes (Signed)
Public affairs consultant. Report to Dr. Deretha Emory.

## 2023-10-05 NOTE — ED Notes (Signed)
To CT scan

## 2023-10-05 NOTE — ED Triage Notes (Signed)
Pt  has a Environmental education officer.

## 2023-10-05 NOTE — Progress Notes (Unsigned)
{  Select_TRH_Note:[id number]} 

## 2023-10-05 NOTE — Assessment & Plan Note (Signed)
Patient with DOE while maintaining oxygen saturation. He reports onset several days ago. Denies, chest pain, respiratory illness, trauma. Denies prior h/o pleural effusion. He has been maintained on eliquis for a fib. He had recent procedure with replacement of implantable pacemaker generator and was off Eliquis for several days. CT chest with contrast negative for PE. Degree of heart failure inconsistent with moderate pleural effusion but possible.  Plan IV lasix  If no improvement in pleural effusion may come to diagnostic and therapeutic thoracentesis: transudate vs malignant effusion vs other

## 2023-10-06 ENCOUNTER — Inpatient Hospital Stay (HOSPITAL_COMMUNITY): Payer: Medicare HMO

## 2023-10-06 DIAGNOSIS — I4821 Permanent atrial fibrillation: Secondary | ICD-10-CM

## 2023-10-06 DIAGNOSIS — I5031 Acute diastolic (congestive) heart failure: Secondary | ICD-10-CM | POA: Diagnosis not present

## 2023-10-06 LAB — ECHOCARDIOGRAM COMPLETE
Area-P 1/2: 3.87 cm2
Height: 70 in
S' Lateral: 4.1 cm
Weight: 2398.6 [oz_av]

## 2023-10-06 LAB — BASIC METABOLIC PANEL
Anion gap: 9 (ref 5–15)
BUN: 29 mg/dL — ABNORMAL HIGH (ref 8–23)
CO2: 26 mmol/L (ref 22–32)
Calcium: 8.6 mg/dL — ABNORMAL LOW (ref 8.9–10.3)
Chloride: 104 mmol/L (ref 98–111)
Creatinine, Ser: 1.25 mg/dL — ABNORMAL HIGH (ref 0.61–1.24)
GFR, Estimated: 54 mL/min — ABNORMAL LOW (ref >60)
Glucose, Bld: 97 mg/dL (ref 70–99)
Potassium: 3 mmol/L — ABNORMAL LOW (ref 3.5–5.1)
Sodium: 139 mmol/L (ref 135–145)

## 2023-10-06 MED ORDER — FUROSEMIDE 10 MG/ML IJ SOLN
40.0000 mg | Freq: Every day | INTRAMUSCULAR | Status: DC
  Administered 2023-10-06 – 2023-10-07 (×2): 40 mg via INTRAVENOUS
  Filled 2023-10-06 (×2): qty 4

## 2023-10-06 MED ORDER — ORAL CARE MOUTH RINSE: 15.0000 mL | OROMUCOSAL | Status: DC | PRN

## 2023-10-06 MED ORDER — POTASSIUM CHLORIDE CRYS ER 20 MEQ PO TBCR
40.0000 meq | EXTENDED_RELEASE_TABLET | Freq: Two times a day (BID) | ORAL | Status: AC
  Administered 2023-10-06 (×2): 40 meq via ORAL
  Filled 2023-10-06: qty 4
  Filled 2023-10-06: qty 2

## 2023-10-06 MED ORDER — HALOPERIDOL LACTATE 5 MG/ML IJ SOLN
1.0000 mg | Freq: Four times a day (QID) | INTRAMUSCULAR | Status: DC | PRN
  Administered 2023-10-06: 1 mg via INTRAVENOUS
  Filled 2023-10-06: qty 1

## 2023-10-06 MED ORDER — APIXABAN 5 MG PO TABS
5.0000 mg | ORAL_TABLET | Freq: Two times a day (BID) | ORAL | Status: DC
  Administered 2023-10-06 – 2023-10-08 (×5): 5 mg via ORAL
  Filled 2023-10-06 (×5): qty 1

## 2023-10-06 NOTE — Progress Notes (Signed)
Pt. With new orders from MD for Haldol and restraints. Haldol effective and restraint order not needed at this time. Restraints never applied.  MD notified to discontinue restraint order.

## 2023-10-06 NOTE — Plan of Care (Signed)
  Problem: Education: Goal: Ability to verbalize understanding of medication therapies will improve Outcome: Progressing   Problem: Activity: Goal: Capacity to carry out activities will improve Outcome: Progressing   Problem: Cardiac: Goal: Ability to achieve and maintain adequate cardiopulmonary perfusion will improve Outcome: Progressing   Problem: Education: Goal: Knowledge of General Education information will improve Description: Including pain rating scale, medication(s)/side effects and non-pharmacologic comfort measures Outcome: Progressing   Problem: Health Behavior/Discharge Planning: Goal: Ability to manage health-related needs will improve Outcome: Progressing   Problem: Clinical Measurements: Goal: Ability to maintain clinical measurements within normal limits will improve Outcome: Progressing Goal: Diagnostic test results will improve Outcome: Progressing Goal: Respiratory complications will improve Outcome: Progressing Goal: Cardiovascular complication will be avoided Outcome: Progressing   Problem: Activity: Goal: Risk for activity intolerance will decrease Outcome: Progressing   Problem: Coping: Goal: Level of anxiety will decrease Outcome: Progressing   Problem: Safety: Goal: Ability to remain free from injury will improve Outcome: Progressing   Problem: Skin Integrity: Goal: Risk for impaired skin integrity will decrease Outcome: Progressing   Problem: Self-Concept: Goal: Level of anxiety will decrease Outcome: Progressing

## 2023-10-06 NOTE — Progress Notes (Signed)
Heart Failure Navigator Progress Note  Assessed for Heart & Vascular TOC clinic readiness.  Patient does not meet criteria due to EF 60-65%, [facility] follow up recommended per Dr. Jomarie Longs. No HF TOC..   Navigator will sign off at this time.   Rhae Hammock, BSN, Scientist, clinical (histocompatibility and immunogenetics) Only

## 2023-10-06 NOTE — Progress Notes (Signed)
   10/06/23 0631  Provider Notification  Provider Name/Title Dr. Doree Fudge  Date Provider Notified 10/06/23  Time Provider Notified [phone]  Notification Reason Other (Comment) (restraints not needed. ? D/c)  Provider response See new orders  Date of Provider Response 10/06/23  Time of Provider Response [phone]

## 2023-10-06 NOTE — Plan of Care (Signed)
Pt is alert and oriented x 2-3. Pt periods of forgetfulness of place and time. Pt given trazodone at hs for sleep; Pt reported has had trouble sleeping. During overnight pt removed primafit, 4 condom catheters. Refused to have replaced. 2 linen changes Urgency Incontinence of urine. Unable to accurately measure output from Lasix given due to pt noncompliance to call or allow staff to assist to BR. Pt increasing agitation this am. Getting up without calling for staff and not following direction to allow staff to assist in ambulation due to unsteady gait. Argumentative, safety risk to himself and not redirectable. Pt paranoid about staff not informing him of the correct time. Pt accused staff of being bossy. Pt informed of protocol regarding fall precaution. Due to unsteady gait staff the to have contact guard. Pt not accepting. High risk of falling fall mat on floor. Haldol given per MD order. Restraints with held at this time attempting least restrictive.  Problem: Self-Concept: Goal: Level of anxiety will decrease Outcome: Not Progressing   Problem: Education: Goal: Ability to demonstrate management of disease process will improve 10/06/2023 0412 by Moishe Spice, RN Outcome: Progressing 10/06/2023 0411 by Moishe Spice, RN Outcome: Progressing Goal: Ability to verbalize understanding of medication therapies will improve 10/06/2023 0412 by Moishe Spice, RN Outcome: Progressing 10/06/2023 0411 by Moishe Spice, RN Outcome: Progressing   Problem: Activity: Goal: Capacity to carry out activities will improve 10/06/2023 0412 by Moishe Spice, RN Outcome: Progressing 10/06/2023 0411 by Moishe Spice, RN Outcome: Progressing   Problem: Cardiac: Goal: Ability to achieve and maintain adequate cardiopulmonary perfusion will improve 10/06/2023 0412 by Moishe Spice, RN Outcome: Progressing 10/06/2023 0411 by Moishe Spice, RN Outcome: Progressing   Problem:  Education: Goal: Knowledge of General Education information will improve Description: Including pain rating scale, medication(s)/side effects and non-pharmacologic comfort measures 10/06/2023 0412 by Moishe Spice, RN Outcome: Progressing 10/06/2023 0411 by Moishe Spice, RN Outcome: Progressing   Problem: Health Behavior/Discharge Planning: Goal: Ability to manage health-related needs will improve 10/06/2023 0412 by Moishe Spice, RN Outcome: Progressing 10/06/2023 0411 by Moishe Spice, RN Outcome: Progressing   Problem: Clinical Measurements: Goal: Ability to maintain clinical measurements within normal limits will improve 10/06/2023 0412 by Moishe Spice, RN Outcome: Progressing 10/06/2023 0411 by Moishe Spice, RN Outcome: Progressing Goal: Will remain free from infection 10/06/2023 0412 by Moishe Spice, RN Outcome: Progressing 10/06/2023 0411 by Moishe Spice, RN Outcome: Progressing Goal: Diagnostic test results will improve 10/06/2023 0412 by Moishe Spice, RN Outcome: Progressing 10/06/2023 0411 by Moishe Spice, RN Outcome: Progressing Goal: Respiratory complications will improve Outcome: Progressing Goal: Cardiovascular complication will be avoided Outcome: Progressing   Problem: Activity: Goal: Risk for activity intolerance will decrease 10/06/2023 0412 by Moishe Spice, RN Outcome: Progressing 10/06/2023 0411 by Moishe Spice, RN Outcome: Progressing   Problem: Nutrition: Goal: Adequate nutrition will be maintained 10/06/2023 0412 by Moishe Spice, RN Outcome: Progressing 10/06/2023 0411 by Moishe Spice, RN Outcome: Progressing   Problem: Coping: Goal: Level of anxiety will decrease 10/06/2023 0412 by Moishe Spice, RN Outcome: Progressing 10/06/2023 0411 by Moishe Spice, RN Outcome: Progressing   Problem: Elimination: Goal: Will not experience complications related to bowel motility 10/06/2023  0412 by Moishe Spice, RN Outcome: Progressing 10/06/2023 0411 by Moishe Spice, RN Outcome: Progressing Goal: Will not experience complications related to urinary retention 10/06/2023 0412 by Moishe Spice, RN  Outcome: Progressing 10/06/2023 0411 by Moishe Spice, RN Outcome: Progressing   Problem: Pain Management: Goal: General experience of comfort will improve 10/06/2023 0412 by Moishe Spice, RN Outcome: Progressing 10/06/2023 0411 by Moishe Spice, RN Outcome: Progressing   Problem: Safety: Goal: Ability to remain free from injury will improve 10/06/2023 0412 by Moishe Spice, RN Outcome: Progressing 10/06/2023 0411 by Moishe Spice, RN Outcome: Progressing   Problem: Skin Integrity: Goal: Risk for impaired skin integrity will decrease 10/06/2023 0412 by Moishe Spice, RN Outcome: Progressing 10/06/2023 0411 by Moishe Spice, RN Outcome: Progressing

## 2023-10-06 NOTE — Progress Notes (Signed)
PROGRESS NOTE    Eric Lambert  YQM:[id number] DOB: 1929/11/06 DOA: 10/05/2023 PCP: [name], MD  89/M with history of tachybradycardia syndrome with PPM, permanent A-fib history of OSA, remote bacterial endocarditis presented to the ED with weakness and shortness of breath, in the ER labs noted sodium 136, creatinine 1.3, troponin 33, hemoglobin 13.4, chest x-ray with interstitial edema and small to moderate pleural effusions, CT chest with moderate right pleural effusion and small left, admitted started on diuretics, cardiology consulted   Subjective: -Feels better, breathing is improving  Assessment and Plan:  Acute on chronic diastolic CHF -Prior echo 11/22 with preserved EF -Repeat echo today with EF 60-65%, normal RV, moderately enlarged, moderate MR -He is 1.6 L negative, continue IV Lasix 1 more day -Conservative management on account of advanced age and frailty -Increase activity, PT eval  Pleural effusion -secondary to above  Hypokalemia Replace  GERD -Continue PPI  History of tachybradycardia syndrome, PPM Permanent atrial fibrillation (HCC) -Continue verapamil, Eliquis  Essential hypertension -Stable continue verapamil  Obstructive sleep apnea -Resume CPAP  DVT prophylaxis: Eliquis Code Status: Full code per patient request Family Communication: None present Disposition Plan: Home tomorrow if stable  Consultants:    Procedures:   Antimicrobials:    Objective: Vitals:   10/06/23 0038 10/06/23 0153 10/06/23 0352 10/06/23 0744  BP: 131/81  114/78 (!) 134/94  Pulse: 73  81 95  Resp:   19 18  Temp: 99 F (37.2 C)  98.8 F (37.1 C)   TempSrc: Oral  Oral   SpO2: 95%  96% 96%  Weight:  68 kg    Height:        Intake/Output Summary (Last 24 hours) at 10/06/2023 0944 Last data filed at 10/06/2023 0900 Gross per 24 hour  Intake 480 ml  Output 1875 ml  Net -1395 ml   Filed Weights   10/05/23 0834 10/05/23 2157 10/06/23 0153   Weight: 68 kg 68 kg 68 kg    Examination:  General exam: Elderly thinly built male sitting up in bed, AAOx3 HEENT: No JVD CVS: S1-S2, irregular rhythm Lungs: Decreased at the bases otherwise clear Abdomen: Soft, nontender, bowel sounds present Extremities: No edema Skin: No rashes Psychiatry:  Mood & affect appropriate.     Data Reviewed:   CBC: Recent Labs  Lab 10/05/23 0924  WBC 8.0  HGB 13.4  HCT 40.7  MCV 89.8  PLT 130*   Basic Metabolic Panel: Recent Labs  Lab 10/05/23 0924 10/06/23 0431  NA 136 139  K 4.0 3.0*  CL 106 104  CO2 22 26  GLUCOSE 120* 97  BUN 39* 29*  CREATININE 1.31* 1.25*  CALCIUM 9.1 8.6*   GFR: Estimated Creatinine Clearance: 36.3 mL/min (A) (by C-G formula based on SCr of 1.25 mg/dL (H)). Liver Function Tests: No results for input(s): "AST", "ALT", "ALKPHOS", "BILITOT", "PROT", "ALBUMIN" in the last 168 hours. No results for input(s): "LIPASE", "AMYLASE" in the last 168 hours. No results for input(s): "AMMONIA" in the last 168 hours. Coagulation Profile: No results for input(s): "INR", "PROTIME" in the last 168 hours. Cardiac Enzymes: No results for input(s): "CKTOTAL", "CKMB", "CKMBINDEX", "TROPONINI" in the last 168 hours. BNP (last 3 results) No results for input(s): "PROBNP" in the last 8760 hours. HbA1C: No results for input(s): "HGBA1C" in the last 72 hours. CBG: No results for input(s): "GLUCAP" in the last 168 hours. Lipid Profile: No results for input(s): "CHOL", "HDL", "LDLCALC", "TRIG", "CHOLHDL", "LDLDIRECT" in the last 72  hours. Thyroid Function Tests: No results for input(s): "TSH", "T4TOTAL", "FREET4", "T3FREE", "THYROIDAB" in the last 72 hours. Anemia Panel: No results for input(s): "VITAMINB12", "FOLATE", "FERRITIN", "TIBC", "IRON", "RETICCTPCT" in the last 72 hours. Urine analysis:    Component Value Date/Time   COLORURINE YELLOW 10/05/2023 0846   APPEARANCEUR CLEAR 10/05/2023 0846   LABSPEC >1.046 (H)  10/05/2023 0846   PHURINE 5.0 10/05/2023 0846   GLUCOSEU NEGATIVE 10/05/2023 0846   HGBUR NEGATIVE 10/05/2023 0846   BILIRUBINUR NEGATIVE 10/05/2023 0846   BILIRUBINUR positive 1+ 07/18/2021 1424   KETONESUR 5 (A) 10/05/2023 0846   PROTEINUR 30 (A) 10/05/2023 0846   UROBILINOGEN 1.0 07/18/2021 1424   UROBILINOGEN 0.2 09/28/2007 1424   NITRITE NEGATIVE 10/05/2023 0846   LEUKOCYTESUR NEGATIVE 10/05/2023 0846   Sepsis Labs: @LABRCNTIP (procalcitonin:4,lacticidven:4)  )No results found for this or any previous visit (from the past 240 hour(s)).   Radiology Studies: ECHOCARDIOGRAM COMPLETE  Result Date: 10/06/2023    ECHOCARDIOGRAM REPORT   Patient Name:   Eric Lambert Date of Exam: 10/06/2023 Medical Rec #:  [id number]              Height:       70.0 in Accession #:    [id number]             Weight:       149.9 lb Date of Birth:  23-Nov-1929              BSA:          1.847 m Patient Age:    89 years               BP:           134/94 mmHg Patient Gender: M                      HR:           94 bpm. Exam Location:  Inpatient Procedure: 2D Echo, Cardiac Doppler and Color Doppler Indications:    CHF - Acute Diastolic I50.31  History:        Patient has prior history of Echocardiogram examinations, most                 recent 03/04/2021. CHF; Arrythmias:Atrial Fibrillation.  Sonographer:    Harriette Bouillon RDCS Referring Phys: [id number] MICHAEL E NORINS IMPRESSIONS  1. Left ventricular ejection fraction, by estimation, is 60 to 65%. The left ventricle has normal function. The left ventricle has no regional wall motion abnormalities. The left ventricular internal cavity size was mildly dilated. Left ventricular diastolic function could not be evaluated.  2. Right ventricular systolic function is normal. The right ventricular size is moderately enlarged. There is mildly elevated pulmonary artery systolic pressure. The estimated right ventricular systolic pressure is 36.2 mmHg.  3. Left atrial size was  massively dilated.  4. Right atrial size was severely dilated.  5. The mitral valve is degenerative. Moderate mitral valve regurgitation. No evidence of mitral stenosis. There is mild late systolic prolapse of the middle scallop of the posterior leaflet of the mitral valve.  6. The aortic valve is tricuspid. There is moderate calcification of the aortic valve. There is moderate thickening of the aortic valve. Aortic valve regurgitation is not visualized. Aortic valve sclerosis/calcification is present, without any evidence of aortic stenosis.  7. The inferior vena cava is normal in size with greater than 50% respiratory variability, suggesting right atrial pressure of 3 mmHg. FINDINGS  Left Ventricle: Left ventricular ejection fraction, by estimation, is 60 to 65%. The left ventricle has normal function. The left ventricle has no regional wall motion abnormalities. The left ventricular internal cavity size was mildly dilated. There is  no left ventricular hypertrophy. Left ventricular diastolic function could not be evaluated due to atrial fibrillation. Left ventricular diastolic function could not be evaluated. Right Ventricle: The right ventricular size is moderately enlarged. No increase in right ventricular wall thickness. Right ventricular systolic function is normal. There is mildly elevated pulmonary artery systolic pressure. The tricuspid regurgitant velocity is 2.88 m/s, and with an assumed right atrial pressure of 3 mmHg, the estimated right ventricular systolic pressure is 36.2 mmHg. Left Atrium: Left atrial size was massively dilated. Right Atrium: Right atrial size was severely dilated. Pericardium: Trivial pericardial effusion is present. The pericardial effusion is circumferential. Mitral Valve: The mitral valve is degenerative in appearance. There is mild late systolic prolapse of the middle scallop of the posterior leaflet of the mitral valve. There is mild thickening of the mitral valve leaflet(s).  There is mild calcification of  the mitral valve leaflet(s). Mild mitral annular calcification. Moderate mitral valve regurgitation, with eccentric anteriorly directed jet. No evidence of mitral valve stenosis. Tricuspid Valve: The tricuspid valve is normal in structure. Tricuspid valve regurgitation is not demonstrated. No evidence of tricuspid stenosis. Aortic Valve: The aortic valve is tricuspid. There is moderate calcification of the aortic valve. There is moderate thickening of the aortic valve. Aortic valve regurgitation is not visualized. Aortic valve sclerosis/calcification is present, without any  evidence of aortic stenosis. Pulmonic Valve: The pulmonic valve was normal in structure. Pulmonic valve regurgitation is not visualized. No evidence of pulmonic stenosis. Aorta: The aortic root is normal in size and structure. Venous: The inferior vena cava is normal in size with greater than 50% respiratory variability, suggesting right atrial pressure of 3 mmHg. IAS/Shunts: No atrial level shunt detected by color flow Doppler. Additional Comments: A device lead is visualized.  LEFT VENTRICLE PLAX 2D LVIDd:         5.90 cm LVIDs:         4.10 cm LV PW:         1.00 cm LV IVS:        1.00 cm LVOT diam:     2.00 cm LV SV:         41 LV SV Index:   22 LVOT Area:     3.14 cm  RIGHT VENTRICLE             IVC RV S prime:     12.00 cm/s  IVC diam: 1.00 cm TAPSE (M-mode): 2.3 cm LEFT ATRIUM            Index        RIGHT ATRIUM           Index LA diam:      5.40 cm  2.92 cm/m   RA Area:     24.40 cm LA Vol (A2C): 121.0 ml 65.52 ml/m  RA Volume:   80.50 ml  43.59 ml/m LA Vol (A4C): 151.0 ml 81.77 ml/m  AORTIC VALVE LVOT Vmax:   83.90 cm/s LVOT Vmean:  51.600 cm/s LVOT VTI:    0.132 m  AORTA Ao Root diam: 3.10 cm Ao Asc diam:  2.90 cm MITRAL VALVE                TRICUSPID VALVE MV Area (PHT): 3.87 cm  TR Peak grad:   33.2 mmHg MV Decel Time: 196 msec     TR Vmax:        288.00 cm/s MV E velocity: 135.00 cm/s                              SHUNTS                             Systemic VTI:  0.13 m                             Systemic Diam: 2.00 cm Armanda Magic MD Electronically signed by Armanda Magic MD Signature Date/Time: 10/06/2023/8:56:08 AM    Final    CT Chest W Contrast  Result Date: 10/05/2023 CLINICAL DATA:  Weakness, shortness of breath. Altered mental status. Pneumonia, complication suspected, xray done Respiratory illness, nondiagnostic xray EXAM: CT CHEST WITH CONTRAST TECHNIQUE: Multidetector CT imaging of the chest was performed during intravenous contrast administration. RADIATION DOSE REDUCTION: This exam was performed according to the departmental dose-optimization program which includes automated exposure control, adjustment of the mA and/or kV according to patient size and/or use of iterative reconstruction technique. CONTRAST:  50mL ISOVUE-370 IOPAMIDOL (ISOVUE-370) INJECTION 76% COMPARISON:  Chest x-ray today FINDINGS: Cardiovascular: Left chest wall pacer in place with leads in the right atrium and right ventricle. Cardiomegaly. Coronary artery and aortic atherosclerosis. No evidence of aortic aneurysm. No filling defects in the pulmonary arteries to suggest pulmonary emboli. Mediastinum/Nodes: No mediastinal, hilar, or axillary adenopathy. Trachea and esophagus are unremarkable. Thyroid unremarkable. Lungs/Pleura: Moderate right pleural effusion and small left pleural effusion. Compressive/dependent atelectasis in the lower lobes. Otherwise no confluent opacities. Upper Abdomen: No acute findings Musculoskeletal: Chest wall soft tissues are unremarkable. No acute bony abnormality. IMPRESSION: No evidence of pulmonary embolus. Coronary artery disease. Moderate right pleural effusion and small left pleural effusion with compressive/dependent atelectasis in the lower lobes. Aortic Atherosclerosis (ICD10-I70.0). Electronically Signed   By: Charlett Nose M.D.   On: 10/05/2023 12:58   CT Head Wo  Contrast  Result Date: 10/05/2023 CLINICAL DATA:  Mental status change, unknown cause EXAM: CT HEAD WITHOUT CONTRAST TECHNIQUE: Contiguous axial images were obtained from the base of the skull through the vertex without intravenous contrast. RADIATION DOSE REDUCTION: This exam was performed according to the departmental dose-optimization program which includes automated exposure control, adjustment of the mA and/or kV according to patient size and/or use of iterative reconstruction technique. COMPARISON:  None Available. FINDINGS: Brain: There is atrophy and chronic small vessel disease changes. No acute intracranial abnormality. Specifically, no hemorrhage, hydrocephalus, mass lesion, acute infarction, or significant intracranial injury. Vascular: No hyperdense vessel or unexpected calcification. Skull: No acute calvarial abnormality. Sinuses/Orbits: No acute findings Other: None IMPRESSION: Atrophy, chronic microvascular disease. No acute intracranial abnormality. Electronically Signed   By: Charlett Nose M.D.   On: 10/05/2023 12:55   DG Chest 2 View  Result Date: 10/05/2023 CLINICAL DATA:  89 year old male with weakness, hypertension, shortness of breath. EXAM: CHEST - 2 VIEW COMPARISON:  Chest radiographs 07/18/2021 and earlier. FINDINGS: AP and lateral views 0859 hours. Lower lung volumes. Cardiomegaly appears progressed. Chronic left chest dual lead pacemaker. New bilateral pleural effusions, small to moderate and greater on the right. Patchy superimposed lung base opacity. Increased pulmonary vascularity elsewhere. No pneumothorax. No definite air bronchograms. No acute osseous  abnormality identified. 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 Negative visible bowel gas. IMPRESSION: Cardiomegaly with constellation of abnormal lung opacity most suggestive of pulmonary interstitial edema, pleural effusions (small to moderate). Infection felt less likely.  Electronically Signed   By: Odessa Fleming M.D.   On: 10/05/2023 09:06     Scheduled Meds:  vitamin B-12  1,000 mcg Oral Daily   famotidine  40 mg Oral QHS   furosemide  40 mg Intravenous Daily   heparin  5,000 Units Subcutaneous Q8H   pantoprazole  80 mg Oral Daily   potassium chloride  40 mEq Oral BID   senna  1 tablet Oral BID   verapamil  240 mg Oral Daily   Continuous Infusions:   LOS: 1 day    Time spent:    Zannie Cove, MD [facility]   10/06/2023, 9:44 AM

## 2023-10-06 NOTE — Progress Notes (Addendum)
Pt was agitated with care. Not calling for assistance to bathroom after educated regarding fall risk. Repeatedly removing condom catheter. Refused for it to be reapplied. Pt refusing for staff to help with ambulating to Bathroom. After pt informed why pt remained argumentative and agitated. Not redirectable. Impulsive behavior. Haldol 1mg  given per order. Restraints order but will attempt Haldol to see if effective at reducing agitation. Charge RN advised to wait on restraint application.

## 2023-10-06 NOTE — Plan of Care (Signed)
Patient remains on MC-3E at time of writing. The patient is AA+Ox4; no evidence of the previously documented altered mental status at this time. The RN provided the patient with an Facilities manager and provided coaching on its use; return demonstration of the skill is given by the patient. No supplemental O2 requirement at this time.   Problem: Education: Goal: Ability to demonstrate management of disease process will improve Outcome: Progressing Goal: Ability to verbalize understanding of medication therapies will improve Outcome: Progressing Goal: Individualized Educational Video(s) Outcome: Progressing   Problem: Activity: Goal: Capacity to carry out activities will improve Outcome: Progressing   Problem: Cardiac: Goal: Ability to achieve and maintain adequate cardiopulmonary perfusion will improve Outcome: Progressing   Problem: Education: Goal: Knowledge of General Education information will improve Description: Including pain rating scale, medication(s)/side effects and non-pharmacologic comfort measures Outcome: Progressing   Problem: Health Behavior/Discharge Planning: Goal: Ability to manage health-related needs will improve Outcome: Progressing   Problem: Clinical Measurements: Goal: Ability to maintain clinical measurements within normal limits will improve Outcome: Progressing Goal: Will remain free from infection Outcome: Progressing Goal: Diagnostic test results will improve Outcome: Progressing Goal: Respiratory complications will improve Outcome: Progressing Goal: Cardiovascular complication will be avoided Outcome: Progressing   Problem: Activity: Goal: Risk for activity intolerance will decrease Outcome: Progressing   Problem: Nutrition: Goal: Adequate nutrition will be maintained Outcome: Progressing   Problem: Coping: Goal: Level of anxiety will decrease Outcome: Progressing   Problem: Elimination: Goal: Will not experience complications  related to bowel motility Outcome: Progressing Goal: Will not experience complications related to urinary retention Outcome: Progressing   Problem: Pain Management: Goal: General experience of comfort will improve Outcome: Progressing   Problem: Safety: Goal: Ability to remain free from injury will improve Outcome: Progressing   Problem: Skin Integrity: Goal: Risk for impaired skin integrity will decrease Outcome: Progressing   Problem: Safety: Goal: Non-violent Restraint(s) Outcome: Progressing   Problem: Self-Concept: Goal: Level of anxiety will decrease Outcome: Progressing

## 2023-10-06 NOTE — Progress Notes (Addendum)
DAILY PROGRESS NOTE   Patient Name: Eric Lambert Date of Encounter: 10/06/2023 Cardiologist: Sherryl Manges, MD  Chief Complaint   Breathing is better today  Patient Profile   Eric Lambert is a 89 y.o. male with a hx of tachybradycardia syndrome s/p PPM, permanent atrial fibrillation, OSA, bacterial endocarditis, GERD who is being seen 10/05/2023 for the evaluation of dyspnea on exertion/CHF at the request of Dr Deretha Emory.   Subjective   Net negative 1.6L yesterday. Echo today personally reviewed, LVEF 60-65% with moderately enlarged RV with normal systolic function, RVSP 36 mmHg, massive LAE and severe RAE, moderate MR, no AS. Creatinine improved at 1.25 today. Potassium 3.0. BNP 229.  Objective   Vitals:   10/06/23 0038 10/06/23 0153 10/06/23 0352 10/06/23 0744  BP: 131/81  114/78 (!) 134/94  Pulse: 73  81 95  Resp:   19 18  Temp: 99 F (37.2 C)  98.8 F (37.1 C)   TempSrc: Oral  Oral   SpO2: 95%  96% 96%  Weight:  68 kg    Height:        Intake/Output Summary (Last 24 hours) at 10/06/2023 0920 Last data filed at 10/06/2023 0900 Gross per 24 hour  Intake --  Output 1875 ml  Net -1875 ml   Filed Weights   10/05/23 0834 10/05/23 2157 10/06/23 0153  Weight: 68 kg 68 kg 68 kg    Physical Exam   General appearance: alert, appears stated age, and no distress Neck: JVD - 4 cm above sternal notch, no carotid bruit, and thyroid not enlarged, symmetric, no tenderness/mass/nodules Lungs: clear to auscultation bilaterally Heart: irregularly irregular rhythm Abdomen: soft, non-tender; bowel sounds normal; no masses,  no organomegaly Extremities: extremities normal, atraumatic, no cyanosis or edema Pulses: 2+ and symmetric Skin: Skin color, texture, turgor normal. No rashes or lesions Neurologic: Grossly normal Psych: Pleasant  Inpatient Medications    Scheduled Meds:  vitamin B-12  1,000 mcg Oral Daily   famotidine  40 mg Oral QHS    furosemide  40 mg Intravenous Daily   heparin  5,000 Units Subcutaneous Q8H   pantoprazole  80 mg Oral Daily   potassium chloride  40 mEq Oral BID   senna  1 tablet Oral BID   sodium chloride flush  3 mL Intravenous Q12H   verapamil  240 mg Oral Daily    Continuous Infusions:  sodium chloride      PRN Meds: sodium chloride, acetaminophen **OR** acetaminophen, haloperidol lactate, sodium chloride flush, traZODone   Labs   Results for orders placed or performed during the hospital encounter of 10/05/23 (from the past 48 hour(s))  Urinalysis, Routine w reflex microscopic -Urine, Clean Catch     Status: Abnormal   Collection Time: 10/05/23  8:46 AM  Result Value Ref Range   Color, Urine YELLOW YELLOW   APPearance CLEAR CLEAR   Specific Gravity, Urine >1.046 (H) 1.005 - 1.030   pH 5.0 5.0 - 8.0   Glucose, UA NEGATIVE NEGATIVE mg/dL   Hgb urine dipstick NEGATIVE NEGATIVE   Bilirubin Urine NEGATIVE NEGATIVE   Ketones, ur 5 (A) NEGATIVE mg/dL   Protein, ur 30 (A) NEGATIVE mg/dL   Nitrite NEGATIVE NEGATIVE   Leukocytes,Ua NEGATIVE NEGATIVE   RBC / HPF 0-5 0 - 5 RBC/hpf   WBC, UA 0-5 0 - 5 WBC/hpf   Bacteria, UA NONE SEEN NONE SEEN   Squamous Epithelial / HPF 0-5 0 - 5 /HPF   Mucus PRESENT  Comment: Performed at [facility], [address], [place], [place] [address]  Basic metabolic panel     Status: Abnormal   Collection Time: 10/05/23  9:24 AM  Result Value Ref Range   Sodium 136 135 - 145 mmol/L   Potassium 4.0 3.5 - 5.1 mmol/L   Chloride 106 98 - 111 mmol/L   CO2 22 22 - 32 mmol/L   Glucose, Bld 120 (H) 70 - 99 mg/dL    Comment: Glucose reference range applies only to samples taken after fasting for at least 8 hours.   BUN 39 (H) 8 - 23 mg/dL   Creatinine, Ser 4.40 (H) 0.61 - 1.24 mg/dL   Calcium 9.1 8.9 - 10.2 mg/dL   GFR, Estimated 51 (L) >60 mL/min    Comment: (NOTE) Calculated using the CKD-EPI Creatinine Equation (2021)    Anion gap 8 5 - 15     Comment: Performed at [facility], [address], [place], [place] [address]  CBC     Status: Abnormal   Collection Time: 10/05/23  9:24 AM  Result Value Ref Range   WBC 8.0 4.0 - 10.5 K/uL   RBC 4.53 4.22 - 5.81 MIL/uL   Hemoglobin 13.4 13.0 - 17.0 g/dL   HCT 64.4 03.4 - 74.2 %   MCV 89.8 80.0 - 100.0 fL   MCH 29.6 26.0 - 34.0 pg   MCHC 32.9 30.0 - 36.0 g/dL   RDW 59.5 63.8 - 75.6 %   Platelets 130 (L) 150 - 400 K/uL   nRBC 0.0 0.0 - 0.2 %    Comment: Performed at [facility], [address], [place], [place] [address]  Troponin I (High Sensitivity)     Status: Abnormal   Collection Time: 10/05/23  9:24 AM  Result Value Ref Range   Troponin I (High Sensitivity) 33 (H) <18 ng/L    Comment: (NOTE) Elevated high sensitivity troponin I (hsTnI) values and significant  changes across serial measurements may suggest ACS but many other  chronic and acute conditions are known to elevate hsTnI results.  Refer to the "Links" section for chest pain algorithms and additional  guidance. Performed at [facility], [address] [place], [place] [address]   Troponin I (High Sensitivity)     Status: Abnormal   Collection Time: 10/05/23 11:55 AM  Result Value Ref Range   Troponin I (High Sensitivity) 33 (H) <18 ng/L    Comment: (NOTE) Elevated high sensitivity troponin I (hsTnI) values and significant  changes across serial measurements may suggest ACS but many other  chronic and acute conditions are known to elevate hsTnI results.  Refer to the "Links" section for chest pain algorithms and additional  guidance. Performed at [facility], [address] [place], [place] [address]   Brain natriuretic peptide     Status: Abnormal   Collection Time: 10/05/23  4:33 PM  Result Value Ref Range   B Natriuretic Peptide 229.2 (H) 0.0 - 100.0 pg/mL    Comment: Performed at [facility], [address], [place], [place] [address]  D-dimer, quantitative      Status: Abnormal   Collection Time: 10/05/23  4:33 PM  Result Value Ref Range   D-Dimer, Quant 0.86 (H) 0.00 - 0.50 ug/mL-FEU    Comment: (NOTE) At the manufacturer cut-off value of 0.5 g/mL FEU, this assay has a negative predictive value of 95-100%.This assay is intended for use in conjunction  with a clinical pretest probability (PTP) assessment model to exclude pulmonary embolism (PE) and deep venous thrombosis (DVT) in outpatients suspected of PE or DVT. Results should be correlated with clinical presentation. Performed at [facility], [address], [place], [place] [address]   Basic metabolic panel     Status: Abnormal   Collection Time: 10/06/23  4:31 AM  Result Value Ref Range   Sodium 139 135 - 145 mmol/L   Potassium 3.0 (L) 3.5 - 5.1 mmol/L   Chloride 104 98 - 111 mmol/L   CO2 26 22 - 32 mmol/L   Glucose, Bld 97 70 - 99 mg/dL    Comment: Glucose reference range applies only to samples taken after fasting for at least 8 hours.   BUN 29 (H) 8 - 23 mg/dL   Creatinine, Ser 1.91 (H) 0.61 - 1.24 mg/dL   Calcium 8.6 (L) 8.9 - 10.3 mg/dL   GFR, Estimated 54 (L) >60 mL/min    Comment: (NOTE) Calculated using the CKD-EPI Creatinine Equation (2021)    Anion gap 9 5 - 15    Comment: Performed at [facility], [address], [place], [place] [address]    ECG   N/A  Telemetry   Afib with RVR at times - Personally Reviewed  Radiology    ECHOCARDIOGRAM COMPLETE  Result Date: 10/06/2023    ECHOCARDIOGRAM REPORT   Patient Name:   JAQUARIUS NOVEY Date of Exam: 10/06/2023 Medical Rec #:  [id number]              Height:       70.0 in Accession #:    [id number]             Weight:       149.9 lb Date of Birth:  [birth year]              BSA:          1.847 m Patient Age:    89 years               BP:           134/94 mmHg Patient Gender: M                      HR:           94 bpm. Exam Location:  Inpatient Procedure: 2D Echo, Cardiac Doppler and Color Doppler  Indications:    CHF - Acute Diastolic I50.31  History:        Patient has prior history of Echocardiogram examinations, most                 recent 03/04/2021. CHF; Arrythmias:Atrial Fibrillation.  Sonographer:    Harriette Bouillon RDCS Referring Phys: [id number] MICHAEL E NORINS IMPRESSIONS  1. Left ventricular ejection fraction, by estimation, is 60 to 65%. The left ventricle has normal function. The left ventricle has no regional wall motion abnormalities. The left ventricular internal cavity size was mildly dilated. Left ventricular diastolic function could not be evaluated.  2. Right ventricular systolic function is normal. The right ventricular size is moderately enlarged. There is mildly elevated pulmonary artery systolic pressure. The estimated right ventricular systolic pressure is 36.2 mmHg.  3. Left atrial size was massively dilated.  4. Right atrial size was severely dilated.  5. The mitral valve is degenerative. Moderate mitral valve regurgitation. No evidence of mitral stenosis. There is mild late systolic prolapse of the middle scallop  of the posterior leaflet of the mitral valve.  6. The aortic valve is tricuspid. There is moderate calcification of the aortic valve. There is moderate thickening of the aortic valve. Aortic valve regurgitation is not visualized. Aortic valve sclerosis/calcification is present, without any evidence of aortic stenosis.  7. The inferior vena cava is normal in size with greater than 50% respiratory variability, suggesting right atrial pressure of 3 mmHg. FINDINGS  Left Ventricle: Left ventricular ejection fraction, by estimation, is 60 to 65%. The left ventricle has normal function. The left ventricle has no regional wall motion abnormalities. The left ventricular internal cavity size was mildly dilated. There is  no left ventricular hypertrophy. Left ventricular diastolic function could not be evaluated due to atrial fibrillation. Left ventricular diastolic function could not be  evaluated. Right Ventricle: The right ventricular size is moderately enlarged. No increase in right ventricular wall thickness. Right ventricular systolic function is normal. There is mildly elevated pulmonary artery systolic pressure. The tricuspid regurgitant velocity is 2.88 m/s, and with an assumed right atrial pressure of 3 mmHg, the estimated right ventricular systolic pressure is 36.2 mmHg. Left Atrium: Left atrial size was massively dilated. Right Atrium: Right atrial size was severely dilated. Pericardium: Trivial pericardial effusion is present. The pericardial effusion is circumferential. Mitral Valve: The mitral valve is degenerative in appearance. There is mild late systolic prolapse of the middle scallop of the posterior leaflet of the mitral valve. There is mild thickening of the mitral valve leaflet(s). There is mild calcification of  the mitral valve leaflet(s). Mild mitral annular calcification. Moderate mitral valve regurgitation, with eccentric anteriorly directed jet. No evidence of mitral valve stenosis. Tricuspid Valve: The tricuspid valve is normal in structure. Tricuspid valve regurgitation is not demonstrated. No evidence of tricuspid stenosis. Aortic Valve: The aortic valve is tricuspid. There is moderate calcification of the aortic valve. There is moderate thickening of the aortic valve. Aortic valve regurgitation is not visualized. Aortic valve sclerosis/calcification is present, without any  evidence of aortic stenosis. Pulmonic Valve: The pulmonic valve was normal in structure. Pulmonic valve regurgitation is not visualized. No evidence of pulmonic stenosis. Aorta: The aortic root is normal in size and structure. Venous: The inferior vena cava is normal in size with greater than 50% respiratory variability, suggesting right atrial pressure of 3 mmHg. IAS/Shunts: No atrial level shunt detected by color flow Doppler. Additional Comments: A device lead is visualized.  LEFT VENTRICLE PLAX  2D LVIDd:         5.90 cm LVIDs:         4.10 cm LV PW:         1.00 cm LV IVS:        1.00 cm LVOT diam:     2.00 cm LV SV:         41 LV SV Index:   22 LVOT Area:     3.14 cm  RIGHT VENTRICLE             IVC RV S prime:     12.00 cm/s  IVC diam: 1.00 cm TAPSE (M-mode): 2.3 cm LEFT ATRIUM            Index        RIGHT ATRIUM           Index LA diam:      5.40 cm  2.92 cm/m   RA Area:     24.40 cm LA Vol (A2C): 121.0 ml 65.52 ml/m  RA Volume:  80.50 ml  43.59 ml/m LA Vol (A4C): 151.0 ml 81.77 ml/m  AORTIC VALVE LVOT Vmax:   83.90 cm/s LVOT Vmean:  51.600 cm/s LVOT VTI:    0.132 m  AORTA Ao Root diam: 3.10 cm Ao Asc diam:  2.90 cm MITRAL VALVE                TRICUSPID VALVE MV Area (PHT): 3.87 cm     TR Peak grad:   33.2 mmHg MV Decel Time: 196 msec     TR Vmax:        288.00 cm/s MV E velocity: 135.00 cm/s                             SHUNTS                             Systemic VTI:  0.13 m                             Systemic Diam: 2.00 cm Armanda Magic MD Electronically signed by Armanda Magic MD Signature Date/Time: 10/06/2023/8:56:08 AM    Final    CT Chest W Contrast  Result Date: 10/05/2023 CLINICAL DATA:  Weakness, shortness of breath. Altered mental status. Pneumonia, complication suspected, xray done Respiratory illness, nondiagnostic xray EXAM: CT CHEST WITH CONTRAST TECHNIQUE: Multidetector CT imaging of the chest was performed during intravenous contrast administration. RADIATION DOSE REDUCTION: This exam was performed according to the departmental dose-optimization program which includes automated exposure control, adjustment of the mA and/or kV according to patient size and/or use of iterative reconstruction technique. CONTRAST:  50mL ISOVUE-370 IOPAMIDOL (ISOVUE-370) INJECTION 76% COMPARISON:  Chest x-ray today FINDINGS: Cardiovascular: Left chest wall pacer in place with leads in the right atrium and right ventricle. Cardiomegaly. Coronary artery and aortic atherosclerosis. No evidence of  aortic aneurysm. No filling defects in the pulmonary arteries to suggest pulmonary emboli. Mediastinum/Nodes: No mediastinal, hilar, or axillary adenopathy. Trachea and esophagus are unremarkable. Thyroid unremarkable. Lungs/Pleura: Moderate right pleural effusion and small left pleural effusion. Compressive/dependent atelectasis in the lower lobes. Otherwise no confluent opacities. Upper Abdomen: No acute findings Musculoskeletal: Chest wall soft tissues are unremarkable. No acute bony abnormality. IMPRESSION: No evidence of pulmonary embolus. Coronary artery disease. Moderate right pleural effusion and small left pleural effusion with compressive/dependent atelectasis in the lower lobes. Aortic Atherosclerosis (ICD10-I70.0). Electronically Signed   By: Charlett Nose M.D.   On: 10/05/2023 12:58   CT Head Wo Contrast  Result Date: 10/05/2023 CLINICAL DATA:  Mental status change, unknown cause EXAM: CT HEAD WITHOUT CONTRAST TECHNIQUE: Contiguous axial images were obtained from the base of the skull through the vertex without intravenous contrast. RADIATION DOSE REDUCTION: This exam was performed according to the departmental dose-optimization program which includes automated exposure control, adjustment of the mA and/or kV according to patient size and/or use of iterative reconstruction technique. COMPARISON:  None Available. FINDINGS: Brain: There is atrophy and chronic small vessel disease changes. No acute intracranial abnormality. Specifically, no hemorrhage, hydrocephalus, mass lesion, acute infarction, or significant intracranial injury. Vascular: No hyperdense vessel or unexpected calcification. Skull: No acute calvarial abnormality. Sinuses/Orbits: No acute findings Other: None IMPRESSION: Atrophy, chronic microvascular disease. No acute intracranial abnormality. Electronically Signed   By: Charlett Nose M.D.   On: 10/05/2023 12:55   DG Chest 2 View  Result Date:  10/05/2023 CLINICAL DATA:  89 year old  male with weakness, hypertension, shortness of breath. EXAM: CHEST - 2 VIEW COMPARISON:  Chest radiographs 07/18/2021 and earlier. FINDINGS: AP and lateral views 0859 hours. Lower lung volumes. Cardiomegaly appears progressed. Chronic left chest dual lead pacemaker. New bilateral pleural effusions, small to moderate and greater on the right. Patchy superimposed lung base opacity. Increased pulmonary vascularity elsewhere. No pneumothorax. No definite air bronchograms. No acute osseous abnormality identified. 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 Negative visible bowel gas. IMPRESSION: Cardiomegaly with constellation of abnormal lung opacity most suggestive of pulmonary interstitial edema, pleural effusions (small to moderate). Infection felt less likely. Electronically Signed   By: Odessa Fleming M.D.   On: 10/05/2023 09:06    Cardiac Studies   See echo above  Assessment   Principal Problem:   (HFpEF) heart failure with preserved ejection fraction (HCC) Active Problems:   Obstructive sleep apnea   Essential hypertension   Permanent atrial fibrillation (HCC)   GERD   Pleural effusion   Plan   Good diuresis overnight- remains volume up. Continue today. Creatinine improved. Echo shows preserved LVEF with severe biatrial enlargement and moderate MR. Creatinine improving. Repeat chest xray ordered today to re-evaluate pleural effusions.  Time Spent Directly with Patient:  I have spent a total of 25 minutes with the patient reviewing hospital notes, telemetry, EKGs, labs and examining the patient as well as establishing an assessment and plan that was discussed personally with the patient.  > 50% of time was spent in direct patient care.  Length of Stay:  LOS: 1 day   Chrystie Nose, MD, [facility], FACP  [place]  [facility]  Medical Director of the [facility] Lipid [facility] &  Cardiovascular Risk Reduction Clinic Diplomate of the American Board  of Clinical Lipidology Attending Cardiologist  Direct Dial: [phone]  Fax: [phone]  Website:  [email or web address] Nicely Jarrah Seher 10/06/2023, 9:20 AM

## 2023-10-06 NOTE — Progress Notes (Signed)
Mobility Specialist Progress Note:    10/06/23 1545  Mobility  Activity Transferred from bed to chair  Level of Assistance Contact guard assist, steadying assist  Assistive Device Other (Comment) (HHA)  Distance Ambulated (ft) 5 ft  Activity Response Tolerated well  Mobility Referral Yes  $Mobility charge 1 Mobility  Mobility Specialist Start Time (ACUTE ONLY) 1450  Mobility Specialist Stop Time (ACUTE ONLY) 1500  Mobility Specialist Time Calculation (min) (ACUTE ONLY) 10 min   RN requested pt to sit up in chair. No physical assistance needed throughout session, contact guard for safety. No c/o throughout session. Situated in chair w/ call bell and personal belongings in reach. All needs met. Chair alarm is on.  Thompson Grayer Mobility Specialist  Please contact vis Secure Chat or  Rehab Office [phone]

## 2023-10-07 ENCOUNTER — Ambulatory Visit: Payer: Medicare HMO

## 2023-10-07 DIAGNOSIS — R410 Disorientation, unspecified: Secondary | ICD-10-CM

## 2023-10-07 DIAGNOSIS — I5031 Acute diastolic (congestive) heart failure: Secondary | ICD-10-CM | POA: Diagnosis not present

## 2023-10-07 DIAGNOSIS — I10 Essential (primary) hypertension: Secondary | ICD-10-CM

## 2023-10-07 DIAGNOSIS — N1832 Chronic kidney disease, stage 3b: Secondary | ICD-10-CM

## 2023-10-07 DIAGNOSIS — K219 Gastro-esophageal reflux disease without esophagitis: Secondary | ICD-10-CM

## 2023-10-07 DIAGNOSIS — N1831 Chronic kidney disease, stage 3a: Secondary | ICD-10-CM

## 2023-10-07 LAB — BASIC METABOLIC PANEL
Anion gap: 12 (ref 5–15)
BUN: 39 mg/dL — ABNORMAL HIGH (ref 8–23)
CO2: 26 mmol/L (ref 22–32)
Calcium: 8.8 mg/dL — ABNORMAL LOW (ref 8.9–10.3)
Chloride: 102 mmol/L (ref 98–111)
Creatinine, Ser: 1.41 mg/dL — ABNORMAL HIGH (ref 0.61–1.24)
GFR, Estimated: 47 mL/min — ABNORMAL LOW (ref >60)
Glucose, Bld: 103 mg/dL — ABNORMAL HIGH (ref 70–99)
Potassium: 3.7 mmol/L (ref 3.5–5.1)
Sodium: 140 mmol/L (ref 135–145)

## 2023-10-07 MED ORDER — FAMOTIDINE 20 MG PO TABS
20.0000 mg | ORAL_TABLET | Freq: Every day | ORAL | Status: DC
  Administered 2023-10-07: 20 mg via ORAL
  Filled 2023-10-07: qty 1

## 2023-10-07 MED ORDER — TRAZODONE HCL 50 MG PO TABS
50.0000 mg | ORAL_TABLET | Freq: Every day | ORAL | Status: DC
  Administered 2023-10-07: 50 mg via ORAL
  Filled 2023-10-07: qty 1

## 2023-10-07 NOTE — TOC Initial Note (Signed)
Transition of Care [facility]) - Initial/Assessment Note    Patient Details  Name: Eric Lambert MRN: [id number] Date of Birth: 1929-06-25  Transition of [facility]) CM/SW Contact:    Leone Haven, RN Phone Number: 10/07/2023, 12:47 PM  Clinical Narrative:                 From home with spouse, has PCP and insurance on file, states has no HH services in place at this time , has walker, cane, shower chair.  States wife will transport him home at [employer] and daughter , Tamela Oddi  support system, states gets medications from [facility] at [place].  Pta self ambulatory with cane for stability.   Expected Discharge Plan: Home/Self Care Barriers to Discharge: Continued Medical Work up   Patient Goals and CMS Choice Patient states their goals for this hospitalization and ongoing recovery are:: return home   Choice offered to / list presented to : NA      Expected Discharge Plan and Services In-house Referral: NA Discharge Planning Services: CM Consult Post Acute Care Choice: NA Living arrangements for the past 2 months: Single Family Home                 DME Arranged: N/A         HH Arranged: NA          Prior Living Arrangements/Services Living arrangements for the past 2 months: Single Family Home Lives with:: Spouse Patient language and need for interpreter reviewed:: Yes Do you feel safe going back to the place where you live?: Yes      Need for Family Participation in Patient Care: Yes (Comment) Care giver support system in place?: Yes (comment) Current home services: DME (walker, cane, shower chair) Criminal Activity/Legal Involvement Pertinent to Current Situation/Hospitalization: No - Comment as needed  Activities of Daily Living   ADL Screening (condition at time of admission) Independently performs ADLs?: Yes (appropriate for developmental age) Is the patient deaf or have difficulty hearing?: Yes Does the patient have difficulty seeing, even when wearing  glasses/contacts?: Yes Does the patient have difficulty concentrating, remembering, or making decisions?: Yes  Permission Sought/Granted Permission sought to share information with : Case Manager Permission granted to share information with : Yes, Verbal Permission Granted              Emotional Assessment Appearance:: Appears stated age Attitude/Demeanor/Rapport: Engaged Affect (typically observed): Appropriate Orientation: : Oriented to Self, Oriented to Place, Oriented to  Time, Oriented to Situation Alcohol / Substance Use: Not Applicable Psych Involvement: No (comment)  Admission diagnosis:  Dyspnea on exertion [R06.09] Pleural effusion [J90] (HFpEF) heart failure with preserved ejection fraction (HCC) [I50.30] Patient Active Problem List   Diagnosis Date Noted   Acute on chronic diastolic CHF (congestive heart failure) (HCC) 10/05/2023   Pleural effusion 10/05/2023   NSVT (nonsustained ventricular tachycardia) (HCC) 04/30/2023   Hyperglycemia 11/07/2022   S/P total left hip arthroplasty 01/07/2022   Esophageal obstruction due to food impaction    Aortic atherosclerosis (HCC) 08/13/2020   Degenerative disc disease, lumbar 07/18/2019   Chronic low back pain 06/09/2018   BPPV (benign paroxysmal positional vertigo) 12/10/2017   Right knee pain 06/02/2017   Erectile dysfunction 05/28/2016   Macular degeneration 05/28/2016   Right hip pain 08/04/2013   Bradycardia 02/02/2013   Thrombocytopenia (HCC) 08/04/2011   Secondary cardiomyopathy (HCC) 02/07/2009   PPM-[employer] 02/07/2009   Osteopenia 10/22/2007   Obstructive sleep apnea 10/21/2007   Essential hypertension  10/21/2007   SUBACUTE BACTERIAL ENDOCARDITIS 10/21/2007   Permanent atrial fibrillation (HCC) 10/21/2007   GERD 10/21/2007   BPH associated with nocturia 10/21/2007   PCP:  [name], MD Pharmacy:   [facility] [address] [place] - [address] [place] [address] Phone: [phone] Fax: [phone]     Social Determinants of Health (SDOH) Social History: SDOH Screenings   Food Insecurity: No Food Insecurity (10/05/2023)  Housing: Low Risk  (10/05/2023)  Transportation Needs: No Transportation Needs (10/05/2023)  Utilities: Not At Risk (10/05/2023)  Depression (PHQ2-9): Low Risk  (05/09/2022)  Tobacco Use: Low Risk  (10/05/2023)   SDOH Interventions:     Readmission Risk Interventions     No data to display

## 2023-10-07 NOTE — Assessment & Plan Note (Signed)
Hypokalemia  Renal function with serum cr at 1,41 with K at 3,7 and serum bicarbonate at 26. Na 140  Plan to hold on diuresis today and follow up renal function and electrolytes in am.

## 2023-10-07 NOTE — Hospital Course (Addendum)
Mr. Threats was admitted to the hospital with the working diagnosis of heart failure decompensation.   89/M with history of tachybradycardia syndrome with PPM, permanent A-fib history of OSA, remote bacterial endocarditis presented to the ED with weakness and shortness of breath, in the ER labs noted sodium 136, creatinine 1.3, troponin 33, hemoglobin 13.4, chest x-ray with interstitial edema and small to moderate pleural effusions, CT chest with moderate right pleural effusion and small left, admitted started on diuretics, cardiology consulted   Patient was placed on furosemide for diuresis.   11/13 volume status improving, but not yet back to baseline.

## 2023-10-07 NOTE — Progress Notes (Signed)
Mobility Specialist Progress Note:    10/07/23 1154  Mobility  Activity Ambulated with assistance in hallway  Level of Assistance Contact guard assist, steadying assist  Assistive Device None  Distance Ambulated (ft) 150 ft  Activity Response Tolerated well  Mobility Referral Yes  $Mobility charge 1 Mobility  Mobility Specialist Start Time (ACUTE ONLY) 1000  Mobility Specialist Stop Time (ACUTE ONLY) 1015  Mobility Specialist Time Calculation (min) (ACUTE ONLY) 15 min   Pt received in chair agreeable to mobility. No physical assistance needed during session, just contact guard for safety. Needed min verbal cues for posture, pt had difficult keeping back and knees straight. Returned to room w/o fault. Call bell and personal belongings in reach. All needs met w/ chair alarm on.   Thompson Grayer Mobility Specialist  Please contact vis Secure Chat or  Rehab Office [phone]

## 2023-10-07 NOTE — Progress Notes (Signed)
DAILY PROGRESS NOTE   Patient Name: Eric Lambert Date of Encounter: 10/07/2023 Cardiologist: Sherryl Manges, MD  Chief Complaint   No complaints  Patient Profile   Eric Lambert is a 89 y.o. male with a hx of tachybradycardia syndrome s/p PPM, permanent atrial fibrillation, OSA, bacterial endocarditis, GERD who is being seen 10/05/2023 for the evaluation of dyspnea on exertion/CHF at the request of Dr Deretha Emory.   Subjective   Diuresed about 500 ml negative. Creatinine is up today to 1.41.  Apparently he had a decompensation overnight- there is an incomplete hospitalist note - he was placed on CPAP - he apparently ripped it off. Says he was breathing better, but noted to be more short of breath today. Adamant about going home - has had some delirium and I think is delirious today- says he has recently had some hallucinations and has had some paranoid thoughts.   Objective   Vitals:   10/06/23 2315 10/07/23 0021 10/07/23 0400 10/07/23 0811  BP:  127/86 116/79 125/79  Pulse:  77 86 82  Resp: 19 18 16 16   Temp:  98.2 F (36.8 C) (!) 97.5 F (36.4 C)   TempSrc:  Axillary Oral Oral  SpO2: 97% 98% 97% 100%  Weight:   66.9 kg   Height:        Intake/Output Summary (Last 24 hours) at 10/07/2023 1018 Last data filed at 10/07/2023 0400 Gross per 24 hour  Intake 120 ml  Output 850 ml  Net -730 ml   Filed Weights   10/05/23 2157 10/06/23 0153 10/07/23 0400  Weight: 68 kg 68 kg 66.9 kg    Physical Exam   General appearance: alert, appears stated age, and no distress Neck: no carotid bruit, no JVD, and thyroid not enlarged, symmetric, no tenderness/mass/nodules Lungs: diminished breath sounds RLL Heart: irregularly irregular rhythm Abdomen: soft, non-tender; bowel sounds normal; no masses,  no organomegaly Extremities: extremities normal, atraumatic, no cyanosis or edema Pulses: 2+ and symmetric Skin: Skin color, texture, turgor normal. No rashes or  lesions Neurologic: Grossly normal Psych: Pleasant  Inpatient Medications    Scheduled Meds:  apixaban  5 mg Oral BID   vitamin B-12  1,000 mcg Oral Daily   famotidine  20 mg Oral QHS   furosemide  40 mg Intravenous Daily   pantoprazole  80 mg Oral Daily   senna  1 tablet Oral BID   verapamil  240 mg Oral Daily    Continuous Infusions:    PRN Meds: acetaminophen **OR** acetaminophen, haloperidol lactate, mouth rinse, traZODone   Labs   Results for orders placed or performed during the hospital encounter of 10/05/23 (from the past 48 hour(s))  Troponin I (High Sensitivity)     Status: Abnormal   Collection Time: 10/05/23 11:55 AM  Result Value Ref Range   Troponin I (High Sensitivity) 33 (H) <18 ng/L    Comment: (NOTE) Elevated high sensitivity troponin I (hsTnI) values and significant  changes across serial measurements may suggest ACS but many other  chronic and acute conditions are known to elevate hsTnI results.  Refer to the "Links" section for chest pain algorithms and additional  guidance. Performed at [facility], [address] [place], [place] [address]   Brain natriuretic peptide     Status: Abnormal   Collection Time: 10/05/23  4:33 PM  Result Value Ref Range   B Natriuretic Peptide 229.2 (H) 0.0 - 100.0 pg/mL    Comment: Performed at [facility]  [facility], [address] [place], [place] [address]  D-dimer, quantitative     Status: Abnormal   Collection Time: 10/05/23  4:33 PM  Result Value Ref Range   D-Dimer, Quant 0.86 (H) 0.00 - 0.50 ug/mL-FEU    Comment: (NOTE) At the manufacturer cut-off value of 0.5 g/mL FEU, this assay has a negative predictive value of 95-100%.This assay is intended for use in conjunction with a clinical pretest probability (PTP) assessment model to exclude pulmonary embolism (PE) and deep venous thrombosis (DVT) in outpatients suspected of PE or DVT. Results should be correlated with clinical presentation. Performed  at [facility], [address], [place], [place] [address]   Basic metabolic panel     Status: Abnormal   Collection Time: 10/06/23  4:31 AM  Result Value Ref Range   Sodium 139 135 - 145 mmol/L   Potassium 3.0 (L) 3.5 - 5.1 mmol/L   Chloride 104 98 - 111 mmol/L   CO2 26 22 - 32 mmol/L   Glucose, Bld 97 70 - 99 mg/dL    Comment: Glucose reference range applies only to samples taken after fasting for at least 8 hours.   BUN 29 (H) 8 - 23 mg/dL   Creatinine, Ser 4.74 (H) 0.61 - 1.24 mg/dL   Calcium 8.6 (L) 8.9 - 10.3 mg/dL   GFR, Estimated 54 (L) >60 mL/min    Comment: (NOTE) Calculated using the CKD-EPI Creatinine Equation (2021)    Anion gap 9 5 - 15    Comment: Performed at [facility], [address], [place], [place] [address]  Basic metabolic panel     Status: Abnormal   Collection Time: 10/07/23  3:49 AM  Result Value Ref Range   Sodium 140 135 - 145 mmol/L   Potassium 3.7 3.5 - 5.1 mmol/L   Chloride 102 98 - 111 mmol/L   CO2 26 22 - 32 mmol/L   Glucose, Bld 103 (H) 70 - 99 mg/dL    Comment: Glucose reference range applies only to samples taken after fasting for at least 8 hours.   BUN 39 (H) 8 - 23 mg/dL   Creatinine, Ser 3.87 (H) 0.61 - 1.24 mg/dL   Calcium 8.8 (L) 8.9 - 10.3 mg/dL   GFR, Estimated 47 (L) >60 mL/min    Comment: (NOTE) Calculated using the CKD-EPI Creatinine Equation (2021)    Anion gap 12 5 - 15    Comment: Performed at [facility], [address], [place], [place] [address]    ECG   N/A  Telemetry   Afib with RVR at times - Personally Reviewed  Radiology    Portable chest 1 View  Result Date: 10/06/2023 CLINICAL DATA:  Pleural effusion CHF EXAM: PORTABLE CHEST 1 VIEW COMPARISON:  X-ray 10/05/2023 and contrast CT scan FINDINGS: Stable cardiopericardial silhouette with calcified aorta. Left upper chest battery pack with leads along the right side of the heart. Chronic lung changes with persistent small effusion and  adjacent opacities, right lung base. No edema. Overlapping cardiac leads. IMPRESSION: Hyperinflation with chronic changes.  Pacemaker. Persistent small effusion and opacity in the right lung base Electronically Signed   By: Karen Kays M.D.   On: 10/06/2023 18:03   ECHOCARDIOGRAM COMPLETE  Result Date: 10/06/2023    ECHOCARDIOGRAM REPORT   Patient Name:   WESAM KIDNEY Date of Exam: 10/06/2023 Medical Rec #:  [id number]  Height:       70.0 in Accession #:    [id number]             Weight:       149.9 lb Date of Birth:  1929/10/12              BSA:          1.847 m Patient Age:    89 years               BP:           134/94 mmHg Patient Gender: M                      HR:           94 bpm. Exam Location:  Inpatient Procedure: 2D Echo, Cardiac Doppler and Color Doppler Indications:    CHF - Acute Diastolic I50.31  History:        Patient has prior history of Echocardiogram examinations, most                 recent 03/04/2021. CHF; Arrythmias:Atrial Fibrillation.  Sonographer:    Harriette Bouillon RDCS Referring Phys: [id number] MICHAEL E NORINS IMPRESSIONS  1. Left ventricular ejection fraction, by estimation, is 60 to 65%. The left ventricle has normal function. The left ventricle has no regional wall motion abnormalities. The left ventricular internal cavity size was mildly dilated. Left ventricular diastolic function could not be evaluated.  2. Right ventricular systolic function is normal. The right ventricular size is moderately enlarged. There is mildly elevated pulmonary artery systolic pressure. The estimated right ventricular systolic pressure is 36.2 mmHg.  3. Left atrial size was massively dilated.  4. Right atrial size was severely dilated.  5. The mitral valve is degenerative. Moderate mitral valve regurgitation. No evidence of mitral stenosis. There is mild late systolic prolapse of the middle scallop of the posterior leaflet of the mitral valve.  6. The aortic valve is tricuspid. There is  moderate calcification of the aortic valve. There is moderate thickening of the aortic valve. Aortic valve regurgitation is not visualized. Aortic valve sclerosis/calcification is present, without any evidence of aortic stenosis.  7. The inferior vena cava is normal in size with greater than 50% respiratory variability, suggesting right atrial pressure of 3 mmHg. FINDINGS  Left Ventricle: Left ventricular ejection fraction, by estimation, is 60 to 65%. The left ventricle has normal function. The left ventricle has no regional wall motion abnormalities. The left ventricular internal cavity size was mildly dilated. There is  no left ventricular hypertrophy. Left ventricular diastolic function could not be evaluated due to atrial fibrillation. Left ventricular diastolic function could not be evaluated. Right Ventricle: The right ventricular size is moderately enlarged. No increase in right ventricular wall thickness. Right ventricular systolic function is normal. There is mildly elevated pulmonary artery systolic pressure. The tricuspid regurgitant velocity is 2.88 m/s, and with an assumed right atrial pressure of 3 mmHg, the estimated right ventricular systolic pressure is 36.2 mmHg. Left Atrium: Left atrial size was massively dilated. Right Atrium: Right atrial size was severely dilated. Pericardium: Trivial pericardial effusion is present. The pericardial effusion is circumferential. Mitral Valve: The mitral valve is degenerative in appearance. There is mild late systolic prolapse of the middle scallop of the posterior leaflet of the mitral valve. There is mild thickening of the mitral valve leaflet(s). There is mild calcification of  the mitral valve leaflet(s). Mild mitral annular calcification. Moderate  mitral valve regurgitation, with eccentric anteriorly directed jet. No evidence of mitral valve stenosis. Tricuspid Valve: The tricuspid valve is normal in structure. Tricuspid valve regurgitation is not  demonstrated. No evidence of tricuspid stenosis. Aortic Valve: The aortic valve is tricuspid. There is moderate calcification of the aortic valve. There is moderate thickening of the aortic valve. Aortic valve regurgitation is not visualized. Aortic valve sclerosis/calcification is present, without any  evidence of aortic stenosis. Pulmonic Valve: The pulmonic valve was normal in structure. Pulmonic valve regurgitation is not visualized. No evidence of pulmonic stenosis. Aorta: The aortic root is normal in size and structure. Venous: The inferior vena cava is normal in size with greater than 50% respiratory variability, suggesting right atrial pressure of 3 mmHg. IAS/Shunts: No atrial level shunt detected by color flow Doppler. Additional Comments: A device lead is visualized.  LEFT VENTRICLE PLAX 2D LVIDd:         5.90 cm LVIDs:         4.10 cm LV PW:         1.00 cm LV IVS:        1.00 cm LVOT diam:     2.00 cm LV SV:         41 LV SV Index:   22 LVOT Area:     3.14 cm  RIGHT VENTRICLE             IVC RV S prime:     12.00 cm/s  IVC diam: 1.00 cm TAPSE (M-mode): 2.3 cm LEFT ATRIUM            Index        RIGHT ATRIUM           Index LA diam:      5.40 cm  2.92 cm/m   RA Area:     24.40 cm LA Vol (A2C): 121.0 ml 65.52 ml/m  RA Volume:   80.50 ml  43.59 ml/m LA Vol (A4C): 151.0 ml 81.77 ml/m  AORTIC VALVE LVOT Vmax:   83.90 cm/s LVOT Vmean:  51.600 cm/s LVOT VTI:    0.132 m  AORTA Ao Root diam: 3.10 cm Ao Asc diam:  2.90 cm MITRAL VALVE                TRICUSPID VALVE MV Area (PHT): 3.87 cm     TR Peak grad:   33.2 mmHg MV Decel Time: 196 msec     TR Vmax:        288.00 cm/s MV E velocity: 135.00 cm/s                             SHUNTS                             Systemic VTI:  0.13 m                             Systemic Diam: 2.00 cm Armanda Magic MD Electronically signed by Armanda Magic MD Signature Date/Time: 10/06/2023/8:56:08 AM    Final    CT Chest W Contrast  Result Date: 10/05/2023 CLINICAL DATA:   Weakness, shortness of breath. Altered mental status. Pneumonia, complication suspected, xray done Respiratory illness, nondiagnostic xray EXAM: CT CHEST WITH CONTRAST TECHNIQUE: Multidetector CT imaging of the chest was performed during intravenous contrast administration. RADIATION DOSE REDUCTION: This  exam was performed according to the departmental dose-optimization program which includes automated exposure control, adjustment of the mA and/or kV according to patient size and/or use of iterative reconstruction technique. CONTRAST:  50mL ISOVUE-370 IOPAMIDOL (ISOVUE-370) INJECTION 76% COMPARISON:  Chest x-ray today FINDINGS: Cardiovascular: Left chest wall pacer in place with leads in the right atrium and right ventricle. Cardiomegaly. Coronary artery and aortic atherosclerosis. No evidence of aortic aneurysm. No filling defects in the pulmonary arteries to suggest pulmonary emboli. Mediastinum/Nodes: No mediastinal, hilar, or axillary adenopathy. Trachea and esophagus are unremarkable. Thyroid unremarkable. Lungs/Pleura: Moderate right pleural effusion and small left pleural effusion. Compressive/dependent atelectasis in the lower lobes. Otherwise no confluent opacities. Upper Abdomen: No acute findings Musculoskeletal: Chest wall soft tissues are unremarkable. No acute bony abnormality. IMPRESSION: No evidence of pulmonary embolus. Coronary artery disease. Moderate right pleural effusion and small left pleural effusion with compressive/dependent atelectasis in the lower lobes. Aortic Atherosclerosis (ICD10-I70.0). Electronically Signed   By: Charlett Nose M.D.   On: 10/05/2023 12:58   CT Head Wo Contrast  Result Date: 10/05/2023 CLINICAL DATA:  Mental status change, unknown cause EXAM: CT HEAD WITHOUT CONTRAST TECHNIQUE: Contiguous axial images were obtained from the base of the skull through the vertex without intravenous contrast. RADIATION DOSE REDUCTION: This exam was performed according to the  departmental dose-optimization program which includes automated exposure control, adjustment of the mA and/or kV according to patient size and/or use of iterative reconstruction technique. COMPARISON:  None Available. FINDINGS: Brain: There is atrophy and chronic small vessel disease changes. No acute intracranial abnormality. Specifically, no hemorrhage, hydrocephalus, mass lesion, acute infarction, or significant intracranial injury. Vascular: No hyperdense vessel or unexpected calcification. Skull: No acute calvarial abnormality. Sinuses/Orbits: No acute findings Other: None IMPRESSION: Atrophy, chronic microvascular disease. No acute intracranial abnormality. Electronically Signed   By: Charlett Nose M.D.   On: 10/05/2023 12:55    Cardiac Studies   See echo above  Assessment   Principal Problem:   (HFpEF) heart failure with preserved ejection fraction (HCC) Active Problems:   Obstructive sleep apnea   Essential hypertension   Permanent atrial fibrillation (HCC)   GERD   Pleural effusion   Plan   Some issues with delirium -he has self-reported some hallucinations and paranoia. Wants to go home. Was apparently on CPAP last night, says he is more short of breath today. Creatinine trending up - given lasix today, will hold additional. D/w Dr. Ella Jubilee - not sure he is ready to d/c today. Wife at the bedside - says the neuro/psych changes are new.  Time Spent Directly with Patient:  I have spent a total of 25 minutes with the patient reviewing hospital notes, telemetry, EKGs, labs and examining the patient as well as establishing an assessment and plan that was discussed personally with the patient.  > 50% of time was spent in direct patient care.  Length of Stay:  LOS: 2 days   Chrystie Nose, MD, [facility], FACP  [place]  [facility]  Medical Director of the [facility]  [facility] Diplomate of the American Board of Clinical  Lipidology Attending Cardiologist  Direct Dial: [phone]  Fax: [phone]  Website:  [email or web address] Nicely Chudney Scheffler 10/07/2023, 10:18 AM

## 2023-10-07 NOTE — Progress Notes (Addendum)
  Progress Note   Patient: Eric Lambert RJJ:[id number] DOB: 07-Mar-1929 DOA: 10/05/2023     2 DOS: the patient was seen and examined on 10/07/2023   Brief hospital course: Mr. Eric Lambert was admitted to the hospital with the working diagnosis of heart failure decompensation.   89/M with history of tachybradycardia syndrome with PPM, permanent A-fib history of OSA, remote bacterial endocarditis presented to the ED with weakness and shortness of breath, in the ER labs noted sodium 136, creatinine 1.3, troponin 33, hemoglobin 13.4, chest x-ray with interstitial edema and small to moderate pleural effusions, CT chest with moderate right pleural effusion and small left, admitted started on diuretics, cardiology consulted   Patient was placed on furosemide for diuresis.   11/13 volume status improving, but not yet back to baseline.   Assessment and Plan: * Acute on chronic diastolic CHF (congestive heart failure) (HCC) Echocardiogram with preserved LV systolic function EF 60 to 65%, mild dilated cavity, no LVH, RV systolic function preserved, RVSP 36.2 mmHg, left and right atrium with severe dilatation, moderate mitral valve regurgitation,   Urine output is 1,125 ml Systolic blood pressure 116 to 127 mmHg.  Patient has lost about 1 kg since admission.   Plan to hold on diuretic therapy for now.   Acute cardiogenic pulmonary edema with right pleural effusions, clinically improving with diuresis.  02 saturation today is 100% on room air.   Essential hypertension Holding diuresis today.  Permanent atrial fibrillation (HCC) Reate controlled with verapamil, plan to continue anticoagulation with apixaban.  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   Obstructive sleep apnea Patient not longer using Cpap at night.   GERD Continue PPI   Chronic kidney disease, stage 3b (HCC) Hypokalemia  Renal function with serum cr at 1,41 with K at 3,7 and  serum bicarbonate at 26. Na 140  Plan to hold on diuresis today and follow up renal function and electrolytes in am.         Subjective: Patient with no chest pain, dyspnea and edema continue to improve, he is anxious about going home. He was not able to sleep well last night, and he dose not use Cpap at home   Physical Exam: Vitals:   10/06/23 2315 10/07/23 0021 10/07/23 0400 10/07/23 0811  BP:  127/86 116/79 125/79  Pulse:  77 86 82  Resp: 19 18 16 16   Temp:  98.2 F (36.8 C) (!) 97.5 F (36.4 C)   TempSrc:  Axillary Oral Oral  SpO2: 97% 98% 97% 100%  Weight:   66.9 kg   Height:       Neurology awake and alert ENT with mild pallor Cardiovascular with S1 and S2 present and regular, positive systolic murmur at the apex with no rubs or gallops,  No JVD No lower extremity edema  Respiratory with no rales or wheezing, no rhonchi Abdomen with no distention  Data Reviewed:    Family Communication: I spoke with patient's wife at the bedside, we talked in detail about patient's condition, plan of care and prognosis and all questions were addressed.   Disposition: Status is: Inpatient Remains inpatient appropriate because: improving volume status, possible discharge home tomorrow   Planned Discharge Destination: Home     Author: Coralie Keens, MD 10/07/2023 1:25 PM  For on call review [email or web address].

## 2023-10-08 ENCOUNTER — Telehealth: Payer: Self-pay | Admitting: Family Medicine

## 2023-10-08 ENCOUNTER — Other Ambulatory Visit (HOSPITAL_COMMUNITY): Payer: Self-pay

## 2023-10-08 DIAGNOSIS — N1832 Chronic kidney disease, stage 3b: Secondary | ICD-10-CM

## 2023-10-08 DIAGNOSIS — G4733 Obstructive sleep apnea (adult) (pediatric): Secondary | ICD-10-CM

## 2023-10-08 DIAGNOSIS — I5033 Acute on chronic diastolic (congestive) heart failure: Secondary | ICD-10-CM

## 2023-10-08 DIAGNOSIS — Z950 Presence of cardiac pacemaker: Secondary | ICD-10-CM | POA: Diagnosis not present

## 2023-10-08 DIAGNOSIS — I4821 Permanent atrial fibrillation: Secondary | ICD-10-CM | POA: Diagnosis not present

## 2023-10-08 LAB — BASIC METABOLIC PANEL
Anion gap: 11 (ref 5–15)
BUN: 41 mg/dL — ABNORMAL HIGH (ref 8–23)
CO2: 26 mmol/L (ref 22–32)
Calcium: 8.8 mg/dL — ABNORMAL LOW (ref 8.9–10.3)
Chloride: 100 mmol/L (ref 98–111)
Creatinine, Ser: 1.23 mg/dL (ref 0.61–1.24)
GFR, Estimated: 55 mL/min — ABNORMAL LOW (ref >60)
Glucose, Bld: 92 mg/dL (ref 70–99)
Potassium: 4 mmol/L (ref 3.5–5.1)
Sodium: 137 mmol/L (ref 135–145)

## 2023-10-08 MED ORDER — FUROSEMIDE 40 MG PO TABS: 40.0000 mg | ORAL_TABLET | Freq: Every day | ORAL | Status: DC

## 2023-10-08 MED ORDER — FUROSEMIDE 40 MG PO TABS
40.0000 mg | ORAL_TABLET | Freq: Every day | ORAL | 0 refills | Status: DC
  Filled 2023-10-08: qty 30, 30d supply, fill #0

## 2023-10-08 NOTE — Telephone Encounter (Signed)
November 20 at 1140 but let him know I will likely be running behind as we are having to work him in at the end of the morning schedule

## 2023-10-08 NOTE — Progress Notes (Signed)
DAILY PROGRESS NOTE   Patient Name: Eric Lambert Date of Encounter: 10/08/2023 Cardiologist: Sherryl Manges, MD  Chief Complaint   No complaints  Patient Profile   Eric Lambert is a 89 y.o. male with a hx of tachybradycardia syndrome s/p PPM, permanent atrial fibrillation, OSA, bacterial endocarditis, GERD who is being seen 10/05/2023 for the evaluation of dyspnea on exertion/CHF at the request of Dr Deretha Emory.   Subjective   About net even overnight. BMET pending today. Weight is lower today. Off diuretics.   Objective   Vitals:   10/07/23 1932 10/08/23 0051 10/08/23 0328 10/08/23 0819  BP: 107/61 (!) 104/54 126/72 (!) 119/56  Pulse: 61 82 79 89  Resp: 16 16 18 18   Temp: 98.1 F (36.7 C) 98.1 F (36.7 C) 98.1 F (36.7 C) 98.1 F (36.7 C)  TempSrc: Oral Oral Axillary Oral  SpO2: 96% 96% 96% 96%  Weight:   65.7 kg   Height:        Intake/Output Summary (Last 24 hours) at 10/08/2023 1020 Last data filed at 10/08/2023 0725 Gross per 24 hour  Intake 480 ml  Output 200 ml  Net 280 ml   Filed Weights   10/06/23 0153 10/07/23 0400 10/08/23 0328  Weight: 68 kg 66.9 kg 65.7 kg    Physical Exam   General appearance: alert, appears stated age, and no distress Neck: no carotid bruit, no JVD, and thyroid not enlarged, symmetric, no tenderness/mass/nodules Lungs: diminished breath sounds RLL Heart: irregularly irregular rhythm Abdomen: soft, non-tender; bowel sounds normal; no masses,  no organomegaly Extremities: extremities normal, atraumatic, no cyanosis or edema Pulses: 2+ and symmetric Skin: Skin color, texture, turgor normal. No rashes or lesions Neurologic: Grossly normal Psych: Pleasant  Inpatient Medications    Scheduled Meds:  apixaban  5 mg Oral BID   vitamin B-12  1,000 mcg Oral Daily   famotidine  20 mg Oral QHS   pantoprazole  80 mg Oral Daily   senna  1 tablet Oral BID   traZODone  50 mg Oral QHS   verapamil  240 mg Oral  Daily    Continuous Infusions:    PRN Meds: acetaminophen **OR** acetaminophen, mouth rinse   Labs   Results for orders placed or performed during the hospital encounter of 10/05/23 (from the past 48 hour(s))  Basic metabolic panel     Status: Abnormal   Collection Time: 10/07/23  3:49 AM  Result Value Ref Range   Sodium 140 135 - 145 mmol/L   Potassium 3.7 3.5 - 5.1 mmol/L   Chloride 102 98 - 111 mmol/L   CO2 26 22 - 32 mmol/L   Glucose, Bld 103 (H) 70 - 99 mg/dL    Comment: Glucose reference range applies only to samples taken after fasting for at least 8 hours.   BUN 39 (H) 8 - 23 mg/dL   Creatinine, Ser 5.40 (H) 0.61 - 1.24 mg/dL   Calcium 8.8 (L) 8.9 - 10.3 mg/dL   GFR, Estimated 47 (L) >60 mL/min    Comment: (NOTE) Calculated using the CKD-EPI Creatinine Equation (2021)    Anion gap 12 5 - 15    Comment: Performed at [facility], [address], [place], [place] [address]    ECG   N/A  Telemetry   Afib with RVR at times - Personally Reviewed  Radiology    Portable chest 1 View  Result Date: 10/06/2023 CLINICAL DATA:  Pleural effusion CHF EXAM: PORTABLE CHEST  1 VIEW COMPARISON:  X-ray 10/05/2023 and contrast CT scan FINDINGS: Stable cardiopericardial silhouette with calcified aorta. Left upper chest battery pack with leads along the right side of the heart. Chronic lung changes with persistent small effusion and adjacent opacities, right lung base. No edema. Overlapping cardiac leads. IMPRESSION: Hyperinflation with chronic changes.  Pacemaker. Persistent small effusion and opacity in the right lung base Electronically Signed   By: Karen Kays M.D.   On: 10/06/2023 18:03    Cardiac Studies   Echo reviewed  Assessment   Principal Problem:   Acute on chronic diastolic CHF (congestive heart failure) (HCC) Active Problems:   Obstructive sleep apnea   Essential hypertension   Permanent atrial fibrillation (HCC)   GERD   Chronic kidney disease,  stage 3b (HCC)   Plan   Awaiting repeat BMET today - wants to go home. Would benefit from some degree of diuresis for presumed diastolic dysfunction. BP has remained soft. Transition to lasix 40 mg daily tomorrow.  Can likely be discharged home today from my standpoint - having some sundowning, would benefit from getting back home.  Time Spent Directly with Patient:  I have spent a total of 25 minutes with the patient reviewing hospital notes, telemetry, EKGs, labs and examining the patient as well as establishing an assessment and plan that was discussed personally with the patient.  > 50% of time was spent in direct patient care.  Length of Stay:  LOS: 3 days   Chrystie Nose, MD, [facility], FACP  [place]  [facility]  Medical Director of the [facility]  [facility] Diplomate of the American Board of Clinical Lipidology Attending Cardiologist  Direct Dial: [phone]  Fax: [phone]  Website:  [email or web address] Nicely Dashonna Chagnon 10/08/2023, 10:20 AM

## 2023-10-08 NOTE — Plan of Care (Signed)
  Problem: Education: Goal: Ability to demonstrate management of disease process will improve Outcome: Progressing Goal: Ability to verbalize understanding of medication therapies will improve Outcome: Progressing Goal: Individualized Educational Video(s) Outcome: Progressing   Problem: Activity: Goal: Capacity to carry out activities will improve Outcome: Progressing   Problem: Cardiac: Goal: Ability to achieve and maintain adequate cardiopulmonary perfusion will improve Outcome: Progressing   Problem: Education: Goal: Knowledge of General Education information will improve Description: Including pain rating scale, medication(s)/side effects and non-pharmacologic comfort measures Outcome: Progressing   Problem: Health Behavior/Discharge Planning: Goal: Ability to manage health-related needs will improve Outcome: Progressing   Problem: Clinical Measurements: Goal: Ability to maintain clinical measurements within normal limits will improve Outcome: Progressing Goal: Will remain free from infection Outcome: Progressing Goal: Diagnostic test results will improve Outcome: Progressing Goal: Respiratory complications will improve Outcome: Progressing Goal: Cardiovascular complication will be avoided Outcome: Progressing   Problem: Activity: Goal: Risk for activity intolerance will decrease Outcome: Progressing   Problem: Nutrition: Goal: Adequate nutrition will be maintained Outcome: Progressing   Problem: Coping: Goal: Level of anxiety will decrease Outcome: Progressing   Problem: Elimination: Goal: Will not experience complications related to bowel motility Outcome: Progressing Goal: Will not experience complications related to urinary retention Outcome: Progressing   Problem: Pain Management: Goal: General experience of comfort will improve Outcome: Progressing   Problem: Safety: Goal: Ability to remain free from injury will improve Outcome: Progressing    Problem: Skin Integrity: Goal: Risk for impaired skin integrity will decrease Outcome: Progressing   Problem: Safety: Goal: Non-violent Restraint(s) Outcome: Progressing   Problem: Self-Concept: Goal: Level of anxiety will decrease Outcome: Progressing

## 2023-10-08 NOTE — Telephone Encounter (Signed)
See below

## 2023-10-08 NOTE — Discharge Summary (Signed)
Physician Discharge Summary   Patient: Eric Lambert MRN: [id number] DOB: 05/28/1929  Admit date:     10/05/2023  Discharge date: 10/08/23  Discharge Physician: York Ram Eh Sesay   PCP: [name], MD   Recommendations at discharge:    Patient has been placed on furosemide for diuresis. Heart failure guideline directed medical therapy limited due to risk of hypotension and worsening renal function. To consider adding SGLT 2 inh as outpatient.  Follow up with Dr Durene Cal in 7 to 10 days Follow up renal function and electrolytes in 7 days.  Follow up with Cardiology as scheduled.   Discharge Diagnoses: Principal Problem:   Acute on chronic diastolic CHF (congestive heart failure) (HCC) Active Problems:   Essential hypertension   Permanent atrial fibrillation (HCC)   Chronic kidney disease, stage 3b (HCC)   GERD   Obstructive sleep apnea  Resolved Problems:   * No resolved hospital problems. [facility] Course: Eric Lambert was admitted to the hospital with the working diagnosis of heart failure decompensation.   89 yo male with past medical history of atrial fibrillation, and history of bacterial endocarditis in 1970, complicated with complete heart block and heart failure.  Reported worsening dyspnea on exertion and not able to sleep, symptoms that prompted him to come to the ED. On his initial physical examination his blood pressure was 118/73, HR 80, RR 31 and 02 saturation 94%, lungs with bilateral rales, dullness to percussion on the bases, with no wheezing, heart with S1 and S2 present, irregularly irregular, with no murmurs, abdomen with no distention and no lower extremity edema.   Na 136, K 4,0 Cl 106 bicarbonate 22, glucose 120, bun 39 and cr 1.31  BNP 229  High sensitive troponin 33 and 33 Wbc 8,0 hgb 13.4 plt 130  D dimer 0,86   Chest radiograph with hyperinflation, positive bilateral hilar vascular congestion and bilateral pleural effusions,  more right than left.   CT chest with faint bilateral ground glass opacities, bilateral pleural effusions, more right than left, no pulmonary embolism.   CT head with atrophy and chronic microvascular disease.  No acute intracranial abnormalities.   EKG 60 bpm, left axis deviation, prolonged qrd with left bundle branch morphology, ventricular paced rhythm with no significant ST segment or T wave changes.    Patient was placed on furosemide for diuresis.   11/13 volume status improving, but not yet back to baseline.  11/14 patient will continue oral diuretic therapy and have close follow up as outpatient.   Assessment and Plan: * Acute on chronic diastolic CHF (congestive heart failure) (HCC) Echocardiogram with preserved LV systolic function EF 60 to 65%, mild dilated cavity, no LVH, RV systolic function preserved, RVSP 36.2 mmHg, left and right atrium with severe dilatation, moderate mitral valve regurgitation,   Patient was placed on furosemide for diuresis, negative fluid balance was achieved, -1,845 ml, with significant improvement in his symptoms.    Plan to continue diuretic therapy with furosemide.  Further guideline directed medical therapy for heart failure limited due to reduced GFR and risk of hypotension.   Acute cardiogenic pulmonary edema with right pleural effusions, clinically improving with diuresis.  02 saturation today is 96% on room air.   Essential hypertension Blood pressure has been stable.  At home he has been on verapamil.    Permanent atrial fibrillation (HCC) Reate controlled with verapamil, plan to continue anticoagulation with apixaban.   Chronic kidney disease, stage 3b (HCC) Hypokalemia  Pending renal  function today.   GERD Continue proton pump inhibitors.   Obstructive sleep apnea Patient not longer using Cpap at night.          Consultants: cardiology  Procedures performed: none   Disposition: Home Diet recommendation:  Cardiac  diet DISCHARGE MEDICATION: Allergies as of 10/08/2023   No Known Allergies      Medication List     TAKE these medications    Eliquis 5 MG Tabs tablet Generic drug: apixaban Take 1 tablet by mouth twice daily   famotidine 40 MG tablet Commonly known as: Pepcid Take 1 tablet (40 mg total) by mouth at bedtime.   furosemide 40 MG tablet Commonly known as: LASIX Take 1 tablet (40 mg total) by mouth daily. Start taking on: October 09, 2023   OCUVITE EYE HEALTH FORMULA PO Take 1 tablet by mouth in the morning and at bedtime.   omeprazole 40 MG capsule Commonly known as: PRILOSEC TAKE 1 CAPSULE BY MOUTH TWICE DAILY 30-60 MINUTES BEFORE MEALS (BREAKFAST AND DINNER)   verapamil 240 MG CR tablet Commonly known as: CALAN-SR Take 1 tablet by mouth once daily   VITAMIN B-12 PO Take 2 tablets by mouth daily.   VITAMIN D-3 PO Take 1 tablet by mouth daily.        Discharge Exam: Filed Weights   10/06/23 0153 10/07/23 0400 10/08/23 0328  Weight: 68 kg 66.9 kg 65.7 kg   BP (!) 119/56 (BP Location: Left Arm)   Pulse 89   Temp 98.1 F (36.7 C) (Oral)   Resp 18   Ht 5\' 10"  (1.778 m)   Wt 65.7 kg   SpO2 96%   BMI 20.78 kg/m   Patient with no chest pain or dyspnea, no orthopnea or lower extremity edema  Neurology awake and alert ENT with no pallor Cardiovascular with S1 and S2 present and regular with no gallops, or rubs, positive systolic murmur at the apex No JVD No lower extremity edema Respiratory with non rales or wheezing, no rhonchi Abdomen with no distention   Condition at discharge: stable  The results of significant diagnostics from this hospitalization (including imaging, microbiology, ancillary and laboratory) are listed below for reference.   Imaging Studies: Portable chest 1 View  Result Date: 10/06/2023 CLINICAL DATA:  Pleural effusion CHF EXAM: PORTABLE CHEST 1 VIEW COMPARISON:  X-ray 10/05/2023 and contrast CT scan FINDINGS: Stable  cardiopericardial silhouette with calcified aorta. Left upper chest battery pack with leads along the right side of the heart. Chronic lung changes with persistent small effusion and adjacent opacities, right lung base. No edema. Overlapping cardiac leads. IMPRESSION: Hyperinflation with chronic changes.  Pacemaker. Persistent small effusion and opacity in the right lung base Electronically Signed   By: Karen Kays M.D.   On: 10/06/2023 18:03   ECHOCARDIOGRAM COMPLETE  Result Date: 10/06/2023    ECHOCARDIOGRAM REPORT   Patient Name:   CHANCEY BLAUSER Date of Exam: 10/06/2023 Medical Rec #:  [id number]              Height:       70.0 in Accession #:    [id number]             Weight:       149.9 lb Date of Birth:  1929-11-21              BSA:          1.847 m Patient Age:    29 years  BP:           134/94 mmHg Patient Gender: M                      HR:           94 bpm. Exam Location:  Inpatient Procedure: 2D Echo, Cardiac Doppler and Color Doppler Indications:    CHF - Acute Diastolic I50.31  History:        Patient has prior history of Echocardiogram examinations, most                 recent 03/04/2021. CHF; Arrythmias:Atrial Fibrillation.  Sonographer:    Harriette Bouillon RDCS Referring Phys: [id number] MICHAEL E NORINS IMPRESSIONS  1. Left ventricular ejection fraction, by estimation, is 60 to 65%. The left ventricle has normal function. The left ventricle has no regional wall motion abnormalities. The left ventricular internal cavity size was mildly dilated. Left ventricular diastolic function could not be evaluated.  2. Right ventricular systolic function is normal. The right ventricular size is moderately enlarged. There is mildly elevated pulmonary artery systolic pressure. The estimated right ventricular systolic pressure is 36.2 mmHg.  3. Left atrial size was massively dilated.  4. Right atrial size was severely dilated.  5. The mitral valve is degenerative. Moderate mitral valve  regurgitation. No evidence of mitral stenosis. There is mild late systolic prolapse of the middle scallop of the posterior leaflet of the mitral valve.  6. The aortic valve is tricuspid. There is moderate calcification of the aortic valve. There is moderate thickening of the aortic valve. Aortic valve regurgitation is not visualized. Aortic valve sclerosis/calcification is present, without any evidence of aortic stenosis.  7. The inferior vena cava is normal in size with greater than 50% respiratory variability, suggesting right atrial pressure of 3 mmHg. FINDINGS  Left Ventricle: Left ventricular ejection fraction, by estimation, is 60 to 65%. The left ventricle has normal function. The left ventricle has no regional wall motion abnormalities. The left ventricular internal cavity size was mildly dilated. There is  no left ventricular hypertrophy. Left ventricular diastolic function could not be evaluated due to atrial fibrillation. Left ventricular diastolic function could not be evaluated. Right Ventricle: The right ventricular size is moderately enlarged. No increase in right ventricular wall thickness. Right ventricular systolic function is normal. There is mildly elevated pulmonary artery systolic pressure. The tricuspid regurgitant velocity is 2.88 m/s, and with an assumed right atrial pressure of 3 mmHg, the estimated right ventricular systolic pressure is 36.2 mmHg. Left Atrium: Left atrial size was massively dilated. Right Atrium: Right atrial size was severely dilated. Pericardium: Trivial pericardial effusion is present. The pericardial effusion is circumferential. Mitral Valve: The mitral valve is degenerative in appearance. There is mild late systolic prolapse of the middle scallop of the posterior leaflet of the mitral valve. There is mild thickening of the mitral valve leaflet(s). There is mild calcification of  the mitral valve leaflet(s). Mild mitral annular calcification. Moderate mitral valve  regurgitation, with eccentric anteriorly directed jet. No evidence of mitral valve stenosis. Tricuspid Valve: The tricuspid valve is normal in structure. Tricuspid valve regurgitation is not demonstrated. No evidence of tricuspid stenosis. Aortic Valve: The aortic valve is tricuspid. There is moderate calcification of the aortic valve. There is moderate thickening of the aortic valve. Aortic valve regurgitation is not visualized. Aortic valve sclerosis/calcification is present, without any  evidence of aortic stenosis. Pulmonic Valve: The pulmonic valve was normal in structure. Pulmonic valve  regurgitation is not visualized. No evidence of pulmonic stenosis. Aorta: The aortic root is normal in size and structure. Venous: The inferior vena cava is normal in size with greater than 50% respiratory variability, suggesting right atrial pressure of 3 mmHg. IAS/Shunts: No atrial level shunt detected by color flow Doppler. Additional Comments: A device lead is visualized.  LEFT VENTRICLE PLAX 2D LVIDd:         5.90 cm LVIDs:         4.10 cm LV PW:         1.00 cm LV IVS:        1.00 cm LVOT diam:     2.00 cm LV SV:         41 LV SV Index:   22 LVOT Area:     3.14 cm  RIGHT VENTRICLE             IVC RV S prime:     12.00 cm/s  IVC diam: 1.00 cm TAPSE (M-mode): 2.3 cm LEFT ATRIUM            Index        RIGHT ATRIUM           Index LA diam:      5.40 cm  2.92 cm/m   RA Area:     24.40 cm LA Vol (A2C): 121.0 ml 65.52 ml/m  RA Volume:   80.50 ml  43.59 ml/m LA Vol (A4C): 151.0 ml 81.77 ml/m  AORTIC VALVE LVOT Vmax:   83.90 cm/s LVOT Vmean:  51.600 cm/s LVOT VTI:    0.132 m  AORTA Ao Root diam: 3.10 cm Ao Asc diam:  2.90 cm MITRAL VALVE                TRICUSPID VALVE MV Area (PHT): 3.87 cm     TR Peak grad:   33.2 mmHg MV Decel Time: 196 msec     TR Vmax:        288.00 cm/s MV E velocity: 135.00 cm/s                             SHUNTS                             Systemic VTI:  0.13 m                             Systemic  Diam: 2.00 cm Armanda Magic MD Electronically signed by Armanda Magic MD Signature Date/Time: 10/06/2023/8:56:08 AM    Final    CT Chest W Contrast  Result Date: 10/05/2023 CLINICAL DATA:  Weakness, shortness of breath. Altered mental status. Pneumonia, complication suspected, xray done Respiratory illness, nondiagnostic xray EXAM: CT CHEST WITH CONTRAST TECHNIQUE: Multidetector CT imaging of the chest was performed during intravenous contrast administration. RADIATION DOSE REDUCTION: This exam was performed according to the departmental dose-optimization program which includes automated exposure control, adjustment of the mA and/or kV according to patient size and/or use of iterative reconstruction technique. CONTRAST:  50mL ISOVUE-370 IOPAMIDOL (ISOVUE-370) INJECTION 76% COMPARISON:  Chest x-ray today FINDINGS: Cardiovascular: Left chest wall pacer in place with leads in the right atrium and right ventricle. Cardiomegaly. Coronary artery and aortic atherosclerosis. No evidence of aortic aneurysm. No filling defects in the pulmonary arteries to suggest pulmonary emboli. Mediastinum/Nodes: No mediastinal, hilar, or  axillary adenopathy. Trachea and esophagus are unremarkable. Thyroid unremarkable. Lungs/Pleura: Moderate right pleural effusion and small left pleural effusion. Compressive/dependent atelectasis in the lower lobes. Otherwise no confluent opacities. Upper Abdomen: No acute findings Musculoskeletal: Chest wall soft tissues are unremarkable. No acute bony abnormality. IMPRESSION: No evidence of pulmonary embolus. Coronary artery disease. Moderate right pleural effusion and small left pleural effusion with compressive/dependent atelectasis in the lower lobes. Aortic Atherosclerosis (ICD10-I70.0). Electronically Signed   By: Charlett Nose M.D.   On: 10/05/2023 12:58   CT Head Wo Contrast  Result Date: 10/05/2023 CLINICAL DATA:  Mental status change, unknown cause EXAM: CT HEAD WITHOUT CONTRAST  TECHNIQUE: Contiguous axial images were obtained from the base of the skull through the vertex without intravenous contrast. RADIATION DOSE REDUCTION: This exam was performed according to the departmental dose-optimization program which includes automated exposure control, adjustment of the mA and/or kV according to patient size and/or use of iterative reconstruction technique. COMPARISON:  None Available. FINDINGS: Brain: There is atrophy and chronic small vessel disease changes. No acute intracranial abnormality. Specifically, no hemorrhage, hydrocephalus, mass lesion, acute infarction, or significant intracranial injury. Vascular: No hyperdense vessel or unexpected calcification. Skull: No acute calvarial abnormality. Sinuses/Orbits: No acute findings Other: None IMPRESSION: Atrophy, chronic microvascular disease. No acute intracranial abnormality. Electronically Signed   By: Charlett Nose M.D.   On: 10/05/2023 12:55   DG Chest 2 View  Result Date: 10/05/2023 CLINICAL DATA:  89 year old male with weakness, hypertension, shortness of breath. EXAM: CHEST - 2 VIEW COMPARISON:  Chest radiographs 07/18/2021 and earlier. FINDINGS: AP and lateral views 0859 hours. Lower lung volumes. Cardiomegaly appears progressed. Chronic left chest dual lead pacemaker. New bilateral pleural effusions, small to moderate and greater on the right. Patchy superimposed lung base opacity. Increased pulmonary vascularity elsewhere. No pneumothorax. No definite air bronchograms. No acute osseous abnormality identified. 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 Negative visible bowel gas. IMPRESSION: Cardiomegaly with constellation of abnormal lung opacity most suggestive of pulmonary interstitial edema, pleural effusions (small to moderate). Infection felt less likely. Electronically Signed   By: Odessa Fleming M.D.   On: 10/05/2023 09:06    Microbiology: Results for orders placed or performed  during the hospital encounter of 01/03/22  SARS CORONAVIRUS 2 (TAT 6-24 HRS) Nasopharyngeal Nasopharyngeal Swab     Status: None   Collection Time: 01/03/22  7:11 AM   Specimen: Nasopharyngeal Swab  Result Value Ref Range Status   SARS Coronavirus 2 NEGATIVE NEGATIVE Final    Comment: (NOTE) SARS-CoV-2 target nucleic acids are NOT DETECTED.  The SARS-CoV-2 RNA is generally detectable in upper and lower respiratory specimens during the acute phase of infection. Negative results do not preclude SARS-CoV-2 infection, do not rule out co-infections with other pathogens, and should not be used as the sole basis for treatment or other patient management decisions. Negative results must be combined with clinical observations, patient history, and epidemiological information. The expected result is Negative.  Fact Sheet for Patients: HairSlick.no  Fact Sheet for Healthcare Providers: quierodirigir.com  This test is not yet approved or cleared by the [place] FDA and  has been authorized for detection and/or diagnosis of SARS-CoV-2 by FDA under an Emergency Use Authorization (EUA). This EUA will remain  in effect (meaning this test can be used) for the duration of the COVID-19 declaration under Se ction 564(b)(1) of the Act, 21 U.S.C. section 360bbb-3(b)(1), unless the authorization is terminated or revoked sooner.  Performed at [facility]  [facility], [address] [place], [place] [address]     Labs: CBC: Recent Labs  Lab 10/05/23 0924  WBC 8.0  HGB 13.4  HCT 40.7  MCV 89.8  PLT 130*   Basic Metabolic Panel: Recent Labs  Lab 10/05/23 0924 10/06/23 0431 10/07/23 0349  NA 136 139 140  K 4.0 3.0* 3.7  CL 106 104 102  CO2 22 26 26   GLUCOSE 120* 97 103*  BUN 39* 29* 39*  CREATININE 1.31* 1.25* 1.41*  CALCIUM 9.1 8.6* 8.8*   Liver Function Tests: No results for input(s): "AST", "ALT", "ALKPHOS", "BILITOT",  "PROT", "ALBUMIN" in the last 168 hours. CBG: No results for input(s): "GLUCAP" in the last 168 hours.  Discharge time spent: greater than 30 minutes.  Signed: Coralie Keens, MD [facility] 10/08/2023

## 2023-10-08 NOTE — Telephone Encounter (Signed)
Caller was a Engineer, civil (consulting) from [employer]. States patient needs to be scheduled for a hospital f/u before 10/28/23. States he is getting discharged today. Pcp has no openings until 12/30. Please Advise.

## 2023-10-08 NOTE — TOC Transition Note (Signed)
Transition of Care [facility]) - CM/SW Discharge Note   Patient Details  Name: Eric Lambert MRN: [id number] Date of Birth: August 20, 1929  [facility]) CM/SW Contact:  Leone Haven, RN Phone Number: 10/08/2023, 1:55 PM   Clinical Narrative:    For dc today, TOC pharmacy to fill meds. He has transportation.     Barriers to Discharge: Continued Medical Work up   Patient Goals and CMS Choice   Choice offered to / list presented to : NA  Discharge Placement                         Discharge Plan and Services Additional resources added to the After Visit Summary for   In-house Referral: NA Discharge Planning Services: CM Consult Post Acute Care Choice: NA          DME Arranged: N/A         HH Arranged: NA          Social Determinants of Health (SDOH) Interventions SDOH Screenings   Food Insecurity: No Food Insecurity (10/05/2023)  Housing: Low Risk  (10/05/2023)  Transportation Needs: No Transportation Needs (10/05/2023)  Utilities: Not At Risk (10/05/2023)  Depression (PHQ2-9): Low Risk  (05/09/2022)  Tobacco Use: Low Risk  (10/05/2023)     Readmission Risk Interventions     No data to display

## 2023-10-08 NOTE — Progress Notes (Signed)
Mobility Specialist Progress Note:    10/08/23 1217  Mobility  Activity Ambulated with assistance in hallway  Level of Assistance Standby assist, set-up cues, supervision of patient - no hands on  Assistive Device Cane  Distance Ambulated (ft) 250 ft  Activity Response Tolerated well  Mobility Referral Yes  $Mobility charge 1 Mobility  Mobility Specialist Start Time (ACUTE ONLY) 1030  Mobility Specialist Stop Time (ACUTE ONLY) 1045  Mobility Specialist Time Calculation (min) (ACUTE ONLY) 15 min   Received pt in chair having no complaints and agreeable to mobility. Pt was asymptomatic throughout ambulation and returned to room w/o fault. Left in chair w/ call bell in reach and all needs met. Chair alarm on and family in room.   Thompson Grayer Mobility Specialist  Please contact vis Secure Chat or  Rehab Office [phone]

## 2023-10-08 NOTE — Care Management Important Message (Signed)
Important Message  Patient Details  Name: Eric Lambert MRN: [id number] Date of Birth: 01/01/1929   Important Message Given:  Yes - Medicare IM     Dorena Bodo 10/08/2023, 3:01 PM

## 2023-10-09 ENCOUNTER — Telehealth: Payer: Self-pay | Admitting: *Deleted

## 2023-10-09 NOTE — Transitions of Care (Post Inpatient/ED Visit) (Signed)
   10/09/2023  Name: Eric Lambert MRN: [id number] DOB: Jul 28, 1929  Today's TOC FU Call Status: Today's TOC FU Call Status:: Unsuccessful Call (1st Attempt) Unsuccessful Call (1st Attempt) Date: 10/09/23  Attempted to reach the patient regarding the most recent Inpatient/ED visit.  Follow Up Plan: Additional outreach attempts will be made to reach the patient to complete the Transitions of Care (Post Inpatient/ED visit) call.   Irving Shows [facility], BSN RN Care Manager/ Transitions of Care [place] [facility] [phone]

## 2023-10-12 ENCOUNTER — Inpatient Hospital Stay: Payer: Medicare HMO | Admitting: Family

## 2023-10-12 ENCOUNTER — Other Ambulatory Visit: Payer: Self-pay | Admitting: Physician Assistant

## 2023-10-12 ENCOUNTER — Telehealth: Payer: Self-pay

## 2023-10-12 NOTE — Transitions of Care (Post Inpatient/ED Visit) (Signed)
   10/12/2023  Name: Concetto Singleton MRN: [id number] DOB: December 15, 1928  Today's TOC FU Call Status: Today's TOC FU Call Status:: Unsuccessful Call (3rd Attempt) Unsuccessful Call (1st Attempt) Date: 10/09/23 Unsuccessful Call (2nd Attempt) Date: 10/12/23 Unsuccessful Call (3rd Attempt) Date: 10/12/23  Attempted to reach the patient regarding the most recent Inpatient/ED visit.  Follow Up Plan: No further outreach attempts will be made at this time. We have been unable to contact the patient.   Susa Loffler , BSN, RN Care Management Coordinator [place]   [facility] [email or web address] Direct Dial: [phone]

## 2023-10-12 NOTE — Telephone Encounter (Signed)
This encounter was created in error - please disregard.

## 2023-10-12 NOTE — Transitions of Care (Post Inpatient/ED Visit) (Signed)
   10/12/2023  Name: Eric Lambert MRN: [id number] DOB: 1929-10-30  Today's TOC FU Call Status: Today's TOC FU Call Status:: Unsuccessful Call (2nd Attempt) Unsuccessful Call (2nd Attempt) Date: 10/12/23  Attempted to reach the patient regarding the most recent Inpatient/ED visit.  Follow Up Plan: Additional outreach attempts will be made to reach the patient to complete the Transitions of Care (Post Inpatient/ED visit) call.      [name], RN,BSN,CCM RN Care Manager Transitions of Care  [facility]  Direct Phone: [phone] Toll Free: [phone] Fax: [phone]

## 2023-10-12 NOTE — Telephone Encounter (Signed)
See below

## 2023-10-13 ENCOUNTER — Ambulatory Visit: Payer: Medicare HMO | Admitting: Family Medicine

## 2023-10-13 ENCOUNTER — Encounter: Payer: Self-pay | Admitting: Family Medicine

## 2023-10-13 VITALS — BP 116/64 | HR 68 | Temp 97.6°F | Ht 70.0 in | Wt 149.0 lb

## 2023-10-13 DIAGNOSIS — E785 Hyperlipidemia, unspecified: Secondary | ICD-10-CM

## 2023-10-13 DIAGNOSIS — D696 Thrombocytopenia, unspecified: Secondary | ICD-10-CM | POA: Diagnosis not present

## 2023-10-13 DIAGNOSIS — I700 Atherosclerosis of aorta: Secondary | ICD-10-CM

## 2023-10-13 DIAGNOSIS — I10 Essential (primary) hypertension: Secondary | ICD-10-CM | POA: Diagnosis not present

## 2023-10-13 DIAGNOSIS — I4821 Permanent atrial fibrillation: Secondary | ICD-10-CM

## 2023-10-13 DIAGNOSIS — F5102 Adjustment insomnia: Secondary | ICD-10-CM

## 2023-10-13 DIAGNOSIS — N1832 Chronic kidney disease, stage 3b: Secondary | ICD-10-CM

## 2023-10-13 DIAGNOSIS — I5032 Chronic diastolic (congestive) heart failure: Secondary | ICD-10-CM | POA: Diagnosis not present

## 2023-10-13 LAB — CBC WITH DIFFERENTIAL/PLATELET
Basophils Absolute: 0 10*3/uL (ref 0.0–0.1)
Basophils Relative: 0.2 % (ref 0.0–3.0)
Eosinophils Absolute: 0 10*3/uL (ref 0.0–0.7)
Eosinophils Relative: 0.7 % (ref 0.0–5.0)
HCT: 40.5 % (ref 39.0–52.0)
Hemoglobin: 13.5 g/dL (ref 13.0–17.0)
Lymphocytes Relative: 15.2 % (ref 12.0–46.0)
Lymphs Abs: 1 10*3/uL (ref 0.7–4.0)
MCHC: 33.4 g/dL (ref 30.0–36.0)
MCV: 91 fL (ref 78.0–100.0)
Monocytes Absolute: 0.6 10*3/uL (ref 0.1–1.0)
Monocytes Relative: 9.5 % (ref 3.0–12.0)
Neutro Abs: 5 10*3/uL (ref 1.4–7.7)
Neutrophils Relative %: 74.4 % (ref 43.0–77.0)
Platelets: 143 10*3/uL — ABNORMAL LOW (ref 150.0–400.0)
RBC: 4.45 Mil/uL (ref 4.22–5.81)
RDW: 14.3 % (ref 11.5–15.5)
WBC: 6.7 10*3/uL (ref 4.0–10.5)

## 2023-10-13 LAB — COMPREHENSIVE METABOLIC PANEL
ALT: 23 U/L (ref 0–53)
AST: 21 U/L (ref 0–37)
Albumin: 3.9 g/dL (ref 3.5–5.2)
Alkaline Phosphatase: 63 U/L (ref 39–117)
BUN: 40 mg/dL — ABNORMAL HIGH (ref 6–23)
CO2: 28 meq/L (ref 19–32)
Calcium: 9.2 mg/dL (ref 8.4–10.5)
Chloride: 103 meq/L (ref 96–112)
Creatinine, Ser: 1.16 mg/dL (ref 0.40–1.50)
GFR: 54.75 mL/min — ABNORMAL LOW (ref >60.00)
Glucose, Bld: 111 mg/dL — ABNORMAL HIGH (ref 70–99)
Potassium: 3.9 meq/L (ref 3.5–5.1)
Sodium: 140 meq/L (ref 135–145)
Total Bilirubin: 1 mg/dL (ref 0.2–1.2)
Total Protein: 6.6 g/dL (ref 6.0–8.3)

## 2023-10-13 LAB — LIPID PANEL
Cholesterol: 153 mg/dL (ref 0–200)
HDL: 65.3 mg/dL (ref >39.00)
LDL Cholesterol: 78 mg/dL (ref 0–99)
NonHDL: 87.95
Total CHOL/HDL Ratio: 2
Triglycerides: 50 mg/dL (ref 0.0–149.0)
VLDL: 10 mg/dL (ref 0.0–40.0)

## 2023-10-13 MED ORDER — FUROSEMIDE 40 MG PO TABS: 40.0000 mg | ORAL_TABLET | Freq: Every day | ORAL | 3 refills | Status: DC

## 2023-10-13 NOTE — Assessment & Plan Note (Signed)
appropriately anticoagulated with Eliquis 5 mg twice daily and rate controlled with verapamil 240 mg extended release- continue current medicine

## 2023-10-13 NOTE — Patient Instructions (Addendum)
Lets try 2-3 nights of  -try to have consistent bedtime within about 15 minutes for next few days - stop ALL screens 30 minutes before bed- do something without screens that is not too mentally stimulating- leisure reading, folding socks, time with pets - at the same time you stop screens start to turn the lights down -consider black out shades or sleep mask such as [place] bear mask for about $10 on [place] -also at the same time take melatonin 1-3 mg - if not doing better after by Thursday or Friday let me know- we can look at alternate medicines like trazodone   Please stop by lab before you go If you have mychart- we will send your results within 3 business days of [place] receiving them.  If you do not have mychart- we will call you about results within 5 business days of [place] receiving them.  *please also note that you will see labs on mychart as soon as they post. I will later go in and write notes on them- will say "notes from Dr. Durene Cal"   Recommended follow up: Return for next already scheduled visit or sooner if needed.

## 2023-10-13 NOTE — Assessment & Plan Note (Signed)
Blood pressure is well-controlled with furosemide 40 mg plus verapamil 240 mg extended release-continue current medication

## 2023-10-13 NOTE — Assessment & Plan Note (Signed)
Suspect stable.  LDL slightly above goal but can work on mild lifestyle changes

## 2023-10-13 NOTE — Assessment & Plan Note (Signed)
-  New diagnosis  -Extended counseling provided today including indications to seek care such as 3 pounds weight gain in the day or 5 pounds in a week or less, progressive edema or shortness of breath.  This has been extremely distressing for patient.  He appears euvolemic and I encouraged him that he was in a much better spot than prehospitalization. - Continue Lasix 40 mg daily.   -Labs do not show hypokalemia but offering 2 to 4-week repeat I suspect he may eventually need potassium -He reflects back and reports under stress such as a quick position change or working in the yard occasionally he had mild hallucinations of seeing something that was not there prior to hospitalization-he reports this has been better since hospitalization-I wonder if this could have been related to fluid overload -I did encourage him to keep follow-up with Dr. Graciela Husbands of cardiology on December 21, 2023-we did discuss if he has more substantial issues with heart failure we could always consider heart failure clinic as well.  He monitors with Dr. Graciela Husbands for his pacemaker. -With opacity and pleural effusion on x-ray as noted above-May consider repeat chest x-ray after December visit

## 2023-10-13 NOTE — Assessment & Plan Note (Signed)
Appears to have actually mildly improved since leaving hospitalization-continue to monitor

## 2023-10-13 NOTE — Progress Notes (Signed)
Phone [phone]   Subjective:  Eric Lambert is a 89 y.o. year old very pleasant male patient who presents for transitional care management and hospital follow up for acute on chronic diastolic congestive heart failure. Patient was hospitalized from October 05, 2023 to October 08, 2023. A TCM phone call was attempted Today's TOC FU Call Status:: Unsuccessful Call (3rd Attempt) Unsuccessful Call (1st Attempt) Date: 10/09/23 Unsuccessful Call (2nd Attempt) Date: 10/12/23 Unsuccessful Call (3rd Attempt) Date: 10/12/23. Medical complexity moderate   Patient reported to the hospital with dyspnea on exertion and inability to sleep due to shortness of breath.  On initial exam noted to have bilateral rails with dullness to percussion on the bases without wheezing and initial BNP was elevated to 229.  Chest radiograph showed hyperinflation, positive bilateral hilar vascular congestion and bilateral pleural effusions right greater than left.  CT of the chest was done which showed faint bilateral groundglass opacities, bilateral pleural effusions right greater than left and no pulmonary embolism (D-dimer mildly elevated at 0.86).  They also perform CT of the head which showed chronic microvascular disease and no acute intracranial abnormalities.  EKG performed showing ventricular paced rhythm with rate of 60 and no significant ST or T wave changes.  Troponin trend was flat/steady at 33.  White blood count was not elevated.  Concern was for fluid overload and patient was started on furosemide for diuresis.  With diuresis patient showed significant improvement in dyspnea on exertion.  Plan was for ongoing outpatient furosemide treatment.  Echocardiogram showed from discharge summary " preserved LV systolic function EF 60 to 65%, mild dilated cavity, no LVH, RV systolic function preserved, RVSP 36.2 mmHg, left and right atrium with severe dilatation, moderate mitral valve regurgitation," -Repeat chest  x-ray before discharge showed persistent small pleural effusion but this was an improvement from mild to moderate previously noted. -I independently reviewed this chest x-ray from 10/06/2023.  Normal airway.  Lungs appear hyperinflated.  Normal cardiac silhouette.  Normal diaphragm.  Evidence of pacemaker noted.  Small right-sided pleural effusion and opacity in right lung base  For other chronic medical conditions - Blood pressure was maintained on verapamil plus furosemide - For A-fib he was rate controlled on verapamil and his Eliquis was continued for anticoagulation - Chronic kidney disease stage III-largely stable other than some hypokalemia noted during hospitalization -For GERD was continued on proton pump inhibitors - For OSA-noted that he is no longer on CPAP -does appear needed short term haldol in hospital  Today,  He reports poor sleep for 4 nights since being home, slept in hospital ok but poor sleep day before going in as well. Melatonin has not helped him- 10 mg just last night - back to summertime has noted some hallucinations- seems to be somewhat better in recent days- often would happen after getting up or if he was out in yard under some stress.  - has felt more anxious since this diagnosis- especially if he thinks about [place] had never felt like this in past of feeling panicked since being In hospital.  - no edema since being home, no weight gain - appetite low -wife reports he has been down since leaving hospital.  -weight down 5 lbs from last year - weight down 6 lbs from when he had pacemaker replaced -has been able to do some push mower work in yard- does ok with activity but some palpitations when stopped   See problem oriented charting as well  Past Medical History-  Patient Active Problem List   Diagnosis Date Noted   Chronic diastolic CHF (congestive heart failure) (HCC) 10/05/2023    Priority: High   Pleural effusion 10/05/2023    Priority: High    PPM-[employer] 02/07/2009    Priority: High   Permanent atrial fibrillation (HCC) 10/21/2007    Priority: High   Chronic kidney disease, stage 3b (HCC) 10/07/2023    Priority: Medium    Hyperglycemia 11/07/2022    Priority: Medium    Esophageal obstruction due to food impaction     Priority: Medium    Aortic atherosclerosis (HCC) 08/13/2020    Priority: Medium    BPPV (benign paroxysmal positional vertigo) 12/10/2017    Priority: Medium    Bradycardia 02/02/2013    Priority: Medium    Thrombocytopenia (HCC) 08/04/2011    Priority: Medium    Obstructive sleep apnea 10/21/2007    Priority: Medium    Essential hypertension 10/21/2007    Priority: Medium    GERD 10/21/2007    Priority: Medium    BPH associated with nocturia 10/21/2007    Priority: Medium    Erectile dysfunction 05/28/2016    Priority: Low   Macular degeneration 05/28/2016    Priority: Low   Right hip pain 08/04/2013    Priority: Low   Secondary cardiomyopathy (HCC) 02/07/2009    Priority: Low   Osteopenia 10/22/2007    Priority: Low   SUBACUTE BACTERIAL ENDOCARDITIS 10/21/2007    Priority: Low   S/P total left hip arthroplasty 01/07/2022    Priority: 1.   Degenerative disc disease, lumbar 07/18/2019    Priority: 1.   Chronic low back pain 06/09/2018    Priority: 1.   Right knee pain 06/02/2017    Priority: 1.   NSVT (nonsustained ventricular tachycardia) (HCC) 04/30/2023    Medications- reviewed and updated  A medical reconciliation was performed comparing current medicines to hospital discharge medications. Current Outpatient Medications  Medication Sig Dispense Refill   apixaban (ELIQUIS) 5 MG TABS tablet Take 1 tablet by mouth twice daily 180 tablet 1   Cholecalciferol (VITAMIN D-3 PO) Take 1 tablet by mouth daily.     Cyanocobalamin (VITAMIN B-12 PO) Take 2 tablets by mouth daily.     famotidine (PEPCID) 40 MG tablet Take 1 tablet (40 mg total) by mouth at bedtime. 30 tablet 1    furosemide (LASIX) 40 MG tablet Take 1 tablet (40 mg total) by mouth daily. 30 tablet 0   Multiple Vitamins-Minerals ([place] EYE HEALTH FORMULA PO) Take 1 tablet by mouth in the morning and at bedtime.     omeprazole (PRILOSEC) 40 MG capsule TAKE 1 CAPSULE BY MOUTH TWICE DAILY 30-60 MINUTES BEFORE MEALS (BREAKFAST AND DINNER) 180 capsule 1   verapamil (CALAN-SR) 240 MG CR tablet Take 1 tablet by mouth once daily 90 tablet 3   No current facility-administered medications for this visit.   Objective  Objective:  BP 116/64   Pulse 68   Temp 97.6 F (36.4 C)   Ht 5\' 10"  (1.778 m)   Wt 149 lb (67.6 kg)   SpO2 97%   BMI 21.38 kg/m  Gen: NAD, resting comfortably CV: RRR no murmurs rubs or gallops Lungs: CTAB but perhaps mildly decreased breath sounds at bilateral bases Ext: no edema Skin: warm, dry    Assessment and Plan:   Assessment & Plan Chronic diastolic congestive heart failure (HCC) -New diagnosis  -Extended counseling provided today including indications to seek care such as 3 pounds weight  gain in the day or 5 pounds in a week or less, progressive edema or shortness of breath.  This has been extremely distressing for patient.  He appears euvolemic and I encouraged him that he was in a much better spot than prehospitalization. - Continue Lasix 40 mg daily.   -Labs do not show hypokalemia but offering 2 to 4-week repeat I suspect he may eventually need potassium -He reflects back and reports under stress such as a quick position change or working in the yard occasionally he had mild hallucinations of seeing something that was not there prior to hospitalization-he reports this has been better since hospitalization-I wonder if this could have been related to fluid overload -I did encourage him to keep follow-up with Dr. Graciela Husbands of cardiology on December 21, 2023-we did discuss if he has more substantial issues with heart failure we could always consider heart failure clinic as well.  He  monitors with Dr. Graciela Husbands for his pacemaker. -With opacity and pleural effusion on x-ray as noted above-May consider repeat chest x-ray after December visit Hyperlipidemia, unspecified hyperlipidemia type LDL slightly elevated in light of aortic atherosclerosis-at his age and with recent hospitalization-I do not feel like the prior he is to start medicine and LDL only minimally elevated at 78-continue to monitor for now Essential hypertension Blood pressure is well-controlled with furosemide 40 mg plus verapamil 240 mg extended release-continue current medication Permanent atrial fibrillation (HCC) appropriately anticoagulated with Eliquis 5 mg twice daily and rate controlled with verapamil 240 mg extended release- continue current medicine  Chronic kidney disease, stage 3b (HCC) Appears to have actually mildly improved since leaving hospitalization-continue to monitor Adjustment insomnia With poor sleep since being home from the hospital.  Has tried melatonin 10 mg right at bedtime last night with little benefit.  I did extensive counseling on how to use melatonin appropriately and suggested for a lower dose in the 1 to 3 mg range - With distress he has had from recent illness I do suspect he may need something stronger than this-we discussed considering trazodone if he is not sleeping better by the end of the week-he may call in on Thursday or Friday Aortic atherosclerosis (HCC) Suspect stable.  LDL slightly above goal but can work on mild lifestyle changes Thrombocytopenia (HCC) Very mild but noted-rest of cell lines reassuring-continue to monitor  Recommended follow up: Return for next already scheduled visit or sooner if needed. Future Appointments  Date Time Provider Department Center  10/14/2023  2:00 PM [facility] DEVICE 1 [facility]  11/23/2023  1:20 PM [name], MD [facility]  12/18/2023  7:00 AM [facility] DEVICE REMOTES [facility]  12/21/2023   2:45 PM Duke Salvia, MD [facility]  03/18/2024  7:00 AM [facility] DEVICE REMOTES [facility]  03/28/2024 11:00 AM [facility] PHLEBOTOMIST [facility] None  03/28/2024 11:40 AM Ladene Artist, MD [facility] None  06/17/2024  7:00 AM [facility] DEVICE REMOTES [facility]  09/16/2024  7:00 AM [facility] DEVICE REMOTES [facility]  12/16/2024  7:00 AM [facility] DEVICE REMOTES [facility]  03/17/2025  7:00 AM [facility] DEVICE REMOTES [facility]  06/16/2025  7:00 AM [facility] DEVICE REMOTES [facility]  09/15/2025  7:00 AM [facility] DEVICE REMOTES [facility]    Lab/Order associations:   ICD-10-CM   1. Chronic diastolic congestive heart failure (HCC)  I50.32 Comprehensive metabolic panel    CBC with Differential/Platelet    2. Hyperlipidemia, unspecified hyperlipidemia type  E78.5 Lipid panel    3. Essential  hypertension  I10     4. Permanent atrial fibrillation (HCC)  I48.21     5. Chronic kidney disease, stage 3b (HCC)  N18.32     6. Adjustment insomnia  F51.02     7. Aortic atherosclerosis (HCC) Chronic I70.0     8. Thrombocytopenia (HCC) Chronic D69.6       Meds ordered this encounter  Medications   furosemide (LASIX) 40 MG tablet    Sig: Take 1 tablet (40 mg total) by mouth daily.    Dispense:  90 tablet    Refill:  3   Based on time spent during visit would qualify as [phone] if does not qualify as [id number]  Time Spent: 46 minutes of total time (9:35 AM-10:21 AM) was spent on the date of the encounter performing the following actions: chart review prior to seeing the patient including summarizing discharge summary, obtaining history, performing a medically necessary exam, counseling on the diagnosis of heart failure and what to look out for as well as treatment plan, placing orders, and documenting in our EHR.    Return precautions advised.  Tana Conch, MD

## 2023-10-13 NOTE — Assessment & Plan Note (Signed)
Very mild but noted-rest of cell lines reassuring-continue to monitor

## 2023-10-14 ENCOUNTER — Ambulatory Visit: Payer: Medicare HMO | Attending: Cardiology

## 2023-10-14 ENCOUNTER — Telehealth: Payer: Self-pay

## 2023-10-14 DIAGNOSIS — Z950 Presence of cardiac pacemaker: Secondary | ICD-10-CM | POA: Diagnosis not present

## 2023-10-14 DIAGNOSIS — I4811 Longstanding persistent atrial fibrillation: Secondary | ICD-10-CM

## 2023-10-14 LAB — CUP PACEART INCLINIC DEVICE CHECK
Brady Statistic RV Percent Paced: 52 %
Date Time Interrogation Session: 20241120155642
Implantable Lead Connection Status: 753985
Implantable Lead Connection Status: 753985
Implantable Lead Implant Date: 19960809
Implantable Lead Implant Date: 19960809
Implantable Lead Location: 753859
Implantable Lead Location: 753860
Implantable Lead Model: 4285
Implantable Lead Serial Number: 209144
Implantable Pulse Generator Implant Date: 20241023
Lead Channel Impedance Value: 800 Ohm
Lead Channel Pacing Threshold Amplitude: 0.9 V
Lead Channel Pacing Threshold Pulse Width: 0.4 ms
Lead Channel Sensing Intrinsic Amplitude: 11 mV
Lead Channel Setting Pacing Amplitude: 2.5 V
Lead Channel Setting Pacing Pulse Width: 0.4 ms
Lead Channel Setting Sensing Sensitivity: 2.5 mV
Pulse Gen Serial Number: 924391
Zone Setting Status: 755011

## 2023-10-14 NOTE — Progress Notes (Signed)
Pacemaker check in clinic. Normal RV lead function. RA lead no longer in use. Thresholds, sensing, impedance's consistent with previous measurements. Device programmed to maximize longevity. No mode switch or high ventricular rates noted. Device programmed at appropriate safety margins. Histogram distribution appropriate. Device programmed to optimize intrinsic conduction. Estimated longevity 10.5 years. Patient enrolled in remote follow-up. Patient education completed.  Dermabond removed from patient's incision site without complication, and patient tolerated well. Site WNL and without s/s of infection. Generator change only. No arm restrictions in place. Wound care instructions given.

## 2023-10-14 NOTE — Patient Instructions (Signed)
   After Your Pacemaker   Monitor your pacemaker site for redness, swelling, and drainage. Call the device clinic at [phone] if you experience these symptoms or fever/chills.  Your incision was closed with Dermabond:  You may shower 1 day after your defibrillator implant and wash your incision with soap and water. Avoid lotions, ointments, or perfumes over your incision until it is well-healed.  You may use a hot tub or a pool after your wound check appointment if the incision is completely closed.  There are no other restrictions in arm movement after your wound check appointment.  You may drive, unless driving has been restricted by your healthcare providers.  Remote monitoring is used to monitor your pacemaker from home. This monitoring is scheduled every 91 days by our office. It allows us to keep an eye on the functioning of your device to ensure it is working properly. You will routinely see your Electrophysiologist annually (more often if necessary).  

## 2023-10-14 NOTE — Transitions of Care (Post Inpatient/ED Visit) (Signed)
   10/14/2023  Name: Eric Lambert MRN: [id number] DOB: 1929/02/15  Today's TOC FU Call Status: Today's TOC FU Call Status::  (Made a 4th attempt to outreach post discharge on 11/14 as patient had returned a call and left a VM with RN Care Coordinator. Unfortuantely, patient was also at a device appointment at the time of this 4th call but a VM was left with CB number.)  Attempted to reach the patient regarding the most recent Inpatient/ED visit. 4th attempt due patient trying to return a call and leaving a voice mail.   Follow Up Plan: Additional outreach attempts will be made to reach the patient to complete the Transitions of Care (Post Inpatient/ED visit) call.  Patient had attempted to call [facility] RN Care Coordinator back and left a VM. This RN CM attempted a 4th call and had to leave a VM also, with practice RN Care Coordinator phone number [phone]. It was noted that at the same time this 4th call was attempted, patient was noted in EMR to be at a device appointment.    Gabriel Cirri MSN, RN [place]  [facility] Management Coordinator [email or web address] Direct Dial: [phone] Fax: [phone]

## 2023-10-14 NOTE — Telephone Encounter (Signed)
While in for 14 day post op gen change wound/device check, patient expresses his concern over recent new dx of CHF.  He has a lot of concerns and questions regarding his condition.  He would like Dr. Odessa Fleming input on need for referral to heart failure clinic.  Dr. Durene Cal suggested it and he would like to go if Dr. Graciela Husbands feels is appropriate.    Forwarding for Dr. Odessa Fleming review and agreement with referral if needed.

## 2023-10-15 ENCOUNTER — Telehealth: Payer: Self-pay

## 2023-10-15 NOTE — Transitions of Care (Post Inpatient/ED Visit) (Signed)
10/15/2023  Name: Eric Lambert MRN: [id number] DOB: 06-24-1929  Today's TOC FU Call Status: Today's TOC FU Call Status:: Successful TOC FU Call Completed TOC FU Call Complete Date: 10/15/23 Patient declined follow up calls; 30 day program.  Patient's Name and Date of Birth confirmed.  Transition Care Management Follow-up Telephone Call Date of Discharge: 10/08/23 Discharge Facility: [facility]) Type of Discharge: Inpatient Admission How have you been since you were released from the hospital?: Better Any questions or concerns?: Yes Patient Questions/Concerns:: Concerns about new diagnosis of Acute on Chronic Heart Failure Patient Questions/Concerns Addressed: Provided Patient Educational Materials (Verbally reviewed HF hand book he said he did not receive on discharge.)  Items Reviewed: Did you receive and understand the discharge instructions provided?: Yes Medications obtained,verified, and reconciled?: Yes (Medications Reviewed) Any new allergies since your discharge?: No Dietary orders reviewed?: Yes Type of Diet Ordered:: Heart Healthy Low Na Do you have support at home?: Yes People in Home: spouse Name of Support/Comfort Primary Source: Martucci,Janet (Spouse)  [phone] (Home Phone)  Medications Reviewed Today: Medications Reviewed Today     Reviewed by Bing Quarry, RN (Case Manager) on 10/15/23 at 1221  Med List Status: <None>   Medication Order Taking? Sig Documenting Provider Last Dose Status Informant  apixaban (ELIQUIS) 5 MG TABS tablet [id number] Yes Take 1 tablet by mouth twice daily Duke Salvia, MD Taking Active Self, Pharmacy Records  Cholecalciferol (VITAMIN D-3 PO) [id number] Yes Take 1 tablet by mouth daily. [provider] Taking Active Self, Pharmacy Records  Cyanocobalamin (VITAMIN B-12 PO) [id number] Yes Take 2 tablets by mouth daily. [provider] Taking Active Self, Pharmacy Records  famotidine (PEPCID) 40 MG  tablet [id number] Yes Take 1 tablet (40 mg total) by mouth at bedtime. Unk Lightning, [place] Taking Active Self, Pharmacy Records  furosemide (LASIX) 40 MG tablet [id number] Yes Take 1 tablet (40 mg total) by mouth daily. [name], MD Taking Active   Multiple Vitamins-Minerals [facility] EYE HEALTH FORMULA PO) [id number] Yes Take 1 tablet by mouth in the morning and at bedtime. [provider] Taking Active Self, Pharmacy Records  omeprazole (PRILOSEC) 40 MG capsule [id number] Yes TAKE 1 CAPSULE BY MOUTH TWICE DAILY 30-60 MINUTES BEFORE MEALS (BREAKFAST AND DINNER) Zerita Boers, Violet Baldy, [place] Taking Active   verapamil (CALAN-SR) 240 MG CR tablet [id number] Yes Take 1 tablet by mouth once daily Duke Salvia, MD Taking Active Self, Pharmacy Records            Home Care and Equipment/Supplies: Were Home Health Services Ordered?: No Any new equipment or medical supplies ordered?: No  Functional Questionnaire: Do you need assistance with bathing/showering or dressing?: No Do you need assistance with meal preparation?: No Do you need assistance with eating?: No (poor appetite, food feels like it grows in his mouth.) Do you have difficulty maintaining continence: No Do you need assistance with getting out of bed/getting out of a chair/moving?: No Do you have difficulty managing or taking your medications?: No  Follow up appointments reviewed: PCP Follow-up appointment confirmed?: Yes Date of PCP follow-up appointment?: 10/13/23 Follow-up Provider: Earnstine Regal Follow-up appointment confirmed?: Yes Date of Specialist follow-up appointment?: 12/21/23 (Had Pacemaker check 10/14/23. See cardiologist and a pacemaker check schedule for 12/21/23 at 245 pm.) Follow-Up Specialty Provider:: Dr Sherryl Manges Do you need transportation to your follow-up appointment?: No Do you understand care options if your condition(s) worsen?: Yes-patient verbalized  understanding  SDOH Interventions Today  Flowsheet Row Most Recent Value  SDOH Interventions   Food Insecurity Interventions Intervention Not Indicated  Housing Interventions Intervention Not Indicated  Transportation Interventions Intervention Not Indicated  Utilities Interventions Intervention Not Indicated  Health Literacy Interventions Intervention Not Indicated     Patient and RN CM discussed discharge diagnosis and acute on chronic heart failure and reviewed the After Visit Summary, medications, allergies, SDOH needs, and reviewed patient education and provider discharge instructions on heart failure. Patient had not been weighing self daily though he does have a scale. Discussed importance of weighing and logging at the same time each day and when to call provider. Reviewed the green, yellow and red zones of self reported symptoms and when to call provider or seek emergency attention. Patient discussed some feelings of need to accomplish things urgently, not sleeping well since being home, and "seeing things" while in hospital setting. He stated he slept well at the hospital but now is having trouble. He also reported poor appetite with the feeling of the food being chewed and chewed then then an effort to swallow--though he feels this is appetite related not chewing or swallowing issues. He expresses some possible anxiety issues but will address with PCP when ambulatory referral to LCSW was offered. He has requested a referral to Heart Failure Clinic from cardiologist and RN CM discussed what HF clinic can offer depending on his particular situation.   Patient declined further follow up calls but stated he did feel better after the conversation today with RN CM TOC follow up. Of note, no ACP documents on file in EPIC. Patient decided when to end the call.   Gabriel Cirri MSN, RN [place]  [facility] Management Coordinator [email or web address] Direct Dial:  [phone] Fax: [phone]

## 2023-10-15 NOTE — Patient Instructions (Signed)
Visit Information  Thank you for taking time to visit with me today. Please don't hesitate to contact me if I can be of assistance to you before our next scheduled telephone appointment.  Our next appointment is  Patient declined further follow up via 30 day program   Following is a copy of your care plan:   Goals Addressed   None   Discussed recent diagnosis and self care management at home for heart failure. Patient to begin daily weights and logging to monitor and to take to provider. Reviewed AVS and discharge instructions along with TOC follow up outreach assessment. Reviewed HF hand book including symptom zone reporting.   Patient verbalizes understanding of instructions and care plan provided today and agrees to view in MyChart. Active MyChart status and patient understanding of how to access instructions and care plan via MyChart confirmed with patient.     No further follow up required: Patient declined 30 day follow up program at this time.   Please call the care guide team at [phone] if you need to obtain, cancel or reschedule a follow up appointment.   Required:  Please call the [place] National Suicide Prevention Lifeline: (470)194-7753 or TTY: 4583816141 TTY [phone]) to talk to a trained counselor call 1-800-273-TALK (toll free, 24 hour hotline) if you are experiencing a Mental Health or Behavioral Health Crisis or need someone to talk to.  Gabriel Cirri MSN, RN [place]  [facility] Management Coordinator [email or web address] Direct Dial: [phone] Fax: [phone]

## 2023-10-19 ENCOUNTER — Telehealth: Payer: Self-pay | Admitting: Family Medicine

## 2023-10-19 ENCOUNTER — Encounter (HOSPITAL_COMMUNITY): Payer: Self-pay | Admitting: Emergency Medicine

## 2023-10-19 ENCOUNTER — Ambulatory Visit (HOSPITAL_COMMUNITY)
Admission: EM | Admit: 2023-10-19 | Discharge: 2023-10-19 | Disposition: A | Discharge status: Home / Self Care | Payer: Medicare HMO | Attending: Family Medicine | Admitting: Family Medicine

## 2023-10-19 DIAGNOSIS — F419 Anxiety disorder, unspecified: Secondary | ICD-10-CM | POA: Diagnosis not present

## 2023-10-19 DIAGNOSIS — F32A Depression, unspecified: Secondary | ICD-10-CM | POA: Diagnosis not present

## 2023-10-19 MED ORDER — ESCITALOPRAM OXALATE 5 MG PO TABS: 10.0000 mg | ORAL_TABLET | Freq: Every day | ORAL | 1 refills | Status: DC

## 2023-10-19 NOTE — Telephone Encounter (Signed)
FYI. Pt currently in ER.

## 2023-10-19 NOTE — Telephone Encounter (Signed)
FYI

## 2023-10-19 NOTE — Telephone Encounter (Signed)
FYI: This call has been transferred to triage nurse: the Triage Nurse. Once the result note has been entered staff can address the message at that time.  Patient called in with the following symptoms:  Red Word: Chest feels tight, heart is beating too fast-feels like it is racing , cannot catch his breath-difficulty breathing   Please advise at Mobile [phone] (mobile)  Message is routed to Provider Pool.

## 2023-10-19 NOTE — ED Provider Notes (Signed)
[facility]    CSN: [id number] Arrival date & time: 10/19/23  0932      History   Chief Complaint Chief Complaint  Patient presents with   Cough   Anxiety    HPI Eric Lambert is a 89 y.o. male.   Patient is a pleasant alert and oriented 89 year old male who was recently diagnosed with congestive heart failure and recently discharged from the hospital approximately 10 days ago.  Patient states that he has been having feelings of anxiety as well as some depression like symptoms over the past few weeks.  Patient states that he has had difficulty sleeping, food does not taste as good as it does, and also notes that he wonders if he is suicidal.  Patient has no plan or suicidal ideation or intent at this time.  Patient states that he constantly feels like he needs to be doing something but notes that he cannot because his CHF diagnosis is limited.  Patient has many questions regarding his CHF diagnosis and what he is capable of doing and not doing.   Cough Anxiety    Past Medical History:  Diagnosis Date   Allergy    Arthritis    Atrial fibrillation -permanent    BPH (benign prostatic hyperplasia)    Cancer (HCC)    HX OF SKIN CANCER    CHF (congestive heart failure) (HCC)    resolved after pacemaker - tachycardia induced   Complete heart block (HCC)    Dyspnea    mild   ED (erectile dysfunction)    GERD (gastroesophageal reflux disease)    GLUCOSE INTOLERANCE 10/22/2007   no recent issues   Heart murmur    OSA (obstructive sleep apnea)    NO CPAP    Pacemaker BSX    dual   Pneumonia    HX OF SEVERAL TIMES AS A CHILD    PONV (postoperative nausea and vomiting)    at age 76    Presence of permanent cardiac pacemaker    SUBACUTE BACTERIAL ENDOCARDITIS 1970s    Patient Active Problem List   Diagnosis Date Noted   Chronic kidney disease, stage 3b (HCC) 10/07/2023   Chronic diastolic CHF (congestive heart failure) (HCC) 10/05/2023   Pleural  effusion 10/05/2023   NSVT (nonsustained ventricular tachycardia) (HCC) 04/30/2023   Hyperglycemia 11/07/2022   S/P total left hip arthroplasty 01/07/2022   Esophageal obstruction due to food impaction    Aortic atherosclerosis (HCC) 08/13/2020   Degenerative disc disease, lumbar 07/18/2019   Chronic low back pain 06/09/2018   BPPV (benign paroxysmal positional vertigo) 12/10/2017   Right knee pain 06/02/2017   Erectile dysfunction 05/28/2016   Macular degeneration 05/28/2016   Right hip pain 08/04/2013   Bradycardia 02/02/2013   Thrombocytopenia (HCC) 08/04/2011   Secondary cardiomyopathy (HCC) 02/07/2009   PPM-[employer] 02/07/2009   Osteopenia 10/22/2007   Obstructive sleep apnea 10/21/2007   Essential hypertension 10/21/2007   SUBACUTE BACTERIAL ENDOCARDITIS 10/21/2007   Permanent atrial fibrillation (HCC) 10/21/2007   GERD 10/21/2007   BPH associated with nocturia 10/21/2007    Past Surgical History:  Procedure Laterality Date   ESOPHAGOGASTRODUODENOSCOPY (EGD) WITH PROPOFOL N/A 01/07/2022   Procedure: ESOPHAGOGASTRODUODENOSCOPY (EGD) WITH PROPOFOL;  Surgeon: Beverley Fiedler, MD;  Location: [facility];  Service: Gastroenterology;  Laterality: N/A;   IMPACTION REMOVAL  01/07/2022   Procedure: IMPACTION REMOVAL;  Surgeon: Beverley Fiedler, MD;  Location: [facility];  Service: Gastroenterology;;   INGUINAL HERNIA REPAIR Bilateral 04/08/2021  Procedure: LAPAROSCOPIC BILATERAL INGUINAL HERNIA REPAIR WITH MESH;  Surgeon: Kinsinger, De Blanch, MD;  Location: [facility];  Service: General;  Laterality: Bilateral;   INSERT / REPLACE / REMOVE PACEMAKER     PACEMAKER GENERATOR CHANGE N/A 05/07/2012   Procedure: PACEMAKER GENERATOR CHANGE;  Surgeon: Duke Salvia, MD;  Location: [facility] C[facility] LAB;  Service: Cardiovascular;  Laterality: N/A;   PACEMAKER PLACEMENT     PARTIAL HIP ARTHROPLASTY     2008   PPM GENERATOR CHANGEOUT N/A 09/16/2023   Procedure: PPM GENERATOR CHANGEOUT;  Surgeon:  Duke Salvia, MD;  Location: [facility] INVASIVE CV LAB;  Service: Cardiovascular;  Laterality: N/A;   TOTAL HIP ARTHROPLASTY Left 01/07/2022   Procedure: TOTAL HIP ARTHROPLASTY ANTERIOR APPROACH;  Surgeon: Sheral Apley, MD;  Location: [facility];  Service: Orthopedics;  Laterality: Left;       Home Medications    Prior to Admission medications   Medication Sig Start Date End Date Taking? Authorizing Provider  escitalopram (LEXAPRO) 5 MG tablet Take 2 tablets (10 mg total) by mouth daily. Please take 5 mg of your Lexapro daily for the next 2 weeks, afterwards you may increase to 2 pills a day or 10 mg a day. 10/19/23  Yes Brenton Grills, MD  apixaban (ELIQUIS) 5 MG TABS tablet Take 1 tablet by mouth twice daily 04/07/23   Duke Salvia, MD  Cholecalciferol (VITAMIN D-3 PO) Take 1 tablet by mouth daily.    [provider]  Cyanocobalamin (VITAMIN B-12 PO) Take 2 tablets by mouth daily.    [provider]  famotidine (PEPCID) 40 MG tablet Take 1 tablet (40 mg total) by mouth at bedtime. 12/09/22   Unk Lightning, PA  furosemide (LASIX) 40 MG tablet Take 1 tablet (40 mg total) by mouth daily. 10/13/23 11/12/23  [name], MD  Multiple Vitamins-Minerals (OCUVITE EYE HEALTH FORMULA PO) Take 1 tablet by mouth in the morning and at bedtime.    [provider]  omeprazole (PRILOSEC) 40 MG capsule TAKE 1 CAPSULE BY MOUTH TWICE DAILY 30-60 MINUTES BEFORE MEALS (BREAKFAST AND DINNER) 10/12/23   Unk Lightning, PA  verapamil (CALAN-SR) 240 MG CR tablet Take 1 tablet by mouth once daily 05/06/23   Duke Salvia, MD    Family History Family History  Problem Relation Age of Onset   Prostate cancer Father    Diabetes Brother    Prostate cancer Brother    Brain cancer Brother    Heart disease Other        mothers side men- strokes and heart attacks   Colon cancer Neg Hx    Pancreatic cancer Neg Hx    Stomach cancer Neg Hx     Social History Social  History   Tobacco Use   Smoking status: Never   Smokeless tobacco: Never  Vaping Use   Vaping status: Never Used  Substance Use Topics   Alcohol use: Not Currently    Comment: hx of 10-15 years ago    Drug use: No     Allergies   Patient has no known allergies.   Review of Systems Review of Systems  Respiratory:  Positive for cough.      Physical Exam Triage Vital Signs ED Triage Vitals  Encounter Vitals Group     BP 10/19/23 1024 126/70     Systolic BP Percentile --      Diastolic BP Percentile --      Pulse Rate 10/19/23 1024 64  Resp 10/19/23 1024 18     Temp 10/19/23 1024 (!) 97.5 F (36.4 C)     Temp Source 10/19/23 1024 Oral     SpO2 10/19/23 1024 94 %     Weight --      Height --      Head Circumference --      Peak Flow --      Pain Score 10/19/23 1033 0     Pain Loc --      Pain Education --      Exclude from Growth Chart --    No data found.  Updated Vital Signs BP 126/70   Pulse 64   Temp (!) 97.5 F (36.4 C) (Oral)   Resp 18   SpO2 94%   Visual Acuity Right Eye Distance:   Left Eye Distance:   Bilateral Distance:    Right Eye Near:   Left Eye Near:    Bilateral Near:     Physical Exam Constitutional:      Appearance: Normal appearance.  Cardiovascular:     Rate and Rhythm: Normal rate and regular rhythm.     Pulses: Normal pulses.     Heart sounds: Normal heart sounds.  Neurological:     General: No focal deficit present.     Mental Status: He is alert and oriented to person, place, and time. Mental status is at baseline.     Sensory: No sensory deficit.  Psychiatric:        Mood and Affect: Mood normal.        Behavior: Behavior normal.        Thought Content: Thought content normal.        Judgment: Judgment normal.      UC Treatments / Results  Labs (all labs ordered are listed, but only abnormal results are displayed) Labs Reviewed - No data to display  EKG   Radiology No results  found.  Procedures Procedures (including critical care time)  Medications Ordered in UC Medications - No data to display  Initial Impression / Assessment and Plan / UC Course  I have reviewed the triage vital signs and the nursing notes.  Pertinent labs & imaging results that were available during my care of the patient were reviewed by me and considered in my medical decision making (see chart for details).     I had a lengthy discussion with patient and his wife regarding the way he is feeling.  These do appear to be signs of early depression/anxiety.  After discussion with patient, they would like to try some medication now so that when they follow-up with their PCP they can evaluate if they need to continue the medication.  I advised patient and his wife that these things are usually started by primary care physicians and that close follow-up would be best, patient and his wife would both like to go ahead and start medication now for something.  I also advised patient that there is always a slight risk of suicide when starting antidepressants, both patient's wife are aware.  Will go ahead and do Lexapro 5 mg daily for the next 2 weeks, patient can then increase to 10 mg daily.  Patient does have an appointment with his PCP at the end of December we will try to get one sooner.  Patient otherwise has no other cardiac abnormalities at this time.  I discussed with patient and had information provided regarding his recent CHF diagnosis.  Patient is understanding and agreeable  with plan. Final Clinical Impressions(s) / UC Diagnoses   Final diagnoses:  Anxiety  Depression, unspecified depression type     Discharge Instructions      Please take 5 mg (1 pill) of your Lexapro daily for the next 2 weeks, afterwards you may increase to 2 pills a day.  Please follow-up with your PCP.   You may drink anywhere between 1.5 to 2 L of fluid a day max.  If you find that you are becoming swollen or  short of breath despite this amount of fluid, please stop drinking as much and decreased to 1 L.  I would recommend you continue to do your physical activity as you have been doing as that is beneficial for you and will allow you to tolerate more fluids.     ED Prescriptions     Medication Sig Dispense Auth. Provider   escitalopram (LEXAPRO) 5 MG tablet Take 2 tablets (10 mg total) by mouth daily. Please take 5 mg of your Lexapro daily for the next 2 weeks, afterwards you may increase to 2 pills a day or 10 mg a day. 30 tablet Brenton Grills, MD      PDMP not reviewed this encounter.   Brenton Grills, MD 10/19/23 [phone]

## 2023-10-19 NOTE — Discharge Instructions (Addendum)
Please take 5 mg (1 pill) of your Lexapro daily for the next 2 weeks, afterwards you may increase to 2 pills a day.  Please follow-up with your PCP.   You may drink anywhere between 1.5 to 2 L of fluid a day max.  If you find that you are becoming swollen or short of breath despite this amount of fluid, please stop drinking as much and decreased to 1 L.  I would recommend you continue to do your physical activity as you have been doing as that is beneficial for you and will allow you to tolerate more fluids.

## 2023-10-19 NOTE — ED Triage Notes (Addendum)
Pt c/o cough, a little nauseous, and food not tasting well. Today  presents because he has been having panic attacks and difficulty breathing and sleeping since being in the hospital.  He was seen in ER a few weeks ago an told he had congestive heart failure.This is when his anxiety began and he was given haldol while admitted.

## 2023-10-19 NOTE — Telephone Encounter (Signed)
Patient advised: Go to ED Now  Patient Name First: Eric Last: Lambert Gender: Male DOB: 10/29/1929 Age: 89 Y 3 M 28 D Return Phone Number: [phone] (Primary) Address: City/ State/ Zip: [place]  [address] Client [facility] - [facility], [facility] Provider Tana Conch- MD Contact Type Call Who Is Calling Patient / Member / Family / Caregiver Call Type Triage / Clinical Relationship To Patient Self Return Phone Number [phone] (Primary) Chief Complaint CHEST PAIN - pain, pressure, heaviness or tightness Reason for Call Symptomatic / Request for Health Information Initial Comment Caller states he has difficulty breathing and chest feels tight. Translation No Guidelines Guideline Title Affirmed Question Affirmed Notes Nurse Date/Time Lamount Cohen Time) Chest Pain Difficulty breathing Quentin Cornwall 10/19/2023 9:37:07 AM Disp. Time Lamount Cohen Time) Disposition Final User 10/19/2023 8:54:06 AM Send to Urgent Queue Mylo Red 10/19/2023 9:23:16 AM Attempt made - message left Mordecai Maes 10/19/2023 9:23:30 AM Send To Nurse Hartford Poli, RN, Marie 10/19/2023 9:23:59 AM Send To Nurse Hartford Poli, RN, Marie 10/19/2023 9:24:23 AM Send To RN Personal Carylon Perches, RN, Marie 10/19/2023 9:24:59 AM Send To RN Personal Carylon Perches, RN, Hilda Lias 10/19/2023 9:38:34 AM Go to ED Now Yes Gasper Sells, RN, [name] Final Disposition 10/19/2023 9:38:34 AM Go to ED Now Yes Gasper Sells, RN, Herbert Pun Disagree/Comply Comply Caller Understands Yes PreDisposition Call Doctor Care Advice Given Per Guideline Care Advice Given Per Guideline GO TO ED NOW: * You need to be seen in the Emergency Department. * Go to the ED at ___________ Hospital. * Leave now. Drive carefully. NOTE TO TRIAGER - DRIVING: * Another adult should drive. * Patient should not delay going to the emergency department. * If immediate  transportation is not available via car, rideshare (e.g., Lyft, Uber), or taxi, then the patient should be instructed to call [id number]. CARE ADVICE given per Chest Pain (Adult) guideline. Comments User: Jason Coop, RN Date/Time Lamount Cohen Time): 10/19/2023 9:36:36 AM IT issues. Had to redocument as PC froze. User: Jason Coop, RN Date/Time Lamount Cohen Time): 10/19/2023 9:39:58 AM Pt states he felt better when discharged from hospital. Feels worse now. BP 117/62. P 68 Referrals [facility] - ED

## 2023-10-20 ENCOUNTER — Telehealth: Payer: Self-pay | Admitting: Family Medicine

## 2023-10-20 ENCOUNTER — Telehealth: Payer: Self-pay | Admitting: Internal Medicine

## 2023-10-20 MED ORDER — TRAZODONE HCL 50 MG PO TABS: 25.0000 mg | ORAL_TABLET | Freq: Every evening | ORAL | 3 refills | Status: DC | PRN

## 2023-10-20 NOTE — Addendum Note (Signed)
Addended by: [name] on: 10/20/2023 05:46 PM   Modules accepted: Orders

## 2023-10-20 NOTE — Telephone Encounter (Signed)
Patient called in stating he went to the UC yesterday but he is still having trouble sleeping and eating. States he would like to explore more medications as recommended by PCP following OV on 11/19. Please Advise.

## 2023-10-20 NOTE — Telephone Encounter (Signed)
This has been addressed in a separate encounter.  See that encounter for complete details.

## 2023-10-20 NOTE — Telephone Encounter (Signed)
Pt returning nurses phone call. Please advise ?

## 2023-10-20 NOTE — Telephone Encounter (Signed)
He can try half a tablet of trazodone to start for the next few days and if no improvement may take full tablet-I sent this in

## 2023-10-20 NOTE — Telephone Encounter (Signed)
Fyi.

## 2023-10-20 NOTE — Telephone Encounter (Signed)
Attempted phone call to pt.  OK per Epic to leave detailed message.  Pt advised per Dr Graciela Husbands he does not feel a referral to heart failure clinic is necessary at this time.  Pt advised to call [phone] with any further questions.

## 2023-10-21 NOTE — Telephone Encounter (Signed)
Called and lm on pt vm with below message.

## 2023-10-23 ENCOUNTER — Other Ambulatory Visit: Payer: Self-pay | Admitting: Internal Medicine

## 2023-10-23 DIAGNOSIS — I4821 Permanent atrial fibrillation: Secondary | ICD-10-CM

## 2023-10-26 DIAGNOSIS — J45909 Unspecified asthma, uncomplicated: Secondary | ICD-10-CM | POA: Diagnosis not present

## 2023-10-26 DIAGNOSIS — K219 Gastro-esophageal reflux disease without esophagitis: Secondary | ICD-10-CM | POA: Diagnosis not present

## 2023-10-26 DIAGNOSIS — N400 Enlarged prostate without lower urinary tract symptoms: Secondary | ICD-10-CM | POA: Diagnosis not present

## 2023-10-26 DIAGNOSIS — R32 Unspecified urinary incontinence: Secondary | ICD-10-CM | POA: Diagnosis not present

## 2023-10-26 DIAGNOSIS — N529 Male erectile dysfunction, unspecified: Secondary | ICD-10-CM | POA: Diagnosis not present

## 2023-10-26 DIAGNOSIS — H3530 Unspecified macular degeneration: Secondary | ICD-10-CM | POA: Diagnosis not present

## 2023-10-26 DIAGNOSIS — K5900 Constipation, unspecified: Secondary | ICD-10-CM | POA: Diagnosis not present

## 2023-10-26 DIAGNOSIS — M545 Low back pain, unspecified: Secondary | ICD-10-CM | POA: Diagnosis not present

## 2023-10-26 DIAGNOSIS — I2510 Atherosclerotic heart disease of native coronary artery without angina pectoris: Secondary | ICD-10-CM | POA: Diagnosis not present

## 2023-10-26 DIAGNOSIS — Z008 Encounter for other general examination: Secondary | ICD-10-CM | POA: Diagnosis not present

## 2023-10-26 DIAGNOSIS — E785 Hyperlipidemia, unspecified: Secondary | ICD-10-CM | POA: Diagnosis not present

## 2023-10-26 DIAGNOSIS — F419 Anxiety disorder, unspecified: Secondary | ICD-10-CM | POA: Diagnosis not present

## 2023-10-26 DIAGNOSIS — M1990 Unspecified osteoarthritis, unspecified site: Secondary | ICD-10-CM | POA: Diagnosis not present

## 2023-10-26 NOTE — Telephone Encounter (Signed)
Prescription refill request for Eliquis received. Indication:afib Last office visit:10/24 Scr:1.16  11/24 Age: 89 Weight:67.6  kg  Prescription refilled

## 2023-10-28 ENCOUNTER — Ambulatory Visit: Payer: Self-pay

## 2023-10-28 ENCOUNTER — Inpatient Hospital Stay (HOSPITAL_COMMUNITY)
Admission: EM | Admit: 2023-10-28 | Discharge: 2023-11-03 | DRG: 291 | Disposition: A | Discharge status: Home / Self Care | Payer: Medicare HMO | Attending: Family Medicine | Admitting: Family Medicine

## 2023-10-28 ENCOUNTER — Encounter (HOSPITAL_COMMUNITY): Payer: Self-pay | Admitting: Emergency Medicine

## 2023-10-28 ENCOUNTER — Telehealth: Payer: Self-pay | Admitting: Family Medicine

## 2023-10-28 ENCOUNTER — Other Ambulatory Visit: Payer: Self-pay

## 2023-10-28 ENCOUNTER — Emergency Department (HOSPITAL_COMMUNITY): Payer: Medicare HMO

## 2023-10-28 DIAGNOSIS — I428 Other cardiomyopathies: Secondary | ICD-10-CM | POA: Diagnosis present

## 2023-10-28 DIAGNOSIS — I442 Atrioventricular block, complete: Secondary | ICD-10-CM | POA: Diagnosis present

## 2023-10-28 DIAGNOSIS — J9811 Atelectasis: Secondary | ICD-10-CM | POA: Diagnosis present

## 2023-10-28 DIAGNOSIS — I341 Nonrheumatic mitral (valve) prolapse: Secondary | ICD-10-CM | POA: Diagnosis present

## 2023-10-28 DIAGNOSIS — I5033 Acute on chronic diastolic (congestive) heart failure: Principal | ICD-10-CM | POA: Diagnosis present

## 2023-10-28 DIAGNOSIS — Z96642 Presence of left artificial hip joint: Secondary | ICD-10-CM | POA: Diagnosis present

## 2023-10-28 DIAGNOSIS — Z8249 Family history of ischemic heart disease and other diseases of the circulatory system: Secondary | ICD-10-CM

## 2023-10-28 DIAGNOSIS — N1832 Chronic kidney disease, stage 3b: Secondary | ICD-10-CM | POA: Diagnosis present

## 2023-10-28 DIAGNOSIS — I4821 Permanent atrial fibrillation: Secondary | ICD-10-CM | POA: Diagnosis present

## 2023-10-28 DIAGNOSIS — Z8042 Family history of malignant neoplasm of prostate: Secondary | ICD-10-CM

## 2023-10-28 DIAGNOSIS — R0602 Shortness of breath: Secondary | ICD-10-CM | POA: Diagnosis not present

## 2023-10-28 DIAGNOSIS — I130 Hypertensive heart and chronic kidney disease with heart failure and stage 1 through stage 4 chronic kidney disease, or unspecified chronic kidney disease: Principal | ICD-10-CM | POA: Diagnosis present

## 2023-10-28 DIAGNOSIS — Z950 Presence of cardiac pacemaker: Secondary | ICD-10-CM

## 2023-10-28 DIAGNOSIS — Z79899 Other long term (current) drug therapy: Secondary | ICD-10-CM

## 2023-10-28 DIAGNOSIS — Z833 Family history of diabetes mellitus: Secondary | ICD-10-CM

## 2023-10-28 DIAGNOSIS — R7982 Elevated C-reactive protein (CRP): Secondary | ICD-10-CM | POA: Diagnosis present

## 2023-10-28 DIAGNOSIS — G4733 Obstructive sleep apnea (adult) (pediatric): Secondary | ICD-10-CM | POA: Diagnosis present

## 2023-10-28 DIAGNOSIS — K760 Fatty (change of) liver, not elsewhere classified: Secondary | ICD-10-CM | POA: Diagnosis present

## 2023-10-28 DIAGNOSIS — J918 Pleural effusion in other conditions classified elsewhere: Secondary | ICD-10-CM | POA: Diagnosis present

## 2023-10-28 DIAGNOSIS — K219 Gastro-esophageal reflux disease without esophagitis: Secondary | ICD-10-CM | POA: Diagnosis present

## 2023-10-28 DIAGNOSIS — N179 Acute kidney failure, unspecified: Secondary | ICD-10-CM | POA: Diagnosis present

## 2023-10-28 DIAGNOSIS — R918 Other nonspecific abnormal finding of lung field: Secondary | ICD-10-CM | POA: Diagnosis not present

## 2023-10-28 DIAGNOSIS — I110 Hypertensive heart disease with heart failure: Secondary | ICD-10-CM | POA: Diagnosis not present

## 2023-10-28 DIAGNOSIS — R0989 Other specified symptoms and signs involving the circulatory and respiratory systems: Secondary | ICD-10-CM | POA: Diagnosis not present

## 2023-10-28 DIAGNOSIS — Z85828 Personal history of other malignant neoplasm of skin: Secondary | ICD-10-CM

## 2023-10-28 DIAGNOSIS — E860 Dehydration: Secondary | ICD-10-CM | POA: Diagnosis not present

## 2023-10-28 DIAGNOSIS — J90 Pleural effusion, not elsewhere classified: Secondary | ICD-10-CM | POA: Diagnosis present

## 2023-10-28 DIAGNOSIS — I495 Sick sinus syndrome: Secondary | ICD-10-CM | POA: Diagnosis present

## 2023-10-28 DIAGNOSIS — I2510 Atherosclerotic heart disease of native coronary artery without angina pectoris: Secondary | ICD-10-CM | POA: Diagnosis present

## 2023-10-28 DIAGNOSIS — E876 Hypokalemia: Secondary | ICD-10-CM | POA: Diagnosis present

## 2023-10-28 DIAGNOSIS — F32A Depression, unspecified: Secondary | ICD-10-CM | POA: Diagnosis present

## 2023-10-28 DIAGNOSIS — I5082 Biventricular heart failure: Secondary | ICD-10-CM | POA: Diagnosis present

## 2023-10-28 DIAGNOSIS — Z7901 Long term (current) use of anticoagulants: Secondary | ICD-10-CM

## 2023-10-28 DIAGNOSIS — N400 Enlarged prostate without lower urinary tract symptoms: Secondary | ICD-10-CM | POA: Diagnosis present

## 2023-10-28 DIAGNOSIS — Z808 Family history of malignant neoplasm of other organs or systems: Secondary | ICD-10-CM

## 2023-10-28 DIAGNOSIS — I509 Heart failure, unspecified: Secondary | ICD-10-CM | POA: Diagnosis not present

## 2023-10-28 LAB — CBC WITH DIFFERENTIAL/PLATELET
Abs Immature Granulocytes: 0.02 10*3/uL (ref 0.00–0.07)
Basophils Absolute: 0 10*3/uL (ref 0.0–0.1)
Basophils Relative: 0 %
Eosinophils Absolute: 0.1 10*3/uL (ref 0.0–0.5)
Eosinophils Relative: 2 %
HCT: 45.5 % (ref 39.0–52.0)
Hemoglobin: 14.8 g/dL (ref 13.0–17.0)
Immature Granulocytes: 0 %
Lymphocytes Relative: 19 %
Lymphs Abs: 1.4 10*3/uL (ref 0.7–4.0)
MCH: 29.9 pg (ref 26.0–34.0)
MCHC: 32.5 g/dL (ref 30.0–36.0)
MCV: 91.9 fL (ref 80.0–100.0)
Monocytes Absolute: 0.6 10*3/uL (ref 0.1–1.0)
Monocytes Relative: 9 %
Neutro Abs: 5.2 10*3/uL (ref 1.7–7.7)
Neutrophils Relative %: 70 %
Platelets: 128 10*3/uL — ABNORMAL LOW (ref 150–400)
RBC: 4.95 MIL/uL (ref 4.22–5.81)
RDW: 15.9 % — ABNORMAL HIGH (ref 11.5–15.5)
WBC: 7.4 10*3/uL (ref 4.0–10.5)
nRBC: 0 % (ref 0.0–0.2)

## 2023-10-28 LAB — COMPREHENSIVE METABOLIC PANEL
ALT: 28 U/L (ref 0–44)
AST: 28 U/L (ref 15–41)
Albumin: 3.8 g/dL (ref 3.5–5.0)
Alkaline Phosphatase: 79 U/L (ref 38–126)
Anion gap: 13 (ref 5–15)
BUN: 36 mg/dL — ABNORMAL HIGH (ref 8–23)
CO2: 25 mmol/L (ref 22–32)
Calcium: 8.9 mg/dL (ref 8.9–10.3)
Chloride: 103 mmol/L (ref 98–111)
Creatinine, Ser: 1.05 mg/dL (ref 0.61–1.24)
GFR, Estimated: >60 mL/min (ref >60)
Glucose, Bld: 92 mg/dL (ref 70–99)
Potassium: 3.7 mmol/L (ref 3.5–5.1)
Sodium: 141 mmol/L (ref 135–145)
Total Bilirubin: 1.4 mg/dL — ABNORMAL HIGH (ref <1.2)
Total Protein: 6.9 g/dL (ref 6.5–8.1)

## 2023-10-28 LAB — BRAIN NATRIURETIC PEPTIDE: B Natriuretic Peptide: 243.6 pg/mL — ABNORMAL HIGH (ref 0.0–100.0)

## 2023-10-28 MED ORDER — ACETAMINOPHEN 650 MG RE SUPP: 650.0000 mg | Freq: Four times a day (QID) | RECTAL | Status: DC | PRN

## 2023-10-28 MED ORDER — ALBUTEROL SULFATE (2.5 MG/3ML) 0.083% IN NEBU: 2.5000 mg | INHALATION_SOLUTION | RESPIRATORY_TRACT | Status: DC | PRN

## 2023-10-28 MED ORDER — FAMOTIDINE 20 MG PO TABS
40.0000 mg | ORAL_TABLET | Freq: Every day | ORAL | Status: DC
  Administered 2023-10-28 – 2023-11-02 (×6): 40 mg via ORAL
  Filled 2023-10-28 (×6): qty 2

## 2023-10-28 MED ORDER — APIXABAN 5 MG PO TABS
5.0000 mg | ORAL_TABLET | Freq: Two times a day (BID) | ORAL | Status: DC
  Administered 2023-10-28 – 2023-11-03 (×12): 5 mg via ORAL
  Filled 2023-10-28 (×12): qty 1

## 2023-10-28 MED ORDER — TRAZODONE HCL 50 MG PO TABS
25.0000 mg | ORAL_TABLET | Freq: Every evening | ORAL | Status: DC | PRN
  Administered 2023-10-28 – 2023-11-02 (×5): 25 mg via ORAL
  Filled 2023-10-28 (×5): qty 1

## 2023-10-28 MED ORDER — ACETAMINOPHEN 325 MG PO TABS: 650.0000 mg | ORAL_TABLET | Freq: Four times a day (QID) | ORAL | Status: DC | PRN

## 2023-10-28 MED ORDER — ONDANSETRON HCL 4 MG PO TABS: 4.0000 mg | ORAL_TABLET | Freq: Four times a day (QID) | ORAL | Status: DC | PRN

## 2023-10-28 MED ORDER — ONDANSETRON HCL 4 MG/2ML IJ SOLN: 4.0000 mg | Freq: Four times a day (QID) | INTRAMUSCULAR | Status: DC | PRN

## 2023-10-28 MED ORDER — APIXABAN 5 MG PO TABS: 5.0000 mg | ORAL_TABLET | Freq: Two times a day (BID) | ORAL | Status: DC

## 2023-10-28 MED ORDER — FUROSEMIDE 10 MG/ML IJ SOLN
40.0000 mg | Freq: Once | INTRAMUSCULAR | Status: AC
  Administered 2023-10-28: 40 mg via INTRAVENOUS
  Filled 2023-10-28: qty 4

## 2023-10-28 MED ORDER — ALBUTEROL SULFATE HFA 108 (90 BASE) MCG/ACT IN AERS: 2.0000 | INHALATION_SPRAY | RESPIRATORY_TRACT | Status: DC | PRN

## 2023-10-28 NOTE — H&P (Signed)
History and Physical  Eric Lambert WUJ:[id number] DOB: 07-Mar-1929 DOA: 10/28/2023  PCP: [name], MD   Chief Complaint: Cough, shortness of breath  HPI: Eric Lambert is a 89 y.o. male with medical history significant for heart failure with preserved EF, mitral regurgitation, essential hypertension, permanent atrial fibrillation, CKD stage IIIb being admitted to the hospital with recurrent right greater than left pleural effusion.  He was recently hospitalized at [facility] 11/11 to 11/14 for shortness of breath and right greater than left pleural effusion which was felt to be due to heart failure decompensation.  That hospital stay was complicated by some acute on chronic renal failure.  He was diuresed gently, due to his renal failure and some mild hypotension, improved symptomatically and was discharged home from the hospital with oral Lasix 40 mg p.o. daily, which she is still taking.  He tells me that he did well for some time, but over the last week or so he has noticed some more dyspnea with exertion, and nonproductive cough.  He denies any chest pain, fevers, vomiting, dysuria, lower extremity edema or other concerns.  He contacted his PCP, reluctantly came to the ER for evaluation where workup as noted below shows evidence of worsening right greater than left pleural effusion.  He was given a dose of IV Lasix in the emergency department, and hospitalist was contacted for admission.  Review of Systems: Please see HPI for pertinent positives and negatives. A complete 10 system review of systems are otherwise negative.  Past Medical History:  Diagnosis Date   Allergy    Arthritis    Atrial fibrillation -permanent    BPH (benign prostatic hyperplasia)    Cancer (HCC)    HX OF SKIN CANCER    CHF (congestive heart failure) (HCC)    resolved after pacemaker - tachycardia induced   Complete heart block (HCC)    Dyspnea    mild   ED (erectile dysfunction)     GERD (gastroesophageal reflux disease)    GLUCOSE INTOLERANCE 10/22/2007   no recent issues   Heart murmur    OSA (obstructive sleep apnea)    NO CPAP    Pacemaker BSX    dual   Pneumonia    HX OF SEVERAL TIMES AS A CHILD    PONV (postoperative nausea and vomiting)    at age 36    Presence of permanent cardiac pacemaker    SUBACUTE BACTERIAL ENDOCARDITIS 1970s   Past Surgical History:  Procedure Laterality Date   ESOPHAGOGASTRODUODENOSCOPY (EGD) WITH PROPOFOL N/A 01/07/2022   Procedure: ESOPHAGOGASTRODUODENOSCOPY (EGD) WITH PROPOFOL;  Surgeon: Beverley Fiedler, MD;  Location: [facility] ENDOSCOPY;  Service: Gastroenterology;  Laterality: N/A;   IMPACTION REMOVAL  01/07/2022   Procedure: IMPACTION REMOVAL;  Surgeon: Beverley Fiedler, MD;  Location: [facility] ENDOSCOPY;  Service: Gastroenterology;;   INGUINAL HERNIA REPAIR Bilateral 04/08/2021   Procedure: LAPAROSCOPIC BILATERAL INGUINAL HERNIA REPAIR WITH MESH;  Surgeon: Kinsinger, De Blanch, MD;  Location: [facility] ORS;  Service: General;  Laterality: Bilateral;   INSERT / REPLACE / REMOVE PACEMAKER     PACEMAKER GENERATOR CHANGE N/A 05/07/2012   Procedure: PACEMAKER GENERATOR CHANGE;  Surgeon: Duke Salvia, MD;  Location: [facility] CATH LAB;  Service: Cardiovascular;  Laterality: N/A;   PACEMAKER PLACEMENT     PARTIAL HIP ARTHROPLASTY     2008   PPM GENERATOR CHANGEOUT N/A 09/16/2023   Procedure: PPM GENERATOR CHANGEOUT;  Surgeon: Duke Salvia, MD;  Location: [facility] INVASIVE CV  LAB;  Service: Cardiovascular;  Laterality: N/A;   TOTAL HIP ARTHROPLASTY Left 01/07/2022   Procedure: TOTAL HIP ARTHROPLASTY ANTERIOR APPROACH;  Surgeon: Sheral Apley, MD;  Location: [facility] ORS;  Service: Orthopedics;  Laterality: Left;    Social History:  reports that he has never smoked. He has never used smokeless tobacco. He reports that he does not currently use alcohol. He reports that he does not use drugs.   No Known Allergies  Family History  Problem Relation Age of Onset    Prostate cancer Father    Diabetes Brother    Prostate cancer Brother    Brain cancer Brother    Heart disease Other        mothers side men- strokes and heart attacks   Colon cancer Neg Hx    Pancreatic cancer Neg Hx    Stomach cancer Neg Hx      Prior to Admission medications   Medication Sig Start Date End Date Taking? Authorizing Provider  apixaban (ELIQUIS) 5 MG TABS tablet Take 1 tablet by mouth twice daily 10/26/23   Duke Salvia, MD  Cholecalciferol (VITAMIN D-3 PO) Take 1 tablet by mouth daily.    [provider]  Cyanocobalamin (VITAMIN B-12 PO) Take 2 tablets by mouth daily.    [provider]  escitalopram (LEXAPRO) 5 MG tablet Take 2 tablets (10 mg total) by mouth daily. Please take 5 mg of your Lexapro daily for the next 2 weeks, afterwards you may increase to 2 pills a day or 10 mg a day. 10/19/23   Brenton Grills, MD  famotidine (PEPCID) 40 MG tablet Take 1 tablet (40 mg total) by mouth at bedtime. 12/09/22   Unk Lightning, PA  furosemide (LASIX) 40 MG tablet Take 1 tablet (40 mg total) by mouth daily. 10/13/23 11/12/23  [name], MD  Multiple Vitamins-Minerals (OCUVITE EYE HEALTH FORMULA PO) Take 1 tablet by mouth in the morning and at bedtime.    [provider]  omeprazole (PRILOSEC) 40 MG capsule TAKE 1 CAPSULE BY MOUTH TWICE DAILY 30-60 MINUTES BEFORE MEALS (BREAKFAST AND DINNER) 10/12/23   Unk Lightning, PA  traZODone (DESYREL) 50 MG tablet Take 0.5-1 tablets (25-50 mg total) by mouth at bedtime as needed for sleep. 10/20/23   [name], MD  verapamil (CALAN-SR) 240 MG CR tablet Take 1 tablet by mouth once daily 05/06/23   Duke Salvia, MD    Physical Exam: BP (!) 154/96   Pulse 70   Temp 98.2 F (36.8 C) (Oral)   Resp (!) 23   SpO2 96%   General:  Alert, oriented, calm, in no acute distress, thin elderly gentleman resting comfortably, sitting upright on the edge of the bed.  His wife is at the  bedside.  He looks younger than his stated age. Eyes: EOMI, clear conjuctivae, white sclerea Neck: supple, no masses, trachea mildline  Cardiovascular: Irregularly irregular, no murmurs or rubs, no significant peripheral edema  Respiratory: Breath sounds are diminished at the bilateral bases, no wheezing, tachypnea, or other evidence of respiratory distress, currently wearing 2 L nasal cannula oxygen Abdomen: soft, nontender, nondistended, normal bowel tones heard  Skin: dry, no rashes  Musculoskeletal: no joint effusions, normal range of motion  Psychiatric: appropriate affect, normal speech  Neurologic: extraocular muscles intact, clear speech, moving all extremities with intact sensorium         Labs on Admission:  Basic Metabolic Panel: Recent Labs  Lab 10/28/23 1415  NA 141  K 3.7  CL 103  CO2 25  GLUCOSE 92  BUN 36*  CREATININE 1.05  CALCIUM 8.9   Liver Function Tests: Recent Labs  Lab 10/28/23 1415  AST 28  ALT 28  ALKPHOS 79  BILITOT 1.4*  PROT 6.9  ALBUMIN 3.8   No results for input(s): "LIPASE", "AMYLASE" in the last 168 hours. No results for input(s): "AMMONIA" in the last 168 hours. CBC: Recent Labs  Lab 10/28/23 1415  WBC 7.4  NEUTROABS 5.2  HGB 14.8  HCT 45.5  MCV 91.9  PLT 128*   Cardiac Enzymes: No results for input(s): "CKTOTAL", "CKMB", "CKMBINDEX", "TROPONINI" in the last 168 hours.  BNP (last 3 results) Recent Labs    10/05/23 1633 10/28/23 1415  BNP 229.2* 243.6*    ProBNP (last 3 results) No results for input(s): "PROBNP" in the last 8760 hours.  CBG: No results for input(s): "GLUCAP" in the last 168 hours.  Radiological Exams on Admission: DG Chest 2 View  Result Date: 10/28/2023 CLINICAL DATA:  Shortness of breath, increasing with exertion. Recent change in diuretic dosage. EXAM: CHEST - 2 VIEW COMPARISON:  Radiographs 10/06/2023 and 10/05/2023.  CT 10/05/2023. FINDINGS: Left subclavian pacemaker leads appear unchanged,  projecting over the right atrium and right ventricle. The heart size and mediastinal contours are stable. There are enlarging right greater than left pleural effusions with increasing probable compressive atelectasis at both lung bases. There is vascular congestion without overt pulmonary edema. No confluent airspace disease or pneumothorax. The bones appear unchanged. IMPRESSION: Enlarging right greater than left pleural effusions with increasing probable compressive atelectasis at both lung bases. Vascular congestion without overt pulmonary edema. Electronically Signed   By: Carey Bullocks M.D.   On: 10/28/2023 14:42    Assessment/Plan Eric Lambert is a 89 y.o. male with medical history significant for heart failure with preserved EF, mitral regurgitation, essential hypertension, permanent atrial fibrillation, CKD stage IIIb being admitted to the hospital with recurrent right greater than left pleural effusion.   Recurrent right greater than left pleural effusion-presumably due to heart failure, though overall patient looks euvolemic, BNP is stable, and he has been continuing to take Lasix 40 mg since discharge. -Observation admission to telemetry -Received IV Lasix in the emergency department -Will plan for diagnostic and therapeutic right sided thoracentesis with IR in the morning  Heart failure with preserved EF-recent echo with preserved EF, mildly dilated cavity, left and right atrial severe dilation, and moderate mitral valve regurgitation.  GERD-Pepcid  Atrial fibrillation-continue Eliquis and verapamil  CKD stage IIIb-renal function is stable  DVT prophylaxis: Eliquis     Code Status: Full Code  Consults called: None  Admission status: Observation  Time spent: 59 minutes  Eric Pieratt Sharlette Dense MD [facility] Pager [phone]  If 7PM-7AM, please contact night-coverage [email or web address] Password [facility]  10/28/2023, 3:47 PM

## 2023-10-28 NOTE — Telephone Encounter (Signed)
Final Outcome :Advised to go to Ed now , pt disagree  Patient Name First: Eric HOWELL Last: Lambert Gender: Male DOB: 01-01-1929 Age: 89 Y 4 M 6 D Return Phone Number: [phone] (Primary) Address: City/ State/ Zip: [place]  [address] Client [facility] - [facility], [facility] Provider Tana Conch- MD Contact Type Call Who Is Calling Patient / Member / Family / Caregiver Call Type Triage / Clinical Relationship To Patient Self Return Phone Number [phone] (Primary) Chief Complaint BREATHING - shortness of breath or sounds breathless Reason for Call Symptomatic / Request for Health Information Initial Comment Pt has SOB and congestion. Office is transferring for triage. Translation No Nurse Assessment Nurse: Izora Ribas, RN, Melanie Date/Time (Eastern Time): 10/28/2023 11:54:57 AM Confirm and document reason for call. If symptomatic, describe symptoms. ---Caller states has some congestion, not in great discomfort at rest. Slight activity makes his heart pump more, breathing seems to be getting worse. Breathing device AirLock, like IS, causes coughing. Started 2-3 days ago. HX: Lasix, hospitalized a couple weeks ago. Does the patient have any new or worsening symptoms? ---Yes Will a triage be completed? ---Yes Related visit to physician within the last 2 weeks? ---Yes Does the PT have any chronic conditions? (i.e. diabetes, asthma, this includes High risk factors for pregnancy, etc.) ---Yes List chronic conditions. ---HTN, Afib, pacemaker, leaky valve. Is this a behavioral health or substance abuse call? ---No Guidelines Guideline Title Affirmed Question Affirmed Notes Nurse Date/Time (Eastern Time) Breathing Difficulty [1] MODERATE difficulty breathing (e.g., speaks in phrases, SOB even at rest, pulse 100-120) AND [2] NEW-onset Kennieth Francois 10/28/2023  11:57:54 AM Guidelines Guideline Title Affirmed Question Affirmed Notes Nurse Date/Time (Eastern Time) or WORSE than normal Disp. Time Lamount Cohen Time) Disposition Final User 10/28/2023 11:53:56 AM Send to Urgent Ellin Goodie 10/28/2023 12:03:47 PM Go to ED Now Yes Izora Ribas, RN, Melanie Final Disposition 10/28/2023 12:03:47 PM Go to ED Now Yes Izora Ribas, RN, Mittie Bodo Disagree/Comply Disagree Caller Understands Yes PreDisposition Call Doctor Care Advice Given Per Guideline GO TO ED NOW: * You need to be seen in the Emergency Department. * Go to the ED at ___________ Hospital. * Leave now. Drive carefully. * Another adult should drive. BRING MEDICINES: CALL 911 IF: * You become worse CARE ADVICE given per Breathing Difficulty (Adult) guideline. Comments User: Patria Mane, RN Date/Time Lamount Cohen Time): 10/28/2023 11:59:59 AM Splitting lasix in thirds and spreading it out over the day User: Patria Mane, RN Date/Time Lamount Cohen Time): 10/28/2023 12:06:21 PM Will think about going. Referrals GO TO FACILITY UNDECIDED

## 2023-10-28 NOTE — Plan of Care (Signed)
  Problem: Education: Goal: Knowledge of General Education information will improve Description: Including pain rating scale, medication(s)/side effects and non-pharmacologic comfort measures Outcome: Progressing   Problem: Clinical Measurements: Goal: Diagnostic test results will improve Outcome: Progressing Goal: Respiratory complications will improve Outcome: Progressing Goal: Cardiovascular complication will be avoided Outcome: Progressing   Problem: Activity: Goal: Risk for activity intolerance will decrease Outcome: Progressing   Problem: Nutrition: Goal: Adequate nutrition will be maintained Outcome: Progressing   Problem: Safety: Goal: Ability to remain free from injury will improve Outcome: Progressing

## 2023-10-28 NOTE — ED Notes (Signed)
Provided pt ham sandwich and water

## 2023-10-28 NOTE — ED Triage Notes (Signed)
Patient presents due to exertional SOB. Lasix dose was recently changed. Patient becomes easily fatigue and also complains of a dry cough. EMS noted diminished lower lungs. Denies chest pain, nausea, vomiting and diarrhea.     EMS vitals: 89-90 % SPO2 on room air, 96 % SPO2 on 2 L O2 nasal canula  130/80 BP 70 HR

## 2023-10-28 NOTE — Telephone Encounter (Signed)
Will follow-up on ED disposition-glad he is receiving care

## 2023-10-28 NOTE — ED Provider Notes (Signed)
[place] EMERGENCY DEPARTMENT AT [facility] Provider Note   CSN: [id number] Arrival date & time: 10/28/23  1308     History  Chief Complaint  Patient presents with   Shortness of Breath    Shelley Barbian is a 89 y.o. male.  HPI Patient presents with dyspnea, fatigue.  Patient has recently diagnosed congestive heart failure.  He notes that since hospitalization he has been taking his Lasix regularly, doing a milligrams, 3 times daily.  He has good urination, but has recently developed worsening shortness of breath.  He is initially alone but is then joined by his wife who corroborates the history.  Additional details per chart review consistent with this.    Home Medications Prior to Admission medications   Medication Sig Start Date End Date Taking? Authorizing Provider  apixaban (ELIQUIS) 5 MG TABS tablet Take 1 tablet by mouth twice daily 10/26/23   Duke Salvia, MD  Cholecalciferol (VITAMIN D-3 PO) Take 1 tablet by mouth daily.    [provider]  Cyanocobalamin (VITAMIN B-12 PO) Take 2 tablets by mouth daily.    [provider]  escitalopram (LEXAPRO) 5 MG tablet Take 2 tablets (10 mg total) by mouth daily. Please take 5 mg of your Lexapro daily for the next 2 weeks, afterwards you may increase to 2 pills a day or 10 mg a day. 10/19/23   Brenton Grills, MD  famotidine (PEPCID) 40 MG tablet Take 1 tablet (40 mg total) by mouth at bedtime. 12/09/22   Unk Lightning, PA  furosemide (LASIX) 40 MG tablet Take 1 tablet (40 mg total) by mouth daily. 10/13/23 11/12/23  [name], MD  Multiple Vitamins-Minerals (OCUVITE EYE HEALTH FORMULA PO) Take 1 tablet by mouth in the morning and at bedtime.    [provider]  omeprazole (PRILOSEC) 40 MG capsule TAKE 1 CAPSULE BY MOUTH TWICE DAILY 30-60 MINUTES BEFORE MEALS (BREAKFAST AND DINNER) 10/12/23   Unk Lightning, PA  traZODone (DESYREL) 50 MG tablet Take 0.5-1 tablets  (25-50 mg total) by mouth at bedtime as needed for sleep. 10/20/23   [name], MD  verapamil (CALAN-SR) 240 MG CR tablet Take 1 tablet by mouth once daily 05/06/23   Duke Salvia, MD      Allergies    Patient has no known allergies.    Review of Systems   Review of Systems  Physical Exam Updated Vital Signs BP (!) 154/96   Pulse 70   Temp 98.2 F (36.8 C) (Oral)   Resp (!) 23   SpO2 96%  Physical Exam Vitals and nursing note reviewed.  Constitutional:      General: He is not in acute distress. HENT:     Head: Normocephalic and atraumatic.  Eyes:     Conjunctiva/sclera: Conjunctivae normal.  Cardiovascular:     Rate and Rhythm: Normal rate. Rhythm irregular.  Pulmonary:     Effort: Pulmonary effort is normal. Tachypnea present.     Breath sounds: Decreased breath sounds present.  Abdominal:     General: There is no distension.  Musculoskeletal:     Right lower leg: No edema.     Left lower leg: No edema.  Skin:    General: Skin is warm and dry.  Neurological:     Mental Status: He is alert and oriented to person, place, and time.     ED Results / Procedures / Treatments   Labs (all labs ordered are listed, but  only abnormal results are displayed) Labs Reviewed  COMPREHENSIVE METABOLIC PANEL - Abnormal; Notable for the following components:      Result Value   BUN 36 (*)    Total Bilirubin 1.4 (*)    All other components within normal limits  CBC WITH DIFFERENTIAL/PLATELET - Abnormal; Notable for the following components:   RDW 15.9 (*)    Platelets 128 (*)    All other components within normal limits  BRAIN NATRIURETIC PEPTIDE - Abnormal; Notable for the following components:   B Natriuretic Peptide 243.6 (*)    All other components within normal limits    EKG EKG Interpretation Date/Time:  Wednesday October 28 2023 14:35:48 EST Ventricular Rate:  93 PR Interval:    QRS Duration:  110 QT Interval:  473 QTC Calculation: 553 R  Axis:   107  Text Interpretation: Atrial fibrillation Paired ventricular premature complexes Probable lateral infarct, age indeterminate Anteroseptal infarct, old Prolonged QT interval Confirmed by Gerhard Munch [phone]) on 10/28/2023 3:17:22 PM  Radiology DG Chest 2 View  Result Date: 10/28/2023 CLINICAL DATA:  Shortness of breath, increasing with exertion. Recent change in diuretic dosage. EXAM: CHEST - 2 VIEW COMPARISON:  Radiographs 10/06/2023 and 10/05/2023.  CT 10/05/2023. FINDINGS: Left subclavian pacemaker leads appear unchanged, projecting over the right atrium and right ventricle. The heart size and mediastinal contours are stable. There are enlarging right greater than left pleural effusions with increasing probable compressive atelectasis at both lung bases. There is vascular congestion without overt pulmonary edema. No confluent airspace disease or pneumothorax. The bones appear unchanged. IMPRESSION: Enlarging right greater than left pleural effusions with increasing probable compressive atelectasis at both lung bases. Vascular congestion without overt pulmonary edema. Electronically Signed   By: Carey Bullocks M.D.   On: 10/28/2023 14:42    Procedures Procedures    Medications Ordered in ED Medications  albuterol (VENTOLIN HFA) 108 (90 Base) MCG/ACT inhaler 2 puff (has no administration in time range)  furosemide (LASIX) injection 40 mg (has no administration in time range)    ED Course/ Medical Decision Making/ A&P                                 Medical Decision Making Elderly male with A-fib anticoagulated with new diagnosis of heart failure presents with weakness.  Broad differential including symptomatic A-fib, anemia, heart failure exacerbation infection all considered. Cardiac 70 A-fib abnormal Pulse ox variable, 89%, 94% on room air, 98% with 2 L nasal cannula  Amount and/or Complexity of Data Reviewed Independent Historian: spouse External Data Reviewed:  notes.    Details: Echo reviewed details below Labs: ordered. Decision-making details documented in ED Course. Radiology: ordered and independent interpretation performed. Decision-making details documented in ED Course. ECG/medicine tests: ordered and independent interpretation performed. Decision-making details documented in ED Course.  Risk Prescription drug management. Decision regarding hospitalization. Diagnosis or treatment significantly limited by social determinants of health.  ECHO  1. Left ventricular ejection fraction, by estimation, is 60 to 65%. The  left ventricle has normal function. The left ventricle has no regional  wall motion abnormalities. The left ventricular internal cavity size was  mildly dilated. Left ventricular  diastolic function could not be evaluated.   2. Right ventricular systolic function is normal. The right ventricular  size is moderately enlarged. There is mildly elevated pulmonary artery  systolic pressure. The estimated right ventricular systolic pressure is  36.2 mmHg.  3. Left atrial size was massively dilated.   4. Right atrial size was severely dilated.   5. The mitral valve is degenerative. Moderate mitral valve regurgitation.  No evidence of mitral stenosis. There is mild late systolic prolapse of  the middle scallop of the posterior leaflet of the mitral valve.   6. The aortic valve is tricuspid. There is moderate calcification of the  aortic valve. There is moderate thickening of the aortic valve. Aortic  valve regurgitation is not visualized. Aortic valve  sclerosis/calcification is present, without any evidence  of aortic stenosis.   7. The inferior vena cava is normal in size with greater than 50%  respiratory variability, suggesting right atrial pressure of 3 mmHg.    3:26 PM Patient now with continued requirement of 2 L, BNP essentially equal as before in spite of increased Lasix dosing, and x-ray with bilateral pleural  effusions worsening all consistent with worsening heart failure, and given the patient's known disease, patient will require IV Lasix, admission for further monitoring, management.  No other evidence suggesting concurrent pathology like pneumonia, ACS.        Final Clinical Impression(s) / ED Diagnoses Final diagnoses:  Acute on chronic diastolic congestive heart failure (HCC)     Gerhard Munch, MD 10/28/23 1527

## 2023-10-28 NOTE — Telephone Encounter (Signed)
Pt currently at ED  

## 2023-10-28 NOTE — ED Notes (Signed)
ED TO INPATIENT HANDOFF REPORT  Name/Age/Gender Eric Lambert 89 y.o. male  Code Status    Code Status Orders  (From admission, onward)           Start     Ordered   10/28/23 1545  Full code  Continuous       Question:  By:  Answer:  Consent: discussion documented in EHR   10/28/23 1546           Code Status History     Date Active Date Inactive Code Status Order ID Comments User Context   10/05/2023 1547 10/08/2023 2000 Full Code [id number]  Jacques Navy, MD ED   09/16/2023 0916 09/16/2023 1531 Full Code [id number]  Duke Salvia, MD Inpatient   01/07/2022 1050 01/08/2022 2116 Full Code [id number]  Jenne Pane, PA-C Inpatient       Home/SNF/Other Home  Chief Complaint Recurrent right pleural effusion [J90]  Level of Care/Admitting Diagnosis ED Disposition     ED Disposition  Admit   Condition  --   Comment  Hospital Area: [facility] [[id number]]  Level of Care: Telemetry [5]  Admit to tele based on following criteria: Acute CHF  May place patient in observation at [facility] or [facility] if equivalent level of care is available:: Yes  Covid Evaluation: Asymptomatic - no recent exposure (last 10 days) testing not required  Diagnosis: Recurrent right pleural effusion [723786]  Admitting Physician: Maryln Gottron [[id number]]  Attending Physician: Kirby Crigler, MIR [email or web address] [[id number]]          Medical History Past Medical History:  Diagnosis Date   Allergy    Arthritis    Atrial fibrillation -permanent    BPH (benign prostatic hyperplasia)    Cancer (HCC)    HX OF SKIN CANCER    CHF (congestive heart failure) (HCC)    resolved after pacemaker - tachycardia induced   Complete heart block (HCC)    Dyspnea    mild   ED (erectile dysfunction)    GERD (gastroesophageal reflux disease)    GLUCOSE INTOLERANCE 10/22/2007   no recent issues   Heart murmur    OSA (obstructive sleep apnea)    NO CPAP    Pacemaker BSX     dual   Pneumonia    HX OF SEVERAL TIMES AS A CHILD    PONV (postoperative nausea and vomiting)    at age 89    Presence of permanent cardiac pacemaker    SUBACUTE BACTERIAL ENDOCARDITIS 1970s    Allergies No Known Allergies  IV Location/Drains/Wounds Patient Lines/Drains/Airways Status     Active Line/Drains/Airways     Name Placement date Placement time Site Days   Peripheral IV 10/28/23 22 G Anterior;Right Forearm 10/28/23  1555  Forearm  less than 1            Labs/Imaging Results for orders placed or performed during the hospital encounter of 10/28/23 (from the past 48 hour(s))  Comprehensive metabolic panel     Status: Abnormal   Collection Time: 10/28/23  2:15 PM  Result Value Ref Range   Sodium 141 135 - 145 mmol/L   Potassium 3.7 3.5 - 5.1 mmol/L   Chloride 103 98 - 111 mmol/L   CO2 25 22 - 32 mmol/L   Glucose, Bld 92 70 - 99 mg/dL    Comment: Glucose reference range applies only to samples taken after fasting for at least 8 hours.   BUN 36 (H) 8 -  23 mg/dL   Creatinine, Ser 0.98 0.61 - 1.24 mg/dL   Calcium 8.9 8.9 - 11.9 mg/dL   Total Protein 6.9 6.5 - 8.1 g/dL   Albumin 3.8 3.5 - 5.0 g/dL   AST 28 15 - 41 U/L   ALT 28 0 - 44 U/L   Alkaline Phosphatase 79 38 - 126 U/L   Total Bilirubin 1.4 (H) <1.2 mg/dL   GFR, Estimated >14 >78 mL/min    Comment: (NOTE) Calculated using the CKD-EPI Creatinine Equation (2021)    Anion gap 13 5 - 15    Comment: Performed at [facility], [address] [place], [place] [address]  CBC with Differential     Status: Abnormal   Collection Time: 10/28/23  2:15 PM  Result Value Ref Range   WBC 7.4 4.0 - 10.5 K/uL   RBC 4.95 4.22 - 5.81 MIL/uL   Hemoglobin 14.8 13.0 - 17.0 g/dL   HCT 13.0 86.5 - 78.4 %   MCV 91.9 80.0 - 100.0 fL   MCH 29.9 26.0 - 34.0 pg   MCHC 32.5 30.0 - 36.0 g/dL   RDW 69.6 (H) 29.5 - 28.4 %   Platelets 128 (L) 150 - 400 K/uL   nRBC 0.0 0.0 - 0.2 %   Neutrophils Relative % 70 %    Neutro Abs 5.2 1.7 - 7.7 K/uL   Lymphocytes Relative 19 %   Lymphs Abs 1.4 0.7 - 4.0 K/uL   Monocytes Relative 9 %   Monocytes Absolute 0.6 0.1 - 1.0 K/uL   Eosinophils Relative 2 %   Eosinophils Absolute 0.1 0.0 - 0.5 K/uL   Basophils Relative 0 %   Basophils Absolute 0.0 0.0 - 0.1 K/uL   Immature Granulocytes 0 %   Abs Immature Granulocytes 0.02 0.00 - 0.07 K/uL    Comment: Performed at [facility], [address] [place], [place] [address]  Brain natriuretic peptide     Status: Abnormal   Collection Time: 10/28/23  2:15 PM  Result Value Ref Range   B Natriuretic Peptide 243.6 (H) 0.0 - 100.0 pg/mL    Comment: Performed at [facility], [address], [place], [place] [address]   DG Chest 2 View  Result Date: 10/28/2023 CLINICAL DATA:  Shortness of breath, increasing with exertion. Recent change in diuretic dosage. EXAM: CHEST - 2 VIEW COMPARISON:  Radiographs 10/06/2023 and 10/05/2023.  CT 10/05/2023. FINDINGS: Left subclavian pacemaker leads appear unchanged, projecting over the right atrium and right ventricle. The heart size and mediastinal contours are stable. There are enlarging right greater than left pleural effusions with increasing probable compressive atelectasis at both lung bases. There is vascular congestion without overt pulmonary edema. No confluent airspace disease or pneumothorax. The bones appear unchanged. IMPRESSION: Enlarging right greater than left pleural effusions with increasing probable compressive atelectasis at both lung bases. Vascular congestion without overt pulmonary edema. Electronically Signed   By: Carey Bullocks M.D.   On: 10/28/2023 14:42    Pending Labs Unresulted Labs (From admission, onward)     Start     Ordered   10/29/23 0500  Basic metabolic panel  Tomorrow morning,   R        10/28/23 1546   10/29/23 0500  CBC  Tomorrow morning,   R        10/28/23 1546            Vitals/Pain Today's  Vitals   10/28/23 1313 10/28/23 1314 10/28/23  1322 10/28/23 1430  BP: 122/88   (!) 154/96  Pulse: 80 77  70  Resp: 18   (!) 23  Temp: 98.2 F (36.8 C)     TempSrc: Oral     SpO2: 93% 95%  96%  PainSc:   0-No pain     Isolation Precautions No active isolations  Medications Medications  albuterol (VENTOLIN HFA) 108 (90 Base) MCG/ACT inhaler 2 puff (has no administration in time range)  famotidine (PEPCID) tablet 40 mg (has no administration in time range)  acetaminophen (TYLENOL) tablet 650 mg (has no administration in time range)    Or  acetaminophen (TYLENOL) suppository 650 mg (has no administration in time range)  traZODone (DESYREL) tablet 25 mg (has no administration in time range)  ondansetron (ZOFRAN) tablet 4 mg (has no administration in time range)    Or  ondansetron (ZOFRAN) injection 4 mg (has no administration in time range)  apixaban (ELIQUIS) tablet 5 mg (has no administration in time range)  furosemide (LASIX) injection 40 mg (40 mg Intravenous Given 10/28/23 1555)    Mobility walks with device

## 2023-10-28 NOTE — Telephone Encounter (Signed)
FYI: This call has been transferred to triage nurse: the Triage Nurse. Once the result note has been entered staff can address the message at that time.  Patient called in with the following symptoms:  Red Word:Shortness of Breath    Please advise at Mobile [phone] (mobile)  Message is routed to Provider Pool.

## 2023-10-29 ENCOUNTER — Observation Stay (HOSPITAL_COMMUNITY): Payer: Medicare HMO

## 2023-10-29 DIAGNOSIS — Z950 Presence of cardiac pacemaker: Secondary | ICD-10-CM | POA: Diagnosis not present

## 2023-10-29 DIAGNOSIS — Z7901 Long term (current) use of anticoagulants: Secondary | ICD-10-CM | POA: Diagnosis not present

## 2023-10-29 DIAGNOSIS — I5031 Acute diastolic (congestive) heart failure: Secondary | ICD-10-CM | POA: Diagnosis not present

## 2023-10-29 DIAGNOSIS — Z96642 Presence of left artificial hip joint: Secondary | ICD-10-CM | POA: Diagnosis not present

## 2023-10-29 DIAGNOSIS — I2510 Atherosclerotic heart disease of native coronary artery without angina pectoris: Secondary | ICD-10-CM | POA: Diagnosis not present

## 2023-10-29 DIAGNOSIS — Z8249 Family history of ischemic heart disease and other diseases of the circulatory system: Secondary | ICD-10-CM | POA: Diagnosis not present

## 2023-10-29 DIAGNOSIS — N1832 Chronic kidney disease, stage 3b: Secondary | ICD-10-CM | POA: Diagnosis not present

## 2023-10-29 DIAGNOSIS — N400 Enlarged prostate without lower urinary tract symptoms: Secondary | ICD-10-CM | POA: Diagnosis not present

## 2023-10-29 DIAGNOSIS — N179 Acute kidney failure, unspecified: Secondary | ICD-10-CM | POA: Diagnosis not present

## 2023-10-29 DIAGNOSIS — F32A Depression, unspecified: Secondary | ICD-10-CM | POA: Diagnosis not present

## 2023-10-29 DIAGNOSIS — Z79899 Other long term (current) drug therapy: Secondary | ICD-10-CM | POA: Diagnosis not present

## 2023-10-29 DIAGNOSIS — J811 Chronic pulmonary edema: Secondary | ICD-10-CM | POA: Diagnosis not present

## 2023-10-29 DIAGNOSIS — R918 Other nonspecific abnormal finding of lung field: Secondary | ICD-10-CM | POA: Diagnosis not present

## 2023-10-29 DIAGNOSIS — J984 Other disorders of lung: Secondary | ICD-10-CM | POA: Diagnosis not present

## 2023-10-29 DIAGNOSIS — K219 Gastro-esophageal reflux disease without esophagitis: Secondary | ICD-10-CM | POA: Diagnosis not present

## 2023-10-29 DIAGNOSIS — I5082 Biventricular heart failure: Secondary | ICD-10-CM | POA: Diagnosis not present

## 2023-10-29 DIAGNOSIS — I341 Nonrheumatic mitral (valve) prolapse: Secondary | ICD-10-CM | POA: Diagnosis not present

## 2023-10-29 DIAGNOSIS — I495 Sick sinus syndrome: Secondary | ICD-10-CM | POA: Diagnosis not present

## 2023-10-29 DIAGNOSIS — I442 Atrioventricular block, complete: Secondary | ICD-10-CM | POA: Diagnosis not present

## 2023-10-29 DIAGNOSIS — I428 Other cardiomyopathies: Secondary | ICD-10-CM | POA: Diagnosis not present

## 2023-10-29 DIAGNOSIS — K760 Fatty (change of) liver, not elsewhere classified: Secondary | ICD-10-CM | POA: Diagnosis not present

## 2023-10-29 DIAGNOSIS — I130 Hypertensive heart and chronic kidney disease with heart failure and stage 1 through stage 4 chronic kidney disease, or unspecified chronic kidney disease: Secondary | ICD-10-CM | POA: Diagnosis not present

## 2023-10-29 DIAGNOSIS — J9811 Atelectasis: Secondary | ICD-10-CM | POA: Diagnosis not present

## 2023-10-29 DIAGNOSIS — E876 Hypokalemia: Secondary | ICD-10-CM | POA: Diagnosis not present

## 2023-10-29 DIAGNOSIS — Z48813 Encounter for surgical aftercare following surgery on the respiratory system: Secondary | ICD-10-CM | POA: Diagnosis not present

## 2023-10-29 DIAGNOSIS — Z85828 Personal history of other malignant neoplasm of skin: Secondary | ICD-10-CM | POA: Diagnosis not present

## 2023-10-29 DIAGNOSIS — J90 Pleural effusion, not elsewhere classified: Secondary | ICD-10-CM | POA: Diagnosis not present

## 2023-10-29 DIAGNOSIS — I517 Cardiomegaly: Secondary | ICD-10-CM | POA: Diagnosis not present

## 2023-10-29 DIAGNOSIS — J918 Pleural effusion in other conditions classified elsewhere: Secondary | ICD-10-CM | POA: Diagnosis not present

## 2023-10-29 DIAGNOSIS — I340 Nonrheumatic mitral (valve) insufficiency: Secondary | ICD-10-CM | POA: Diagnosis not present

## 2023-10-29 DIAGNOSIS — I4821 Permanent atrial fibrillation: Secondary | ICD-10-CM | POA: Diagnosis not present

## 2023-10-29 DIAGNOSIS — I700 Atherosclerosis of aorta: Secondary | ICD-10-CM | POA: Diagnosis not present

## 2023-10-29 DIAGNOSIS — R0989 Other specified symptoms and signs involving the circulatory and respiratory systems: Secondary | ICD-10-CM | POA: Diagnosis not present

## 2023-10-29 DIAGNOSIS — I5033 Acute on chronic diastolic (congestive) heart failure: Secondary | ICD-10-CM | POA: Diagnosis not present

## 2023-10-29 LAB — CBC
HCT: 40.6 % (ref 39.0–52.0)
Hemoglobin: 13.3 g/dL (ref 13.0–17.0)
MCH: 30.2 pg (ref 26.0–34.0)
MCHC: 32.8 g/dL (ref 30.0–36.0)
MCV: 92.3 fL (ref 80.0–100.0)
Platelets: 125 10*3/uL — ABNORMAL LOW (ref 150–400)
RBC: 4.4 MIL/uL (ref 4.22–5.81)
RDW: 15.8 % — ABNORMAL HIGH (ref 11.5–15.5)
WBC: 6.9 10*3/uL (ref 4.0–10.5)
nRBC: 0 % (ref 0.0–0.2)

## 2023-10-29 LAB — BASIC METABOLIC PANEL
Anion gap: 8 (ref 5–15)
BUN: 32 mg/dL — ABNORMAL HIGH (ref 8–23)
CO2: 27 mmol/L (ref 22–32)
Calcium: 8.3 mg/dL — ABNORMAL LOW (ref 8.9–10.3)
Chloride: 106 mmol/L (ref 98–111)
Creatinine, Ser: 0.97 mg/dL (ref 0.61–1.24)
GFR, Estimated: >60 mL/min (ref >60)
Glucose, Bld: 89 mg/dL (ref 70–99)
Potassium: 3 mmol/L — ABNORMAL LOW (ref 3.5–5.1)
Sodium: 141 mmol/L (ref 135–145)

## 2023-10-29 LAB — ALBUMIN, PLEURAL OR PERITONEAL FLUID: Albumin, Fluid: <1.5 g/dL

## 2023-10-29 LAB — ALBUMIN: Albumin: 3.2 g/dL — ABNORMAL LOW (ref 3.5–5.0)

## 2023-10-29 LAB — LACTATE DEHYDROGENASE, PLEURAL OR PERITONEAL FLUID: LD, Fluid: 61 U/L — ABNORMAL HIGH (ref 3–23)

## 2023-10-29 LAB — MAGNESIUM: Magnesium: 2.1 mg/dL (ref 1.7–2.4)

## 2023-10-29 LAB — GLUCOSE, PLEURAL OR PERITONEAL FLUID: Glucose, Fluid: 100 mg/dL

## 2023-10-29 LAB — LACTATE DEHYDROGENASE: LDH: 204 U/L — ABNORMAL HIGH (ref 98–192)

## 2023-10-29 MED ORDER — POTASSIUM CHLORIDE CRYS ER 20 MEQ PO TBCR
40.0000 meq | EXTENDED_RELEASE_TABLET | Freq: Every day | ORAL | Status: DC
  Administered 2023-10-29 – 2023-10-30 (×2): 40 meq via ORAL
  Filled 2023-10-29 (×2): qty 2

## 2023-10-29 MED ORDER — VERAPAMIL HCL ER 180 MG PO TBCR
180.0000 mg | EXTENDED_RELEASE_TABLET | Freq: Every evening | ORAL | Status: DC
Start: 2023-10-29 — End: 2023-11-03
  Administered 2023-10-29 – 2023-11-02 (×5): 180 mg via ORAL
  Filled 2023-10-29 (×6): qty 1

## 2023-10-29 MED ORDER — FUROSEMIDE 10 MG/ML IJ SOLN: 40.0000 mg | Freq: Two times a day (BID) | INTRAMUSCULAR | Status: DC

## 2023-10-29 MED ORDER — LIDOCAINE HCL 1 % IJ SOLN
INTRAMUSCULAR | Status: AC
  Filled 2023-10-29: qty 20

## 2023-10-29 MED ORDER — VERAPAMIL HCL ER 240 MG PO TBCR
240.0000 mg | EXTENDED_RELEASE_TABLET | Freq: Every evening | ORAL | Status: DC
  Filled 2023-10-29: qty 1

## 2023-10-29 MED ORDER — ESCITALOPRAM OXALATE 10 MG PO TABS
10.0000 mg | ORAL_TABLET | Freq: Every day | ORAL | Status: DC
  Administered 2023-10-29: 10 mg via ORAL
  Filled 2023-10-29 (×3): qty 1

## 2023-10-29 MED ORDER — FUROSEMIDE 20 MG PO TABS
20.0000 mg | ORAL_TABLET | Freq: Two times a day (BID) | ORAL | Status: DC
  Administered 2023-10-29 – 2023-11-02 (×8): 20 mg via ORAL
  Filled 2023-10-29 (×8): qty 1

## 2023-10-29 NOTE — Care Management Obs Status (Signed)
MEDICARE OBSERVATION STATUS NOTIFICATION   Patient Details  Name: Eric Lambert MRN: [id number] Date of Birth: 1929/11/03   Medicare Observation Status Notification Given:       Howell Rucks, RN 10/29/2023, 1:14 PM

## 2023-10-29 NOTE — Procedures (Addendum)
Ultrasound-guided diagnostic and therapeutic right thoracentesis performed yielding 1.5 liters of yellow fluid. No immediate complications. Follow-up chest x-ray pending.The fluid was sent to the lab for preordered studies. EBL none.       

## 2023-10-29 NOTE — TOC CM/SW Note (Signed)
Transition of Care [facility]) - Inpatient Brief Assessment   Patient Details  Name: Eric Lambert MRN: [id number] Date of Birth: [birth year]  Transition of Care [facility]) CM/SW Contact:    Howell Rucks, RN Phone Number: 10/29/2023, 1:18 PM   Clinical Narrative: Met with pt at bedside to introduce role of TOC/NCM and review for dc planning. Pt/spouse reports pt has an established PCP and pharmacy, no current home care services,  home DME: walker, cane, rollator. Pt reports he feels safe returning home with support from his spouse,  confirmed transportation is available at discharge. MOON presented to pt/spouse, pt reports he feels that observation status was not made clear to him on admission, declined to sign MOON form, copy of MOON form given to patient. TOC Brief Assessment completed. No TOC needs identified at this time.     Transition of Care Asessment: Insurance and Status: Insurance coverage has been reviewed Patient has primary care physician: Yes Home environment has been reviewed: resides with spouse in private residence Prior level of function:: Independent, uses Librarian, academic as needed Prior/Current Home Services: No current home services Social Determinants of Health Reivew: SDOH reviewed no interventions necessary Readmission risk has been reviewed: Yes Transition of care needs: no transition of care needs at this time

## 2023-10-29 NOTE — Progress Notes (Signed)
Mobility Specialist - Progress Note   10/29/23 1004  Oxygen Therapy  SpO2 (!) 83 %  O2 Device Nasal Cannula  O2 Flow Rate (L/min) 6 L/min  Patient Activity (if Appropriate) Ambulating  Mobility  Activity Ambulated independently in hallway  Level of Assistance Standby assist, set-up cues, supervision of patient - no hands on  Assistive Device None  Distance Ambulated (ft) 100 ft  Activity Response Tolerated well  Mobility Referral Yes  $Mobility charge 1 Mobility  Mobility Specialist Start Time (ACUTE ONLY) [id number]  Mobility Specialist Stop Time (ACUTE ONLY) 0956  Mobility Specialist Time Calculation (min) (ACUTE ONLY) 17 min   Pt received in bed and agreeable to mobility. Pt took x1 standing rest break d/t SOB & SpO2 dropping to 83%. Encouraged pursed lip breaths allowing O2 to come back up to 91%. No complaints during session. Pt to bed after session with all needs met.    Pre-mobility: 100 HR, 91% SpO2 During mobility: 123 HR, 83-91%  SpO2 Post-mobility: 117 HR, 93%  SPO2  Chief Technology Officer

## 2023-10-29 NOTE — Plan of Care (Addendum)
Patient Eric Lambert 6, VSS and no complaints of pain. Can ambulate with cane and assistance. Remains on 5L [place]. Thoracentesis today, 1.5 L fluid removed. Appetite fair. Spouse at bedside. Pending pleural fluid lab results. Gentle diuresing, voids with urinal at bedside.   Problem: Education: Goal: Knowledge of General Education information will improve Description: Including pain rating scale, medication(s)/side effects and non-pharmacologic comfort measures Outcome: Progressing   Problem: Health Behavior/Discharge Planning: Goal: Ability to manage health-related needs will improve Outcome: Progressing   Problem: Clinical Measurements: Goal: Ability to maintain clinical measurements within normal limits will improve Outcome: Progressing Goal: Will remain free from infection Outcome: Progressing Goal: Diagnostic test results will improve Outcome: Progressing Goal: Respiratory complications will improve Outcome: Progressing Goal: Cardiovascular complication will be avoided Outcome: Progressing   Problem: Activity: Goal: Risk for activity intolerance will decrease Outcome: Progressing   Problem: Nutrition: Goal: Adequate nutrition will be maintained Outcome: Progressing   Problem: Coping: Goal: Level of anxiety will decrease Outcome: Progressing   Problem: Elimination: Goal: Will not experience complications related to bowel motility Outcome: Progressing Goal: Will not experience complications related to urinary retention Outcome: Progressing   Problem: Pain Management: Goal: General experience of comfort will improve Outcome: Progressing   Problem: Safety: Goal: Ability to remain free from injury will improve Outcome: Progressing   Problem: Skin Integrity: Goal: Risk for impaired skin integrity will decrease Outcome: Progressing

## 2023-10-29 NOTE — Progress Notes (Signed)
HOSPITALIST ROUNDING NOTE Sari Petrou [facility] WUJ:[id number]  DOB: 01-23-1929  DOA: 10/28/2023  PCP: [name], MD  10/29/2023,9:08 AM   LOS: 0 days      Code Status: Full From: Home  current Dispo: Unclear     89 year old white male Known history of A-fib complicated by heart block heart failure-s/p Social worker. [place]-this was in the setting of bacterial endocarditis in 1970 Previous left hip osteoarthritis status post left hip arthroplasty Possible dysphagia stricture status postdilatation by GI  Recent hospitalization 11/11-11/14/2024 DOE, PND, bilateral pleural effusions CT confirmed more right side greater than left side patient was placed on diuretics EF was 60-65% with moderate mitral valve regurg diuresed 1.8 L--hospitalization complicated by mild renal insufficiency  Developed worsening DOE nonproductive cough despite continue diuretics came to ED 12/5 IR consulted-1.5 L thoracentesis performed  Plan  Recurrent pleural effusion despite recent admission for diuresis--suspect heart failure etiology Status post thoracentesis as above-LDH body fluid cell count body fluid culture and pathology as well as cytology are all pending Chest x-ray repeat shows no pneumothorax Resume diuresis with Lasix 40 twice daily--at last office visit with Dr. Durene Cal weight was 149 pounds and today has dropped to 145--- fluid status is inaccurate get standing weight and strict I/O   Afib/Heart block + PPM HFpEF last EF 60-65% with RV dilatation mild pulmonary artery elevation degenerative mitral valve with moderate regurg Keep on tele overnight--- no need to repeat echo at this time but does need follow-up in case this turns out to be heart failure as may be a candidate for valve repair-will forward to his cardiologist at discharge Eliquis resumed 5 bid--resumed rate control with verapamil CR 240  Hypokalemia Replace with Kdur 40-recheck labs in a.m.-magnesium is  normalized Periodic labs  CKD 2 baseline CKD 3B Watch creatinine as diuresing  Previous OSA no longer using CPAP May require reinitiation of this in the outpatient setting  Depression Continue Lexapro 10, trazodone 25 at bedtime as needed  DVT prophylaxis: Eliquis  Status is: Observation The patient will require care spanning > 2 midnights and should be moved to inpatient because:   Will require diuresis and further workup    Subjective: Awake coherent feels fair after receiving Lasix Tells me he has been compliant on his Lasix at home wife gives medications   Objective + exam Vitals:   10/28/23 1723 10/28/23 2103 10/29/23 0103 10/29/23 0537  BP:  118/70 124/73 (!) 125/98  Pulse:  69 70 77  Resp:  16 15 15   Temp:  98.2 F (36.8 C) 98 F (36.7 C) 97.9 F (36.6 C)  TempSrc:  Oral Oral Oral  SpO2:  98% 96% 95%  Weight: 66.6 kg     Height: 5\' 10"  (1.778 m)      Filed Weights   10/28/23 1723  Weight: 66.6 kg    Examination:  Awake coherent no distress looks fair on oxygen Chest is clear no wheeze rales rhonchi Slight JVD on monitors but irregular paced rhythm PVC HSM noted Abdomen soft no rebound No lower extremity edema   Data Reviewed: reviewed   CBC    Component Value Date/Time   WBC 6.9 10/29/2023 0429   RBC 4.40 10/29/2023 0429   HGB 13.3 10/29/2023 0429   HGB 13.9 09/08/2023 1448   HCT 40.6 10/29/2023 0429   HCT 42.2 09/08/2023 1448   PLT 125 (L) 10/29/2023 0429   PLT 142 (L) 09/08/2023 1448   MCV 92.3 10/29/2023 0429   MCV  93 09/08/2023 1448   MCH 30.2 10/29/2023 0429   MCHC 32.8 10/29/2023 0429   RDW 15.8 (H) 10/29/2023 0429   RDW 12.7 09/08/2023 1448   LYMPHSABS 1.4 10/28/2023 1415   LYMPHSABS 1.6 01/13/2017 1533   MONOABS 0.6 10/28/2023 1415   EOSABS 0.1 10/28/2023 1415   EOSABS 0.1 01/13/2017 1533   BASOSABS 0.0 10/28/2023 1415   BASOSABS 0.0 01/13/2017 1533      Latest Ref Rng & Units 10/29/2023    4:29 AM 10/28/2023    2:15 PM  10/13/2023   10:37 AM  CMP  Glucose 70 - 99 mg/dL 89  92  161   BUN 8 - 23 mg/dL 32  36  40   Creatinine 0.61 - 1.24 mg/dL 0.96  0.45  4.09   Sodium 135 - 145 mmol/L 141  141  140   Potassium 3.5 - 5.1 mmol/L 3.0  3.7  3.9   Chloride 98 - 111 mmol/L 106  103  103   CO2 22 - 32 mmol/L 27  25  28    Calcium 8.9 - 10.3 mg/dL 8.3  8.9  9.2   Total Protein 6.5 - 8.1 g/dL  6.9  6.6   Total Bilirubin <1.2 mg/dL  1.4  1.0   Alkaline Phos 38 - 126 U/L  79  63   AST 15 - 41 U/L  28  21   ALT 0 - 44 U/L  28  23     Scheduled Meds:  apixaban  5 mg Oral BID   famotidine  40 mg Oral QHS   potassium chloride  40 mEq Oral Daily   Continuous Infusions:  Time  46  [name], MD  [facility]

## 2023-10-30 ENCOUNTER — Encounter (HOSPITAL_COMMUNITY): Payer: Self-pay | Admitting: Internal Medicine

## 2023-10-30 ENCOUNTER — Inpatient Hospital Stay (HOSPITAL_COMMUNITY): Payer: Medicare HMO

## 2023-10-30 DIAGNOSIS — I4821 Permanent atrial fibrillation: Secondary | ICD-10-CM

## 2023-10-30 DIAGNOSIS — J90 Pleural effusion, not elsewhere classified: Secondary | ICD-10-CM

## 2023-10-30 DIAGNOSIS — I340 Nonrheumatic mitral (valve) insufficiency: Secondary | ICD-10-CM

## 2023-10-30 DIAGNOSIS — I5033 Acute on chronic diastolic (congestive) heart failure: Secondary | ICD-10-CM | POA: Diagnosis not present

## 2023-10-30 LAB — CBC WITH DIFFERENTIAL/PLATELET
Abs Immature Granulocytes: 0.02 10*3/uL (ref 0.00–0.07)
Basophils Absolute: 0 10*3/uL (ref 0.0–0.1)
Basophils Relative: 0 %
Eosinophils Absolute: 0.1 10*3/uL (ref 0.0–0.5)
Eosinophils Relative: 2 %
HCT: 41.5 % (ref 39.0–52.0)
Hemoglobin: 13.3 g/dL (ref 13.0–17.0)
Immature Granulocytes: 0 %
Lymphocytes Relative: 16 %
Lymphs Abs: 1.1 10*3/uL (ref 0.7–4.0)
MCH: 30.1 pg (ref 26.0–34.0)
MCHC: 32 g/dL (ref 30.0–36.0)
MCV: 93.9 fL (ref 80.0–100.0)
Monocytes Absolute: 0.8 10*3/uL (ref 0.1–1.0)
Monocytes Relative: 11 %
Neutro Abs: 5 10*3/uL (ref 1.7–7.7)
Neutrophils Relative %: 71 %
Platelets: 127 10*3/uL — ABNORMAL LOW (ref 150–400)
RBC: 4.42 MIL/uL (ref 4.22–5.81)
RDW: 15.5 % (ref 11.5–15.5)
WBC: 7.1 10*3/uL (ref 4.0–10.5)
nRBC: 0 % (ref 0.0–0.2)

## 2023-10-30 LAB — COMPREHENSIVE METABOLIC PANEL
ALT: 19 U/L (ref 0–44)
AST: 17 U/L (ref 15–41)
Albumin: 2.9 g/dL — ABNORMAL LOW (ref 3.5–5.0)
Alkaline Phosphatase: 68 U/L (ref 38–126)
Anion gap: 9 (ref 5–15)
BUN: 35 mg/dL — ABNORMAL HIGH (ref 8–23)
CO2: 25 mmol/L (ref 22–32)
Calcium: 8.2 mg/dL — ABNORMAL LOW (ref 8.9–10.3)
Chloride: 103 mmol/L (ref 98–111)
Creatinine, Ser: 1.04 mg/dL (ref 0.61–1.24)
GFR, Estimated: >60 mL/min (ref >60)
Glucose, Bld: 108 mg/dL — ABNORMAL HIGH (ref 70–99)
Potassium: 3.3 mmol/L — ABNORMAL LOW (ref 3.5–5.1)
Sodium: 137 mmol/L (ref 135–145)
Total Bilirubin: 1.4 mg/dL — ABNORMAL HIGH (ref <1.2)
Total Protein: 5.5 g/dL — ABNORMAL LOW (ref 6.5–8.1)

## 2023-10-30 LAB — BODY FLUID CELL COUNT WITH DIFFERENTIAL
Lymphs, Fluid: 89 %
Monocyte-Macrophage-Serous Fluid: 10 % — ABNORMAL LOW (ref 50–90)
Neutrophil Count, Fluid: 1 % (ref 0–25)
Total Nucleated Cell Count, Fluid: 340 uL (ref 0–1000)

## 2023-10-30 MED ORDER — POTASSIUM CHLORIDE CRYS ER 20 MEQ PO TBCR
40.0000 meq | EXTENDED_RELEASE_TABLET | Freq: Two times a day (BID) | ORAL | Status: DC
  Administered 2023-10-30 – 2023-11-02 (×6): 40 meq via ORAL
  Filled 2023-10-30 (×6): qty 2

## 2023-10-30 NOTE — Consult Note (Signed)
Cardiology Consultation   Patient ID: Eric Lambert MRN: [id number]; DOB: 1928/12/01  Admit date: 10/28/2023 Date of Consult: 10/30/2023  PCP:  Eric Majestic, MD   [facility] Providers Cardiologist:  Sherryl Manges, MD  Electrophysiologist:  Sherryl Manges, MD  {  Patient Profile:   Eric Lambert is a 89 y.o. 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 Mahala Menghini.  History of Present Illness:   Eric Lambert is followed by Dr. Graciela Husbands.  Previously had tachybradycardia syndrome and resolved tachycardia induced cardiomyopathy with PPM since 2013.  Has preserved EF with moderate mitral regurgitation.  He was recently seen by our group earlier this month for small to moderate bilateral pleural effusions and underwent diuresis, then discharged on p.o. Lasix 40 mg daily.  Also seen by GI previously in Jan 2024 for mild to moderate esophageal dysmotility/GERD with deferment of EGD felt to be high risk and would only consider if symptoms were to worsen.   Currently patient being evaluated for recurrent pleural effusions.  He had R sided thoracentesis with 1.5 L removed on 10/29/2023.  Still pending fluid analysis/cultures.  There is some concern for right sided pneumonia but has not exhibited fever or leukocytosis.  Cardiology asked to further manage heart failure with concerns of hypotension.  His verapamil was reduced to 180 mg.  Since patient's discharge he had started to feel dyspneic with exertion, orthopneic, and has had this persistent nonproductive cough for several months now.  His memory issues make interview somewhat challenging however he reports that he generally feels better now and perhaps at baseline.  We walked together in the  hall with his cane when he reported feeling decently well, he had some shortness of breath with this but not disproportionate to his baseline.  He was also only breathing through his nose.  Otherwise ambulated fine.  Prior to a month ago he was ambulatory and mowed his own grass and took care of himself and his wife at home.  No chest pain or peripheral edema.  Reports no falls.  Chest x-ray shows increased interstitial opacity consistent with acute interstitial edema and regressed bilateral pleural effusions.  BNP 243.  Potassium 2.28, creatinine 1.04 BUN 35.  Albumin 2.9.  LDH 204.  WBC 7.1.  Hemoglobin 13.3.  Still awaiting cytology on pleural fluid. Trops 33-33.   Past Medical History:  Diagnosis Date   Allergy    Arthritis    Atrial fibrillation -permanent    BPH (benign prostatic hyperplasia)    Cancer (HCC)    HX OF SKIN CANCER    CHF (congestive heart failure) (HCC)    resolved after pacemaker - tachycardia induced   Complete heart block (HCC)    Dyspnea    mild   ED (erectile dysfunction)    GERD (gastroesophageal reflux disease)    GLUCOSE INTOLERANCE 10/22/2007   no recent issues   Heart murmur    OSA (obstructive sleep apnea)    NO CPAP    Pacemaker BSX    dual   Pneumonia    HX OF SEVERAL TIMES AS A CHILD    PONV (postoperative nausea and vomiting)    at age 57    Presence of permanent cardiac pacemaker    SUBACUTE BACTERIAL ENDOCARDITIS 1970s  Past Surgical History:  Procedure Laterality Date   ESOPHAGOGASTRODUODENOSCOPY (EGD) WITH PROPOFOL N/A 01/07/2022   Procedure: ESOPHAGOGASTRODUODENOSCOPY (EGD) WITH PROPOFOL;  Surgeon: Beverley Fiedler, MD;  Location: [facility];  Service: Gastroenterology;  Laterality: N/A;   IMPACTION REMOVAL  01/07/2022   Procedure: IMPACTION REMOVAL;  Surgeon: Beverley Fiedler, MD;  Location: [facility];  Service: Gastroenterology;;   INGUINAL HERNIA REPAIR Bilateral 04/08/2021   Procedure: LAPAROSCOPIC BILATERAL INGUINAL HERNIA REPAIR  WITH MESH;  Surgeon: Kinsinger, De Blanch, MD;  Location: [facility];  Service: General;  Laterality: Bilateral;   INSERT / REPLACE / REMOVE PACEMAKER     PACEMAKER GENERATOR CHANGE N/A 05/07/2012   Procedure: PACEMAKER GENERATOR CHANGE;  Surgeon: Duke Salvia, MD;  Location: [facility] CATH LAB;  Service: Cardiovascular;  Laterality: N/A;   PACEMAKER PLACEMENT     PARTIAL HIP ARTHROPLASTY     2008   PPM GENERATOR CHANGEOUT N/A 09/16/2023   Procedure: PPM GENERATOR CHANGEOUT;  Surgeon: Duke Salvia, MD;  Location: [facility] INVASIVE CV LAB;  Service: Cardiovascular;  Laterality: N/A;   TOTAL HIP ARTHROPLASTY Left 01/07/2022   Procedure: TOTAL HIP ARTHROPLASTY ANTERIOR APPROACH;  Surgeon: Sheral Apley, MD;  Location: [facility];  Service: Orthopedics;  Laterality: Left;    Inpatient Medications: Scheduled Meds:  apixaban  5 mg Oral BID   escitalopram  10 mg Oral Daily   famotidine  40 mg Oral QHS   furosemide  20 mg Oral BID   potassium chloride  40 mEq Oral BID   verapamil  180 mg Oral QPM   Continuous Infusions:  PRN Meds: acetaminophen **OR** acetaminophen, albuterol, ondansetron **OR** ondansetron (ZOFRAN) IV, traZODone  Allergies:   No Known Allergies  Social History:   Social History   Socioeconomic History   Marital status: Married    Spouse name: Not on file   Number of children: 2   Years of education: Not on file   Highest education level: Not on file  Occupational History   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 2 children. 1 grandkid. 1 greatgrandchild.       Retired Immunologist- still works some in the  [employer]: reading, plays bridge on internet, watch reruns, enjoys talking politics   Social Determinants of Corporate investment banker Strain: Not on file  Food Insecurity: No Food Insecurity (10/28/2023)   Hunger Vital Sign    Worried About Running Out of Food in the Last Year: Never true    Ran Out of Food in the Last Year: Never true  Transportation Needs: No Transportation Needs (10/28/2023)   PRAPARE - Administrator, Civil Service (Medical): No    Lack of Transportation (Non-Medical): No  Physical Activity: Not on file  Stress: Not on file  Social Connections: Not on file  Intimate Partner Violence: Not At Risk (10/28/2023)   Humiliation, Afraid, Rape, and Kick questionnaire    Fear of Current or Ex-Partner: No    Emotionally Abused: No  Physically Abused: No    Sexually Abused: No    Family History:   Family History  Problem Relation Age of Onset   Prostate cancer Father    Diabetes Brother    Prostate cancer Brother    Brain cancer Brother    Heart disease Other        mothers side men- strokes and heart attacks   Colon cancer Neg Hx    Pancreatic cancer Neg Hx    Stomach cancer Neg Hx      ROS:  Please see the history of present illness.  All other ROS reviewed and negative.     Physical Exam/Data:   Vitals:   10/30/23 0600 10/30/23 0700 10/30/23 0730 10/30/23 0929  BP:      Pulse:    78  Resp: (!) 22 17 20    Temp:      TempSrc:      SpO2:   94% 97%  Weight:      Height:        Intake/Output Summary (Last 24 hours) at 10/30/2023 1152 Last data filed at 10/29/2023 2233 Gross per 24 hour  Intake 240 ml  Output 300 ml  Net -60 ml      10/30/2023    5:00 AM 10/28/2023    5:23 PM 10/13/2023    9:21 AM  Last 3 Weights  Weight (lbs) 141 lb 8.6 oz 146 lb 13.2 oz 149 lb  Weight (kg) 64.2 kg 66.6 kg 67.586 kg     Body mass index is 20.31 kg/m.  General:  Well nourished, well developed, in no acute distress HEENT:  normal Neck: no JVD Vascular: No carotid bruits; Distal pulses 2+ bilaterally Cardiac:  normal S1, S2; RRR; no murmur  Lungs: Diminished breath sounds, slight crackles Abd: soft, nontender, no hepatomegaly  Ext: no edema Musculoskeletal:  No deformities, BUE and BLE strength normal and equal Skin: warm and dry  Neuro:  CNs 2-12 intact, no focal abnormalities noted Psych:  Normal affect   EKG:  The EKG was personally reviewed and demonstrates: Atrial fibrillation, heart rate 93.  PVCs.  Nonspecific T wave changes. Telemetry:  Telemetry was personally reviewed and demonstrates: Atrial fibrillation with heart rates in the 80s.  Intermittently V-paced.  Relevant CV Studies: Echocardiogram 10/06/2023  1. Left ventricular ejection fraction, by estimation, is 60 to 65%. The  left ventricle has normal function. The left ventricle has no regional  wall motion abnormalities. The left ventricular internal cavity size was  mildly dilated. Left ventricular  diastolic function could not be evaluated.   2. Right ventricular systolic function is normal. The right ventricular  size is moderately enlarged. There is mildly elevated pulmonary artery  systolic pressure. The estimated right ventricular systolic pressure is  36.2 mmHg.   3. Left atrial size was massively dilated.   4. Right atrial size was severely dilated.   5. The mitral valve is degenerative. Moderate mitral valve regurgitation.  No evidence of mitral stenosis. There is mild late systolic prolapse of  the middle scallop of the posterior leaflet of the mitral valve.   6. The aortic valve is tricuspid. There is moderate calcification of the  aortic valve. There is moderate thickening of the aortic valve. Aortic  valve regurgitation is not visualized. Aortic valve  sclerosis/calcification is present, without any evidence  of aortic stenosis.   7. The inferior vena cava is normal in size with greater than 50%  respiratory variability,  suggesting right atrial pressure  of 3 mmHg.    Laboratory Data:  High Sensitivity Troponin:   Recent Labs  Lab 10/05/23 0924 10/05/23 1155  TROPONINIHS 33* 33*     Chemistry Recent Labs  Lab 10/28/23 1415 10/29/23 0429 10/30/23 0403  NA 141 141 137  K 3.7 3.0* 3.3*  CL 103 106 103  CO2 25 27 25   GLUCOSE 92 89 108*  BUN 36* 32* 35*  CREATININE 1.05 0.97 1.04  CALCIUM 8.9 8.3* 8.2*  MG  --  2.1  --   GFRNONAA >60 >60 >60  ANIONGAP 13 8 9     Recent Labs  Lab 10/28/23 1415 10/29/23 1356 10/30/23 0403  PROT 6.9  --  5.5*  ALBUMIN 3.8 3.2* 2.9*  AST 28  --  17  ALT 28  --  19  ALKPHOS 79  --  68  BILITOT 1.4*  --  1.4*   Lipids No results for input(s): "CHOL", "TRIG", "HDL", "LABVLDL", "LDLCALC", "CHOLHDL" in the last 168 hours.  Hematology Recent Labs  Lab 10/28/23 1415 10/29/23 0429 10/30/23 0403  WBC 7.4 6.9 7.1  RBC 4.95 4.40 4.42  HGB 14.8 13.3 13.3  HCT 45.5 40.6 41.5  MCV 91.9 92.3 93.9  MCH 29.9 30.2 30.1  MCHC 32.5 32.8 32.0  RDW 15.9* 15.8* 15.5  PLT 128* 125* 127*   Thyroid No results for input(s): "TSH", "FREET4" in the last 168 hours.  BNP Recent Labs  Lab 10/28/23 1415  BNP 243.6*    DDimer No results for input(s): "DDIMER" in the last 168 hours.   Radiology/Studies:  DG CHEST PORT 1 VIEW  Result Date: 10/30/2023 CLINICAL DATA:  89 year old male status post right side thoracentesis yesterday. EXAM: PORTABLE CHEST 1 VIEW COMPARISON:  Post thoracentesis portable chest 10/29/2023 and earlier. FINDINGS: Portable AP view at 0802 hours. Stable large lung volumes. Stable cardiomegaly and left chest pacemaker. Bilateral veiling lung base opacity, remains decreased on the right compared to 10/28/2023. Increased pulmonary interstitial opacity, vascularity diffusely. No pneumothorax. No air bronchograms. Stable visualized osseous structures. IMPRESSION: Increased interstitial opacity since yesterday favored to be acute interstitial edema. Residual  bilateral pleural effusions, regressed on the right following thoracentesis yesterday. No pneumothorax. Electronically Signed   By: Odessa Fleming M.D.   On: 10/30/2023 11:16   DG Chest 1 View  Result Date: 10/29/2023 CLINICAL DATA:  Status post thoracentesis. EXAM: CHEST  1 VIEW portable semi upright COMPARISON:  10/28/2023. FINDINGS: Decreasing right effusion with trace residual. No pneumothorax. Persistent left effusion with opacity. Stable cardiopericardial silhouette. Calcified aorta. No edema. Overlapping cardiac leads. Left upper chest pacemaker with leads along the right side of the heart. IMPRESSION: Decreasing right effusion post thoracentesis.  No pneumothorax. Electronically Signed   By: Karen Kays M.D.   On: 10/29/2023 13:14   US THORACENTESIS ASP PLEURAL SPACE W/IMG GUIDE  Result Date: 10/29/2023 INDICATION: Patient with history of CHF, atrial fibrillation, chronic kidney disease, bilateral pleural effusions right greater than left. Request received for diagnostic and therapeutic right thoracentesis. EXAM: ULTRASOUND GUIDED DIAGNOSTIC AND THERAPEUTIC RIGHT THORACENTESIS MEDICATIONS: 8 mL 1% lidocaine COMPLICATIONS: None immediate. PROCEDURE: An ultrasound guided thoracentesis was thoroughly discussed with the patient and questions answered. The benefits, risks, alternatives and complications were also discussed. The patient understands and wishes to proceed with the procedure. Written consent was obtained. Ultrasound was performed to localize and mark an adequate pocket of fluid in the right chest. The area was then prepped and draped in the normal sterile fashion. 1% Lidocaine was  used for local anesthesia. Under ultrasound guidance a 6 Fr Safe-T-Centesis catheter was introduced. Thoracentesis was performed. The catheter was removed and a dressing applied. FINDINGS: A total of approximately 1.5 liters of yellow fluid was removed. Samples were sent to the laboratory as requested by the clinical  team. IMPRESSION: Successful ultrasound guided diagnostic and therapeutic right thoracentesis yielding 1.5 liters of pleural fluid. Performed by: [name] Electronically Signed   By: Marliss Coots M.D.   On: 10/29/2023 13:08   DG Chest 2 View  Result Date: 10/28/2023 CLINICAL DATA:  Shortness of breath, increasing with exertion. Recent change in diuretic dosage. EXAM: CHEST - 2 VIEW COMPARISON:  Radiographs 10/06/2023 and 10/05/2023.  CT 10/05/2023. FINDINGS: Left subclavian pacemaker leads appear unchanged, projecting over the right atrium and right ventricle. The heart size and mediastinal contours are stable. There are enlarging right greater than left pleural effusions with increasing probable compressive atelectasis at both lung bases. There is vascular congestion without overt pulmonary edema. No confluent airspace disease or pneumothorax. The bones appear unchanged. IMPRESSION: Enlarging right greater than left pleural effusions with increasing probable compressive atelectasis at both lung bases. Vascular congestion without overt pulmonary edema. Electronically Signed   By: Carey Bullocks M.D.   On: 10/28/2023 14:42     Assessment and Plan:   Recurrent right greater than left pleural effusions Chronic HFpEF Degenerative MV Regurg Thought to be related to his heart failure however this seems does not entirely seem consistent with this current presentation.  He has preserved EF, severely dilated biatria, and moderate MR, although still possible.  I wonder with his history of GERD and issues with dysphagia that he could be aspirating more frequently and question if he might have some pneumonia despite negative lab values.  Chest x-ray does show increased interstitial opacity.  He looks euvolemic otherwise, ambulates decently not significantly short of breath orthopneic anymore.  Fluid analysis of his pleural fluid will be interesting, and hopefully revealing.  Will discuss with MD. He is  on p.o. Lasix 20 mg twice daily, not sure why twice daily dosing when he was discharged on 40 daily from his last discharge. Will see how his output is on this dose but would likely switch back to 40 for dose related curve.  Permanent atrial fibrillation Tachybradycardia syndrome/complete heart block status post PPM Rate controlled with heart rates in the 80s, intermittently V paced. Currently on verapamil 180 mg and Eliquis 5 mg twice daily.  Currently has 1 point for reduced dose Eliquis with age, if his weight or renal function declines needs to be switched to 2.5 mg.  Hypokalemia 3.3.  Repleted per primary team.    Risk Assessment/Risk Scores:   New York Heart Association (NYHA) Functional Class NYHA Class III  CHA2DS2-VASc Score = 4   This indicates a 4.8% annual risk of stroke. The patient's score is based upon: CHF History: 1 HTN History: 1 Diabetes History: 0 Stroke History: 0 Vascular Disease History: 0 Age Score: 2 Gender Score: 0    For questions or updates, please contact [facility] Please consult [email or web address] for contact info under    Signed, Abagail Kitchens, PA-C  10/30/2023 11:52 AM

## 2023-10-30 NOTE — Plan of Care (Signed)

## 2023-10-30 NOTE — Progress Notes (Signed)
HOSPITALIST ROUNDING NOTE Eric Lambert ZOX:[id number]  DOB: [birth year]  DOA: 10/28/2023  PCP: [name], MD  10/30/2023,10:10 AM   LOS: 1 day      Code Status: Full From: Home  current Dispo: Unclear     89 year old white male Known history of A-fib complicated by heart block heart failure-s/p Social worker. Klein-this was in the setting of bacterial endocarditis in 1970 Previous left hip osteoarthritis status post left hip arthroplasty Possible dysphagia stricture status postdilatation by GI  Recent hospitalization 11/11-11/14/2024 DOE, PND, bilateral pleural effusions CT confirmed more right side greater than left side patient was placed on diuretics EF was 60-65% with moderate mitral valve regurg diuresed 1.8 L--hospitalization complicated by mild renal insufficiency  Developed worsening DOE nonproductive cough despite continue diuretics came to ED 12/5 IR consulted-1.5 L thoracentesis performed 12/6 cardiology consulted  Plan  Recurrent pleural effusions DDx concern for right-sided pneumonia No cough fever chills or other issues and not coughing when eating-feel this is a rapid reaccumulation of right-sided fluid Thoracentesis appears transudative given lights criteria we can follow cultures for completion sake 2/2 hypotension Lasix now 20 bid-weight down 66 to 64 kg only -630 since admission-will fluid restrict but there is risk of azotemia with this He may require discussion with hospice or palliative care and I have alerted to this to some degree Cardiology consulted did not emergently see He is not appearing to require oxygen at this time we will do another desat screen later this afternoon  A-fib/heart block/PPM HFpEF 60-65% pulmonary dilatation Resumed on Eliquis 5 twice daily may need to dose adjust Cut back dose of Verapamil given hypotension to 180  Hypokalemia Continue replacement with K-Dur 40 but increase to twice daily-check a.m.  magnesium  CKD 2 baseline AKI on admission Monitor trends  Depression continue Lexapro 10 trazodone 25  DVT prophylaxis: Eliquis  Status is: Observation The patient will require care spanning > 2 midnights and should be moved to inpatient because:   Will require diuresis and further workup    Subjective:  Feels about the same-blood pressure earlier this morning was about 90/40 Not requiring oxygen at this time No cough fever chills nausea No sputum   Objective + exam Vitals:   10/30/23 0600 10/30/23 0700 10/30/23 0730 10/30/23 0929  BP:      Pulse:    78  Resp: (!) 22 17 20    Temp:      TempSrc:      SpO2:   94% 97%  Weight:      Height:       Filed Weights   10/28/23 1723 10/30/23 0500  Weight: 66.6 kg 64.2 kg    Examination:  Awake coherent Chest seems clear to my exam S1-S2 no murmur on monitors seems to be A-fib with paced rhythm  Abdomen soft no rebound no guarding ROM intact No lower extremity edema No JVD   Data Reviewed: reviewed   CBC    Component Value Date/Time   WBC 7.1 10/30/2023 0403   RBC 4.42 10/30/2023 0403   HGB 13.3 10/30/2023 0403   HGB 13.9 09/08/2023 1448   HCT 41.5 10/30/2023 0403   HCT 42.2 09/08/2023 1448   PLT 127 (L) 10/30/2023 0403   PLT 142 (L) 09/08/2023 1448   MCV 93.9 10/30/2023 0403   MCV 93 09/08/2023 1448   MCH 30.1 10/30/2023 0403   MCHC 32.0 10/30/2023 0403   RDW 15.5 10/30/2023 0403   RDW 12.7 09/08/2023 1448  LYMPHSABS 1.1 10/30/2023 0403   LYMPHSABS 1.6 01/13/2017 1533   MONOABS 0.8 10/30/2023 0403   EOSABS 0.1 10/30/2023 0403   EOSABS 0.1 01/13/2017 1533   BASOSABS 0.0 10/30/2023 0403   BASOSABS 0.0 01/13/2017 1533      Latest Ref Rng & Units 10/30/2023    4:03 AM 10/29/2023    4:29 AM 10/28/2023    2:15 PM  CMP  Glucose 70 - 99 mg/dL 161  89  92   BUN 8 - 23 mg/dL 35  32  36   Creatinine 0.61 - 1.24 mg/dL 0.96  0.45  4.09   Sodium 135 - 145 mmol/L 137  141  141   Potassium 3.5 - 5.1 mmol/L  3.3  3.0  3.7   Chloride 98 - 111 mmol/L 103  106  103   CO2 22 - 32 mmol/L 25  27  25    Calcium 8.9 - 10.3 mg/dL 8.2  8.3  8.9   Total Protein 6.5 - 8.1 g/dL 5.5   6.9   Total Bilirubin <1.2 mg/dL 1.4   1.4   Alkaline Phos 38 - 126 U/L 68   79   AST 15 - 41 U/L 17   28   ALT 0 - 44 U/L 19   28     Scheduled Meds:  apixaban  5 mg Oral BID   escitalopram  10 mg Oral Daily   famotidine  40 mg Oral QHS   furosemide  20 mg Oral BID   potassium chloride  40 mEq Oral Daily   verapamil  180 mg Oral QPM   Continuous Infusions:  Time  46  [name], MD  [facility]

## 2023-10-30 NOTE — Progress Notes (Signed)
Mobility Specialist - Progress Note  (RA) Pre-mobility: 70 bpm HR, 95% SpO2 During mobility: 106 bpm HR, 93% SpO2 Post-mobility: 96 bpm HR, 95% SPO2   10/30/23 1512  Mobility  Activity Ambulated with assistance in hallway  Level of Assistance Standby assist, set-up cues, supervision of patient - no hands on  Assistive Device [employer] Ambulated (ft) 250 ft  Range of Motion/Exercises Active  Activity Response Tolerated well  Mobility Referral Yes  Mobility visit 1 Mobility  Mobility Specialist Start Time (ACUTE ONLY) 1500  Mobility Specialist Stop Time (ACUTE ONLY) 1512  Mobility Specialist Time Calculation (min) (ACUTE ONLY) 12 min   Pt was found in bed and agreeable to ambulate. No complaints with session. At EOS returned to sit EOB with all needs met. Call bell in reach and NT notified.  Billey Chang Mobility Specialist

## 2023-10-30 NOTE — Consult Note (Signed)
NAME:  Eric Lambert, MRN:  [id number], DOB:  1929/06/09, LOS: 1 ADMISSION DATE:  10/28/2023, CONSULTATION DATE:  12/6 REFERRING MD:  Mahala Menghini, CHIEF COMPLAINT:  recurrent R effusion   History of Present Illness:  89 yo M PMH HFpEF, HTN, MVR, Afib on eliquis, CKD 2-3,tachybrady / CHB s/p PPM, who was admitted to [facility] 10/28/23 with CC SOB + cough, found to have bilateral pleural effusions. Notably, was admitted to [facility] 11/11-11/14/24 with R>L pleural effusions, which was felt related to his HF + renal disease. While this apparently responded well to diuresis at that time, has not continued to be effective prompting ED eval 12/4 at recommendation of his PCP. He endorses that this came on suddenly and was somewhat episodic, but cannot relate what were the exacerbating or relieving factors. Only a dry cough recently, no wheezing. He reports compliance with lasix but gave different answers during the interview whether this was a long-term medication or not. He does not think he has previously had ankle edema or abdominal distention when he has had pleural effusions that have been diuresed in the past. No history of renal or liver disease. No history of tobacco use. 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    In ED, R>L effusions. BNP 243. Given lasix, admitted to [facility]. Underwent IR thora of R effusion 12/5 with 1.5L off. 12/6 was able to be weaned to RA.  Pleural fluid categorically transudative.  Cards consulted, suggested considering alt etiologies of effusions given non-contributory MVR, and some exam findings that would argue against this being HF driven.   PCCM is consulted in this setting.  On interview he admits to memory decline, rendering some of history possibly inaccurate.   One thora, 1.5L   Pertinent  Medical History  HFpEF HTN MVR Afib Chronic anticoagulation CKD Tachybrady syndrome/ CHB, sp PPM  Esophageal dysmotility (mild to moderate)   Significant  Hospital Events: Including procedures, antibiotic start and stop dates in addition to other pertinent events   12/4 admitted to [facility] diuresed. R>L effusions  12/5 thora w IR for R effusion  12/6 transudative fluid. Cards consulted. PCCM consulted as etiology may not be cardiac   Interim History / Subjective:  Seen by cardiology earlier today. PCCM is consulted this evening   Objective   Blood pressure 109/75, pulse 76, temperature 98 F (36.7 C), temperature source Oral, resp. rate (!) 24, height 5\' 10"  (1.778 m), weight 64.2 kg, SpO2 96%.        Intake/Output Summary (Last 24 hours) at 10/30/2023 1745 Last data filed at 10/30/2023 1715 Gross per 24 hour  Intake 840 ml  Output 400 ml  Net 440 ml   Filed Weights   10/28/23 1723 10/30/23 0500  Weight: 66.6 kg 64.2 kg    Examination: General: elderly man lying in bed in NAD HENT: [place]/AT, eyes anicteric Neck: mild JVD with + hepatojugular reflex when about 30 degrees in bed Lungs: reduced L>R basilar breath sounds, no wheezing. No conversational dyspnea Cardiovascular: S1S2, RRR Abdomen: soft, NT Extremities: no ankle edema Neuro: Awake, alert, normal speech. Answering questions but questions his answers at times. Able to sit up mostly independently.  Derm: warm, dry, no rashes  BUN 35 Cr 1.04 T bili 1.4  Pleural fluid: Protein <1.5g Glucose 100 LD 61 WBC 340; 89% lymphs Culture: pending  Cyto: pending  CXR personally reviewed: now L>R effusion CT personally reviewed> partially loculated R lateral effusion. No emphysema. Consolidation vs compressive atelectasis  in RLL. Linear scar in RML.  No emphysema. No calcification of pleura. No significant mediastinal or hilar adenopathy.    Resolved Hospital Problem list     Assessment & Plan:   Recurrent R>L pleural effusion, transudative > suggests either cardiac, kidney, or hepatic process in etiology.  -Known HFpEF and has a hx MVR regurg is not felt to be very  contributory by Cardiology team. Modestly elevated BNP, has not had classic decomp HF s/sx like extremity edema, but that would be more specific to RV failure. He has not had edema or distention in the past with effusions that have diuresed off. -- Pulmonology has been asked if there is alternative etiology than HF for his recurrent effusions.  -has hx esophageal dysmotility, so possible that there is aspiration leading to parapneumonic effusion, but fluid is more c/w transudative process  - T bili is up, query hepatic process.    Plan: RUQ [place] to evaluate liver Repeat CT with evacuation of loculated effusion and underlying compressed R lung Follow cytology & pleural cultures Mobilize, pulm hygiene, IS  Diuresis as per cards   Best Practice (right click and "Reselect all SmartList Selections" daily)   Per primary   Labs   CBC: Recent Labs  Lab 10/28/23 1415 10/29/23 0429 10/30/23 0403  WBC 7.4 6.9 7.1  NEUTROABS 5.2  --  5.0  HGB 14.8 13.3 13.3  HCT 45.5 40.6 41.5  MCV 91.9 92.3 93.9  PLT 128* 125* 127*    Basic Metabolic Panel: Recent Labs  Lab 10/28/23 1415 10/29/23 0429 10/30/23 0403  NA 141 141 137  K 3.7 3.0* 3.3*  CL 103 106 103  CO2 25 27 25   GLUCOSE 92 89 108*  BUN 36* 32* 35*  CREATININE 1.05 0.97 1.04  CALCIUM 8.9 8.3* 8.2*  MG  --  2.1  --    GFR: Estimated Creatinine Clearance: 41.2 mL/min (by C-G formula based on SCr of 1.04 mg/dL). Recent Labs  Lab 10/28/23 1415 10/29/23 0429 10/30/23 0403  WBC 7.4 6.9 7.1    Liver Function Tests: Recent Labs  Lab 10/28/23 1415 10/29/23 1356 10/30/23 0403  AST 28  --  17  ALT 28  --  19  ALKPHOS 79  --  68  BILITOT 1.4*  --  1.4*  PROT 6.9  --  5.5*  ALBUMIN 3.8 3.2* 2.9*   No results for input(s): "LIPASE", "AMYLASE" in the last 168 hours. No results for input(s): "AMMONIA" in the last 168 hours.  ABG No results found for: "PHART", "PCO2ART", "PO2ART", "HCO3", "TCO2", "ACIDBASEDEF", "O2SAT"    Coagulation Profile: No results for input(s): "INR", "PROTIME" in the last 168 hours.  Cardiac Enzymes: No results for input(s): "CKTOTAL", "CKMB", "CKMBINDEX", "TROPONINI" in the last 168 hours.  HbA1C: Hgb A1c MFr Bld  Date/Time Value Ref Range Status  11/07/2022 01:44 PM 5.6 4.6 - 6.5 % Final    Comment:    Glycemic Control Guidelines for People with Diabetes:Non Diabetic:  <6%Goal of Therapy: <7%Additional Action Suggested:  >8%   05/09/2022 01:47 PM 5.6 4.6 - 6.5 % Final    Comment:    Glycemic Control Guidelines for People with Diabetes:Non Diabetic:  <6%Goal of Therapy: <7%Additional Action Suggested:  >8%     CBG: No results for input(s): "GLUCAP" in the last 168 hours.  Review of Systems:   Review of Systems  Constitutional:  Negative for fever.  Respiratory:  Positive for cough and shortness of breath. Negative for hemoptysis, sputum  production and wheezing.   Cardiovascular:  Negative for chest pain and leg swelling.  Gastrointestinal: Negative.   Musculoskeletal:  Negative for back pain.  Neurological:  Negative for focal weakness.     Past Medical History:  He,  has a past medical history of Allergy, Arthritis, Atrial fibrillation -permanent, BPH (benign prostatic hyperplasia), Cancer (HCC), CHF (congestive heart failure) (HCC), Complete heart block (HCC), Dyspnea, ED (erectile dysfunction), GERD (gastroesophageal reflux disease), GLUCOSE INTOLERANCE (10/22/2007), Heart murmur, OSA (obstructive sleep apnea), Pacemaker BSX, Pneumonia, PONV (postoperative nausea and vomiting), Presence of permanent cardiac pacemaker, and SUBACUTE BACTERIAL ENDOCARDITIS (1970s).   Surgical History:   Past Surgical History:  Procedure Laterality Date   ESOPHAGOGASTRODUODENOSCOPY (EGD) WITH PROPOFOL N/A 01/07/2022   Procedure: ESOPHAGOGASTRODUODENOSCOPY (EGD) WITH PROPOFOL;  Surgeon: Beverley Fiedler, MD;  Location: [facility];  Service: Gastroenterology;  Laterality: N/A;    IMPACTION REMOVAL  01/07/2022   Procedure: IMPACTION REMOVAL;  Surgeon: Beverley Fiedler, MD;  Location: [facility];  Service: Gastroenterology;;   INGUINAL HERNIA REPAIR Bilateral 04/08/2021   Procedure: LAPAROSCOPIC BILATERAL INGUINAL HERNIA REPAIR WITH MESH;  Surgeon: Kinsinger, De Blanch, MD;  Location: [facility];  Service: General;  Laterality: Bilateral;   INSERT / REPLACE / REMOVE PACEMAKER     PACEMAKER GENERATOR CHANGE N/A 05/07/2012   Procedure: PACEMAKER GENERATOR CHANGE;  Surgeon: Duke Salvia, MD;  Location: [facility] CATH LAB;  Service: Cardiovascular;  Laterality: N/A;   PACEMAKER PLACEMENT     PARTIAL HIP ARTHROPLASTY     2008   PPM GENERATOR CHANGEOUT N/A 09/16/2023   Procedure: PPM GENERATOR CHANGEOUT;  Surgeon: Duke Salvia, MD;  Location: [facility] INVASIVE CV LAB;  Service: Cardiovascular;  Laterality: N/A;   TOTAL HIP ARTHROPLASTY Left 01/07/2022   Procedure: TOTAL HIP ARTHROPLASTY ANTERIOR APPROACH;  Surgeon: Sheral Apley, MD;  Location: [facility];  Service: Orthopedics;  Laterality: Left;     Social History:   reports that he has never smoked. He has never used smokeless tobacco. He reports that he does not currently use alcohol. He reports that he does not use drugs.   Family History:  His family history includes Brain cancer in his brother; Diabetes in his brother; Heart disease in an other family member; Prostate cancer in his brother and father. There is no history of Colon cancer, Pancreatic cancer, or Stomach cancer.   Allergies No Known Allergies   Home Medications  Prior to Admission medications   Medication Sig Start Date End Date Taking? Authorizing Provider  apixaban (ELIQUIS) 5 MG TABS tablet Take 1 tablet by mouth twice daily 10/26/23  Yes Duke Salvia, MD  Cholecalciferol (VITAMIN D-3 PO) Take 1 tablet by mouth daily.   Yes [provider]  Cyanocobalamin (VITAMIN B-12 PO) Take 2 tablets by mouth daily.   Yes [provider]  escitalopram  (LEXAPRO) 5 MG tablet Take 2 tablets (10 mg total) by mouth daily. Please take 5 mg of your Lexapro daily for the next 2 weeks, afterwards you may increase to 2 pills a day or 10 mg a day. 10/19/23  Yes Brenton Grills, MD  famotidine (PEPCID) 40 MG tablet Take 1 tablet (40 mg total) by mouth at bedtime. 12/09/22  Yes Unk Lightning, PA  furosemide (LASIX) 40 MG tablet Take 1 tablet (40 mg total) by mouth daily. Patient taking differently: Take 20 mg by mouth 2 (two) times daily. Take 0.5 a tablet at breakfast and take 0.5 a tablet at noon 10/13/23 11/12/23 Yes  [name], MD  Multiple Vitamins-Minerals [facility] EYE HEALTH FORMULA PO) Take 1 tablet by mouth in the morning and at bedtime.   Yes [provider]  omeprazole (PRILOSEC) 40 MG capsule TAKE 1 CAPSULE BY MOUTH TWICE DAILY 30-60 MINUTES BEFORE MEALS (BREAKFAST AND DINNER) Patient taking differently: Take 40 mg by mouth in the morning. 10/12/23  Yes Unk Lightning, PA  traZODone (DESYREL) 50 MG tablet Take 0.5-1 tablets (25-50 mg total) by mouth at bedtime as needed for sleep. 10/20/23  Yes [name], MD  verapamil (CALAN-SR) 240 MG CR tablet Take 1 tablet by mouth once daily Patient taking differently: Take 240 mg by mouth every evening. 05/06/23  Yes Duke Salvia, MD     Critical care time:       Steffanie Dunn, DO 10/30/23 6:30 PM  Pulmonary & Critical Care  For contact information, see Amion. If no response to pager, please call PCCM consult pager. After hours, 7PM- 7AM, please call Elink.

## 2023-10-30 NOTE — Plan of Care (Addendum)
Patient alert and inquisitive this morning. He asked questions about the source of the pleural fluid and how to manage this so it doesn't recur. Provider spent time at bedside discussing, please see doctor's note. Fluid restriction added to diet with caution against azotemia. Weaned to room air, but will try resting and ambulatory checks later to see how he does. Using IS, low volume but improved later in the day with practice. Patient does appear to be more confused in the evening and overnight, but is easily redirected and does his best to follow instructions. He remains a fall risk.No complaints of pain. Spouse at bedside.   Problem: Education: Goal: Knowledge of General Education information will improve Description: Including pain rating scale, medication(s)/side effects and non-pharmacologic comfort measures Outcome: Progressing   Problem: Health Behavior/Discharge Planning: Goal: Ability to manage health-related needs will improve Outcome: Progressing   Problem: Clinical Measurements: Goal: Ability to maintain clinical measurements within normal limits will improve Outcome: Progressing Goal: Will remain free from infection Outcome: Progressing Goal: Diagnostic test results will improve Outcome: Progressing Goal: Respiratory complications will improve Outcome: Progressing Goal: Cardiovascular complication will be avoided Outcome: Progressing   Problem: Activity: Goal: Risk for activity intolerance will decrease Outcome: Progressing   Problem: Nutrition: Goal: Adequate nutrition will be maintained Outcome: Progressing   Problem: Coping: Goal: Level of anxiety will decrease Outcome: Progressing   Problem: Elimination: Goal: Will not experience complications related to bowel motility Outcome: Progressing Goal: Will not experience complications related to urinary retention Outcome: Progressing   Problem: Pain Management: Goal: General experience of comfort will  improve Outcome: Progressing   Problem: Safety: Goal: Ability to remain free from injury will improve Outcome: Progressing   Problem: Skin Integrity: Goal: Risk for impaired skin integrity will decrease Outcome: Progressing

## 2023-10-31 ENCOUNTER — Inpatient Hospital Stay (HOSPITAL_COMMUNITY): Payer: Medicare HMO

## 2023-10-31 DIAGNOSIS — J90 Pleural effusion, not elsewhere classified: Secondary | ICD-10-CM | POA: Diagnosis not present

## 2023-10-31 LAB — COMPREHENSIVE METABOLIC PANEL
ALT: 19 U/L (ref 0–44)
AST: 16 U/L (ref 15–41)
Albumin: 3 g/dL — ABNORMAL LOW (ref 3.5–5.0)
Alkaline Phosphatase: 63 U/L (ref 38–126)
Anion gap: 8 (ref 5–15)
BUN: 37 mg/dL — ABNORMAL HIGH (ref 8–23)
CO2: 22 mmol/L (ref 22–32)
Calcium: 8.3 mg/dL — ABNORMAL LOW (ref 8.9–10.3)
Chloride: 105 mmol/L (ref 98–111)
Creatinine, Ser: 0.98 mg/dL (ref 0.61–1.24)
GFR, Estimated: >60 mL/min (ref >60)
Glucose, Bld: 100 mg/dL — ABNORMAL HIGH (ref 70–99)
Potassium: 4 mmol/L (ref 3.5–5.1)
Sodium: 135 mmol/L (ref 135–145)
Total Bilirubin: 1.2 mg/dL — ABNORMAL HIGH (ref <1.2)
Total Protein: 5.6 g/dL — ABNORMAL LOW (ref 6.5–8.1)

## 2023-10-31 LAB — CBC WITH DIFFERENTIAL/PLATELET
Abs Immature Granulocytes: 0.03 10*3/uL (ref 0.00–0.07)
Basophils Absolute: 0 10*3/uL (ref 0.0–0.1)
Basophils Relative: 0 %
Eosinophils Absolute: 0.2 10*3/uL (ref 0.0–0.5)
Eosinophils Relative: 3 %
HCT: 40.6 % (ref 39.0–52.0)
Hemoglobin: 13.5 g/dL (ref 13.0–17.0)
Immature Granulocytes: 0 %
Lymphocytes Relative: 22 %
Lymphs Abs: 1.5 10*3/uL (ref 0.7–4.0)
MCH: 30.8 pg (ref 26.0–34.0)
MCHC: 33.3 g/dL (ref 30.0–36.0)
MCV: 92.5 fL (ref 80.0–100.0)
Monocytes Absolute: 0.8 10*3/uL (ref 0.1–1.0)
Monocytes Relative: 12 %
Neutro Abs: 4.3 10*3/uL (ref 1.7–7.7)
Neutrophils Relative %: 63 %
Platelets: 124 10*3/uL — ABNORMAL LOW (ref 150–400)
RBC: 4.39 MIL/uL (ref 4.22–5.81)
RDW: 15.5 % (ref 11.5–15.5)
WBC: 6.8 10*3/uL (ref 4.0–10.5)
nRBC: 0 % (ref 0.0–0.2)

## 2023-10-31 LAB — MAGNESIUM: Magnesium: 2.1 mg/dL (ref 1.7–2.4)

## 2023-10-31 MED ORDER — AMOXICILLIN-POT CLAVULANATE 875-125 MG PO TABS
1.0000 | ORAL_TABLET | Freq: Two times a day (BID) | ORAL | Status: DC
  Administered 2023-10-31 – 2023-11-03 (×6): 1 via ORAL
  Filled 2023-10-31 (×6): qty 1

## 2023-10-31 NOTE — Consult Note (Addendum)
NAME:  Eric Lambert, MRN:  [id number], DOB:  [birth year], LOS: 2 ADMISSION DATE:  10/28/2023, CONSULTATION DATE:  12/6 REFERRING MD:  Mahala Menghini, CHIEF COMPLAINT:  recurrent R effusion   BRIEF  89 yo M PMH HFpEF, HTN, MVR, Afib on eliquis, CKD 2-3,tachybrady / CHB s/p PPM, who was admitted to [facility] 10/28/23 with CC SOB + cough, found to have bilateral pleural effusions. Notably, was admitted to [facility] 11/11-11/14/24 with R>L pleural effusions, which was felt related to his HF + renal disease. While this apparently responded well to diuresis at that time, has not continued to be effective prompting ED eval 12/4 at recommendation of his PCP. He endorses that this came on suddenly and was somewhat episodic, but cannot relate what were the exacerbating or relieving factors. Only a dry cough recently, no wheezing. He reports compliance with lasix but gave different answers during the interview whether this was a long-term medication or not. He does not think he has previously had ankle edema or abdominal distention when he has had pleural effusions that have been diuresed in the past. No history of renal or liver disease. No history of tobacco use. 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    In ED, R>L effusions. BNP 243. Given lasix, admitted to [facility]. Underwent IR thora of R effusion 12/5 with 1.5L off. 12/6 was able to be weaned to RA.  Pleural fluid categorically transudative.  Cards consulted, suggested considering alt etiologies of effusions given non-contributory MVR, and some exam findings that would argue against this being HF driven.   PCCM is consulted in this setting.  On interview he admits to memory decline, rendering some of history possibly inaccurate.   One thora, 1.5L   Pertinent  Medical History  HFpEF HTN MVR Afib Chronic anticoagulation CKD Tachybrady syndrome/ CHB, sp PPM  Esophageal dysmotility (mild to moderate)   Significant Hospital Events: Including  procedures, antibiotic start and stop dates in addition to other pertinent events   eCHO 11/12 Elevated PASP 30s and moderate Mitral Regurg 12/4 admitted to [facility] diuresed. R>L effusions  -  BNP 243 12/5 thora w IR for R effusion  12/6 transudative fluid. Cards consulted. PCCM consulted as etiology may not be cardiac   Interim History / Subjective:   12/7 - denies complaints but is puzzled about his effusion. Cytology pending. 80% lymphocytes . Chemistry panel is c/w Transudate. Chart review shows No effusions in 2022 ut present since Nov 2024. Post thora CT chest - no mass seen. RUQ [place] - fatty liver only   Objective   Blood pressure (!) 113/57, pulse 83, temperature 97.8 F (36.6 C), temperature source Oral, resp. rate 20, height 5\' 10"  (1.778 m), weight 64.9 kg, SpO2 97%.        Intake/Output Summary (Last 24 hours) at 10/31/2023 1731 Last data filed at 10/31/2023 0900 Gross per 24 hour  Intake 290 ml  Output 200 ml  Net 90 ml   Filed Weights   10/28/23 1723 10/30/23 0500 10/31/23 0500  Weight: 66.6 kg 64.2 kg 64.9 kg    Examination: General Appearance:  Looks stable. STrong for age Head:  Normocephalic, without obvious abnormality, atraumatic Eyes:  PERRL - yes, conjunctiva/corneas - mudd     Ears:  Normal external ear canals, both ears Nose:  G tube - no Throat:  ETT TUBE - no , OG tube - no Neck:  Supple,  No enlargement/tenderness/nodules Lungs: Clear to auscultation bilaterally,  Heart:  S1  and S2 normal, no murmur, CVP - no.  Pressors - no Abdomen:  Soft, no masses, no organomegaly Genitalia / Rectal:  Not done Extremities:  Extremities- intact Skin:  ntact in exposed areas . Sacral area - not examined Neurologic:  Sedation - none -> RASS - +1 . Moves all 4s - yes. CAM-ICU - neg . Orientation - x3+     Resolved Hospital Problem list     Assessment & Plan:   Recurrent R>L pleural effusion, transudative > suggests either cardiac, kidney, or hepatic process in  etiology.  -Known HFpEF and has a hx MVR regurg is not felt to be very contributory by Cardiology team. Modestly elevated BNP, has not had classic decomp HF s/sx like extremity edema, but that would be more specific to RV failure.   12/7 /24: No etiology outside of cardiac identified. RUQ [place] without cirrhosis . Need to know if there is proteinuria   Plan: Start 24h urine protein collectio - if negative, then consider Right Heart Cath VERSUS conitnue supportive care and empitic medical management    Ccm wil round gain 11/02/23     SIGNATURE    Dr. Kalman Shan, M.D., F.C.C.P,  Pulmonary and Critical Care Medicine Staff Physician, [facility] Director - Interstitial Lung Disease  Program  Pulmonary Fibrosis [facility] at [facility] [place], [place], [address]   Pager: [phone], If no answer  -> Check AMION or Try [phone] Telephone (clinical office): [phone] Telephone (research): [phone]  5:31 PM 10/31/2023     LABS    PULMONARY No results for input(s): "PHART", "PCO2ART", "PO2ART", "HCO3", "TCO2", "O2SAT" in the last 168 hours.  Invalid input(s): "PCO2", "PO2"  CBC Recent Labs  Lab 10/29/23 0429 10/30/23 0403 10/31/23 0408  HGB 13.3 13.3 13.5  HCT 40.6 41.5 40.6  WBC 6.9 7.1 6.8  PLT 125* 127* 124*    COAGULATION No results for input(s): "INR" in the last 168 hours.  CARDIAC  No results for input(s): "TROPONINI" in the last 168 hours. No results for input(s): "PROBNP" in the last 168 hours.   CHEMISTRY Recent Labs  Lab 10/28/23 1415 10/29/23 0429 10/30/23 0403 10/31/23 0408  NA 141 141 137 135  K 3.7 3.0* 3.3* 4.0  CL 103 106 103 105  CO2 25 27 25 22   GLUCOSE 92 89 108* 100*  BUN 36* 32* 35* 37*  CREATININE 1.05 0.97 1.04 0.98  CALCIUM 8.9 8.3* 8.2* 8.3*  MG  --  2.1  --  2.1   Estimated Creatinine Clearance: 44.1 mL/min (by C-G formula based on SCr of 0.98  mg/dL).   LIVER Recent Labs  Lab 10/28/23 1415 10/29/23 1356 10/30/23 0403 10/31/23 0408  AST 28  --  17 16  ALT 28  --  19 19  ALKPHOS 79  --  68 63  BILITOT 1.4*  --  1.4* 1.2*  PROT 6.9  --  5.5* 5.6*  ALBUMIN 3.8 3.2* 2.9* 3.0*     INFECTIOUS No results for input(s): "LATICACIDVEN", "PROCALCITON" in the last 168 hours.   ENDOCRINE CBG (last 3)  No results for input(s): "GLUCAP" in the last 72 hours.       IMAGING x48h  - image(s) personally visualized  -   highlighted in bold CT CHEST WO CONTRAST  Result Date: 10/31/2023 CLINICAL DATA:  Loculated pleural effusion EXAM: CT CHEST WITHOUT CONTRAST TECHNIQUE: Multidetector CT imaging of the chest was performed  following the standard protocol without IV contrast. RADIATION DOSE REDUCTION: This exam was performed according to the departmental dose-optimization program which includes automated exposure control, adjustment of the mA and/or kV according to patient size and/or use of iterative reconstruction technique. COMPARISON:  10/05/2023 FINDINGS: Cardiovascular: Right subclavian transvenous pacemaker with leads to the right atrium and RV apex. Borderline cardiomegaly with lead to left atrial enlargement. No pericardial effusion. Scattered coronary calcifications. Calcified plaque in the aortic arch and descending thoracic segment. Mediastinum/Nodes: No mass or adenopathy. Lungs/Pleura: Small pleural effusions right greater than left, slightly increased on the left since prior study. No pneumothorax. New geographic ground-glass and airspace opacities peripherally in both upper lobes. Progressive airspace consolidation in the dependent aspect of the left lower lobe. Continued airspace opacities posteriorly in the right lower lobe with slightly improved aeration. Subcentimeter calcified granulomas in the left lower lobe. Upper Abdomen: No acute findings. Musculoskeletal: Vertebral endplate spurring at multiple levels in the mid and  lower thoracic spine. IMPRESSION: 1. Small pleural effusions right greater than left, slightly increased on the left since prior study. 2. Progressive airspace consolidation in the dependent aspect of the left lower lobe. 3. New geographic ground-glass and airspace opacities peripherally in both upper lobes. 4. Coronary and aortic Atherosclerosis (ICD10-I70.0). Electronically Signed   By: Corlis Leak M.D.   On: 10/31/2023 10:59   US Abdomen Limited RUQ (LIVER/GB)  Result Date: 10/30/2023 CLINICAL DATA:  Hyperbilirubinemia EXAM: ULTRASOUND ABDOMEN LIMITED RIGHT UPPER QUADRANT COMPARISON:  None Available. FINDINGS: Gallbladder: No gallstones or wall thickening visualized. No sonographic Murphy sign noted by sonographer. Common bile duct: Diameter: 2 mm Liver: Hyperechoic hepatic parenchyma, suggesting hepatic steatosis. No focal hepatic lesion is seen. Portal vein is patent on color Doppler imaging with normal direction of blood flow towards the liver. Other: None. IMPRESSION: Hepatic steatosis. Otherwise negative right upper quadrant ultrasound. Electronically Signed   By: Charline Bills M.D.   On: 10/30/2023 23:08   DG CHEST PORT 1 VIEW  Result Date: 10/30/2023 CLINICAL DATA:  89 year old male status post right side thoracentesis yesterday. EXAM: PORTABLE CHEST 1 VIEW COMPARISON:  Post thoracentesis portable chest 10/29/2023 and earlier. FINDINGS: Portable AP view at 0802 hours. Stable large lung volumes. Stable cardiomegaly and left chest pacemaker. Bilateral veiling lung base opacity, remains decreased on the right compared to 10/28/2023. Increased pulmonary interstitial opacity, vascularity diffusely. No pneumothorax. No air bronchograms. Stable visualized osseous structures. IMPRESSION: Increased interstitial opacity since yesterday favored to be acute interstitial edema. Residual bilateral pleural effusions, regressed on the right following thoracentesis yesterday. No pneumothorax. Electronically  Signed   By: Odessa Fleming M.D.   On: 10/30/2023 11:16

## 2023-10-31 NOTE — Progress Notes (Signed)
HOSPITALIST ROUNDING NOTE Eric [facility] QIO:[id number]  DOB: 1929/02/20  DOA: 10/28/2023  PCP: Eric Majestic, MD  10/31/2023,3:35 PM   LOS: 2 days      Code Status: Full From: Home  current Dispo: Unclear     89 year old white male Known history of A-fib complicated by heart block heart failure-s/p Social worker. Klein-this was in the setting of bacterial endocarditis in 1970 Previous left hip osteoarthritis status post left hip arthroplasty Possible dysphagia stricture status postdilatation by GI  Recent hospitalization 11/11-11/14/2024 DOE, PND, bilateral pleural effusions CT confirmed more right side greater than left side patient was placed on diuretics EF was 60-65% with moderate mitral valve regurg diuresed 1.8 L--hospitalization complicated by mild renal insufficiency  Developed worsening DOE nonproductive cough despite continue diuretics came to ED 12/5 IR consulted-1.5 L thoracentesis performed 12/6 cardiology consulted-felt not heart failure related--- pulmonology consulted workup performed US RUQ = hepatic steatosis 12/7 CT chest small effusions right greater than sign left progressive consolidation dependent left lung airspace opacities peripherally both upper lobes  Plan  Recurrent pleural effusions right sided--initially felt to be heart failure --Lymphocytic feel this is inflammatory Thoracentesis predominantly lymphocytic--- await pathology and further discussion with patient/family Continue for now Lasix 20 twice daily Empiric Augmentin given CT changes but do not feel this is infectious and will give 3-day supply Repeat x-ray in morning although not requiring oxygen Will require pathology and once he reaccumulates may require further drains/Pleurx as I do not think this is from heart failure-I will discuss implications with family-I have discussed the case with Dr. Marchelle Lambert who is aware and will see  Atrial fibrillation heart block PPM HFpEF  60-65% Continue Eliquis 5 twice daily for now Continue lower dose of verapamil 180 CD (goal dose to 40)  Hypokalemia Now resolved-stop replacement check labs periodically  AK on admit superimposed on CKD 2 Creatinine appears relatively stable Resolved   depression Continue Lexapro will 10, trazodone  Discussed with wife at bedside  DVT prophylaxis: Eliquis  Status is: Observation The patient will require care spanning > 2 midnights and should be moved to inpatient because:   Further workup-pulmonology seen    Subjective: Overall better ambulating some Many questions about diagnosis-I have told him it is inflammatory but we do not have any significant further information at this time  Objective + exam Vitals:   10/31/23 0900 10/31/23 1000 10/31/23 1100 10/31/23 1248  BP:    (!) 113/57  Pulse:    83  Resp: (!) 28 (!) 22 18 16   Temp:    97.8 F (36.6 C)  TempSrc:    Oral  SpO2:    97%  Weight:      Height:       Filed Weights   10/28/23 1723 10/30/23 0500 10/31/23 0500  Weight: 66.6 kg 64.2 kg 64.9 kg    Examination:  Awake coherent Decreased air entry on right side compared to left No crackles S1-S2 no murmur Abdomen obese nontender no rebound no guarding ROM intact Power 5/5  Data Reviewed: reviewed   CBC    Component Value Date/Time   WBC 6.8 10/31/2023 0408   RBC 4.39 10/31/2023 0408   HGB 13.5 10/31/2023 0408   HGB 13.9 09/08/2023 1448   HCT 40.6 10/31/2023 0408   HCT 42.2 09/08/2023 1448   PLT 124 (L) 10/31/2023 0408   PLT 142 (L) 09/08/2023 1448   MCV 92.5 10/31/2023 0408   MCV 93 09/08/2023 1448   MCH  30.8 10/31/2023 0408   MCHC 33.3 10/31/2023 0408   RDW 15.5 10/31/2023 0408   RDW 12.7 09/08/2023 1448   LYMPHSABS 1.5 10/31/2023 0408   LYMPHSABS 1.6 01/13/2017 1533   MONOABS 0.8 10/31/2023 0408   EOSABS 0.2 10/31/2023 0408   EOSABS 0.1 01/13/2017 1533   BASOSABS 0.0 10/31/2023 0408   BASOSABS 0.0 01/13/2017 1533      Latest Ref  Rng & Units 10/31/2023    4:08 AM 10/30/2023    4:03 AM 10/29/2023    4:29 AM  CMP  Glucose 70 - 99 mg/dL 161  096  89   BUN 8 - 23 mg/dL 37  35  32   Creatinine 0.61 - 1.24 mg/dL 0.45  4.09  8.11   Sodium 135 - 145 mmol/L 135  137  141   Potassium 3.5 - 5.1 mmol/L 4.0  3.3  3.0   Chloride 98 - 111 mmol/L 105  103  106   CO2 22 - 32 mmol/L 22  25  27    Calcium 8.9 - 10.3 mg/dL 8.3  8.2  8.3   Total Protein 6.5 - 8.1 g/dL 5.6  5.5    Total Bilirubin <1.2 mg/dL 1.2  1.4    Alkaline Phos 38 - 126 U/L 63  68    AST 15 - 41 U/L 16  17    ALT 0 - 44 U/L 19  19      Scheduled Meds:  apixaban  5 mg Oral BID   escitalopram  10 mg Oral Daily   famotidine  40 mg Oral QHS   furosemide  20 mg Oral BID   potassium chloride  40 mEq Oral BID   verapamil  180 mg Oral QPM   Continuous Infusions:  Time  46  Eric Mura, MD  [facility]

## 2023-10-31 NOTE — Progress Notes (Signed)
Mobility Specialist - Progress Note  Pre-mobility: 62 bpm HR, 97% SpO2 During mobility: 117 bpm HR, 96% SpO2 Post-mobility: 110 bpm HR, 97% SPO2   10/31/23 1127  Mobility  Activity Ambulated with assistance in hallway  Level of Assistance Standby assist, set-up cues, supervision of patient - no hands on  Assistive Device [employer] Ambulated (ft) 250 ft  Range of Motion/Exercises Active  Activity Response Tolerated fair  Mobility Referral Yes  Mobility visit 1 Mobility  Mobility Specialist Start Time (ACUTE ONLY) 1113  Mobility Specialist Stop Time (ACUTE ONLY) 1127  Mobility Specialist Time Calculation (min) (ACUTE ONLY) 14 min   Pt was found in bed and agreeable to ambulate. Stated feeling "rocky" during session. Had some coughing. At EOS returned to bed with all needs met. Call bell in reach and bed alarm on.  Billey Chang Mobility Specialist

## 2023-11-01 ENCOUNTER — Inpatient Hospital Stay (HOSPITAL_COMMUNITY): Payer: Medicare HMO

## 2023-11-01 DIAGNOSIS — J90 Pleural effusion, not elsewhere classified: Secondary | ICD-10-CM | POA: Diagnosis not present

## 2023-11-01 LAB — COMPREHENSIVE METABOLIC PANEL
ALT: 19 U/L (ref 0–44)
AST: 15 U/L (ref 15–41)
Albumin: 3.1 g/dL — ABNORMAL LOW (ref 3.5–5.0)
Alkaline Phosphatase: 67 U/L (ref 38–126)
Anion gap: 10 (ref 5–15)
BUN: 37 mg/dL — ABNORMAL HIGH (ref 8–23)
CO2: 22 mmol/L (ref 22–32)
Calcium: 8.7 mg/dL — ABNORMAL LOW (ref 8.9–10.3)
Chloride: 103 mmol/L (ref 98–111)
Creatinine, Ser: 1.06 mg/dL (ref 0.61–1.24)
GFR, Estimated: >60 mL/min (ref >60)
Glucose, Bld: 120 mg/dL — ABNORMAL HIGH (ref 70–99)
Potassium: 4.5 mmol/L (ref 3.5–5.1)
Sodium: 135 mmol/L (ref 135–145)
Total Bilirubin: 0.9 mg/dL (ref <1.2)
Total Protein: 6.1 g/dL — ABNORMAL LOW (ref 6.5–8.1)

## 2023-11-01 LAB — CBC WITH DIFFERENTIAL/PLATELET
Abs Immature Granulocytes: 0.01 10*3/uL (ref 0.00–0.07)
Basophils Absolute: 0 10*3/uL (ref 0.0–0.1)
Basophils Relative: 0 %
Eosinophils Absolute: 0.1 10*3/uL (ref 0.0–0.5)
Eosinophils Relative: 2 %
HCT: 41.6 % (ref 39.0–52.0)
Hemoglobin: 13.7 g/dL (ref 13.0–17.0)
Immature Granulocytes: 0 %
Lymphocytes Relative: 16 %
Lymphs Abs: 1.2 10*3/uL (ref 0.7–4.0)
MCH: 30.6 pg (ref 26.0–34.0)
MCHC: 32.9 g/dL (ref 30.0–36.0)
MCV: 92.9 fL (ref 80.0–100.0)
Monocytes Absolute: 0.8 10*3/uL (ref 0.1–1.0)
Monocytes Relative: 10 %
Neutro Abs: 5.2 10*3/uL (ref 1.7–7.7)
Neutrophils Relative %: 72 %
Platelets: 124 10*3/uL — ABNORMAL LOW (ref 150–400)
RBC: 4.48 MIL/uL (ref 4.22–5.81)
RDW: 15.4 % (ref 11.5–15.5)
WBC: 7.3 10*3/uL (ref 4.0–10.5)
nRBC: 0 % (ref 0.0–0.2)

## 2023-11-01 LAB — BODY FLUID CULTURE W GRAM STAIN: Culture: NO GROWTH

## 2023-11-01 NOTE — Progress Notes (Signed)
HOSPITALIST ROUNDING NOTE Eric Lambert [facility] YNW:[id number]  DOB: 08/18/1929  DOA: 10/28/2023  PCP: [name], MD  11/01/2023,3:42 PM   LOS: 3 days      Code Status: Full From: Home  current Dispo: Unclear     89 year old white male Known history of A-fib complicated by heart block heart failure-s/p Social worker. Klein-this was in the setting of bacterial endocarditis in 1970 Previous left hip osteoarthritis status post left hip arthroplasty Possible dysphagia stricture status postdilatation by GI  Recent hospitalization 11/11-11/14/2024 DOE, PND, bilateral pleural effusions CT confirmed more right side greater than left side patient was placed on diuretics EF was 60-65% with moderate mitral valve regurg diuresed 1.8 L--hospitalization complicated by mild renal insufficiency  Developed worsening DOE nonproductive cough despite continue diuretics came to ED 12/5 IR consulted-1.5 L thoracentesis performed 12/6 cardiology consulted-felt not heart failure related--- pulmonology consulted workup performed US RUQ = hepatic steatosis 12/7 CT chest small effusions right greater than sign left progressive consolidation dependent left lung airspace opacities peripherally both upper lobes 12/8 cxr--loculation of fluid on R side  Plan  Recurrent pleural effusions right sided--initially felt to be heart failure --Lymphocytic feel this is inflammatory Thoracentesis predominantly lymphocytic--- await pathology --pulmonary aware--getting 24 hr protein Continue for now Lasix 20 twice daily Empiric Augmentin x 3 days --EOT 12/10 Await further pulmonary input---? CT guided drain vs pleurx--follow with daily CXR  Atrial fibrillation heart block PPM HFpEF 60-65% Continue Eliquis 5 twice daily for now--if procedures, switch to lovenox Continue lower dose of verapamil 180 CD (home dose 240)  Hypokalemia Now resolved-stop replacement check labs periodically  AK on admit  superimposed on CKD 2 Creatinine appears relatively stable Resolved   depression Continue Lexapro will 10, trazodone  Discussed with wife at bedside--explain unclear inciting and aggravating etiology---all q's answered  DVT prophylaxis: Eliquis  Status is: Observation The patient will require care spanning > 2 midnights and should be moved to inpatient because:   Further workup-pulmonology seeing    Subjective:  Ambulatory-no fever chills n/v/cp--some sputum  Objective + exam Vitals:   10/31/23 1500 10/31/23 2055 11/01/23 0513 11/01/23 1319  BP:  104/62 (!) 132/91 107/72  Pulse:  63 82 82  Resp: 20 16 20 14   Temp:  97.6 F (36.4 C) 97.7 F (36.5 C) 97.7 F (36.5 C)  TempSrc:  Oral Oral Oral  SpO2:  94% 94% 96%  Weight:   65.3 kg   Height:       Filed Weights   10/30/23 0500 10/31/23 0500 11/01/23 0513  Weight: 64.2 kg 64.9 kg 65.3 kg    Examination:  Awake coherent Decreased air entry on right side compared to left S1-S2 no murmur No jvd, trace LE edema Neuro is intact  Data Reviewed: reviewed   CBC    Component Value Date/Time   WBC 7.3 11/01/2023 0417   RBC 4.48 11/01/2023 0417   HGB 13.7 11/01/2023 0417   HGB 13.9 09/08/2023 1448   HCT 41.6 11/01/2023 0417   HCT 42.2 09/08/2023 1448   PLT 124 (L) 11/01/2023 0417   PLT 142 (L) 09/08/2023 1448   MCV 92.9 11/01/2023 0417   MCV 93 09/08/2023 1448   MCH 30.6 11/01/2023 0417   MCHC 32.9 11/01/2023 0417   RDW 15.4 11/01/2023 0417   RDW 12.7 09/08/2023 1448   LYMPHSABS 1.2 11/01/2023 0417   LYMPHSABS 1.6 01/13/2017 1533   MONOABS 0.8 11/01/2023 0417   EOSABS 0.1 11/01/2023 0417   EOSABS  0.1 01/13/2017 1533   BASOSABS 0.0 11/01/2023 0417   BASOSABS 0.0 01/13/2017 1533      Latest Ref Rng & Units 11/01/2023    4:17 AM 10/31/2023    4:08 AM 10/30/2023    4:03 AM  CMP  Glucose 70 - 99 mg/dL 742  595  638   BUN 8 - 23 mg/dL 37  37  35   Creatinine 0.61 - 1.24 mg/dL 7.56  4.33  2.95   Sodium 135  - 145 mmol/L 135  135  137   Potassium 3.5 - 5.1 mmol/L 4.5  4.0  3.3   Chloride 98 - 111 mmol/L 103  105  103   CO2 22 - 32 mmol/L 22  22  25    Calcium 8.9 - 10.3 mg/dL 8.7  8.3  8.2   Total Protein 6.5 - 8.1 g/dL 6.1  5.6  5.5   Total Bilirubin <1.2 mg/dL 0.9  1.2  1.4   Alkaline Phos 38 - 126 U/L 67  63  68   AST 15 - 41 U/L 15  16  17    ALT 0 - 44 U/L 19  19  19      Scheduled Meds:  amoxicillin-clavulanate  1 tablet Oral Q12H   apixaban  5 mg Oral BID   famotidine  40 mg Oral QHS   furosemide  20 mg Oral BID   potassium chloride  40 mEq Oral BID   verapamil  180 mg Oral QPM   Continuous Infusions:  Time  26  [name], MD  [facility]

## 2023-11-01 NOTE — Plan of Care (Signed)

## 2023-11-01 NOTE — Progress Notes (Signed)
Mobility Specialist - Progress Note  (RA) Pre-mobility: 71 bpm HR, 95% SpO2 During mobility: 127 bpm HR, 93% SpO2 Post-mobility: 97 bpm HR, 95% SPO2   11/01/23 0935  Mobility  Activity Ambulated with assistance in hallway  Level of Assistance Standby assist, set-up cues, supervision of patient - no hands on  Assistive Device Cane  Distance Ambulated (ft) 250 ft  Range of Motion/Exercises Active  Activity Response Tolerated fair  Mobility Referral Yes  Mobility visit 1 Mobility  Mobility Specialist Start Time (ACUTE ONLY) 0908  Mobility Specialist Stop Time (ACUTE ONLY) 0935  Mobility Specialist Time Calculation (min) (ACUTE ONLY) 27 min   Pt was found in bed and agreeable to ambulate. Stated wanting to do a "warm-up" prior to ambulation and completed x10 seated marches. During ambulation had a harder work of breathing compared to previous days and grew fatigued. At EOS was was left in bed with all needs met. Call bell in reach.  Billey Chang Mobility Specialist

## 2023-11-02 ENCOUNTER — Inpatient Hospital Stay (HOSPITAL_COMMUNITY): Payer: Medicare HMO

## 2023-11-02 DIAGNOSIS — I5033 Acute on chronic diastolic (congestive) heart failure: Secondary | ICD-10-CM | POA: Diagnosis not present

## 2023-11-02 DIAGNOSIS — I4821 Permanent atrial fibrillation: Secondary | ICD-10-CM | POA: Diagnosis not present

## 2023-11-02 DIAGNOSIS — I5031 Acute diastolic (congestive) heart failure: Secondary | ICD-10-CM

## 2023-11-02 DIAGNOSIS — J90 Pleural effusion, not elsewhere classified: Secondary | ICD-10-CM | POA: Diagnosis not present

## 2023-11-02 LAB — CBC WITH DIFFERENTIAL/PLATELET
Abs Immature Granulocytes: 0.02 10*3/uL (ref 0.00–0.07)
Basophils Absolute: 0 10*3/uL (ref 0.0–0.1)
Basophils Relative: 0 %
Eosinophils Absolute: 0.2 10*3/uL (ref 0.0–0.5)
Eosinophils Relative: 3 %
HCT: 41.5 % (ref 39.0–52.0)
Hemoglobin: 13.8 g/dL (ref 13.0–17.0)
Immature Granulocytes: 0 %
Lymphocytes Relative: 22 %
Lymphs Abs: 1.6 10*3/uL (ref 0.7–4.0)
MCH: 30.8 pg (ref 26.0–34.0)
MCHC: 33.3 g/dL (ref 30.0–36.0)
MCV: 92.6 fL (ref 80.0–100.0)
Monocytes Absolute: 0.8 10*3/uL (ref 0.1–1.0)
Monocytes Relative: 11 %
Neutro Abs: 4.5 10*3/uL (ref 1.7–7.7)
Neutrophils Relative %: 64 %
Platelets: 127 10*3/uL — ABNORMAL LOW (ref 150–400)
RBC: 4.48 MIL/uL (ref 4.22–5.81)
RDW: 15.2 % (ref 11.5–15.5)
WBC: 7.1 10*3/uL (ref 4.0–10.5)
nRBC: 0 % (ref 0.0–0.2)

## 2023-11-02 LAB — COMPREHENSIVE METABOLIC PANEL
ALT: 16 U/L (ref 0–44)
AST: 15 U/L (ref 15–41)
Albumin: 3.1 g/dL — ABNORMAL LOW (ref 3.5–5.0)
Alkaline Phosphatase: 66 U/L (ref 38–126)
Anion gap: 9 (ref 5–15)
BUN: 31 mg/dL — ABNORMAL HIGH (ref 8–23)
CO2: 21 mmol/L — ABNORMAL LOW (ref 22–32)
Calcium: 8.3 mg/dL — ABNORMAL LOW (ref 8.9–10.3)
Chloride: 102 mmol/L (ref 98–111)
Creatinine, Ser: 0.9 mg/dL (ref 0.61–1.24)
GFR, Estimated: >60 mL/min (ref >60)
Glucose, Bld: 95 mg/dL (ref 70–99)
Potassium: 4.2 mmol/L (ref 3.5–5.1)
Sodium: 132 mmol/L — ABNORMAL LOW (ref 135–145)
Total Bilirubin: 1.1 mg/dL (ref <1.2)
Total Protein: 6.1 g/dL — ABNORMAL LOW (ref 6.5–8.1)

## 2023-11-02 LAB — ECHOCARDIOGRAM LIMITED
Calc EF: 50.1 %
Height: 70 in
S' Lateral: 2.8 cm
Single Plane A2C EF: 49.3 %
Single Plane A4C EF: 51.2 %
Weight: 2342.17 [oz_av]

## 2023-11-02 LAB — PROTEIN, URINE, 24 HOUR
Collection Interval-UPROT: 24 h
Protein, 24H Urine: 202 mg/d — ABNORMAL HIGH (ref 50–100)
Protein, Urine: 13 mg/dL
Urine Total Volume-UPROT: 1550 mL

## 2023-11-02 LAB — CYTOLOGY - NON PAP

## 2023-11-02 MED ORDER — ORAL CARE MOUTH RINSE: 15.0000 mL | OROMUCOSAL | Status: DC | PRN

## 2023-11-02 MED ORDER — FUROSEMIDE 40 MG PO TABS
40.0000 mg | ORAL_TABLET | Freq: Two times a day (BID) | ORAL | Status: DC
  Administered 2023-11-02 – 2023-11-03 (×2): 40 mg via ORAL
  Filled 2023-11-02 (×2): qty 1

## 2023-11-02 MED ORDER — POTASSIUM CHLORIDE CRYS ER 20 MEQ PO TBCR
40.0000 meq | EXTENDED_RELEASE_TABLET | Freq: Every day | ORAL | Status: DC
  Administered 2023-11-03: 40 meq via ORAL
  Filled 2023-11-02: qty 2

## 2023-11-02 NOTE — Progress Notes (Signed)
  Echocardiogram 2D Echocardiogram has been performed.  Eric Lambert 11/02/2023, 1:19 PM

## 2023-11-02 NOTE — Progress Notes (Signed)
There was consult for placing a PIV access. Talked to patient's RN why patient need it for. Concerning about patient's heart condition and MD wanted to have it. Explained patient why he needs it, but patient refused to place it at this time. Informed patient's RN regarding this matter. HS [employer]

## 2023-11-02 NOTE — Progress Notes (Signed)
NAME:  Eric Lambert, MRN:  [id number], DOB:  August 03, 1929, LOS: 4 ADMISSION DATE:  10/28/2023, CONSULTATION DATE: 10/30/2023 REFERRING MD: Dr. Mahala Menghini, CHIEF COMPLAINT: Right pleural effusion  History of Present Illness:  89 yo M PMH HFpEF, HTN, MVR, Afib on eliquis, CKD 2-3,tachybrady / CHB s/p PPM, who was admitted to [facility] 10/28/23 with CC SOB + cough, found to have bilateral pleural effusions. Notably, was admitted to [facility] 11/11-11/14/24 with R>L pleural effusions, which was felt related to his HF + renal disease. While this apparently responded well to diuresis at that time, has not continued to be effective prompting ED eval 12/4 at recommendation of his PCP. He endorses that this came on suddenly and was somewhat episodic, but cannot relate what were the exacerbating or relieving factors. Only a dry cough recently, no wheezing. He reports compliance with lasix but gave different answers during the interview whether this was a long-term medication or not. He does not think he has previously had ankle edema or abdominal distention when he has had pleural effusions that have been diuresed in the past. No history of renal or liver disease. No history of tobacco use. 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      In ED, R>L effusions. BNP 243. Given lasix, admitted to [facility]. Underwent IR thora of R effusion 12/5 with 1.5L off. 12/6 was able to be weaned to RA.  Pleural fluid categorically transudative.  Cards consulted, suggested considering alt etiologies of effusions given non-contributory MVR, and some exam findings that would argue against this being HF driven.    PCCM is consulted in this setting.   On interview he admits to memory decline, rendering some of history possibly inaccurate.    One thora, 1.5L   Pertinent  Medical History   Past Medical History:  Diagnosis Date   Allergy    Arthritis    Atrial fibrillation -permanent    BPH (benign prostatic hyperplasia)     Cancer (HCC)    HX OF SKIN CANCER    CHF (congestive heart failure) (HCC)    resolved after pacemaker - tachycardia induced   Complete heart block (HCC)    Dyspnea    mild   ED (erectile dysfunction)    GERD (gastroesophageal reflux disease)    GLUCOSE INTOLERANCE 10/22/2007   no recent issues   Heart murmur    OSA (obstructive sleep apnea)    NO CPAP    Pacemaker BSX    dual   Pneumonia    HX OF SEVERAL TIMES AS A CHILD    PONV (postoperative nausea and vomiting)    at age 52    Presence of permanent cardiac pacemaker    SUBACUTE BACTERIAL ENDOCARDITIS 1970s   Significant Hospital Events: Including procedures, antibiotic start and stop dates in addition to other pertinent events   eCHO 11/12 Elevated PASP 30s and moderate Mitral Regurg 12/4 admitted to [facility] diuresed. R>L effusions  -  BNP 243 12/5 thora w IR for R effusion  12/6 transudative fluid. Cards consulted. PCCM consulted as etiology may not be cardiac   Interim History / Subjective:  Some shortness of breath Cough  Objective   Blood pressure (!) 122/90, pulse (!) 106, temperature 97.6 F (36.4 C), temperature source Oral, resp. rate 17, height 5\' 10"  (1.778 m), weight 66.4 kg, SpO2 94%.        Intake/Output Summary (Last 24 hours) at 11/02/2023 0954 Last data filed at 11/02/2023 0815 Gross per  24 hour  Intake 697 ml  Output 1050 ml  Net -353 ml   Filed Weights   10/31/23 0500 11/01/23 0513 11/02/23 0659  Weight: 64.9 kg 65.3 kg 66.4 kg    Examination: General: Elderly, does not appear to be in distress HENT: Moist oral mucosa Lungs: Decreased air entry at the bases bilaterally Cardiovascular: S1-S2 appreciated Abdomen: Soft, bowel sounds appreciated Extremities: No clubbing, no edema Neuro: Alert and oriented x 3 GU:   Resolved Hospital Problem list     Assessment & Plan:  Right pleural effusion -Exudative -Lymphocyte predominance and a transudate will be likely from heart  failure  History of heart failure with preserved ejection fraction, MVR, elevated BNP I/o- is even so far so has not really diuresed much  Since he has only had 1 thoracentesis for 1.5 L Pleurx likely not appropriate in this situation  I do believe we can increase diuresis safely with monitoring to achieve negative fluid balance-going by his I/O if it is accurately documented With his echo showing compensatory changes, adaptation changes-likely has some decompensation of heart failure  Repeat chest x-ray today Follow-up on cytology  Virl Diamond, MD [place] [facility] Pager: See Loretha Stapler

## 2023-11-02 NOTE — Progress Notes (Addendum)
Patient Name: Eric Lambert Date of Encounter: 11/02/2023 [facility] Cardiologist: Sherryl Manges, MD   Interval Summary  .    89 year old man with PMH of permanent atrial fibrillation, tachybradycardia syndrome s/p permanent pacemaker, HFpEF, depression,  bacterial endocarditis in 1970,  who is admitted for pleural effusion and acute HFpEF.   He was recently discharged on  10/08/23 after hospitalization for acute diastolic CHF. Diuresed 1.8L out with improved DOE. He was sent home on lasix 40mg  daily. GDMT was limited due to AKI and mild hypotension during that course. Echo 11/12 showed LVEF 60-65%, no RWMA, normal RV,  PASP36.2 mmHg, massive LAE, severe RAE, mod MR, mild late systolic prolapse of the middle scallop of the posterior leaflet of the mitral valve, aortic sclerosis.   He returned to ER 12/6 for more DOE, cough. CXR with enlarging right pleural effusion and congestin. S/p right thoracentesis 1.5L 12/5, transudative based on LDH/light's criteria. Cytology pending. CT repeat on 12/7 showed small left pleura effusion >R, progressive  airspace consolidation in the dependent aspect of the left lower lobe. New geographic ground-glass and airspace opacities peripherally in both upper lobes. CXR 12/8 showed Enlarged right and similar left pleural effusions. Suspect developing right-sided loculation Cardiology had signed off 10/30/23, Dr Cristal Deer had reviewed Echo from 11/12, felt mitral regurgitation is not severe and not contributing current clinical symptoms, recommended diuresis to euvolemia. Cardiology is re-called today by hospitlaist, query need of RHC given recurrent pleural effusion.   He states he has been taking lasix 20mg  TID since 10/08/23 discharge, seems to urinate well. He has overall fatigue, low energy, chronic cough especially at night over the past 6 months.  He denied any leg swelling, rapid weight gain, fever, recent travel outside the country. He felt  his problems is not his heart. He reports childhood scarlet fever.    Vital Signs .    Vitals:   11/01/23 1319 11/01/23 1945 11/02/23 0448 11/02/23 0659  BP: 107/72 139/76 (!) 122/90   Pulse: 82 88 (!) 106   Resp: 14 17 17    Temp: 97.7 F (36.5 C) 98.7 F (37.1 C) 97.6 F (36.4 C)   TempSrc: Oral Oral Oral   SpO2: 96% 95% 94%   Weight:    66.4 kg  Height:        Intake/Output Summary (Last 24 hours) at 11/02/2023 1032 Last data filed at 11/02/2023 0815 Gross per 24 hour  Intake 697 ml  Output 1050 ml  Net -353 ml      11/02/2023    6:59 AM 11/01/2023    5:13 AM 10/31/2023    5:00 AM  Last 3 Weights  Weight (lbs) 146 lb 6.2 oz 143 lb 15.4 oz 143 lb 1.3 oz  Weight (kg) 66.4 kg 65.3 kg 64.9 kg      Telemetry/ECG    Sinus rhythm, intermittently V paced - Personally Reviewed  Physical Exam .   GEN: No acute distress.   Neck: No JVD Cardiac: RRR, grade III systolic murmur apex  Respiratory: Clear to auscultation bilaterally. On room air. Speaks full sentence  GI: Soft, nontender, non-distended  MS: No leg edema  Assessment & Plan .     Acute on chronic diastolic heart failure  Mitral regurgitation, moderate  Mitral valve prolapse  Recurrent pleural effusion  - presented with DOE, recent hospitalization for diastolic CHF Nov 2024  - BNP 243, POA, was 229 10/05/23 - CXR showed enlarging R>L pleural effusions, increasing probable compressive atelectasis  at both lung bases. Vascular congestion without overt pulmonary edema. POA - Echo 11/12 showed LVEF 60-65%, no RWMA, normal RV,  PASP36.2 mmHg, massive LAE, severe RAE, mod MR, mild late systolic prolapse of the middle scallop of the posterior leaflet of the mitral valve, aortic sclerosis.  - s/p right throacentesis 12/5, 1.5L, transudative based on LDH/light's criteria. Cytology pending.  - CT repeat on 12/7 showed small left pleura effusion >R, progressive  airspace consolidation in the dependent aspect of the left  lower lobe. New geographic ground-glass and airspace opacities peripherally in both upper lobes.  - CXR repeat 12/8 showed enlarged right and similar left pleural effusions. Suspect developing right-sided loculation - on PTA lasix 20mg  TID, has been diuresed with PO Lasix 20mg  BID here, UOP last 24 hours, Net +246ml, weight is 146.83 >141.54 >143.96 >146.39 ib per records today, clinically he appears euvolemic, will increase lasix to 40mg  BID to see if this improves symptoms  - await final pleural fluid cytology  - will repeat limited Echo today  - maybe hypoalbuminemia contributing, no overwhelming evidence suggest infection etiology, maybe reasonable to consider autoimmune workup even though transudative effusion -Do not think there is benefit for RHC given his age and ultimate medical management option  Tachycardia-bradycardia syndrome s/p PPM - requested Environmental manager Rep to interrogate device today    Permanent A fib - remains in A fib, rate controlled, continue PTA verapamil and eliquis      For questions or updates, please contact [facility] Please consult [email or web address] for contact info under        Signed, Cyndi Bender, NP    Patient seen and examined.  Agree with above documentation.  On exam, patient is alert and oriented, regular rate and rhythm, no murmurs, diminished breath sounds, no LE edema or JVD.  He is a 89 year old male with permanent atrial fibrillation, tachybradycardia syndrome status post PPM, chronic diastolic heart failure was admitted with acute on chronic diastolic heart failure and pleural effusion.  He was discharged last month after admission for heart failure exacerbation, discharged on p.o. Lasix 40 mg daily.  Echo at that time showed EF 60 to 65%, severe biatrial enlargement, moderate mitral regurgitation.  Presented with worsening shortness of breath, underwent thoracentesis 12/5, with 1.5 L transudative fluid removed.  Most recent BP  108/80, Labs notable for creatinine 0.9.  On telemetry he remains in rate controlled A-fib.  For his diastolic heart failure/pleural effusion, will consolidate his Lasix to 40 mg twice daily.  Little Ishikawa, MD

## 2023-11-02 NOTE — Progress Notes (Signed)
Mobility Specialist - Progress Note  Pre-mobility: 76 bpm HR, 95% SpO2 During mobility: 160 bpm HR, 91%SpO2 Post-mobility: 103 bpm HR, 96% SPO2   11/02/23 0930  Mobility  Activity Ambulated with assistance in hallway  Level of Assistance Standby assist, set-up cues, supervision of patient - no hands on  Assistive Device [employer] Ambulated (ft) 350 ft  Range of Motion/Exercises Active  Activity Response Tolerated fair  Mobility Referral Yes  Mobility visit 1 Mobility  Mobility Specialist Start Time (ACUTE ONLY) 0910  Mobility Specialist Stop Time (ACUTE ONLY) 0930  Mobility Specialist Time Calculation (min) (ACUTE ONLY) 20 min   Pt was found in bed and agreeable to ambulate. C/o SOB with session. Had x3 brief standing rest breaks due to HR increasing >140 bpm. Able to decrease to low 100s in <1 min each time. Pt returned to bed with all needs met. Call bell in reach.  Billey Chang Mobility Specialist

## 2023-11-02 NOTE — Progress Notes (Signed)
HOSPITALIST ROUNDING NOTE [facility] ZHY:[id number]  DOB: [birth year]  DOA: 10/28/2023  PCP: [name], MD  11/02/2023,3:54 PM   LOS: 4 days      Code Status: Full From: Home  current Dispo: Unclear     89 year old white male Known history of A-fib complicated by heart block heart failure-s/p Social worker. Klein-this was in the setting of bacterial endocarditis in 1970 Previous left hip osteoarthritis status post left hip arthroplasty Possible dysphagia stricture status postdilatation by GI  Recent hospitalization 11/11-11/14/2024 DOE, PND, bilateral pleural effusions CT confirmed more right side greater than left side patient was placed on diuretics EF was 60-65% with moderate mitral valve regurg diuresed 1.8 L--hospitalization complicated by mild renal insufficiency  Developed worsening DOE nonproductive cough despite continue diuretics came to ED 12/5 IR consulted-1.5 L thoracentesis performed 12/6 cardiology consulted-felt not heart failure related--- pulmonology consulted workup performed US RUQ = hepatic steatosis 12/7 CT chest small effusions right greater than sign left progressive consolidation dependent left lung airspace opacities peripherally both upper lobes 12/8 cxr--loculation of fluid on R side 12/9 rpt ECHO EF 55-60%--mod MR  Plan  Recurrent pleural effusions right sided--initially felt to be heart failure --Lymphocytic feel this is inflammatory Thoracentesis predominantly lymphocytic--- prelim path neg malignancy-pulmonary aware--getting 24 hr protein r/o nephrotic synd Empiric Augmentin x 3 days --EOT 12/10 Lasix adjusted to 40 bid--await further plans re: diuresis vs pigtail cath per specialists --volum status -429  Atrial fibrillation heart block PPM HFpEF 60-65% Continue Eliquis 5 twice daily for now--if procedures, switch to lovenox Continue lower dose of verapamil 180 CD (home dose 240)  Hypokalemia Now resolved--cut dose to 40  qd  AK on admit superimposed on CKD 2 Creatinine appears relatively stable Resolved   depression Continue Lexapro will 10, trazodone  Discussed with wife at bedside--explain unclear inciting and aggravating etiology---all q's answered  DVT prophylaxis: Eliquis  Status is: Observation The patient will require care spanning > 2 midnights and should be moved to inpatient because:   Further workup-pulmonology seeing    Subjective:  No fever sob Looks fair Not needing oxygen--no sputum  Objective + exam Vitals:   11/01/23 1945 11/02/23 0448 11/02/23 0659 11/02/23 1231  BP: 139/76 (!) 122/90  108/80  Pulse: 88 (!) 106  92  Resp: 17 17    Temp: 98.7 F (37.1 C) 97.6 F (36.4 C)  97.6 F (36.4 C)  TempSrc: Oral Oral  Oral  SpO2: 95% 94%  96%  Weight:   66.4 kg   Height:       Filed Weights   10/31/23 0500 11/01/23 0513 11/02/23 0659  Weight: 64.9 kg 65.3 kg 66.4 kg    Examination:  Awake coherent S1-S2 no murmur No jvd, trace LE edema Neuro is intact  Data Reviewed: reviewed   CBC    Component Value Date/Time   WBC 7.1 11/02/2023 0359   RBC 4.48 11/02/2023 0359   HGB 13.8 11/02/2023 0359   HGB 13.9 09/08/2023 1448   HCT 41.5 11/02/2023 0359   HCT 42.2 09/08/2023 1448   PLT 127 (L) 11/02/2023 0359   PLT 142 (L) 09/08/2023 1448   MCV 92.6 11/02/2023 0359   MCV 93 09/08/2023 1448   MCH 30.8 11/02/2023 0359   MCHC 33.3 11/02/2023 0359   RDW 15.2 11/02/2023 0359   RDW 12.7 09/08/2023 1448   LYMPHSABS 1.6 11/02/2023 0359   LYMPHSABS 1.6 01/13/2017 1533   MONOABS 0.8 11/02/2023 0359   EOSABS 0.2 11/02/2023  0359   EOSABS 0.1 01/13/2017 1533   BASOSABS 0.0 11/02/2023 0359   BASOSABS 0.0 01/13/2017 1533      Latest Ref Rng & Units 11/02/2023    3:59 AM 11/01/2023    4:17 AM 10/31/2023    4:08 AM  CMP  Glucose 70 - 99 mg/dL 95  130  865   BUN 8 - 23 mg/dL 31  37  37   Creatinine 0.61 - 1.24 mg/dL 7.84  6.96  2.95   Sodium 135 - 145 mmol/L 132  135   135   Potassium 3.5 - 5.1 mmol/L 4.2  4.5  4.0   Chloride 98 - 111 mmol/L 102  103  105   CO2 22 - 32 mmol/L 21  22  22    Calcium 8.9 - 10.3 mg/dL 8.3  8.7  8.3   Total Protein 6.5 - 8.1 g/dL 6.1  6.1  5.6   Total Bilirubin <1.2 mg/dL 1.1  0.9  1.2   Alkaline Phos 38 - 126 U/L 66  67  63   AST 15 - 41 U/L 15  15  16    ALT 0 - 44 U/L 16  19  19      Scheduled Meds:  amoxicillin-clavulanate  1 tablet Oral Q12H   apixaban  5 mg Oral BID   famotidine  40 mg Oral QHS   furosemide  40 mg Oral BID   potassium chloride  40 mEq Oral BID   verapamil  180 mg Oral QPM   Continuous Infusions:  Time  26  [name], MD  [facility]

## 2023-11-02 NOTE — Progress Notes (Signed)
Notified MD that patient refused to get a PIV as his previous one came out sometime probably during the night. RN tried to explain to patient the need for it, but patient still refused.

## 2023-11-02 NOTE — Plan of Care (Signed)
  Problem: Education: Goal: Knowledge of General Education information will improve Description: Including pain rating scale, medication(s)/side effects and non-pharmacologic comfort measures Outcome: Progressing   Problem: Activity: Goal: Risk for activity intolerance will decrease Outcome: Progressing   Problem: Nutrition: Goal: Adequate nutrition will be maintained Outcome: Progressing   Problem: Coping: Goal: Level of anxiety will decrease Outcome: Progressing   Problem: Elimination: Goal: Will not experience complications related to urinary retention Outcome: Progressing   Problem: Pain Management: Goal: General experience of comfort will improve Outcome: Progressing   Problem: Safety: Goal: Ability to remain free from injury will improve Outcome: Progressing   Problem: Skin Integrity: Goal: Risk for impaired skin integrity will decrease Outcome: Progressing

## 2023-11-03 DIAGNOSIS — I5033 Acute on chronic diastolic (congestive) heart failure: Secondary | ICD-10-CM | POA: Diagnosis not present

## 2023-11-03 DIAGNOSIS — J90 Pleural effusion, not elsewhere classified: Secondary | ICD-10-CM | POA: Diagnosis not present

## 2023-11-03 LAB — CBC WITH DIFFERENTIAL/PLATELET
Abs Immature Granulocytes: 0.01 10*3/uL (ref 0.00–0.07)
Basophils Absolute: 0 10*3/uL (ref 0.0–0.1)
Basophils Relative: 1 %
Eosinophils Absolute: 0.2 10*3/uL (ref 0.0–0.5)
Eosinophils Relative: 3 %
HCT: 42.5 % (ref 39.0–52.0)
Hemoglobin: 14 g/dL (ref 13.0–17.0)
Immature Granulocytes: 0 %
Lymphocytes Relative: 27 %
Lymphs Abs: 1.7 10*3/uL (ref 0.7–4.0)
MCH: 30.6 pg (ref 26.0–34.0)
MCHC: 32.9 g/dL (ref 30.0–36.0)
MCV: 92.8 fL (ref 80.0–100.0)
Monocytes Absolute: 0.7 10*3/uL (ref 0.1–1.0)
Monocytes Relative: 12 %
Neutro Abs: 3.7 10*3/uL (ref 1.7–7.7)
Neutrophils Relative %: 57 %
Platelets: 123 10*3/uL — ABNORMAL LOW (ref 150–400)
RBC: 4.58 MIL/uL (ref 4.22–5.81)
RDW: 15.2 % (ref 11.5–15.5)
WBC: 6.4 10*3/uL (ref 4.0–10.5)
nRBC: 0 % (ref 0.0–0.2)

## 2023-11-03 LAB — COMPREHENSIVE METABOLIC PANEL
ALT: 17 U/L (ref 0–44)
AST: 16 U/L (ref 15–41)
Albumin: 2.9 g/dL — ABNORMAL LOW (ref 3.5–5.0)
Alkaline Phosphatase: 63 U/L (ref 38–126)
Anion gap: 11 (ref 5–15)
BUN: 31 mg/dL — ABNORMAL HIGH (ref 8–23)
CO2: 22 mmol/L (ref 22–32)
Calcium: 8.6 mg/dL — ABNORMAL LOW (ref 8.9–10.3)
Chloride: 105 mmol/L (ref 98–111)
Creatinine, Ser: 0.83 mg/dL (ref 0.61–1.24)
GFR, Estimated: >60 mL/min (ref >60)
Glucose, Bld: 85 mg/dL (ref 70–99)
Potassium: 3.7 mmol/L (ref 3.5–5.1)
Sodium: 138 mmol/L (ref 135–145)
Total Bilirubin: 1.1 mg/dL (ref <1.2)
Total Protein: 5.8 g/dL — ABNORMAL LOW (ref 6.5–8.1)

## 2023-11-03 LAB — C-REACTIVE PROTEIN: CRP: 5.2 mg/dL — ABNORMAL HIGH (ref <1.0)

## 2023-11-03 LAB — MAGNESIUM: Magnesium: 2 mg/dL (ref 1.7–2.4)

## 2023-11-03 LAB — SEDIMENTATION RATE: Sed Rate: 7 mm/h (ref 0–16)

## 2023-11-03 MED ORDER — VERAPAMIL HCL ER 180 MG PO TBCR: 180.0000 mg | EXTENDED_RELEASE_TABLET | Freq: Every evening | ORAL | 2 refills | Status: DC

## 2023-11-03 MED ORDER — POTASSIUM CHLORIDE CRYS ER 20 MEQ PO TBCR: 40.0000 meq | EXTENDED_RELEASE_TABLET | Freq: Every day | ORAL | 0 refills | Status: DC

## 2023-11-03 MED ORDER — FUROSEMIDE 40 MG PO TABS: 40.0000 mg | ORAL_TABLET | Freq: Two times a day (BID) | ORAL | 1 refills | Status: DC

## 2023-11-03 NOTE — Progress Notes (Signed)
Patient received discharge orders to go home. Patient was given discharge paperwork/instructions. RN went over discharge instructions/paperwork with patient and patient's daughter. All questions/concerns were addressed/answered to the best of RN's ability. Patient left the hospital stable, had discharge paperwork/instructions, and had all personal belongings.

## 2023-11-03 NOTE — Plan of Care (Signed)

## 2023-11-03 NOTE — Discharge Summary (Signed)
Physician Discharge Summary  [facility] ZOX:[id number] DOB: 02/19/1929 DOA: 10/28/2023  PCP: [name], MD  Admit date: 10/28/2023 Discharge date: 11/03/2023  Time spent: 25 minutes  Recommendations for Outpatient Follow-up:  Needs chem-12, cbc 1 week And outpatient chest x-ray in 1 to 2 weeks-warning signs given for swelling and shortness of breath Please follow-up as an outpatient ANA sed rate-CRP was slightly elevated may require further workup Recommend close monitoring of fluid status and titrate Lasix as needed  Discharge Diagnoses:  MAIN problem for hospitalization   Presumed right-sided heart failure with lymphocytic pleural effusion   Please see below for itemized issues addressed in HOpsital- refer to other progress notes for clarity if needed  Discharge Condition: Fair  Diet recommendation: Heart healthy  Filed Weights   11/01/23 0513 11/02/23 0659 11/03/23 0357  Weight: 65.3 kg 66.4 kg 64.4 kg    History of present illness:  89 year old white male Known history of A-fib complicated by heart block heart failure-s/p [employer]. Klein-this was in the setting of bacterial endocarditis in 1970 Previous left hip osteoarthritis status post left hip arthroplasty Possible dysphagia stricture status postdilatation by GI   Recent hospitalization 11/11-11/14/2024 DOE, PND, bilateral pleural effusions CT confirmed more right side greater than left side patient was placed on diuretics EF was 60-65% with moderate mitral valve regurg diuresed 1.8 L--hospitalization complicated by mild renal insufficiency   Developed worsening DOE nonproductive cough despite continue diuretics came to ED 12/5 IR consulted-1.5 L thoracentesis performed 12/6 cardiology consulted-felt not heart failure related--- pulmonology consulted workup performed US RUQ = hepatic steatosis 12/7 CT chest small effusions right greater than sign left progressive consolidation  dependent left lung airspace opacities peripherally both upper lobes 12/8 cxr--loculation of fluid on R side 12/9 rpt ECHO EF 55-60%--mod MR  Hospital Course:  Recurrent pleural effusions lymphocytic?  Inflammatory?  Acute right-sided heart failure CRP Elevated 5.2, sed rate negative ANA  pending at time of discharg outpatient Total protein and 24-hour urine was only 202 mg which rules out nephrotic syndrome as a cause  cardiology re-consulted and did not feel patient rewould benefit from right-sided heart cath so we will defer to them for further management To facilitate diuresis I have cut back Cardizem CD2 180 patient will go home on Lasix 40 twice daily  Atrial fibrillation PPM exchange and heart block with EF 55-60% and moderate MR Going home on home Eliquis 5 twice daily Verapamil [place]ati CAD as above  AKI this hospital stay When patient is aggressively diuresis he tends to become alkalotic and develops mild AKI Will need labs in a week  Hypokalemia resolved  Depression Lexapro and trazodone continued at discharge   High risk for readmission-hopeful to avoid the same with higher dose Lasix and monitoring as an outpatient CC PCP  Discharge Exam: Vitals:   11/02/23 1924 11/03/23 0357  BP: 123/78 120/70  Pulse: 79 61  Resp: 20 19  Temp: 98.6 F (37 C) 97.8 F (36.6 C)  SpO2: 94% 97%    Subj on day of d/c   Awake coherent alert wants to go home He is not on oxygen he is breathing fairly and has been walking in the hallway  General Exam on discharge  EOMI NCAT no focal deficit No JVD Sinus with intermittent PVC Abdomen soft No lower extremity edema Power 5/5  Discharge Instructions   Discharge Instructions     Diet - low sodium heart healthy   Complete by: As directed  Discharge instructions   Complete by: As directed    IT WAS FELT THIS HOSPITAL STAY THAT U HAD LIKELY HEART FAILURE contributing to your issues---it isnt completely clear if there were  other causes. We will give u diurectics---follow the directions carefully, you will need follow up ith cardiology and with Pulmonology and an xraY of chest in 1 week We did some labs and other studies to figure out if there are other causes.  Those will be follwoed in the OP setting Please return if you are short of breath or ur ankles get swollen---good luck   Increase activity slowly   Complete by: As directed       Allergies as of 11/03/2023   No Known Allergies      Medication List     STOP taking these medications    escitalopram 5 MG tablet Commonly known as: LEXAPRO       TAKE these medications    Eliquis 5 MG Tabs tablet Generic drug: apixaban Take 1 tablet by mouth twice daily   famotidine 40 MG tablet Commonly known as: Pepcid Take 1 tablet (40 mg total) by mouth at bedtime.   furosemide 40 MG tablet Commonly known as: LASIX Take 1 tablet (40 mg total) by mouth 2 (two) times daily. What changed: when to take this   OCUVITE EYE HEALTH FORMULA PO Take 1 tablet by mouth in the morning and at bedtime.   omeprazole 40 MG capsule Commonly known as: PRILOSEC TAKE 1 CAPSULE BY MOUTH TWICE DAILY 30-60 MINUTES BEFORE MEALS (BREAKFAST AND DINNER) What changed: See the new instructions.   potassium chloride SA 20 MEQ tablet Commonly known as: KLOR-CON M Take 2 tablets (40 mEq total) by mouth daily. Start taking on: November 04, 2023   traZODone 50 MG tablet Commonly known as: DESYREL Take 0.5-1 tablets (25-50 mg total) by mouth at bedtime as needed for sleep.   verapamil 180 MG CR tablet Commonly known as: CALAN-SR Take 1 tablet (180 mg total) by mouth every evening. What changed:  medication strength how much to take when to take this   VITAMIN B-12 PO Take 2 tablets by mouth daily.   VITAMIN D-3 PO Take 1 tablet by mouth daily.       No Known Allergies    The results of significant diagnostics from this hospitalization (including imaging,  microbiology, ancillary and laboratory) are listed below for reference.    Significant Diagnostic Studies: DG CHEST PORT 1 VIEW  Result Date: 11/02/2023 CLINICAL DATA:  Pleural effusion EXAM: PORTABLE CHEST 1 VIEW COMPARISON:  X-ray 11/01/2023 and older FINDINGS: Hyperinflation. Persistent bilateral pleural effusions but decreasing from previous. Small remaining. Adjacent lung base opacities are persistent. No pneumothorax. Chronic lung changes with hyperinflation. Enlarged cardiopericardial silhouette. Calcified aorta. Left upper chest pacemaker. Overlapping cardiac leads. IMPRESSION: Decreasing pleural effusions with significant residual. Electronically Signed   By: Karen Kays M.D.   On: 11/02/2023 14:57   ECHOCARDIOGRAM LIMITED  Result Date: 11/02/2023    ECHOCARDIOGRAM LIMITED REPORT   Patient Name:   Eric Lambert Date of Exam: 11/02/2023 Medical Rec #:  [id number]              Height:       70.0 in Accession #:    [id number]             Weight:       146.4 lb Date of Birth:  12-25-1928  BSA:          1.828 m Patient Age:    89 years               BP:           122/90 mmHg Patient Gender: M                      HR:           94 bpm. Exam Location:  Inpatient Procedure: Limited Echo, Cardiac Doppler and Color Doppler Indications:    CHF- Acute Diastolic  History:        Patient has prior history of Echocardiogram examinations, most                 recent 10/06/2023. Arrythmias:Atrial Fibrillation; Risk                 Factors:Sleep Apnea and Hypertension.  Sonographer:    Karma Ganja Referring Phys: [id number] Cyndi Bender IMPRESSIONS  1. Left ventricular ejection fraction, by estimation, is 55 to 60%. The left ventricle has normal function. The left ventricle has no regional wall motion abnormalities.  2. Right ventricular systolic function is normal. The right ventricular size is normal. There is mildly elevated pulmonary artery systolic pressure.  3. Left atrial size was severely  dilated.  4. Right atrial size was moderately dilated.  5. Some splay artifact but still likely moderate MR. Likely atrial FMR. The mitral valve is abnormal. Moderate mitral valve regurgitation. No evidence of mitral stenosis.  6. The aortic valve is tricuspid. There is mild calcification of the aortic valve. There is mild thickening of the aortic valve. Aortic valve regurgitation is not visualized. Aortic valve sclerosis is present, with no evidence of aortic valve stenosis.  7. The inferior vena cava is normal in size with greater than 50% respiratory variability, suggesting right atrial pressure of 3 mmHg. FINDINGS  Left Ventricle: Left ventricular ejection fraction, by estimation, is 55 to 60%. The left ventricle has normal function. The left ventricle has no regional wall motion abnormalities. The left ventricular internal cavity size was normal in size. There is  no left ventricular hypertrophy. Right Ventricle: The right ventricular size is normal. No increase in right ventricular wall thickness. Right ventricular systolic function is normal. There is mildly elevated pulmonary artery systolic pressure. The tricuspid regurgitant velocity is 3.03  m/s, and with an assumed right atrial pressure of 3 mmHg, the estimated right ventricular systolic pressure is 39.7 mmHg. Left Atrium: Left atrial size was severely dilated. Right Atrium: Right atrial size was moderately dilated. Pericardium: There is no evidence of pericardial effusion. Mitral Valve: Some splay artifact but still likely moderate MR. Likely atrial FMR. The mitral valve is abnormal. There is mild thickening of the mitral valve leaflet(s). There is mild calcification of the mitral valve leaflet(s). Moderate mitral valve regurgitation. No evidence of mitral valve stenosis. Tricuspid Valve: The tricuspid valve is normal in structure. Tricuspid valve regurgitation is mild . No evidence of tricuspid stenosis. Aortic Valve: The aortic valve is tricuspid.  There is mild calcification of the aortic valve. There is mild thickening of the aortic valve. Aortic valve regurgitation is not visualized. Aortic valve sclerosis is present, with no evidence of aortic valve stenosis. Pulmonic Valve: The pulmonic valve was normal in structure. Pulmonic valve regurgitation is not visualized. No evidence of pulmonic stenosis. Aorta: The aortic root is normal in size and structure. Venous: The inferior vena cava is  normal in size with greater than 50% respiratory variability, suggesting right atrial pressure of 3 mmHg. IAS/Shunts: No atrial level shunt detected by color flow Doppler. Additional Comments: Spectral Doppler performed. Color Doppler performed.  LEFT VENTRICLE PLAX 2D LVIDd:         5.10 cm LVIDs:         2.80 cm LV PW:         1.00 cm LV IVS:        1.00 cm LVOT diam:     2.00 cm LVOT Area:     3.14 cm  LV Volumes (MOD) LV vol d, MOD A2C: 81.7 ml LV vol d, MOD A4C: 92.0 ml LV vol s, MOD A2C: 41.4 ml LV vol s, MOD A4C: 44.9 ml LV SV MOD A2C:     40.3 ml LV SV MOD A4C:     92.0 ml LV SV MOD BP:      44.6 ml IVC IVC diam: 1.90 cm LEFT ATRIUM              Index        RIGHT ATRIUM           Index LA diam:        5.70 cm  3.12 cm/m   RA Area:     26.30 cm LA Vol (A2C):   146.0 ml 79.87 ml/m  RA Volume:   86.70 ml  47.43 ml/m LA Vol (A4C):   107.0 ml 58.53 ml/m LA Biplane Vol: 126.0 ml 68.93 ml/m  TRICUSPID VALVE TR Peak grad:   36.7 mmHg TR Vmax:        303.00 cm/s  SHUNTS Systemic Diam: 2.00 cm Charlton Haws MD Electronically signed by Charlton Haws MD Signature Date/Time: 11/02/2023/1:26:14 PM    Final    DG Chest 2 View  Result Date: 11/01/2023 CLINICAL DATA:  Pneumonia.  Shortness of breath EXAM: CHEST - 2 VIEW COMPARISON:  10/30/2023 plain film.  Yesterday's CT FINDINGS: Right atrial and ventricular pacer leads. Right-sided pleural effusion, small but increased with suggestion of loculation laterally. Small left pleural effusion is likely similar given differences  in technique. No pneumothorax. Midline trachea. Mild cardiomegaly. Atherosclerosis in the transverse aorta. Improved interstitial edema with mild venous congestion remaining. Similar bibasilar airspace disease. IMPRESSION: Cardiomegaly with improved interstitial edema. Mild venous congestion remains. Enlarged right and similar left pleural effusions. Suspect developing right-sided loculation. Similar bibasilar Airspace disease, likely atelectasis. Aortic Atherosclerosis (ICD10-I70.0). Electronically Signed   By: Jeronimo Greaves M.D.   On: 11/01/2023 11:15   CT CHEST WO CONTRAST  Result Date: 10/31/2023 CLINICAL DATA:  Loculated pleural effusion EXAM: CT CHEST WITHOUT CONTRAST TECHNIQUE: Multidetector CT imaging of the chest was performed following the standard protocol without IV contrast. RADIATION DOSE REDUCTION: This exam was performed according to the departmental dose-optimization program which includes automated exposure control, adjustment of the mA and/or kV according to patient size and/or use of iterative reconstruction technique. COMPARISON:  10/05/2023 FINDINGS: Cardiovascular: Right subclavian transvenous pacemaker with leads to the right atrium and RV apex. Borderline cardiomegaly with lead to left atrial enlargement. No pericardial effusion. Scattered coronary calcifications. Calcified plaque in the aortic arch and descending thoracic segment. Mediastinum/Nodes: No mass or adenopathy. Lungs/Pleura: Small pleural effusions right greater than left, slightly increased on the left since prior study. No pneumothorax. [place] ground-glass and airspace opacities peripherally in both upper lobes. Progressive airspace consolidation in the dependent aspect of the left lower lobe. Continued airspace opacities posteriorly in  the right lower lobe with slightly improved aeration. Subcentimeter calcified granulomas in the left lower lobe. Upper Abdomen: No acute findings. Musculoskeletal: Vertebral endplate  spurring at multiple levels in the mid and lower thoracic spine. IMPRESSION: 1. Small pleural effusions right greater than left, slightly increased on the left since prior study. 2. Progressive airspace consolidation in the dependent aspect of the left lower lobe. 3. New geographic ground-glass and airspace opacities peripherally in both upper lobes. 4. Coronary and aortic Atherosclerosis (ICD10-I70.0). Electronically Signed   By: Corlis Leak M.D.   On: 10/31/2023 10:59   US Abdomen Limited RUQ (LIVER/GB)  Result Date: 10/30/2023 CLINICAL DATA:  Hyperbilirubinemia EXAM: ULTRASOUND ABDOMEN LIMITED RIGHT UPPER QUADRANT COMPARISON:  None Available. FINDINGS: Gallbladder: No gallstones or wall thickening visualized. No sonographic Murphy sign noted by sonographer. Common bile duct: Diameter: 2 mm Liver: Hyperechoic hepatic parenchyma, suggesting hepatic steatosis. No focal hepatic lesion is seen. Portal vein is patent on color Doppler imaging with normal direction of blood flow towards the liver. Other: None. IMPRESSION: Hepatic steatosis. Otherwise negative right upper quadrant ultrasound. Electronically Signed   By: Charline Bills M.D.   On: 10/30/2023 23:08   DG CHEST PORT 1 VIEW  Result Date: 10/30/2023 CLINICAL DATA:  89 year old male status post right side thoracentesis yesterday. EXAM: PORTABLE CHEST 1 VIEW COMPARISON:  Post thoracentesis portable chest 10/29/2023 and earlier. FINDINGS: Portable AP view at 0802 hours. Stable large lung volumes. Stable cardiomegaly and left chest pacemaker. Bilateral veiling lung base opacity, remains decreased on the right compared to 10/28/2023. Increased pulmonary interstitial opacity, vascularity diffusely. No pneumothorax. No air bronchograms. Stable visualized osseous structures. IMPRESSION: Increased interstitial opacity since yesterday favored to be acute interstitial edema. Residual bilateral pleural effusions, regressed on the right following thoracentesis  yesterday. No pneumothorax. Electronically Signed   By: Odessa Fleming M.D.   On: 10/30/2023 11:16   DG Chest 1 View  Result Date: 10/29/2023 CLINICAL DATA:  Status post thoracentesis. EXAM: CHEST  1 VIEW portable semi upright COMPARISON:  10/28/2023. FINDINGS: Decreasing right effusion with trace residual. No pneumothorax. Persistent left effusion with opacity. Stable cardiopericardial silhouette. Calcified aorta. No edema. Overlapping cardiac leads. Left upper chest pacemaker with leads along the right side of the heart. IMPRESSION: Decreasing right effusion post thoracentesis.  No pneumothorax. Electronically Signed   By: Karen Kays M.D.   On: 10/29/2023 13:14   US THORACENTESIS ASP PLEURAL SPACE W/IMG GUIDE  Result Date: 10/29/2023 INDICATION: Patient with history of CHF, atrial fibrillation, chronic kidney disease, bilateral pleural effusions right greater than left. Request received for diagnostic and therapeutic right thoracentesis. EXAM: ULTRASOUND GUIDED DIAGNOSTIC AND THERAPEUTIC RIGHT THORACENTESIS MEDICATIONS: 8 mL 1% lidocaine COMPLICATIONS: None immediate. PROCEDURE: An ultrasound guided thoracentesis was thoroughly discussed with the patient and questions answered. The benefits, risks, alternatives and complications were also discussed. The patient understands and wishes to proceed with the procedure. Written consent was obtained. Ultrasound was performed to localize and mark an adequate pocket of fluid in the right chest. The area was then prepped and draped in the normal sterile fashion. 1% Lidocaine was used for local anesthesia. Under ultrasound guidance a 6 Fr Safe-T-Centesis catheter was introduced. Thoracentesis was performed. The catheter was removed and a dressing applied. FINDINGS: A total of approximately 1.5 liters of yellow fluid was removed. Samples were sent to the laboratory as requested by the clinical team. IMPRESSION: Successful ultrasound guided diagnostic and therapeutic right  thoracentesis yielding 1.5 liters of pleural fluid. Performed by: [name]  Electronically Signed   By: Marliss Coots M.D.   On: 10/29/2023 13:08   DG Chest 2 View  Result Date: 10/28/2023 CLINICAL DATA:  Shortness of breath, increasing with exertion. Recent change in diuretic dosage. EXAM: CHEST - 2 VIEW COMPARISON:  Radiographs 10/06/2023 and 10/05/2023.  CT 10/05/2023. FINDINGS: Left subclavian pacemaker leads appear unchanged, projecting over the right atrium and right ventricle. The heart size and mediastinal contours are stable. There are enlarging right greater than left pleural effusions with increasing probable compressive atelectasis at both lung bases. There is vascular congestion without overt pulmonary edema. No confluent airspace disease or pneumothorax. The bones appear unchanged. IMPRESSION: Enlarging right greater than left pleural effusions with increasing probable compressive atelectasis at both lung bases. Vascular congestion without overt pulmonary edema. Electronically Signed   By: Carey Bullocks M.D.   On: 10/28/2023 14:42   CUP PACEART INCLINIC DEVICE CHECK  Result Date: 10/14/2023 Pacemaker check in clinic. Normal RV lead function. RA lead no longer in use. Thresholds, sensing, impedance's consistent with previous measurements. Device programmed to maximize longevity. No mode switch or high ventricular rates noted. Device programmed at appropriate safety margins. Histogram distribution appropriate. Device programmed to optimize intrinsic conduction. Estimated longevity 10.5 years. Patient enrolled in remote follow-up. Patient education completed. Dermabond removed from patient's incision site without complication, and patient tolerated well. Site WNL and without s/s of infection. Generator change only. No arm restrictions in place. Wound care instructions given.Casilda Carls, BSN, RN  Portable chest 1 View  Result Date: 10/06/2023 CLINICAL DATA:  Pleural effusion CHF  EXAM: PORTABLE CHEST 1 VIEW COMPARISON:  X-ray 10/05/2023 and contrast CT scan FINDINGS: Stable cardiopericardial silhouette with calcified aorta. Left upper chest battery pack with leads along the right side of the heart. Chronic lung changes with persistent small effusion and adjacent opacities, right lung base. No edema. Overlapping cardiac leads. IMPRESSION: Hyperinflation with chronic changes.  Pacemaker. Persistent small effusion and opacity in the right lung base Electronically Signed   By: Karen Kays M.D.   On: 10/06/2023 18:03   ECHOCARDIOGRAM COMPLETE  Result Date: 10/06/2023    ECHOCARDIOGRAM REPORT   Patient Name:   Eric Lambert Date of Exam: 10/06/2023 Medical Rec #:  [id number]              Height:       70.0 in Accession #:    [id number]             Weight:       149.9 lb Date of Birth:  02/05/1929              BSA:          1.847 m Patient Age:    89 years               BP:           134/94 mmHg Patient Gender: M                      HR:           94 bpm. Exam Location:  Inpatient Procedure: 2D Echo, Cardiac Doppler and Color Doppler Indications:    CHF - Acute Diastolic I50.31  History:        Patient has prior history of Echocardiogram examinations, most                 recent 03/04/2021. CHF; Arrythmias:Atrial Fibrillation.  Sonographer:    Harriette Bouillon  RDCS Referring Phys: [id number] MICHAEL E NORINS IMPRESSIONS  1. Left ventricular ejection fraction, by estimation, is 60 to 65%. The left ventricle has normal function. The left ventricle has no regional wall motion abnormalities. The left ventricular internal cavity size was mildly dilated. Left ventricular diastolic function could not be evaluated.  2. Right ventricular systolic function is normal. The right ventricular size is moderately enlarged. There is mildly elevated pulmonary artery systolic pressure. The estimated right ventricular systolic pressure is 36.2 mmHg.  3. Left atrial size was massively dilated.  4. Right atrial size  was severely dilated.  5. The mitral valve is degenerative. Moderate mitral valve regurgitation. No evidence of mitral stenosis. There is mild late systolic prolapse of the middle scallop of the posterior leaflet of the mitral valve.  6. The aortic valve is tricuspid. There is moderate calcification of the aortic valve. There is moderate thickening of the aortic valve. Aortic valve regurgitation is not visualized. Aortic valve sclerosis/calcification is present, without any evidence of aortic stenosis.  7. The inferior vena cava is normal in size with greater than 50% respiratory variability, suggesting right atrial pressure of 3 mmHg. FINDINGS  Left Ventricle: Left ventricular ejection fraction, by estimation, is 60 to 65%. The left ventricle has normal function. The left ventricle has no regional wall motion abnormalities. The left ventricular internal cavity size was mildly dilated. There is  no left ventricular hypertrophy. Left ventricular diastolic function could not be evaluated due to atrial fibrillation. Left ventricular diastolic function could not be evaluated. Right Ventricle: The right ventricular size is moderately enlarged. No increase in right ventricular wall thickness. Right ventricular systolic function is normal. There is mildly elevated pulmonary artery systolic pressure. The tricuspid regurgitant velocity is 2.88 m/s, and with an assumed right atrial pressure of 3 mmHg, the estimated right ventricular systolic pressure is 36.2 mmHg. Left Atrium: Left atrial size was massively dilated. Right Atrium: Right atrial size was severely dilated. Pericardium: Trivial pericardial effusion is present. The pericardial effusion is circumferential. Mitral Valve: The mitral valve is degenerative in appearance. There is mild late systolic prolapse of the middle scallop of the posterior leaflet of the mitral valve. There is mild thickening of the mitral valve leaflet(s). There is mild calcification of  the  mitral valve leaflet(s). Mild mitral annular calcification. Moderate mitral valve regurgitation, with eccentric anteriorly directed jet. No evidence of mitral valve stenosis. Tricuspid Valve: The tricuspid valve is normal in structure. Tricuspid valve regurgitation is not demonstrated. No evidence of tricuspid stenosis. Aortic Valve: The aortic valve is tricuspid. There is moderate calcification of the aortic valve. There is moderate thickening of the aortic valve. Aortic valve regurgitation is not visualized. Aortic valve sclerosis/calcification is present, without any  evidence of aortic stenosis. Pulmonic Valve: The pulmonic valve was normal in structure. Pulmonic valve regurgitation is not visualized. No evidence of pulmonic stenosis. Aorta: The aortic root is normal in size and structure. Venous: The inferior vena cava is normal in size with greater than 50% respiratory variability, suggesting right atrial pressure of 3 mmHg. IAS/Shunts: No atrial level shunt detected by color flow Doppler. Additional Comments: A device lead is visualized.  LEFT VENTRICLE PLAX 2D LVIDd:         5.90 cm LVIDs:         4.10 cm LV PW:         1.00 cm LV IVS:        1.00 cm LVOT diam:     2.00 cm  LV SV:         41 LV SV Index:   22 LVOT Area:     3.14 cm  RIGHT VENTRICLE             IVC RV S prime:     12.00 cm/s  IVC diam: 1.00 cm TAPSE (M-mode): 2.3 cm LEFT ATRIUM            Index        RIGHT ATRIUM           Index LA diam:      5.40 cm  2.92 cm/m   RA Area:     24.40 cm LA Vol (A2C): 121.0 ml 65.52 ml/m  RA Volume:   80.50 ml  43.59 ml/m LA Vol (A4C): 151.0 ml 81.77 ml/m  AORTIC VALVE LVOT Vmax:   83.90 cm/s LVOT Vmean:  51.600 cm/s LVOT VTI:    0.132 m  AORTA Ao Root diam: 3.10 cm Ao Asc diam:  2.90 cm MITRAL VALVE                TRICUSPID VALVE MV Area (PHT): 3.87 cm     TR Peak grad:   33.2 mmHg MV Decel Time: 196 msec     TR Vmax:        288.00 cm/s MV E velocity: 135.00 cm/s                             SHUNTS                              Systemic VTI:  0.13 m                             Systemic Diam: 2.00 cm Armanda Magic MD Electronically signed by Armanda Magic MD Signature Date/Time: 10/06/2023/8:56:08 AM    Final    CT Chest W Contrast  Result Date: 10/05/2023 CLINICAL DATA:  Weakness, shortness of breath. Altered mental status. Pneumonia, complication suspected, xray done Respiratory illness, nondiagnostic xray EXAM: CT CHEST WITH CONTRAST TECHNIQUE: Multidetector CT imaging of the chest was performed during intravenous contrast administration. RADIATION DOSE REDUCTION: This exam was performed according to the departmental dose-optimization program which includes automated exposure control, adjustment of the mA and/or kV according to patient size and/or use of iterative reconstruction technique. CONTRAST:  50mL ISOVUE-370 IOPAMIDOL (ISOVUE-370) INJECTION 76% COMPARISON:  Chest x-ray today FINDINGS: Cardiovascular: Left chest wall pacer in place with leads in the right atrium and right ventricle. Cardiomegaly. Coronary artery and aortic atherosclerosis. No evidence of aortic aneurysm. No filling defects in the pulmonary arteries to suggest pulmonary emboli. Mediastinum/Nodes: No mediastinal, hilar, or axillary adenopathy. Trachea and esophagus are unremarkable. Thyroid unremarkable. Lungs/Pleura: Moderate right pleural effusion and small left pleural effusion. Compressive/dependent atelectasis in the lower lobes. Otherwise no confluent opacities. Upper Abdomen: No acute findings Musculoskeletal: Chest wall soft tissues are unremarkable. No acute bony abnormality. IMPRESSION: No evidence of pulmonary embolus. Coronary artery disease. Moderate right pleural effusion and small left pleural effusion with compressive/dependent atelectasis in the lower lobes. Aortic Atherosclerosis (ICD10-I70.0). Electronically Signed   By: Charlett Nose M.D.   On: 10/05/2023 12:58   CT Head Wo Contrast  Result Date: 10/05/2023 CLINICAL  DATA:  Mental status change, unknown cause EXAM: CT HEAD WITHOUT CONTRAST TECHNIQUE: Contiguous axial images were  obtained from the base of the skull through the vertex without intravenous contrast. RADIATION DOSE REDUCTION: This exam was performed according to the departmental dose-optimization program which includes automated exposure control, adjustment of the mA and/or kV according to patient size and/or use of iterative reconstruction technique. COMPARISON:  None Available. FINDINGS: Brain: There is atrophy and chronic small vessel disease changes. No acute intracranial abnormality. Specifically, no hemorrhage, hydrocephalus, mass lesion, acute infarction, or significant intracranial injury. Vascular: No hyperdense vessel or unexpected calcification. Skull: No acute calvarial abnormality. Sinuses/Orbits: No acute findings Other: None IMPRESSION: Atrophy, chronic microvascular disease. No acute intracranial abnormality. Electronically Signed   By: Charlett Nose M.D.   On: 10/05/2023 12:55   DG Chest 2 View  Result Date: 10/05/2023 CLINICAL DATA:  89 year old male with weakness, hypertension, shortness of breath. EXAM: CHEST - 2 VIEW COMPARISON:  Chest radiographs 07/18/2021 and earlier. FINDINGS: AP and lateral views 0859 hours. Lower lung volumes. Cardiomegaly appears progressed. Chronic left chest dual lead pacemaker. New bilateral pleural effusions, small to moderate and greater on the right. Patchy superimposed lung base opacity. Increased pulmonary vascularity elsewhere. No pneumothorax. No definite air bronchograms. No acute osseous abnormality identified. 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 Negative visible bowel gas. IMPRESSION: Cardiomegaly with constellation of abnormal lung opacity most suggestive of pulmonary interstitial edema, pleural effusions (small to moderate). Infection felt less likely. Electronically Signed   By: Odessa Fleming M.D.   On:  10/05/2023 09:06    Microbiology: Recent Results (from the past 240 hour(s))  Body fluid culture w Gram Stain     Status: None   Collection Time: 10/29/23 11:33 AM   Specimen: PATH Cytology Pleural fluid  Result Value Ref Range Status   Specimen Description   Final    PLEURAL Performed at [facility], [address], [place], [place] [address]    Special Requests   Final    NONE Performed at [facility], [address], [place], [place] [address]    Gram Stain   Final    RARE WBC PRESENT,BOTH PMN AND MONONUCLEAR NO ORGANISMS SEEN    Culture   Final    NO GROWTH 3 DAYS Performed at [facility], [address] [place], [place] [address]    Report Status 11/01/2023 FINAL  Final     Labs: Basic Metabolic Panel: Recent Labs  Lab 10/29/23 0429 10/30/23 0403 10/31/23 0408 11/01/23 0417 11/02/23 0359 11/03/23 0347  NA 141 137 135 135 132* 138  K 3.0* 3.3* 4.0 4.5 4.2 3.7  CL 106 103 105 103 102 105  CO2 27 25 22 22  21* 22  GLUCOSE 89 108* 100* 120* 95 85  BUN 32* 35* 37* 37* 31* 31*  CREATININE 0.97 1.04 0.98 1.06 0.90 0.83  CALCIUM 8.3* 8.2* 8.3* 8.7* 8.3* 8.6*  MG 2.1  --  2.1  --   --  2.0   Liver Function Tests: Recent Labs  Lab 10/30/23 0403 10/31/23 0408 11/01/23 0417 11/02/23 0359 11/03/23 0347  AST 17 16 15 15 16   ALT 19 19 19 16 17   ALKPHOS 68 63 67 66 63  BILITOT 1.4* 1.2* 0.9 1.1 1.1  PROT 5.5* 5.6* 6.1* 6.1* 5.8*  ALBUMIN 2.9* 3.0* 3.1* 3.1* 2.9*   No results for input(s): "LIPASE", "AMYLASE" in the last 168 hours. No results for input(s): "AMMONIA" in the last 168 hours. CBC: Recent Labs  Lab 10/30/23 0403 10/31/23 0408  11/01/23 0417 11/02/23 0359 11/03/23 0347  WBC 7.1 6.8 7.3 7.1 6.4  NEUTROABS 5.0 4.3 5.2 4.5 3.7  HGB 13.3 13.5 13.7 13.8 14.0  HCT 41.5 40.6 41.6 41.5 42.5  MCV 93.9 92.5 92.9 92.6 92.8  PLT 127* 124* 124* 127* 123*   Cardiac Enzymes: No results for input(s):  "CKTOTAL", "CKMB", "CKMBINDEX", "TROPONINI" in the last 168 hours. BNP: BNP (last 3 results) Recent Labs    10/05/23 1633 10/28/23 1415  BNP 229.2* 243.6*    ProBNP (last 3 results) No results for input(s): "PROBNP" in the last 8760 hours.  CBG: No results for input(s): "GLUCAP" in the last 168 hours.     Signed:  [name] MD   [facility] 11/03/2023, 2:45 PM

## 2023-11-03 NOTE — TOC Transition Note (Addendum)
Transition of Care [facility]) - CM/SW Discharge Note   Patient Details  Name: Eric Lambert MRN: [id number] Date of Birth: [birth year]  Transition of [facility]) CM/SW Contact:  Howell Rucks, RN Phone Number: 11/03/2023, 3:34 PM   Clinical Narrative:  DC to home,  pt's dtr Tamela Oddi) notified, will provide transportation. No further TOC needs identified.      Final next level of care: Home/Self Care Barriers to Discharge: Barriers Resolved   Patient Goals and CMS Choice      Discharge Placement                    Name of family member notified: Sebastyn Tiedt (spouse) Patient and family notified of of transfer: 11/03/23  Discharge Plan and Services Additional resources added to the After Visit Summary for                                       Social Determinants of Health (SDOH) Interventions SDOH Screenings   Food Insecurity: No Food Insecurity (10/28/2023)  Housing: Low Risk  (10/28/2023)  Transportation Needs: No Transportation Needs (10/28/2023)  Utilities: Not At Risk (10/28/2023)  Depression (PHQ2-9): Low Risk  (05/09/2022)  Tobacco Use: Low Risk  (10/30/2023)  Health Literacy: Adequate Health Literacy (10/15/2023)     Readmission Risk Interventions    11/03/2023    3:33 PM  Readmission Risk Prevention Plan  Transportation Screening Complete  PCP or Specialist Appt within 5-7 Days Complete  Home Care Screening Complete  Medication Review (RN CM) Complete

## 2023-11-03 NOTE — Telephone Encounter (Signed)
Patient will pleural effusion, chest x-ray to be done on the day of visit  Schedule for follow-up in 10 to 14 days  Okay to schedule with any provider that is available   Kindly schedule for follow-up appointment

## 2023-11-03 NOTE — Consult Note (Signed)
[facility] Liaison Consult Note    11/03/2023  Jascha Bence [facility] June 23, 1929 [id number]  Insurance: [employer]   Primary Care Provider: [name], MD, with [place] [facility], this provider is listed for the transition of care follow up appointments  and [facility] calls   [facility] Liaison met patient at bedside at [facility].    The patient was screened for 30 day readmission hospitalization with noted medium risk score for unplanned readmission risk 2 hospital admissions in 6 months.  The patient was assessed for potential Community Care Coordination service needs for post hospital transition for care coordination. Review of patient's electronic medical record reveals patient has been outreached by [employer] RN TOC and patient declined 30 day program for follow up.  Plan: [facility] Liaison will continue to follow progress and disposition to asess for post hospital community care coordination/management needs.  Referral request for community care coordination: anticipate VBCI TOC follow up for post hospital needs and reoffer program follow up   [facility], [facility] does not replace or interfere with any arrangements made by the Inpatient Transition of Care team.   For questions contact:   Charlesetta Shanks, RN, BSN, CCM [place]  [facility], [facility] Liaison Direct Dial: [phone] or secure chat Email: [email or web address]

## 2023-11-03 NOTE — Progress Notes (Signed)
NAME:  Eric Lambert, MRN:  [id number], DOB:  [birth year], LOS: 5 ADMISSION DATE:  10/28/2023, CONSULTATION DATE: 10/30/2023 REFERRING MD: Dr. Mahala Menghini, CHIEF COMPLAINT: Right pleural effusion  History of Present Illness:  89 yo M PMH HFpEF, HTN, MVR, Afib on eliquis, CKD 2-3,tachybrady / CHB s/p PPM, who was admitted to [facility] 10/28/23 with CC SOB + cough, found to have bilateral pleural effusions. Notably, was admitted to [facility] 11/11-11/14/24 with R>L pleural effusions, which was felt related to his HF + renal disease. While this apparently responded well to diuresis at that time, has not continued to be effective prompting ED eval 12/4 at recommendation of his PCP. He endorses that this came on suddenly and was somewhat episodic, but cannot relate what were the exacerbating or relieving factors. Only a dry cough recently, no wheezing. He reports compliance with lasix but gave different answers during the interview whether this was a long-term medication or not. He does not think he has previously had ankle edema or abdominal distention when he has had pleural effusions that have been diuresed in the past. No history of renal or liver disease. No history of tobacco use. 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      In ED, R>L effusions. BNP 243. Given lasix, admitted to [facility]. Underwent IR thora of R effusion 12/5 with 1.5L off. 12/6 was able to be weaned to RA.  Pleural fluid categorically transudative.  Cards consulted, suggested considering alt etiologies of effusions given non-contributory MVR, and some exam findings that would argue against this being HF driven.    PCCM is consulted in this setting.   On interview he admits to memory decline, rendering some of history possibly inaccurate.    One thora, 1.5L   Pertinent  Medical History   Past Medical History:  Diagnosis Date   Allergy    Arthritis    Atrial fibrillation -permanent    BPH (benign prostatic hyperplasia)     Cancer (HCC)    HX OF SKIN CANCER    CHF (congestive heart failure) (HCC)    resolved after pacemaker - tachycardia induced   Complete heart block (HCC)    Dyspnea    mild   ED (erectile dysfunction)    GERD (gastroesophageal reflux disease)    GLUCOSE INTOLERANCE 10/22/2007   no recent issues   Heart murmur    OSA (obstructive sleep apnea)    NO CPAP    Pacemaker BSX    dual   Pneumonia    HX OF SEVERAL TIMES AS A CHILD    PONV (postoperative nausea and vomiting)    at age 61    Presence of permanent cardiac pacemaker    SUBACUTE BACTERIAL ENDOCARDITIS 1970s   Significant Hospital Events: Including procedures, antibiotic start and stop dates in addition to other pertinent events   eCHO 11/12 Elevated PASP 30s and moderate Mitral Regurg 12/4 admitted to [facility] diuresed. R>L effusions  -  BNP 243 12/5 thora w IR for R effusion  12/6 transudative fluid. Cards consulted. PCCM consulted as etiology may not be cardiac   Interim History / Subjective:  Still has some shortness of breath but has not really ambulated much to assess Still has a cough-no worsening  Objective   Blood pressure 120/70, pulse 61, temperature 97.8 F (36.6 C), temperature source Oral, resp. rate 19, height 5\' 10"  (1.778 m), weight 64.4 kg, SpO2 97%.        Intake/Output Summary (Last 24  hours) at 11/03/2023 0848 Last data filed at 11/03/2023 0400 Gross per 24 hour  Intake 720 ml  Output 2026 ml  Net -1306 ml   Filed Weights   11/01/23 0513 11/02/23 0659 11/03/23 0357  Weight: 65.3 kg 66.4 kg 64.4 kg    Examination: General: Gentleman, does not appear to be in distress HENT: Moist oral mucosa Lungs: Air movement bilaterally Cardiovascular: S1-S2 appreciated Abdomen: Soft, bowel sounds appreciated Extremities: No clubbing, no edema Neuro: Alert and oriented x 3 GU:   Chest x-ray 11/02/2023 reviewed by myself shows improvement in pleural effusion  I/o with negative balance Resolved  Hospital Problem list     Assessment & Plan:   Right pleural effusion -Transudative -Lymphocyte predominance and transudative fluid would likely be related to heart failure -Cytology negative  Negative fluid balance Suggest to continue Lasix at current dosing  Since he has only had one thoracentesis for 1.5 L Pleurx likely not appropriate in this situation With chest x-ray showing some improvement with diuresis, will not suggest any invasive intervention at present  With his echo showing compensatory changes, adaptation changes-likely has some decompensation of heart failure -Appreciate cardiology follow-up -Requested for ESR, CRP, ANA with reflex if positive  Virl Diamond, MD [place] PCCM Pager: See Loretha Stapler

## 2023-11-03 NOTE — Progress Notes (Addendum)
Patient Name: Eric Lambert Date of Encounter: 11/03/2023 [facility] Cardiologist: Sherryl Manges, MD   Interval Summary  .    89 year old man with PMH of permanent atrial fibrillation, tachybradycardia syndrome s/p permanent pacemaker, HFpEF, depression,  bacterial endocarditis in 1970,  who is admitted for pleural effusion and acute HFpEF.   He was recently discharged on  10/08/23 after hospitalization for acute diastolic CHF. Diuresed 1.8L out with improved DOE. He was sent home on lasix 40mg  daily. GDMT was limited due to AKI and mild hypotension during that course. Echo 11/12 showed LVEF 60-65%, no RWMA, normal RV,  PASP36.2 mmHg, massive LAE, severe RAE, mod MR, mild late systolic prolapse of the middle scallop of the posterior leaflet of the mitral valve, aortic sclerosis.   He returned to ER 12/6 for more DOE, cough. CXR with enlarging right pleural effusion and congestin. S/p right thoracentesis 1.5L 12/5, transudative based on LDH/light's criteria. Cytology pending. CT repeat on 12/7 showed small left pleura effusion >R, progressive  airspace consolidation in the dependent aspect of the left lower lobe. New geographic ground-glass and airspace opacities peripherally in both upper lobes. CXR 12/8 showed Enlarged right and similar left pleural effusions. Suspect developing right-sided loculation  Cardiology had signed off 10/30/23, Dr Cristal Deer had reviewed Echo from 11/12, felt mitral regurgitation is not severe and not contributing current clinical symptoms, recommended diuresis to euvolemia. Cardiology is re-called 12/9 by hospitlaist, query need of RHC given recurrent pleural effusion.   He states he has been taking lasix 20mg  TID since 10/08/23 discharge, seems to urinate well. He has overall fatigue, low energy, chronic cough especially at night over the past 6 months.  He denied any leg swelling, rapid weight gain, fever, recent travel outside the country. He felt  his problems is not his heart. He reports childhood scarlet fever.   He feels unchanged clinically. He has urinated more. He denied any SOB at rest. He has not been OOB walking and wishes discharge today. Wife at bedside question if his PPM is contributing to pleural effusion.    Vital Signs .    Vitals:   11/02/23 1639 11/02/23 1732 11/02/23 1924 11/03/23 0357  BP: 112/79 122/70 123/78 120/70  Pulse: 78  79 61  Resp:   20 19  Temp: 97.8 F (36.6 C)  98.6 F (37 C) 97.8 F (36.6 C)  TempSrc:   Oral Oral  SpO2: 93%  94% 97%  Weight:    64.4 kg  Height:        Intake/Output Summary (Last 24 hours) at 11/03/2023 1321 Last data filed at 11/03/2023 1229 Gross per 24 hour  Intake 1080 ml  Output 1851 ml  Net -771 ml      11/03/2023    3:57 AM 11/02/2023    6:59 AM 11/01/2023    5:13 AM  Last 3 Weights  Weight (lbs) 141 lb 15.6 oz 146 lb 6.2 oz 143 lb 15.4 oz  Weight (kg) 64.4 kg 66.4 kg 65.3 kg      Telemetry/ECG    A fib, intermittently V paced - Personally Reviewed  Physical Exam .   GEN: No acute distress.   Neck: No JVD Cardiac: Irregular, grade III systolic murmur apex  Respiratory: Clear to auscultation bilaterally. On room air. Speaks full sentence  GI: Soft, nontender, non-distended  MS: No leg edema  Assessment & Plan .     Acute on chronic diastolic heart failure  Mitral regurgitation, moderate  Mitral  valve prolapse  Recurrent pleural effusion  - presented with DOE, recent hospitalization for diastolic CHF Nov 2024  - BNP 243, POA, was 229 10/05/23 - CXR showed enlarging R>L pleural effusions, increasing probable compressive atelectasis at both lung bases. Vascular congestion without overt pulmonary edema. POA - Echo 11/12 showed LVEF 60-65%, no RWMA, normal RV,  PASP36.2 mmHg, massive LAE, severe RAE, mod MR, mild late systolic prolapse of the middle scallop of the posterior leaflet of the mitral valve, aortic sclerosis.  - s/p right throacentesis  12/5, 1.5L, transudative based on LDH/light's criteria. Cytology showed reactive mesothelial cells, inflammation present  - CT repeat on 12/7 showed small left pleura effusion >R, progressive  airspace consolidation in the dependent aspect of the left lower lobe. New geographic ground-glass and airspace opacities peripherally in both upper lobes.  - CXR repeat 12/8 showed enlarged right and similar left pleural effusions. Suspect developing right-sided loculation - on PTA lasix 20mg  TID, has been diuresed with PO Lasix 20mg  BID here, lasix increased to 40mg  BID 12/9, weight is 146.83 >141.98 ib per records today, Net - , clinically he appears euvolemic, will continue current dose of Lasix  - repeat limited Echo 12/9 with LVEF 55-60%, no RWMA, normal RV, severe LAE, mod RAE, mod MR, aortic sclerosis, IVC normal  - maybe hypoalbuminemia contributing, no overwhelming evidence suggest infection etiology,  reasonable to consider autoimmune workup even though transudative effusion, defer to medical team  -Do not think there is benefit for RHC given his age and ultimate medical management option - message sent to Dr Graciela Husbands today regarding family concern of PPM contributing, he has appt with EP in Jan shall device adjustment needed   Tachycardia-bradycardia syndrome s/p PPM - last gen change 09/16/23, requested [employer] Rep to interrogate device yesterday, no episodes, normal device function   Permanent A fib - remains in A fib, rate controlled, continue PTA verapamil and eliquis      For questions or updates, please contact [facility] Please consult [email or web address] for contact info under     [facility] will sign off.   Medication Recommendations:  Eliquis 5 mg BID, lasix 40 mg BID Other recommendations (labs, testing, etc):  Needs BMET within 1 week Follow up as an outpatient:  Will schedule     Signed, Cyndi Bender, NP    Patient seen and examined.  Agree with  above documentation.  On exam, patient is alert and oriented, normal rate with irregular rhythm, tachycardia 6 systolic murmur, lungs CTAB, no LE edema or JVD.  Chest x-ray yesterday showed improvement in pleural effusions.  Continue p.o. Lasix.  Okay for discharge, needs BMET within 1 week.  Will arrange follow-up.  Little Ishikawa, MD

## 2023-11-04 ENCOUNTER — Telehealth: Payer: Self-pay

## 2023-11-04 ENCOUNTER — Telehealth: Payer: Self-pay | Admitting: Family Medicine

## 2023-11-04 LAB — ANA W/REFLEX IF POSITIVE: Anti Nuclear Antibody (ANA): NEGATIVE

## 2023-11-04 NOTE — Telephone Encounter (Signed)
If he is feeling okay at home the 20th should be fine-thank you so much

## 2023-11-04 NOTE — Telephone Encounter (Signed)
Spoke with pt and confirmed appt date and time on 11/13/23, pt aware that if he does not feel better to contact the office sooner. Pt expressed clear understanding.

## 2023-11-04 NOTE — Transitions of Care (Post Inpatient/ED Visit) (Signed)
11/04/2023  Name: Eric Lambert MRN: [id number] DOB: 02/27/1929  Today's TOC FU Call Status: Today's TOC FU Call Status:: Successful TOC FU Call Completed TOC FU Call Complete Date: 11/04/23 Patient's Name and Date of Birth confirmed.  Transition Care Management Follow-up Telephone Call Date of Discharge: 11/03/23 Discharge Facility: [facility]) Type of Discharge: Inpatient Admission Primary Inpatient Discharge Diagnosis:: "acute on chronic diastolic CHF" How have you been since you were released from the hospital?: Better (Pt voices he is doing ok-rested well -eating good-trying to adhere to diet restrictions-wgt today 136lbs, denies any swelling, gets a a little shortwinded with exertion-relieved with rest) Any questions or concerns?: No  Items Reviewed: Did you receive and understand the discharge instructions provided?: Yes Medications obtained,verified, and reconciled?: Yes (Medications Reviewed) (pt states he is pciking up Potassium med today from pharmacy) Any new allergies since your discharge?: No Dietary orders reviewed?: Yes Type of Diet Ordered:: low salt/heart healthy Do you have support at home?: Yes People in Home: spouse Name of Support/Comfort Primary Source: [name]  Medications Reviewed Today: Medications Reviewed Today     Reviewed by Charlyn Minerva, RN (Registered Nurse) on 11/04/23 at 1031  Med List Status: <None>   Medication Order Taking? Sig Documenting Provider Last Dose Status Informant  apixaban (ELIQUIS) 5 MG TABS tablet [id number] Yes Take 1 tablet by mouth twice daily Duke Salvia, MD Taking Active Self, Pharmacy Records  Cholecalciferol (VITAMIN D-3 PO) [id number] Yes Take 1 tablet by mouth daily. [provider] Taking Active Self, Pharmacy Records  Cyanocobalamin (VITAMIN B-12 PO) [id number] Yes Take 2 tablets by mouth daily. [provider] Taking Active Self, Pharmacy Records  famotidine (PEPCID) 40  MG tablet [id number] Yes Take 1 tablet (40 mg total) by mouth at bedtime. Unk Lightning, [place] Taking Active Self, Pharmacy Records  furosemide (LASIX) 40 MG tablet [id number] Yes Take 1 tablet (40 mg total) by mouth 2 (two) times daily. [name], MD Taking Active   Multiple Vitamins-Minerals [facility] EYE HEALTH FORMULA PO) [id number] Yes Take 1 tablet by mouth in the morning and at bedtime. [provider] Taking Active Self, Pharmacy Records  omeprazole (PRILOSEC) 40 MG capsule [id number] Yes TAKE 1 CAPSULE BY MOUTH TWICE DAILY 30-60 MINUTES BEFORE MEALS (BREAKFAST AND DINNER)  Patient taking differently: Take 40 mg by mouth in the morning.   Unk Lightning, [place] Taking Active Self, Pharmacy Records  potassium chloride SA (KLOR-CON M) 20 MEQ tablet [id number]  Take 2 tablets (40 mEq total) by mouth daily. [name], MD  Active   traZODone (DESYREL) 50 MG tablet [id number] No Take 0.5-1 tablets (25-50 mg total) by mouth at bedtime as needed for sleep.  Patient not taking: Reported on 11/04/2023   [name], MD Not Taking Active Self, Pharmacy Records           Med Note [name] Oct 28, 2023  4:35 PM) Patient feels like it didn't help him sleep last night  verapamil (CALAN-SR) 180 MG CR tablet [id number] Yes Take 1 tablet (180 mg total) by mouth every evening. [name], MD Taking Active             Home Care and Equipment/Supplies: Were Home Health Services Ordered?: No Any new equipment or medical supplies ordered?: No  Functional Questionnaire: Do you need assistance with bathing/showering or dressing?: No Do you need assistance with meal preparation?: No Do you need assistance with eating?: No Do you  have difficulty maintaining continence: No Do you need assistance with getting out of bed/getting out of a chair/moving?: No Do you have difficulty managing or taking your medications?: No  Follow up  appointments reviewed: PCP Follow-up appointment confirmed?: Yes Date of PCP follow-up appointment?: 11/13/23 Follow-up Provider: Dr. Durene Cal Specialist [facility] Follow-up appointment confirmed?: Yes Date of Specialist follow-up appointment?: 12/07/23 Follow-Up Specialty Provider:: Dr. Alben Spittle Do you need transportation to your follow-up appointment?: No (pt voices he drives himself to appts or wife able to take him) Do you understand care options if your condition(s) worsen?: Yes-patient verbalized understanding  SDOH Interventions Today    Flowsheet Row Most Recent Value  SDOH Interventions   Food Insecurity Interventions Intervention Not Indicated  Housing Interventions Intervention Not Indicated  Transportation Interventions Intervention Not Indicated  Utilities Interventions Intervention Not Indicated      TOC interventions discussed/reviewed: -Discussed/reviewed insurance/health plans benefits -Doctor visit discussed/reviewed -PCP -Doctor visits discussed/reviewed-Specialist -Provided Verbal Education: 30-day TOC program, nutrition, meds & their functions, symptom mgmt., fall/safety measures in the home -Disease mgmt. discussed/reviewed: HF mgmt- daily wgt monitoring-proper technique, s/s of abnormal wgt gain and when to alert provider  Patient declined weekly TOC calls-agreeable to be assigned to longitudinal nurse for less frequent calls- appt made with assigned RN.  [name], RN,BSN,CCM RN Care Manager Transitions of Care  [facility]/Population [facility]  Direct Phone: [phone] Toll Free: [phone] Fax: [phone]

## 2023-11-04 NOTE — Telephone Encounter (Signed)
Patient called stating he was d/c from hospital on 11/03/23 after being admitted on 10/28/23. Per discharge summary, pt needs f/u within a week. I was able to schedule pt with PCP for 11/13/23 @ 11:20 am. Patient was okay with this being a little over one week, but PCP please advise. If need for earlier HFU visit, please advise on where to work in.

## 2023-11-05 ENCOUNTER — Telehealth: Payer: Self-pay | Admitting: Internal Medicine

## 2023-11-05 LAB — MISC LABCORP TEST (SEND OUT): Labcorp test code: 9985

## 2023-11-05 NOTE — Telephone Encounter (Signed)
Transmission received.  Device function WNL.  Pt's question is-did the generator change lead to his recent hospitalizations.    Forwarding to Dr. Graciela Husbands for review.

## 2023-11-05 NOTE — Telephone Encounter (Signed)
Pt wants to know if his pacemaker change out could have caused the fluid build up which put him in the hospital. Wants to know if the settings are correct. Please advise

## 2023-11-05 NOTE — Telephone Encounter (Signed)
Spoke with pt and advised will forward his question to Dr Graciela Husbands but for now request that pt send a device transmission for review.  Pt states he will need assistance with this.  Pt advised will forward to device clinic for assistance.  Pt verbalizes understanding and agrees with current plan.

## 2023-11-05 NOTE — Telephone Encounter (Signed)
Spoke with pt and advised device function WNL and will await Dr [place] Fleming comments regarding Pleural Effusion being related to generator change out.  Pt verbalizes understanding and thanked Rn for the call.

## 2023-11-09 ENCOUNTER — Telehealth: Payer: Self-pay | Admitting: Family Medicine

## 2023-11-09 MED ORDER — POTASSIUM CHLORIDE CRYS ER 20 MEQ PO TBCR: 40.0000 meq | EXTENDED_RELEASE_TABLET | Freq: Every day | ORAL | 2 refills | Status: DC

## 2023-11-09 NOTE — Telephone Encounter (Signed)
Refill sent to requested pharmacy.

## 2023-11-09 NOTE — Telephone Encounter (Signed)
Prescription Request  11/09/2023  LOV: 10/13/2023  What is the name of the medication or equipment?  potassium chloride SA (KLOR-CON M) 20 MEQ tablet  Have you contacted your pharmacy to request a refill? Yes   Which pharmacy would you like this sent to?  [facility] [address] [place], [place] - [address] [place] [address] Phone: [phone] Fax: [phone]    Patient notified that their request is being sent to the clinical staff for review and that they should receive a response within 2 business days.   Please advise at Mobile [phone] (mobile)

## 2023-11-12 ENCOUNTER — Ambulatory Visit: Payer: Self-pay

## 2023-11-12 NOTE — Progress Notes (Signed)
I have reviewed and agree with note, evaluation, plan.   Jeffory Snelgrove, MD  

## 2023-11-12 NOTE — Patient Instructions (Signed)
Visit Information  Thank you for taking time to visit with me today. Please don't hesitate to contact me if I can be of assistance to you.   Following are the goals we discussed today:   Goals Addressed               This Visit's Progress     Heart Failure Managment (pt-stated)        Patient Goals/Self Care Activities: -Patient/Caregiver will take medications as prescribed   -Patient/Caregiver will attend all scheduled provider appointments -Patient/Caregiver will call provider office for new concerns or questions   -Weigh daily and record (notify MD with 3 lb weight gain over night or 5 lb in a week) -Follow CHF Action Plan -Adhere to low sodium diet    Patient doing okay. Has some questions about a research and pacemaker.  Weight 136 lbs.  Discussed heart failure and management.  He verbalized understanding. No RN CM concerns.         Our next appointment is by telephone on 12/17/23 at 1200 pm  Please call the care guide team at [phone] if you need to cancel or reschedule your appointment.   If you are experiencing a Mental Health or Behavioral Health Crisis or need someone to talk to, please call the Suicide and Crisis Lifeline: 988   Patient verbalizes understanding of instructions and care plan provided today and agrees to view in MyChart. Active MyChart status and patient understanding of how to access instructions and care plan via MyChart confirmed with patient.     The patient has been provided with contact information for the care management team and has been advised to call with any health related questions or concerns.   Bary Leriche, RN, MSN RN Care Manager [facility], Population Health Direct Dial: [phone]  Fax: [phone] Website: [email or web address]

## 2023-11-12 NOTE — Patient Outreach (Signed)
  Care Coordination   Initial Visit Note   11/12/2023 Name: Eric Lambert MRN: [id number] DOB: [birth year]  Eric Lambert is a 89 y.o. year old male who sees Durene Cal, Aldine Contes, MD for primary care. I spoke with  Eric Lambert by phone today.  What matters to the patients health and wellness today?  Pacemaker and setting. Follow up with Dr. Graciela Husbands to address.    Goals Addressed               This Visit's Progress     Heart Failure Managment (pt-stated)        Patient Goals/Self Care Activities: -Patient/Caregiver will take medications as prescribed   -Patient/Caregiver will attend all scheduled provider appointments -Patient/Caregiver will call provider office for new concerns or questions   -Weigh daily and record (notify MD with 3 lb weight gain over night or 5 lb in a week) -Follow CHF Action Plan -Adhere to low sodium diet    Patient doing okay. Has some questions about a research and pacemaker.  Weight 136 lbs.  Discussed heart failure and management.  He verbalized understanding. No RN CM concerns.         SDOH assessments and interventions completed:  Yes  SDOH Interventions Today    Flowsheet Row Most Recent Value  SDOH Interventions   Food Insecurity Interventions Intervention Not Indicated  Transportation Interventions Intervention Not Indicated  Utilities Interventions Intervention Not Indicated        Care Coordination Interventions:  Yes, provided   Follow up plan: Follow up call scheduled for January    Encounter Outcome:  Patient Visit Completed   Bary Leriche, RN, MSN RN Care Manager [facility], Population Health Direct Dial: [phone]  Fax: [phone] Website: [email or web address]

## 2023-11-13 ENCOUNTER — Ambulatory Visit (INDEPENDENT_AMBULATORY_CARE_PROVIDER_SITE_OTHER): Payer: Medicare HMO | Admitting: Family Medicine

## 2023-11-13 ENCOUNTER — Encounter: Payer: Self-pay | Admitting: Family Medicine

## 2023-11-13 ENCOUNTER — Ambulatory Visit
Admission: RE | Admit: 2023-11-13 | Discharge: 2023-11-13 | Disposition: A | Discharge status: Home / Self Care | Payer: Medicare HMO | Source: Ambulatory Visit | Attending: Family Medicine | Admitting: Family Medicine

## 2023-11-13 VITALS — BP 128/79 | HR 77 | Temp 98.2°F | Ht 70.0 in | Wt 143.6 lb

## 2023-11-13 DIAGNOSIS — J90 Pleural effusion, not elsewhere classified: Secondary | ICD-10-CM | POA: Diagnosis not present

## 2023-11-13 DIAGNOSIS — I10 Essential (primary) hypertension: Secondary | ICD-10-CM

## 2023-11-13 DIAGNOSIS — J439 Emphysema, unspecified: Secondary | ICD-10-CM | POA: Diagnosis not present

## 2023-11-13 DIAGNOSIS — I5033 Acute on chronic diastolic (congestive) heart failure: Secondary | ICD-10-CM | POA: Diagnosis not present

## 2023-11-13 DIAGNOSIS — I4821 Permanent atrial fibrillation: Secondary | ICD-10-CM

## 2023-11-13 DIAGNOSIS — J9811 Atelectasis: Secondary | ICD-10-CM | POA: Diagnosis not present

## 2023-11-13 LAB — CBC WITH DIFFERENTIAL/PLATELET
Basophils Absolute: 0 10*3/uL (ref 0.0–0.1)
Basophils Relative: 0.5 % (ref 0.0–3.0)
Eosinophils Absolute: 0.2 10*3/uL (ref 0.0–0.7)
Eosinophils Relative: 2.8 % (ref 0.0–5.0)
HCT: 40.7 % (ref 39.0–52.0)
Hemoglobin: 13.6 g/dL (ref 13.0–17.0)
Lymphocytes Relative: 23.9 % (ref 12.0–46.0)
Lymphs Abs: 1.4 10*3/uL (ref 0.7–4.0)
MCHC: 33.3 g/dL (ref 30.0–36.0)
MCV: 92 fL (ref 78.0–100.0)
Monocytes Absolute: 0.5 10*3/uL (ref 0.1–1.0)
Monocytes Relative: 8.9 % (ref 3.0–12.0)
Neutro Abs: 3.7 10*3/uL (ref 1.4–7.7)
Neutrophils Relative %: 63.9 % (ref 43.0–77.0)
Platelets: 166 10*3/uL (ref 150.0–400.0)
RBC: 4.43 Mil/uL (ref 4.22–5.81)
RDW: 15 % (ref 11.5–15.5)
WBC: 5.8 10*3/uL (ref 4.0–10.5)

## 2023-11-13 LAB — COMPREHENSIVE METABOLIC PANEL
ALT: 25 U/L (ref 0–53)
AST: 22 U/L (ref 0–37)
Albumin: 3.7 g/dL (ref 3.5–5.2)
Alkaline Phosphatase: 77 U/L (ref 39–117)
BUN: 38 mg/dL — ABNORMAL HIGH (ref 6–23)
CO2: 33 meq/L — ABNORMAL HIGH (ref 19–32)
Calcium: 9.1 mg/dL (ref 8.4–10.5)
Chloride: 103 meq/L (ref 96–112)
Creatinine, Ser: 1.13 mg/dL (ref 0.40–1.50)
GFR: 56.47 mL/min — ABNORMAL LOW (ref >60.00)
Glucose, Bld: 70 mg/dL (ref 70–99)
Potassium: 4.1 meq/L (ref 3.5–5.1)
Sodium: 143 meq/L (ref 135–145)
Total Bilirubin: 0.7 mg/dL (ref 0.2–1.2)
Total Protein: 6.5 g/dL (ref 6.0–8.3)

## 2023-11-13 NOTE — Assessment & Plan Note (Signed)
Blood pressure is well-controlled-discussed that diltiazem was decreased to 180 mg in the hospital and want him to go home and confirm that is the dose he is taking-maybe taking the 2040 mg.  Continue low-dose diltiazem this along with Lasix.  We discussed could take the second dose of Lasix 6 or 7 hours after the first dose instead as well as a part

## 2023-11-13 NOTE — Patient Instructions (Addendum)
Please go to [place]  central X-ray (updated 01/19/2020)- today or next week - located [address] across the street from [place] - in the basement - Hours: 8:30-5:00 PM M-F (with lunch from 12:30- 1 PM). You do NOT need an appointment.    Keep Dr. Graciela Husbands follow up on 12/21/23- we reached out to him  Please stop by lab before you go If you have mychart- we will send your results within 3 business days of [place] receiving them.  If you do not have mychart- we will call you about results within 5 business days of [place] receiving them.  *please also note that you will see labs on mychart as soon as they post. I will later go in and write notes on them- will say "notes from Dr. Durene Cal"   Recommended follow up: can push physical out 3-4 months- call me if you need me sooner- definitely let us know if any worsening shortness of breath- would be quicker to get x-ray on you than the average person

## 2023-11-13 NOTE — Progress Notes (Signed)
Phone [phone]   Subjective:  Eric Lambert is a 89 y.o. year old very pleasant male patient who presents for transitional care management and hospital follow up for presumed right-sided heart failure with lymphocytic pleural effusion. Patient was hospitalized from 10/28/2023 to 11/03/2023. A TCM phone call was completed on 11/04/2023. Medical complexity moderate  Patient was recently hospitalized in mid November with dyspnea on exertion, PND, bilateral pleural effusions on CT right greater than left-patient was placed on diuretics with ejection fraction of 60 to 65% with moderate mitral valve regurgitation and diuresed 1.8 L.  Had mild renal insufficiency with this.  He seemed to be doing reasonably well but then developed worsening dyspnea on exertion and nonproductive cough despite continued diuretics and return to the emergency department.  Recap per discharge summary "12/5 IR consulted-1.5 L thoracentesis performed 12/6 cardiology consulted-felt not heart failure related--- pulmonology consulted workup performed US RUQ = hepatic steatosis 12/7 CT chest small effusions right greater than sign left progressive consolidation dependent left lung airspace opacities peripherally both upper lobes 12/8 cxr--loculation of fluid on R side 12/9 rpt ECHO EF 55-60%--mod MR" Of note repeat chest x-ray on 11/02/2023 showed decrease in pleural effusion though residual still noted  Patient did have elevated CRP of 5.2 but sedimentation rate was not elevated, ANA not elevated.  Total protein and 24-hour urine was only 22 mg which rules out nephrotic syndrome as the cause.  Cardiology was reconsulted for their opinion on right heart cath but they did not pursue this or recommend this.  Ultimately Cardizem was decreased to 180 mg so that Lasix could be increased to twice daily 40 mg-they recommended close outpatient follow-up of renal status as he would develop AKI with aggressive diuresis.  Other  chronic conditions were maintained -Maintained on Eliquis for atrial fibrillation along with diltiazem even though dose was decreased as above -On trazodone as needed for sleep  Today he reports feeling better overall. Weight has been stable around 135. Still some cough. Appetite is down and taste is off   See problem oriented charting as well  Past Medical History-  Patient Active Problem List   Diagnosis Date Noted   Bilateral pleural effusion 10/30/2023    Priority: High   Acute on chronic diastolic congestive heart failure (HCC) 10/05/2023    Priority: High   Pleural effusion 10/05/2023    Priority: High   PPM-[employer] 02/07/2009    Priority: High   Permanent atrial fibrillation (HCC) 10/21/2007    Priority: High   Chronic kidney disease, stage 3b (HCC) 10/07/2023    Priority: Medium    Hyperglycemia 11/07/2022    Priority: Medium    Esophageal obstruction due to food impaction     Priority: Medium    Aortic atherosclerosis (HCC) 08/13/2020    Priority: Medium    BPPV (benign paroxysmal positional vertigo) 12/10/2017    Priority: Medium    Bradycardia 02/02/2013    Priority: Medium    Thrombocytopenia (HCC) 08/04/2011    Priority: Medium    Obstructive sleep apnea 10/21/2007    Priority: Medium    Essential hypertension 10/21/2007    Priority: Medium    GERD 10/21/2007    Priority: Medium    BPH associated with nocturia 10/21/2007    Priority: Medium    Erectile dysfunction 05/28/2016    Priority: Low   Macular degeneration 05/28/2016    Priority: Low   Right hip pain 08/04/2013    Priority: Low   Secondary cardiomyopathy (HCC)  02/07/2009    Priority: Low   Osteopenia 10/22/2007    Priority: Low   SUBACUTE BACTERIAL ENDOCARDITIS 10/21/2007    Priority: Low   S/P total left hip arthroplasty 01/07/2022    Priority: 1.   Degenerative disc disease, lumbar 07/18/2019    Priority: 1.   Chronic low back pain 06/09/2018    Priority: 1.   Right knee  pain 06/02/2017    Priority: 1.   Hyperbilirubinemia 10/30/2023   Recurrent right pleural effusion 10/28/2023   NSVT (nonsustained ventricular tachycardia) (HCC) 04/30/2023    Medications- reviewed and updated  A medical reconciliation was performed comparing current medicines to hospital discharge medications. Current Outpatient Medications  Medication Sig Dispense Refill   apixaban (ELIQUIS) 5 MG TABS tablet Take 1 tablet by mouth twice daily 180 tablet 1   Cholecalciferol (VITAMIN D-3 PO) Take 1 tablet by mouth daily.     Cyanocobalamin (VITAMIN B-12 PO) Take 2 tablets by mouth daily.     furosemide (LASIX) 40 MG tablet Take 1 tablet (40 mg total) by mouth 2 (two) times daily. 60 tablet 1   Multiple Vitamins-Minerals ([place]UV[place] EYE HEALTH FORMULA PO) Take 1 tablet by mouth in the morning and at bedtime.     potassium chloride SA (KLOR-CON M) 20 MEQ tablet Take 2 tablets (40 mEq total) by mouth daily. 10 tablet 2   verapamil (CALAN-SR) 180 MG CR tablet Take 1 tablet (180 mg total) by mouth every evening. 30 tablet 2   famotidine (PEPCID) 40 MG tablet Take 1 tablet (40 mg total) by mouth at bedtime. (Patient not taking: Reported on 11/13/2023) 30 tablet 1   omeprazole (PRILOSEC) 40 MG capsule TAKE 1 CAPSULE BY MOUTH TWICE DAILY 30-60 MINUTES BEFORE MEALS (BREAKFAST AND DINNER) (Patient not taking: Reported on 11/13/2023) 180 capsule 1   traZODone (DESYREL) 50 MG tablet Take 0.5-1 tablets (25-50 mg total) by mouth at bedtime as needed for sleep. (Patient not taking: Reported on 11/13/2023) 30 tablet 3   No current facility-administered medications for this visit.   Objective  Objective:  BP 128/79   Pulse 77   Temp 98.2 F (36.8 C)   Ht 5\' 10"  (1.778 m)   Wt 143 lb 9.6 oz (65.1 kg)   SpO2 97%   BMI 20.60 kg/m  Gen: NAD, resting comfortably CV: RRR 3 out of 6 murmur at left lower sternal border and noted as 4 out of 6 over left chest  lungs: CTAB no crackles, wheeze, rhonchi-no  obvious decreased breath sounds at bases Abdomen: soft/nontender/nondistended/normal bowel sounds. No rebound or guarding.  Ext: no edema Skin: warm, dry  Patient's wife with him today and is very supportive   Assessment and Plan:   # TCM/hospital follow-up Assessment & Plan Pleural effusion Patient with recurrent pleural effusion that seem to improve with diuresis in November hospitalization but worsened while outpatient despite continued outpatient diuresis requiring repeat hospitalization and thoracentesis.  Noted to be transudative.  There were some inflammatory cells and CRP was mildly high but ESR and ANA were normal.  We briefly discussed possible rheumatology consult but opted to hold off for now - Patient brings in interesting question after his generator change [email or web address] this to Dr. Graciela Husbands for his opinion.  We also stressed the importance of keeping follow-up with Dr. Graciela Husbands in [place] agrees -Could be related to CHF with transudative effusion-continue increased diuresis with Lasix 40 mg twice daily as long as renal function holding steady-updating that today - Also checking repeat  chest x-ray to make sure no worsening Acute on chronic diastolic congestive heart failure (HCC) Likely/suspected right heart failure but right heart catheterization has not been recommended per cardiology.  Weights have been stable at home on Lasix 40 mg twice daily-continue current medication.  Appears euvolemic but we discussed his main symptom has been worsening shortness of breath so I want him to watch this very closely as he has not had significant edema or weight gain before hospitalization -She does not like the potassium was tried to tolerate it.  Feels like foods do not taste as appetizing on the Lasix with his dry mouth Essential hypertension Blood pressure is well-controlled-discussed that diltiazem was decreased to 180 mg in the hospital  and want him to go home and confirm that is the dose he is taking-maybe taking the 2040 mg.  Continue low-dose diltiazem this along with Lasix.  We discussed could take the second dose of Lasix 6 or 7 hours after the first dose instead as well as a part Permanent atrial fibrillation (HCC) Atrial fibrillation noted.  Appropriately anticoagulated with Eliquis.  Appears rate controlled with diltiazem.  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  Recommended follow up: Discussed 3 to 58-month physical-happy to see him sooner if needed-originally had and plan to have physical in January Future Appointments  Date Time Provider Department Center  11/23/2023  1:20 PM [name], MD [facility]  12/07/2023  8:25 AM Tereso Newcomer T, PA-C [facility]  12/17/2023 12:00 PM Fleeta Emmer, RN [facility] None  12/18/2023  7:00 AM [facility] DEVICE REMOTES [facility]  12/21/2023  2:45 PM Duke Salvia, MD [facility]  12/29/2023 11:15 AM Hunsucker, Lesia Sago, MD [facility] None  03/18/2024  7:00 AM [facility] DEVICE REMOTES [facility]  03/28/2024 11:00 AM [facility] PHLEBOTOMIST [facility] None  03/28/2024 11:40 AM Ladene Artist, MD [facility] None  06/17/2024  7:00 AM [facility] DEVICE REMOTES [facility]  09/16/2024  7:00 AM [facility] DEVICE REMOTES [facility]  12/16/2024  7:00 AM [facility] DEVICE REMOTES [facility]  03/17/2025  7:00 AM [facility] DEVICE REMOTES [facility]  06/16/2025  7:00 AM [facility] DEVICE REMOTES [facility]  09/15/2025  7:00 AM [facility] DEVICE REMOTES [facility]    Lab/Order associations:   ICD-10-CM   1. Pleural effusion  J90 Comprehensive metabolic panel    CBC with Differential/Platelet    DG Chest 2 View    2. Acute on chronic diastolic congestive heart failure (HCC)  I50.33     3. Essential hypertension  I10     4. Permanent  atrial fibrillation (HCC)  I48.21       No orders of the defined types were placed in this encounter.   Return precautions advised.  Tana Conch, MD

## 2023-11-13 NOTE — Assessment & Plan Note (Signed)
Likely/suspected right heart failure but right heart catheterization has not been recommended per cardiology.  Weights have been stable at home on Lasix 40 mg twice daily-continue current medication.  Appears euvolemic but we discussed his main symptom has been worsening shortness of breath so I want him to watch this very closely as he has not had significant edema or weight gain before hospitalization -She does not like the potassium was tried to tolerate it.  Feels like foods do not taste as appetizing on the Lasix with his dry mouth

## 2023-11-13 NOTE — Telephone Encounter (Signed)
[email or web address]  This is the article patient was referencing. Still interested in Dr. [place] Fleming opinion on this. He knows its a long shot but he just doesn't fully understand why these recent effusions have occurred- and has had 2 hospitalizations since generator change

## 2023-11-13 NOTE — Assessment & Plan Note (Signed)
Patient with recurrent pleural effusion that seem to improve with diuresis in November hospitalization but worsened while outpatient despite continued outpatient diuresis requiring repeat hospitalization and thoracentesis.  Noted to be transudative.  There were some inflammatory cells and CRP was mildly high but ESR and ANA were normal.  We briefly discussed possible rheumatology consult but opted to hold off for now - Patient brings in interesting question after his generator change [email or web address] this to Dr. Graciela Husbands for his opinion.  We also stressed the importance of keeping follow-up with Dr. Graciela Husbands in [place] agrees -Could be related to CHF with transudative effusion-continue increased diuresis with Lasix 40 mg twice daily as long as renal function holding steady-updating that today - Also checking repeat chest x-ray to make sure no worsening

## 2023-11-13 NOTE — Assessment & Plan Note (Signed)
Atrial fibrillation noted.  Appropriately anticoagulated with Eliquis.  Appears rate controlled with diltiazem.  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

## 2023-11-16 ENCOUNTER — Other Ambulatory Visit: Payer: Self-pay

## 2023-11-16 DIAGNOSIS — R748 Abnormal levels of other serum enzymes: Secondary | ICD-10-CM

## 2023-11-19 ENCOUNTER — Telehealth: Payer: Self-pay | Admitting: Family Medicine

## 2023-11-19 NOTE — Telephone Encounter (Signed)
Patient called requesting to have potassium chloride quantity match his furosemide quantity. He'd like for a 30 day supply w/1 refill sent to pharmacy so he wont have to make as many trips. Please Advise.

## 2023-11-23 ENCOUNTER — Encounter: Payer: Medicare HMO | Admitting: Family Medicine

## 2023-11-23 ENCOUNTER — Other Ambulatory Visit: Payer: Self-pay | Admitting: Family Medicine

## 2023-11-23 MED ORDER — POTASSIUM CHLORIDE CRYS ER 20 MEQ PO TBCR: 40.0000 meq | EXTENDED_RELEASE_TABLET | Freq: Every day | ORAL | 2 refills | Status: DC

## 2023-11-23 NOTE — Telephone Encounter (Signed)
sent 

## 2023-11-26 ENCOUNTER — Telehealth: Payer: Self-pay | Admitting: Family Medicine

## 2023-11-26 NOTE — Telephone Encounter (Signed)
 If his weight is up any more tomorrow (please call and check) lets have him take 2 tablets in the morning so 80 mg with 40 mg later in the day on repeat for 3 days to try to get the weight back down/fluid down-make sure his labs on the sixth include a BMP or CMP in that case

## 2023-11-26 NOTE — Telephone Encounter (Signed)
 See below

## 2023-11-26 NOTE — Telephone Encounter (Signed)
 Is he still taking the lasix/furosemide twice a day at 40 mg? Just want to confirm before any adjustments

## 2023-11-26 NOTE — Telephone Encounter (Signed)
 Pt called ans stated he wanted Dr Durene Cal to know that he weighed himself this morning and is up 2 to 3 lbs. Please advise.

## 2023-11-26 NOTE — Telephone Encounter (Signed)
 Called and spoke with pt and yes, he is taking Lasix 40mg  bid.

## 2023-11-27 NOTE — Telephone Encounter (Signed)
 Left message to return call to our office at their convenience.

## 2023-11-30 ENCOUNTER — Other Ambulatory Visit (INDEPENDENT_AMBULATORY_CARE_PROVIDER_SITE_OTHER): Payer: Medicare HMO

## 2023-11-30 DIAGNOSIS — R748 Abnormal levels of other serum enzymes: Secondary | ICD-10-CM | POA: Diagnosis not present

## 2023-11-30 LAB — COMPREHENSIVE METABOLIC PANEL
ALT: 14 U/L (ref 0–53)
AST: 17 U/L (ref 0–37)
Albumin: 3.8 g/dL (ref 3.5–5.2)
Alkaline Phosphatase: 74 U/L (ref 39–117)
BUN: 31 mg/dL — ABNORMAL HIGH (ref 6–23)
CO2: 30 meq/L (ref 19–32)
Calcium: 9.1 mg/dL (ref 8.4–10.5)
Chloride: 104 meq/L (ref 96–112)
Creatinine, Ser: 1.14 mg/dL (ref 0.40–1.50)
GFR: 55.86 mL/min — ABNORMAL LOW (ref >60.00)
Glucose, Bld: 85 mg/dL (ref 70–99)
Potassium: 4 meq/L (ref 3.5–5.1)
Sodium: 141 meq/L (ref 135–145)
Total Bilirubin: 0.8 mg/dL (ref 0.2–1.2)
Total Protein: 6.5 g/dL (ref 6.0–8.3)

## 2023-12-07 ENCOUNTER — Ambulatory Visit: Payer: Medicare HMO | Admitting: Physician Assistant

## 2023-12-07 DIAGNOSIS — H52203 Unspecified astigmatism, bilateral: Secondary | ICD-10-CM | POA: Diagnosis not present

## 2023-12-07 DIAGNOSIS — Z961 Presence of intraocular lens: Secondary | ICD-10-CM | POA: Diagnosis not present

## 2023-12-07 DIAGNOSIS — H353112 Nonexudative age-related macular degeneration, right eye, intermediate dry stage: Secondary | ICD-10-CM | POA: Diagnosis not present

## 2023-12-17 ENCOUNTER — Ambulatory Visit: Payer: Self-pay

## 2023-12-17 NOTE — Patient Instructions (Signed)
Visit Information  Thank you for taking time to visit with me today. Please don't hesitate to contact me if I can be of assistance to you.   Following are the goals we discussed today:   Goals Addressed               This Visit's Progress     Heart Failure Managment (pt-stated)        Patient Goals/Self Care Activities: -Patient/Caregiver will take medications as prescribed   -Patient/Caregiver will attend all scheduled provider appointments -Patient/Caregiver will call provider office for new concerns or questions   -Weigh daily and record (notify MD with 3 lb weight gain over night or 5 lb in a week) -Follow CHF Action Plan -Adhere to low sodium diet    Patient doing okay. Has some questions about a research and pacemaker will discuss with Dr. Graciela Husbands.  Weight 136 lbs.  Reiterated heart failure and management.  He verbalized understanding. No RN CM concerns.         Our next appointment is by telephone on 01/14/24 at 1200 pm  Please call the care guide team at [phone] if you need to cancel or reschedule your appointment.   If you are experiencing a Mental Health or Behavioral Health Crisis or need someone to talk to, please call the Suicide and Crisis Lifeline: 988   Patient verbalizes understanding of instructions and care plan provided today and agrees to view in MyChart. Active MyChart status and patient understanding of how to access instructions and care plan via MyChart confirmed with patient.     The patient has been provided with contact information for the care management team and has been advised to call with any health related questions or concerns.   Bary Leriche RN, MSN [facility], [facility] RN Care Manager Direct Dial: [phone]  Fax: [phone] Website: [email or web address]

## 2023-12-17 NOTE — Patient Outreach (Signed)
  Care Coordination   Follow Up Visit Note   12/17/2023 Name: Eric Lambert MRN: [id number] DOB: January 26, 1929  Eric Lambert is a 89 y.o. year old male who sees Eric Lambert, Eric Contes, MD for primary care. I spoke with  Eric Lambert by phone today.  What matters to the patients health and wellness today?  Heart failure management    Goals Addressed               This Visit's Progress     Heart Failure Managment (pt-stated)        Patient Goals/Self Care Activities: -Patient/Caregiver will take medications as prescribed   -Patient/Caregiver will attend all scheduled provider appointments -Patient/Caregiver will call provider office for new concerns or questions   -Weigh daily and record (notify MD with 3 lb weight gain over night or 5 lb in a week) -Follow CHF Action Plan -Adhere to low sodium diet    Patient doing okay. Has some questions about a research and pacemaker will discuss with Eric Lambert.  Weight 136 lbs.  Reiterated heart failure and management.  He verbalized understanding. No RN CM concerns.         SDOH assessments and interventions completed:  Yes     Care Coordination Interventions:  Yes, provided   Follow up plan: Follow up call scheduled for February    Encounter Outcome:  Patient Visit Completed   Eric Leriche RN, MSN [facility], [facility] RN Care Manager Direct Dial: [phone]  Fax: [phone] Website: [email or web address]

## 2023-12-18 ENCOUNTER — Ambulatory Visit (INDEPENDENT_AMBULATORY_CARE_PROVIDER_SITE_OTHER): Payer: Medicare HMO

## 2023-12-18 DIAGNOSIS — R001 Bradycardia, unspecified: Secondary | ICD-10-CM

## 2023-12-18 LAB — CUP PACEART REMOTE DEVICE CHECK
Battery Remaining Longevity: 120 mo
Battery Remaining Percentage: 100 %
Brady Statistic RV Percent Paced: 40 %
Date Time Interrogation Session: 20250124021100
Implantable Lead Connection Status: 753985
Implantable Lead Connection Status: 753985
Implantable Lead Implant Date: 19960809
Implantable Lead Implant Date: 19960809
Implantable Lead Location: 753859
Implantable Lead Location: 753860
Implantable Lead Model: 4285
Implantable Lead Serial Number: 209144
Implantable Pulse Generator Implant Date: 20241023
Lead Channel Impedance Value: 790 Ohm
Lead Channel Pacing Threshold Amplitude: 1.1 V
Lead Channel Pacing Threshold Pulse Width: 0.4 ms
Lead Channel Setting Pacing Amplitude: 2.5 V
Lead Channel Setting Pacing Pulse Width: 0.4 ms
Lead Channel Setting Sensing Sensitivity: 2.5 mV
Pulse Gen Serial Number: 924391
Zone Setting Status: 755011

## 2023-12-21 ENCOUNTER — Encounter: Payer: Self-pay | Admitting: Internal Medicine

## 2023-12-21 ENCOUNTER — Ambulatory Visit: Payer: Medicare HMO | Attending: Internal Medicine | Admitting: Internal Medicine

## 2023-12-21 VITALS — BP 123/75 | HR 87 | Ht 70.0 in | Wt 142.8 lb

## 2023-12-21 DIAGNOSIS — Z950 Presence of cardiac pacemaker: Secondary | ICD-10-CM | POA: Diagnosis not present

## 2023-12-21 DIAGNOSIS — I4821 Permanent atrial fibrillation: Secondary | ICD-10-CM

## 2023-12-21 DIAGNOSIS — J90 Pleural effusion, not elsewhere classified: Secondary | ICD-10-CM | POA: Diagnosis not present

## 2023-12-21 DIAGNOSIS — R0602 Shortness of breath: Secondary | ICD-10-CM

## 2023-12-21 DIAGNOSIS — Z79899 Other long term (current) drug therapy: Secondary | ICD-10-CM | POA: Diagnosis not present

## 2023-12-21 DIAGNOSIS — R001 Bradycardia, unspecified: Secondary | ICD-10-CM | POA: Diagnosis not present

## 2023-12-21 MED ORDER — FUROSEMIDE 40 MG PO TABS: 60.0000 mg | ORAL_TABLET | Freq: Every morning | ORAL | Status: DC

## 2023-12-21 MED ORDER — DIGOXIN 125 MCG PO TABS: 0.1250 mg | ORAL_TABLET | Freq: Every day | ORAL | 3 refills | Status: DC

## 2023-12-21 NOTE — Patient Instructions (Addendum)
Medication Instructions:  Your physician has recommended you make the following change in your medication:   ** Change Furosemide 40mg  to 2 tablets each morning (80mg ) x 5 days then decrease to 1.5 tablets (60mg ) by mouth every morning.  ** Begin Digoxin 0.125mg  -1 tablet by mouth daily     *If you need a refill on your cardiac medications before your next appointment, please call your pharmacy*   Lab Work: BMET and Dig level in 2 weeks   If you have labs (blood work) drawn today and your tests are completely normal, you will receive your results only by: MyChart Message (if you have MyChart) OR A paper copy in the mail If you have any lab test that is abnormal or we need to change your treatment, we will call you to review the results.   Testing/Procedures: [facility] - [address] [place]. [place] A chest x-ray takes a picture of the organs and structures inside the chest, including the heart, lungs, and blood vessels. This test can show several things, including, whether the heart is enlarges; whether fluid is building up in the lungs; and whether pacemaker / defibrillator leads are still in place.    Follow-Up: At [facility], you and your health needs are our priority.  As part of our continuing mission to provide you with exceptional heart care, we have created designated Provider Care Teams.  These Care Teams include your primary Cardiologist (physician) and Advanced Practice Providers (APPs -  Physician Assistants and Nurse Practitioners) who all work together to provide you with the care you need, when you need it.  We recommend signing up for the patient portal called "MyChart".  Sign up information is provided on this After Visit Summary.  MyChart is used to connect with patients for Virtual Visits (Telemedicine).  Patients are able to view lab/test results, encounter notes, upcoming appointments, etc.  Non-urgent messages can be sent to your provider as  well.   To learn more about what you can do with MyChart, go to [email or web address].    Your next appointment:   4 week telephone visit

## 2023-12-21 NOTE — Progress Notes (Signed)
HPI  Eric Lambert is a 89 y.o. 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  Device replacement 10/24  2 interval hospitalizations for recurrent dyspnea associated with pleural effusions for which she underwent thoracentesis both 11/24 and 12/24.  Transudate.  Cytology pending airspace consolidation noted.  Echocardiogram showed normal LV function moderate MR.  Cardiology consultation not feel the effusions were cardiac in nature.  Right heart cath was considered but not pursued   Continues to struggle with dyspnea although much improved.  Some anorexia.  Some difficulty taking a deep breath.  Appetite clearly worse since leaving the hospital        His wife is sick with a pulm mass the cause of which is still being sought-this turned out to be an autoimmune process.  She is now has a low-grade leukemia. He has concerns about the diagnosis and the care , Date Cr K Hgb  1/19 0.9  14.4   8/20 1.26  14.5  3/22 0.97 4.2 14.6  2/23 1.12 4.1 13.9  12/23 1.31 4.9 12.9      DATE TEST EF   4/22 Echo   55-60 % BAE                Last echo was 2008.   Past Medical History:  Diagnosis Date   Allergy    Arthritis    Atrial fibrillation -permanent    BPH (benign prostatic hyperplasia)    Cancer (HCC)    HX OF SKIN CANCER    CHF (congestive heart failure) (HCC)    resolved after pacemaker - tachycardia induced   Complete heart block (HCC)    Dyspnea    mild   ED (erectile dysfunction)    GERD (gastroesophageal reflux disease)    GLUCOSE INTOLERANCE 10/22/2007   no recent issues   Heart murmur    OSA (obstructive sleep apnea)    NO CPAP    Pacemaker BSX    dual   Pneumonia    HX OF SEVERAL TIMES AS A CHILD    PONV (postoperative nausea and vomiting)    at age 32    Presence of  permanent cardiac pacemaker    SUBACUTE BACTERIAL ENDOCARDITIS 1970s    Past Surgical History:  Procedure Laterality Date   ESOPHAGOGASTRODUODENOSCOPY (EGD) WITH PROPOFOL N/A 01/07/2022   Procedure: ESOPHAGOGASTRODUODENOSCOPY (EGD) WITH PROPOFOL;  Surgeon: Beverley Fiedler, MD;  Location: [facility];  Service: Gastroenterology;  Laterality: N/A;   IMPACTION REMOVAL  01/07/2022   Procedure: IMPACTION REMOVAL;  Surgeon: Beverley Fiedler, MD;  Location: [facility];  Service: Gastroenterology;;   INGUINAL HERNIA REPAIR Bilateral 04/08/2021   Procedure: LAPAROSCOPIC BILATERAL INGUINAL HERNIA REPAIR WITH MESH;  Surgeon: Kinsinger, De Blanch, MD;  Location: [facility];  Service: General;  Laterality: Bilateral;   INSERT / REPLACE / REMOVE PACEMAKER     PACEMAKER GENERATOR CHANGE N/A 05/07/2012   Procedure: PACEMAKER GENERATOR CHANGE;  Surgeon: Duke Salvia, MD;  [facility];  Service: Cardiovascular;  Laterality: N/A;   PACEMAKER PLACEMENT     PARTIAL HIP ARTHROPLASTY     2008   PPM GENERATOR CHANGEOUT N/A 09/16/2023   Procedure: PPM GENERATOR CHANGEOUT;  Surgeon: Duke Salvia, MD;  Location: [facility] INVASIVE CV LAB;  Service: Cardiovascular;  Laterality: N/A;   TOTAL HIP ARTHROPLASTY Left 01/07/2022  Procedure: TOTAL HIP ARTHROPLASTY ANTERIOR APPROACH;  Surgeon: Sheral Apley, MD;  Location: [facility];  Service: Orthopedics;  Laterality: Left;    Current Outpatient Medications  Medication Sig Dispense Refill   apixaban (ELIQUIS) 5 MG TABS tablet Take 1 tablet by mouth twice daily 180 tablet 1   Cholecalciferol (VITAMIN D-3 PO) Take 1 tablet by mouth daily.     Cyanocobalamin (VITAMIN B-12 PO) Take 2 tablets by mouth daily.     furosemide (LASIX) 40 MG tablet Take 1 tablet (40 mg total) by mouth 2 (two) times daily. 60 tablet 1   Multiple Vitamins-Minerals (OCUVITE EYE HEALTH FORMULA PO) Take 1 tablet by mouth in the morning and at bedtime.     potassium chloride SA (KLOR-CON M) 20 MEQ  tablet Take 2 tablets (40 mEq total) by mouth daily. 60 tablet 2   verapamil (CALAN-SR) 180 MG CR tablet Take 1 tablet (180 mg total) by mouth every evening. 30 tablet 2   No current facility-administered medications for this visit.    No Known Allergies  Review of Systems negative except from HPI and PMH  Physical Exam BP 123/75   Pulse 87   Ht 5\' 10"  (1.778 m)   Wt 142 lb 12.8 oz (64.8 kg)   SpO2 96%   BMI 20.49 kg/m  Well developed and well nourished in no acute distress HENT normal Neck supple with JVP-flat Clear Device pocket well healed; without hematoma or erythema.  There is no tethering  Regular rate and rhythm, no  gallop 3/6/ murmur>>> back Abd-soft with active BS No Clubbing cyanosis  edema Skin-warm and dry A & Oriented  Grossly normal sensory and motor function  ECG afib @ 87 -/11/38  Device function is normal. Programming changes   See Paceart for details      Assessment and  Plan  Atrial fibrillation  Permanent    Pacemaker-[employer]    HFpEF  Bradycardia  Hypertension   Renal function grade 3   Remote endocarditis  Recurrent hospitalizations for transudative effusions most likely cardiac despite the lack of edema.  I suspect that his decreased appetite may be related to this although he apparently had flat neck veins.  This obviously raises the specter of multiple other causes.  But he did improve significant with diuresis.  Does not note he diuresis well with the 40 mg twice daily so increase it to 80 every morning  HR 10-15% > than 100 so will add dig as BP at home may be limiting so we will add digoxin.  Will check level in a couple of weeks.  Will follow-up with chest x-ray to see whether the effusion has reaccumulated.

## 2023-12-22 ENCOUNTER — Telehealth: Payer: Self-pay | Admitting: Internal Medicine

## 2023-12-22 ENCOUNTER — Encounter: Payer: Self-pay | Admitting: Internal Medicine

## 2023-12-22 ENCOUNTER — Ambulatory Visit
Admission: RE | Admit: 2023-12-22 | Discharge: 2023-12-22 | Disposition: A | Discharge status: Home / Self Care | Payer: Medicare HMO | Source: Ambulatory Visit | Attending: Internal Medicine | Admitting: Internal Medicine

## 2023-12-22 DIAGNOSIS — J984 Other disorders of lung: Secondary | ICD-10-CM | POA: Diagnosis not present

## 2023-12-22 DIAGNOSIS — R0602 Shortness of breath: Secondary | ICD-10-CM

## 2023-12-22 DIAGNOSIS — J90 Pleural effusion, not elsewhere classified: Secondary | ICD-10-CM | POA: Diagnosis not present

## 2023-12-22 NOTE — Telephone Encounter (Signed)
Attempted phone call to pt.  Per [facility] OK to leave detailed voicemail message.  Left voicemail message advising, per Dr Graciela Husbands he is to take Furosemide 40mg  - 2 tablets by mouth each morning x 5 days then decrease to 1.5 tablets (60mg ) every morning there after.  Pt advised to call or send a MyChart message with any further questions.

## 2023-12-22 NOTE — Telephone Encounter (Signed)
Pt c/o medication issue:  1. Name of Medication:   furosemide (LASIX) 40 MG tablet   2. How are you currently taking this medication (dosage and times per day)?   3. Are you having a reaction (difficulty breathing--STAT)?   4. What is your medication issue?   Patient stated he recently had a medication change and wants a call back to confirm what his total dosage should be.  Patient noted he was prescribed two tables in the morning for five days and then reduce to 1.5 tablets in the morning.

## 2023-12-23 NOTE — Telephone Encounter (Signed)
Spoke with pt and advised per Dr Odessa Fleming note pt WAS taking Furosemide 40mg  1 tablet bid daily.  Apologized to pt that RN was misinformed regarding this daily dose verses every other day.   Reiterated pt's Furosemide dose per Dr Graciela Husbands and advised we will await CXR results as well as lab work next week.  Pt verbalizes understanding and agrees with current plan.

## 2023-12-24 ENCOUNTER — Encounter: Payer: Self-pay | Admitting: Internal Medicine

## 2023-12-25 DIAGNOSIS — H539 Unspecified visual disturbance: Secondary | ICD-10-CM | POA: Diagnosis not present

## 2023-12-27 LAB — CUP PACEART INCLINIC DEVICE CHECK
Date Time Interrogation Session: 20250127000000
Implantable Lead Connection Status: 753985
Implantable Lead Connection Status: 753985
Implantable Lead Implant Date: 19960809
Implantable Lead Implant Date: 19960809
Implantable Lead Location: 753859
Implantable Lead Location: 753860
Implantable Lead Model: 4285
Implantable Lead Serial Number: 209144
Implantable Pulse Generator Implant Date: 20241023
Lead Channel Setting Pacing Amplitude: 2.5 V
Lead Channel Setting Pacing Pulse Width: 0.4 ms
Lead Channel Setting Sensing Sensitivity: 2.5 mV
Pulse Gen Serial Number: 924391
Zone Setting Status: 755011

## 2023-12-29 ENCOUNTER — Ambulatory Visit: Payer: Medicare HMO | Admitting: Pulmonary Disease

## 2023-12-29 ENCOUNTER — Encounter: Payer: Self-pay | Admitting: Pulmonary Disease

## 2023-12-29 VITALS — BP 118/73 | HR 78 | Ht 70.0 in | Wt 143.8 lb

## 2023-12-29 DIAGNOSIS — J90 Pleural effusion, not elsewhere classified: Secondary | ICD-10-CM

## 2023-12-29 DIAGNOSIS — R0609 Other forms of dyspnea: Secondary | ICD-10-CM

## 2023-12-29 NOTE — Patient Instructions (Signed)
 Nice to meet you  I think the shortness of breath is large related to the heart  In the past the chest x-rays have shown fluid in the lung and around the lung backed up from the heart  The 2 most recent chest x-ray showed marked improvement in this.  I encourage you to continue to eat a healthy diet, avoid salt or sodium, take Lasix  as prescribed.  In effort to make sure were not missing something else, ordered pulmonary function test.  Please schedule this next available at your convenience.  Return to clinic with Dr. Annella in 2 months after pulmonary function test to review results

## 2023-12-29 NOTE — Progress Notes (Signed)
 @Patient  ID: Eric Lambert, male    DOB: 03/28/1929, 89 y.o.   MRN: [id number]  Chief Complaint  Patient presents with   Consult    Pt states he was in hospital dec.fluid, SOB for a few months now.    Referring provider: Katrinka Garnette KIDD, MD  HPI:   89 y.o. man whom are seen in hospital follow-up of congestive heart failure with pleural effusions.  Discharge summary reviewed.  Most recent cardiology note reviewed.  Most recent pulmonary consult note reviewed.  Developed worsening shortness of breath swelling.  Hospitalized 09/2023.  Discharged after short hospitalization with oral diuretic therapy.  He was eager to leave the hospital.  He did not take Lasix  as prescribed.  Similar symptoms developed again and he was admitted the next month, 10/2023 with similar symptoms.  Volume overload and shortness of breath.  Chest x-ray 09/2023 with bilateral right greater than left pleural effusions.  Chest x-ray 10/2023 showed loculation of right effusion, right greater than left effusions.  CT scan 09/2023 showed evidence of peribronchovascular groundglass opacities and bilateral pleural effusions concerning for cardiogenic volume overload on my review and interpretation.  He underwent thoracentesis 10/2023.  Pleural studies reviewed.  Transudative in nature.  He was discharged home.  Seen by cardiology in follow-up recently.  Increased dose of Lasix .  In the interim since discharge show/23/2 chest x-rays, 11/13/2023 and 12/22/2023.  Both show residual improvement in bilateral effusions, both are stable and unchanged compared to each other but certainly improved from chest x-ray 11/02/2023.  He remains short of breath.  He is not sure why.  We discussed at length the cardiac etiologies of his shortness of breath.  We discussed possible pulmonary contributors.  We discussed deconditioning and need for increased cardiovascular endurance as well.    Questionaires / Pulmonary Flowsheets:   ACT:       No data to display          MMRC:     No data to display          Epworth:      No data to display          Tests:   FENO:  No results found for: NITRICOXIDE  PFT:     No data to display          WALK:      No data to display          Imaging: Personally reviewed and as per EMR and discussion in this note DG Chest 2 View Result Date: 12/27/2023 CLINICAL DATA:  Follow-up pleural effusion. EXAM: CHEST - 2 VIEW COMPARISON:  11/13/2023; FINDINGS: Unchanged cardiac silhouette and mediastinal contours. Stable positioning of support apparatus. Unchanged small right and trace left-sided pleural effusions with associated bibasilar heterogeneous/consolidative opacities, right-greater-than-left. The lungs remain hyperexpanded. Overall improved aeration of the lungs. No pneumothorax. No definite evidence of edema. No acute osseous abnormalities. Stigmata of dish throughout the thoracic spine. IMPRESSION: Similar findings of lung hyperexpansion and unchanged small right and trace left-sided pleural effusions with associated bibasilar heterogeneous/consolidative opacities, right-greater-than-left, likely atelectasis. Electronically Signed   By: Norleen Roulette M.D.   On: 12/27/2023 16:01   CUP PACEART INCLINIC DEVICE CHECK Result Date: 12/27/2023 Normal in-clinic Pacemaker check. Thresholds, sensing, and impedance WNL or stable for patient over time. 7 NSVT classified episodes AF w/ RVR. Estimated longevity 10 years . Pt enrolled in remote follow-up.Augustin Quivers, RN  CUP PACEART REMOTE DEVICE CHECK Result Date: 12/18/2023 Scheduled remote  reviewed. Normal device function.  7 VHR episodes, longest 14 seconds, available EGMs show irregular R-R c/w AF with RVR, known AF, on Eliquis  per Epic. Next remote 91 days. [place], CVRS   Lab Results: Personally reviewed CBC    Component Value Date/Time   WBC 5.8 11/13/2023 1223   RBC 4.43 11/13/2023 1223   HGB 13.6 11/13/2023 1223    HGB 13.9 09/08/2023 1448   HCT 40.7 11/13/2023 1223   HCT 42.2 09/08/2023 1448   PLT 166.0 11/13/2023 1223   PLT 142 (L) 09/08/2023 1448   MCV 92.0 11/13/2023 1223   MCV 93 09/08/2023 1448   MCH 30.6 11/03/2023 0347   MCHC 33.3 11/13/2023 1223   RDW 15.0 11/13/2023 1223   RDW 12.7 09/08/2023 1448   LYMPHSABS 1.4 11/13/2023 1223   LYMPHSABS 1.6 01/13/2017 1533   MONOABS 0.5 11/13/2023 1223   EOSABS 0.2 11/13/2023 1223   EOSABS 0.1 01/13/2017 1533   BASOSABS 0.0 11/13/2023 1223   BASOSABS 0.0 01/13/2017 1533    BMET    Component Value Date/Time   NA 141 11/30/2023 1043   NA 143 09/08/2023 1448   K 4.0 11/30/2023 1043   CL 104 11/30/2023 1043   CO2 30 11/30/2023 1043   GLUCOSE 85 11/30/2023 1043   GLUCOSE 85 10/17/2008 0000   BUN 31 (H) 11/30/2023 1043   BUN 35 09/08/2023 1448   CREATININE 1.14 11/30/2023 1043   CREATININE 1.00 08/13/2020 1610   CALCIUM 9.1 11/30/2023 1043   GFRNONAA >60 11/03/2023 0347   GFRNONAA 66 08/13/2020 1610   GFRAA 77 08/13/2020 1610    BNP    Component Value Date/Time   BNP 243.6 (H) 10/28/2023 1415    ProBNP No results found for: PROBNP  Specialty Problems       Pulmonary Problems   Obstructive sleep apnea   CPAP not wearing  NPSG 2006:  AHI 100/hr Titration 2006: optimal pressure 16cm.  Auto 2014:  Optimal pressure 17-18cm      Pleural effusion   Recurrent right pleural effusion   Bilateral pleural effusion    No Known Allergies  Immunization History  Administered Date(s) Administered   Fluad Quad(high Dose 65+) 08/23/2019, 08/13/2020, 08/12/2021   Influenza Split 08/02/2012   Influenza Whole 09/27/2007, 08/30/2008, 08/16/2009, 08/05/2010   Influenza, High Dose Seasonal PF 10/05/2017, 09/17/2018, 09/07/2022   Influenza,inj,Quad PF,6+ Mos 08/04/2013, 10/04/2014, 10/08/2015, 09/29/2016   Influenza-Unspecified 08/25/2023   PFIZER Comirnaty(Gray Top)Covid-19 Tri-Sucrose Vaccine 08/20/2022   PFIZER(Purple  Top)SARS-COV-2 Vaccination 12/09/2019, 12/30/2019, 08/20/2020, 03/25/2021, 09/11/2021, 03/31/2022   Pneumococcal Conjugate-13 05/25/2015   Pneumococcal Polysaccharide-23 10/22/2007   Tdap 05/25/2015   Unspecified SARS-COV-2 Vaccination 08/25/2023    Past Medical History:  Diagnosis Date   Allergy    Arthritis    Atrial fibrillation -permanent    BPH (benign prostatic hyperplasia)    Cancer (HCC)    HX OF SKIN CANCER    CHF (congestive heart failure) (HCC)    resolved after pacemaker - tachycardia induced   Complete heart block (HCC)    Dyspnea    mild   ED (erectile dysfunction)    GERD (gastroesophageal reflux disease)    GLUCOSE INTOLERANCE 10/22/2007   no recent issues   Heart murmur    OSA (obstructive sleep apnea)    NO CPAP    Pacemaker BSX    dual   Pneumonia    HX OF SEVERAL TIMES AS A CHILD    PONV (postoperative nausea and vomiting)    at  age 69    Presence of permanent cardiac pacemaker    SUBACUTE BACTERIAL ENDOCARDITIS 1970s    Tobacco History: Social History   Tobacco Use  Smoking Status Never  Smokeless Tobacco Never   Counseling given: Not Answered   Continue to not smoke  Outpatient Encounter Medications as of 12/29/2023  Medication Sig   apixaban  (ELIQUIS ) 5 MG TABS tablet Take 1 tablet by mouth twice daily   Cholecalciferol (VITAMIN D -3 PO) Take 1 tablet by mouth daily.   Cyanocobalamin  (VITAMIN B-12 PO) Take 2 tablets by mouth daily.   digoxin  (LANOXIN ) 0.125 MG tablet Take 1 tablet (0.125 mg total) by mouth daily.   furosemide  (LASIX ) 40 MG tablet Take 1.5 tablets (60 mg total) by mouth in the morning.   Multiple Vitamins-Minerals (OCUVITE EYE HEALTH FORMULA PO) Take 1 tablet by mouth in the morning and at bedtime.   potassium chloride  SA (KLOR-CON  M) 20 MEQ tablet Take 2 tablets (40 mEq total) by mouth daily.   verapamil  (CALAN -SR) 180 MG CR tablet Take 1 tablet (180 mg total) by mouth every evening.   No facility-administered encounter  medications on file as of 12/29/2023.     Review of Systems  Review of Systems  No chest pain exertion.  No orthopnea or PND.  Comprehensive review of systems otherwise negative. Physical Exam  BP 118/73 (BP Location: Left Arm, Patient Position: Sitting, Cuff Size: Normal)   Pulse 78   Ht 5' 10 (1.778 m)   Wt 143 lb 12.8 oz (65.2 kg)   SpO2 95%   BMI 20.63 kg/m   Wt Readings from Last 5 Encounters:  12/29/23 143 lb 12.8 oz (65.2 kg)  12/21/23 142 lb 12.8 oz (64.8 kg)  11/13/23 143 lb 9.6 oz (65.1 kg)  11/03/23 141 lb 15.6 oz (64.4 kg)  10/13/23 149 lb (67.6 kg)    BMI Readings from Last 5 Encounters:  12/29/23 20.63 kg/m  12/21/23 20.49 kg/m  11/13/23 20.60 kg/m  11/03/23 20.37 kg/m  10/13/23 21.38 kg/m     Physical Exam General: Thin, sitting in chair Eyes: EOMI, no icterus Neck: Supple, no JVP Pulmonary: Clear throughout, normal work of breathing Cardiovascular: Warm, no edema Abdomen: Nondistended, bowel sounds present MSK: No synovitis, no joint effusion Neuro: Normal gait, no weakness Psych: Normal mood, full affect   Assessment & Plan:   Pleural effusions: Right greater than left.  Bilateral.  Associated with signs of pulmonary edema on chest imaging, x-ray and CT.  Echocardiogram with clear signs of chronic pulmonary venous hypertension, dilated left atrium etc.  Fluid sample via thoracentesis 10/2023, transudative.  All evidence points to cardiogenic volume overload as etiology.  Notably, BNP elevation and general edema noted during hospitalization consistent with CHF exacerbation.  Overall improved with hospitalization and IV diuresis and ongoing efforts at decreased intake and oral diuretics at home.  Encouraged ongoing cardiology follow-up.  Dyspnea on exertion: Most likely cardiac in nature given echocardiogram findings, cardiogenic volume overload.  Parenchyma on CT scan 09/2023 clear.  Further exacerbation of symptoms in the setting of  deconditioning, 2 hospitalizations and decrease in activity over time over the last few months.  Some hyperinflation, reactive airways disease considered, never smoker.  PFTs for additional workup ordered today.   Return in about 2 months (around 02/26/2024) for f/u Dr. Annella, after PFT.   Donnice JONELLE Annella, MD 12/29/2023   This appointment required 45 minutes of patient care (this includes precharting, chart review, review of results, face-to-face care, etc.).

## 2024-01-04 ENCOUNTER — Other Ambulatory Visit: Payer: Self-pay

## 2024-01-04 DIAGNOSIS — I10 Essential (primary) hypertension: Secondary | ICD-10-CM

## 2024-01-04 DIAGNOSIS — I4821 Permanent atrial fibrillation: Secondary | ICD-10-CM

## 2024-01-04 DIAGNOSIS — I4729 Other ventricular tachycardia: Secondary | ICD-10-CM

## 2024-01-04 DIAGNOSIS — N1832 Chronic kidney disease, stage 3b: Secondary | ICD-10-CM | POA: Diagnosis not present

## 2024-01-04 DIAGNOSIS — Z79899 Other long term (current) drug therapy: Secondary | ICD-10-CM

## 2024-01-04 NOTE — Progress Notes (Signed)
 Received call from [facility] stating pt has presented for labs but unable to view orders.  RN is able to see orders but nothing appears in order review. Orders replaced and released to [facility].

## 2024-01-05 ENCOUNTER — Telehealth: Payer: Self-pay

## 2024-01-05 DIAGNOSIS — R7889 Finding of other specified substances, not normally found in blood: Secondary | ICD-10-CM

## 2024-01-05 DIAGNOSIS — Z79899 Other long term (current) drug therapy: Secondary | ICD-10-CM

## 2024-01-05 LAB — BASIC METABOLIC PANEL
BUN/Creatinine Ratio: 30 — ABNORMAL HIGH (ref 10–24)
BUN: 36 mg/dL (ref 10–36)
CO2: 25 mmol/L (ref 20–29)
Calcium: 9.2 mg/dL (ref 8.6–10.2)
Chloride: 105 mmol/L (ref 96–106)
Creatinine, Ser: 1.21 mg/dL (ref 0.76–1.27)
Glucose: 104 mg/dL — ABNORMAL HIGH (ref 70–99)
Potassium: 4.5 mmol/L (ref 3.5–5.2)
Sodium: 145 mmol/L — ABNORMAL HIGH (ref 134–144)
eGFR: 56 mL/min/{1.73_m2} — ABNORMAL LOW (ref >59)

## 2024-01-05 LAB — DIGOXIN LEVEL: Digoxin, Serum: 1 ng/mL — ABNORMAL HIGH (ref 0.5–0.9)

## 2024-01-05 MED ORDER — DIGOXIN 125 MCG PO TABS: ORAL_TABLET | ORAL | Status: DC

## 2024-01-05 NOTE — Telephone Encounter (Signed)
Spoke with Dr Anne Fu, DOD who advises pt should Decrease Digoxin 0.125mg  to 1 tablet daily Monday - Friday and repeat Digoxin level in 2 weeks.. Attempted phone call to pt.  OK per Epic to leave detailed voicemail message.  Pt advised elevated Dig level and recommendations as above per Dr Anne Fu.  Pt to call [phone] for further questions or concerns.  Will also post to pt's MyChart.    1 d ago  Digoxin, Serum 1.0 High   Comment: Concentrations above 2.0 ng/mL are generally considered toxic. Some overlap of toxic and non-toxic values have been reported.                             Detection Limit = 0.4 ng/mL Therapeutic range is derived from 2013 ACCF/AHA Guidelines for the Management of Heart Failure.

## 2024-01-06 ENCOUNTER — Encounter: Payer: Self-pay | Admitting: Pulmonary Disease

## 2024-01-06 ENCOUNTER — Ambulatory Visit: Payer: Medicare HMO | Admitting: Pulmonary Disease

## 2024-01-06 ENCOUNTER — Telehealth: Payer: Self-pay

## 2024-01-06 VITALS — BP 116/64 | HR 71 | Temp 97.5°F | Ht 70.0 in | Wt 142.0 lb

## 2024-01-06 DIAGNOSIS — R0609 Other forms of dyspnea: Secondary | ICD-10-CM | POA: Diagnosis not present

## 2024-01-06 LAB — PULMONARY FUNCTION TEST
DL/VA: 3.54 ml/min/mmHg/L
DLCO cor: 18.03 ml/min/mmHg
DLCO unc: 18.03 ml/min/mmHg
FEF 25-75 Post: 2.86 L/s
FEF 25-75 Pre: 2.26 L/s
FEF2575-%Change-Post: 26 %
FEF2575-%Pred-Post: 207 %
FEF2575-%Pred-Pre: 164 %
FEV1-%Change-Post: 3 %
FEV1-%Pred-Post: 110 %
FEV1-%Pred-Pre: 107 %
FEV1-Post: 2.6 L
FEV1-Pre: 2.52 L
FEV1FVC-%Change-Post: 5 %
FEV1FVC-%Pred-Pre: 114 %
FEV6-%Change-Post: -1 %
FEV6-%Pred-Post: 97 %
FEV6-%Pred-Pre: 98 %
FEV6-Post: 3.11 L
FEV6-Pre: 3.16 L
FEV6FVC-%Pred-Post: 108 %
FEV6FVC-%Pred-Pre: 108 %
FVC-%Change-Post: -2 %
FVC-%Pred-Post: 89 %
FVC-%Pred-Pre: 91 %
FVC-Post: 3.11 L
FVC-Pre: 3.19 L
Post FEV1/FVC ratio: 84 %
Post FEV6/FVC ratio: 100 %
Pre FEV1/FVC ratio: 79 %
Pre FEV6/FVC Ratio: 100 %
RV % pred: 113 %
RV: 3.3 L
TLC % pred: 88 %
TLC: 6.27 L

## 2024-01-06 NOTE — Telephone Encounter (Signed)
-----   Message from Sherryl Manges sent at 01/04/2024 12:06 PM EST ----- Please Inform Patient that -CXR still   is abnormal and will require further eval and he should follow up with his PCP about this   Thanks

## 2024-01-06 NOTE — Progress Notes (Signed)
@Patient  ID: Eric Lambert, male    DOB: Jul 31, 1929, 89 y.o.   MRN: [id number]  Chief Complaint  Patient presents with   Follow-up    Doing well.  Review PFT from today.    Referring provider: [name], MD  HPI:   89 y.o. man whom are seen in hospital follow-up of congestive heart failure with pleural effusions.    Returns for pulmonary function test.  Reviewed and discussed in detail.  Normal spirometry no bronchodilator response.  Lung volumes largely normal may be some mild air trapping.  DLCO could not be completed.  Overall quite reassuring.  This is combination with normal parenchyma.  Unlikely lungs are limiting factor.  Could consider bronchodilators.  Discussed at length with patient.  Given risk and benefits after shared decision making we deferred this today.  HPI initial visit: Developed worsening shortness of breath swelling.  Hospitalized 09/2023.  Discharged after short hospitalization with oral diuretic therapy.  He was eager to leave the hospital.  He did not take Lasix as prescribed.  Similar symptoms developed again and he was admitted the next month, 10/2023 with similar symptoms.  Volume overload and shortness of breath.  Chest x-ray 09/2023 with bilateral right greater than left pleural effusions.  Chest x-ray 10/2023 showed loculation of right effusion, right greater than left effusions.  CT scan 09/2023 showed evidence of peribronchovascular groundglass opacities and bilateral pleural effusions concerning for cardiogenic volume overload on my review and interpretation.  He underwent thoracentesis 10/2023.  Pleural studies reviewed.  Transudative in nature.  He was discharged home.  Seen by cardiology in follow-up recently.  Increased dose of Lasix.  In the interim since discharge show/23/2 chest x-rays, 11/13/2023 and 12/22/2023.  Both show residual improvement in bilateral effusions, both are stable and unchanged compared to each other but certainly  improved from chest x-ray 11/02/2023.  He remains short of breath.  He is not sure why.  We discussed at length the cardiac etiologies of his shortness of breath.  We discussed possible pulmonary contributors.  We discussed deconditioning and need for increased cardiovascular endurance as well.    Questionaires / Pulmonary Flowsheets:   ACT:      No data to display          MMRC:     No data to display          Epworth:      No data to display          Tests:   FENO:  No results found for: "NITRICOXIDE"  PFT:    Latest Ref Rng & Units 01/06/2024    8:46 AM  PFT Results  FVC-Pre L 3.19  P  FVC-Predicted Pre % 91  P  FVC-Post L 3.11  P  FVC-Predicted Post % 89  P  Pre FEV1/FVC % % 79  P  Post FEV1/FCV % % 84  P  FEV1-Pre L 2.52  P  FEV1-Predicted Pre % 107  P  FEV1-Post L 2.60  P  DLCO uncorrected ml/min/mmHg 18.03  P  DLCO corrected ml/min/mmHg 18.03  P  TLC L 6.27  P  TLC % Predicted % 88  P  RV % Predicted % 113  P    P Preliminary result  Personally viewed and serve as normal spirometry, no bronchodilator response.  Lung capacity within normal limits, some mild air trapping with elevated RV/TLC ratio.  DLCO could not be interpreted, no values reported, could not perform maneuvers.  WALK:      No data to display          Imaging: Personally reviewed and as per EMR and discussion in this note DG Chest 2 View Result Date: 12/27/2023 CLINICAL DATA:  Follow-up pleural effusion. EXAM: CHEST - 2 VIEW COMPARISON:  11/13/2023; FINDINGS: Unchanged cardiac silhouette and mediastinal contours. Stable positioning of support apparatus. Unchanged small right and trace left-sided pleural effusions with associated bibasilar heterogeneous/consolidative opacities, right-greater-than-left. The lungs remain hyperexpanded. Overall improved aeration of the lungs. No pneumothorax. No definite evidence of edema. No acute osseous abnormalities. Stigmata of dish throughout  the thoracic spine. IMPRESSION: Similar findings of lung hyperexpansion and unchanged small right and trace left-sided pleural effusions with associated bibasilar heterogeneous/consolidative opacities, right-greater-than-left, likely atelectasis. Electronically Signed   By: Simonne Come M.D.   On: 12/27/2023 16:01   CUP PACEART INCLINIC DEVICE CHECK Result Date: 12/27/2023 Normal in-clinic Pacemaker check. Thresholds, sensing, and impedance WNL or stable for patient over time. 7 NSVT classified episodes AF w/ RVR. Estimated longevity 10 years . Pt enrolled in remote follow-up.Raj Janus, RN  CUP PACEART REMOTE DEVICE CHECK Result Date: 12/18/2023 Scheduled remote reviewed. Normal device function.  7 VHR episodes, longest 14 seconds, available EGMs show irregular R-R c/w AF with RVR, known AF, on Eliquis per Epic. Next remote 91 days. [place], CVRS   Lab Results: Personally reviewed CBC    Component Value Date/Time   WBC 5.8 11/13/2023 1223   RBC 4.43 11/13/2023 1223   HGB 13.6 11/13/2023 1223   HGB 13.9 09/08/2023 1448   HCT 40.7 11/13/2023 1223   HCT 42.2 09/08/2023 1448   PLT 166.0 11/13/2023 1223   PLT 142 (L) 09/08/2023 1448   MCV 92.0 11/13/2023 1223   MCV 93 09/08/2023 1448   MCH 30.6 11/03/2023 0347   MCHC 33.3 11/13/2023 1223   RDW 15.0 11/13/2023 1223   RDW 12.7 09/08/2023 1448   LYMPHSABS 1.4 11/13/2023 1223   LYMPHSABS 1.6 01/13/2017 1533   MONOABS 0.5 11/13/2023 1223   EOSABS 0.2 11/13/2023 1223   EOSABS 0.1 01/13/2017 1533   BASOSABS 0.0 11/13/2023 1223   BASOSABS 0.0 01/13/2017 1533    BMET    Component Value Date/Time   NA 145 (H) 01/04/2024 1446   K 4.5 01/04/2024 1446   CL 105 01/04/2024 1446   CO2 25 01/04/2024 1446   GLUCOSE 104 (H) 01/04/2024 1446   GLUCOSE 85 11/30/2023 1043   GLUCOSE 85 10/17/2008 0000   BUN 36 01/04/2024 1446   CREATININE 1.21 01/04/2024 1446   CREATININE 1.00 08/13/2020 1610   CALCIUM 9.2 01/04/2024 1446   GFRNONAA >60 11/03/2023  0347   GFRNONAA 66 08/13/2020 1610   GFRAA 77 08/13/2020 1610    BNP    Component Value Date/Time   BNP 243.6 (H) 10/28/2023 1415    ProBNP No results found for: "PROBNP"  Specialty Problems       Pulmonary Problems   Obstructive sleep apnea   CPAP not wearing  NPSG 2006:  AHI 100/hr Titration 2006: optimal pressure 16cm.  Auto 2014:  Optimal pressure 17-18cm      Pleural effusion   Recurrent right pleural effusion   Bilateral pleural effusion    No Known Allergies  Immunization History  Administered Date(s) Administered   Fluad Quad(high Dose 65+) 08/23/2019, 08/13/2020, 08/12/2021   Influenza Split 08/02/2012   Influenza Whole 09/27/2007, 08/30/2008, 08/16/2009, 08/05/2010   Influenza, High Dose Seasonal PF 10/05/2017, 09/17/2018, 09/07/2022   Influenza,inj,Quad PF,6+  Mos 08/04/2013, 10/04/2014, 10/08/2015, 09/29/2016   Influenza-Unspecified 08/25/2023   PFIZER Comirnaty(Gray Top)Covid-19 Tri-Sucrose Vaccine 08/20/2022   PFIZER(Purple Top)SARS-COV-2 Vaccination 12/09/2019, 12/30/2019, 08/20/2020, 03/25/2021, 09/11/2021, 03/31/2022   Pneumococcal Conjugate-13 05/25/2015   Pneumococcal Polysaccharide-23 10/22/2007   Tdap 05/25/2015   Unspecified SARS-COV-2 Vaccination 08/25/2023    Past Medical History:  Diagnosis Date   Allergy    Arthritis    Atrial fibrillation -permanent    BPH (benign prostatic hyperplasia)    Cancer (HCC)    HX OF SKIN CANCER    CHF (congestive heart failure) (HCC)    resolved after pacemaker - tachycardia induced   Complete heart block (HCC)    Dyspnea    mild   ED (erectile dysfunction)    GERD (gastroesophageal reflux disease)    GLUCOSE INTOLERANCE 10/22/2007   no recent issues   Heart murmur    OSA (obstructive sleep apnea)    NO CPAP    Pacemaker BSX    dual   Pneumonia    HX OF SEVERAL TIMES AS A CHILD    PONV (postoperative nausea and vomiting)    at age 87    Presence of permanent cardiac pacemaker    SUBACUTE  BACTERIAL ENDOCARDITIS 1970s    Tobacco History: Social History   Tobacco Use  Smoking Status Never  Smokeless Tobacco Never   Counseling given: Not Answered   Continue to not smoke  Outpatient Encounter Medications as of 01/06/2024  Medication Sig   apixaban (ELIQUIS) 5 MG TABS tablet Take 1 tablet by mouth twice daily   Cholecalciferol (VITAMIN D-3 PO) Take 1 tablet by mouth daily.   Cyanocobalamin (VITAMIN B-12 PO) Take 2 tablets by mouth daily.   digoxin (LANOXIN) 0.125 MG tablet Take 1 tablet by mouth on days Monday-Friday.   furosemide (LASIX) 40 MG tablet Take 1.5 tablets (60 mg total) by mouth in the morning.   Multiple Vitamins-Minerals (OCUVITE EYE HEALTH FORMULA PO) Take 1 tablet by mouth in the morning and at bedtime.   potassium chloride SA (KLOR-CON M) 20 MEQ tablet Take 2 tablets (40 mEq total) by mouth daily.   verapamil (CALAN-SR) 180 MG CR tablet Take 1 tablet (180 mg total) by mouth every evening.   No facility-administered encounter medications on file as of 01/06/2024.     Review of Systems  Review of Systems  No chest pain exertion.  No orthopnea or PND.  Comprehensive review of systems otherwise negative. Physical Exam  BP 116/64 (BP Location: Left Arm, Patient Position: Sitting, Cuff Size: Normal)   Pulse 71   Temp (!) 97.5 F (36.4 C) (Oral)   Ht 5\' 10"  (1.778 m)   Wt 142 lb (64.4 kg)   SpO2 98%   BMI 20.37 kg/m   Wt Readings from Last 5 Encounters:  01/06/24 142 lb (64.4 kg)  12/29/23 143 lb 12.8 oz (65.2 kg)  12/21/23 142 lb 12.8 oz (64.8 kg)  11/13/23 143 lb 9.6 oz (65.1 kg)  11/03/23 141 lb 15.6 oz (64.4 kg)    BMI Readings from Last 5 Encounters:  01/06/24 20.37 kg/m  12/29/23 20.63 kg/m  12/21/23 20.49 kg/m  11/13/23 20.60 kg/m  11/03/23 20.37 kg/m     Physical Exam General: Thin, sitting in chair Eyes: EOMI, no icterus Neck: Supple, no JVP Pulmonary: Clear throughout, normal work of breathing Cardiovascular: Warm,  no edema Abdomen: Nondistended, bowel sounds present MSK: No synovitis, no joint effusion Neuro: Normal gait, no weakness Psych: Normal mood, full affect  Assessment & Plan:   Pleural effusions: Right greater than left.  Bilateral.  Associated with signs of pulmonary edema on chest imaging, x-ray and CT.  Echocardiogram with clear signs of chronic pulmonary venous hypertension, dilated left atrium etc.  Fluid sample via thoracentesis 10/2023, transudative.  All evidence points to cardiogenic volume overload as etiology.  Notably, BNP elevation and general edema noted during hospitalization consistent with CHF exacerbation.  Overall improved with hospitalization and IV diuresis and ongoing efforts at decreased intake and oral diuretics at home.  Encouraged ongoing cardiology follow-up.  Dyspnea on exertion: Most likely cardiac in nature given echocardiogram findings, cardiogenic volume overload.  Parenchyma on CT scan 09/2023 clear.  Further exacerbation of symptoms in the setting of deconditioning, 2 hospitalizations and decrease in activity over time over the last few months.  Some hyperinflation, reactive airways disease considered, never smoker.  PFTs normal, mild air trapping.  Unlikely the bronchodilators will help, we did discuss this at length today.  After shared decision made we deferred additional medication at this time.  Could reconsider in the future.   Return if symptoms worsen or fail to improve.   Karren Burly, MD 01/06/2024

## 2024-01-06 NOTE — Patient Instructions (Addendum)
The results of your pulmonary function test are overall quite normal.  This combined with your normal images makes me think the lungs are very healthy.  This is reassuring.  There is some mild air trapping based on some of the parameters of the test.  Frankly, I am not sure inhalers will help with this.  We could always consider again in the future.  Return to clinic as needed

## 2024-01-06 NOTE — Patient Instructions (Signed)
Full PFT performed today.

## 2024-01-06 NOTE — Telephone Encounter (Signed)
Spoke with pt and advised of CXR result and recommendation per Dr Graciela Husbands.  Pt verbalizes understanding and states he will contact PCP.  Pt requested that RN also forward result to Pulmonologist.

## 2024-01-06 NOTE — Progress Notes (Signed)
Full PFT performed today.

## 2024-01-07 ENCOUNTER — Telehealth: Payer: Self-pay | Admitting: Cardiology

## 2024-01-07 ENCOUNTER — Telehealth: Payer: Self-pay

## 2024-01-07 ENCOUNTER — Encounter: Payer: Self-pay | Admitting: Family Medicine

## 2024-01-07 NOTE — Telephone Encounter (Signed)
Lets schedule follow-up to discuss potential next steps-it is not a rush as long as he is feeling well but would like to get him in in the next month or so

## 2024-01-07 NOTE — Telephone Encounter (Signed)
Patient called in reporting his is unclear on the plan regarding his chest xray from Dr. Graciela Husbands. Was seen by pulmonary and has been in discussion with his PCP as well. Unclear what to do with his medications. Advised I would route to the office for follow up regarding recommendations

## 2024-01-07 NOTE — Telephone Encounter (Signed)
Previous encounter has been sent to Dr. Durene Cal.  Copied from CRM [id number]. Topic: Clinical - Medical Advice >> Jan 07, 2024  8:22 AM Fredrich Romans wrote: Reason for CRM: patient was advised by his cardiologist to follow up with his PCP regarding his chest xray results.Patient would like to know wHat would Dr hunter ike for him to do going forward?

## 2024-01-07 NOTE — Telephone Encounter (Signed)
Do you want pt to schedule ov with you to discuss this?  Copied from CRM [id number]. Topic: Clinical - Medical Advice >> Jan 07, 2024  8:22 AM Fredrich Romans wrote: Reason for CRM: patient was advised by his cardiologist to follow up with his PCP regarding his chest xray results.Patient would like to know wHat would Dr hunter ike for him to do going forward?

## 2024-01-08 ENCOUNTER — Telehealth: Payer: Self-pay | Admitting: Family Medicine

## 2024-01-08 NOTE — Telephone Encounter (Signed)
Copied from CRM [id number]. Topic: Clinical - Medication Refill >> Jan 08, 2024 12:17 PM Aletta Edouard wrote: Most Recent Primary Care Visit:  Provider: [facility]  Department: [facility]  Visit Type: LAB  Date: 11/30/2023  Medication: furosemide (LASIX) 40 MG tablet  Has the patient contacted their pharmacy? Yes (Agent: If no, request that the patient contact the pharmacy for the refill. If patient does not wish to contact the pharmacy document the reason why and proceed with request.) (Agent: If yes, when and what did the pharmacy advise?)  Is this the correct pharmacy for this prescription? Yes If no, delete pharmacy and type the correct one.  This is the patient's preferred pharmacy:  [facility] [address] [place] - [address] [place] [address] Phone: [phone] Fax: [phone]  [facility] [address] [place] [address] Phone: [phone] Fax: [phone]   Has the prescription been filled recently? No  Is the patient out of the medication? Yes  Has the patient been seen for an appointment in the last year OR does the patient have an upcoming appointment? Yes  Can we respond through MyChart? No  Agent: Please be advised that Rx refills may take up to 3 business days. We ask that you follow-up with your pharmacy.

## 2024-01-08 NOTE — Telephone Encounter (Signed)
Left message informing pt this message would forwarded to Dr. Odessa Fleming nurse who would follow up with him next week.

## 2024-01-08 NOTE — Telephone Encounter (Signed)
CRM message below was sent to PCP Tana Conch by mistake. Please review pt call regarding prescription

## 2024-01-08 NOTE — Telephone Encounter (Signed)
Copied from CRM [id number]. Topic: Clinical - Prescription Issue >> Jan 08, 2024 12:04 PM Eunice Blase wrote: Reason for CRM: Pt called is unclear what the dosage should be and pharmacy also needs clarification for furosemide (LASIX) 40 MG tablet. Please call pt [phone].

## 2024-01-11 ENCOUNTER — Telehealth: Payer: Self-pay

## 2024-01-11 ENCOUNTER — Telehealth: Payer: Self-pay | Admitting: Internal Medicine

## 2024-01-11 NOTE — Telephone Encounter (Signed)
 This Rx is filled by Dr. Graciela Husbands, deffering to his office for refill.  Copied from CRM [id number]. Topic: Clinical - Medication Refill >> Jan 08, 2024 12:17 PM Aletta Edouard wrote: Most Recent Primary Care Visit:  Provider: [facility]  [facility]  Visit Type: LAB  Date: 11/30/2023  Medication: furosemide (LASIX) 40 MG tablet  Has the patient contacted their pharmacy? Yes (Agent: If no, request that the patient contact the pharmacy for the refill. If patient does not wish to contact the pharmacy document the reason why and proceed with request.) (Agent: If yes, when and what did the pharmacy advise?)  Is this the correct pharmacy for this prescription? Yes If no, delete pharmacy and type the correct one.  This is the patient's preferred pharmacy:  [facility] [address], [place] - [address] [place] [address] Phone: [phone] Fax: [phone]  [facility] [address] [place] [address] Phone: [phone] Fax: [phone]   Has the prescription been filled recently? No  Is the patient out of the medication? Yes  Has the patient been seen for an appointment in the last year OR does the patient have an upcoming appointment? Yes  Can we respond through MyChart? No  Agent: Please be advised that Rx refills may take up to 3 business days. We ask that you follow-up with your pharmacy.

## 2024-01-11 NOTE — Telephone Encounter (Signed)
 Patient is needing a call back regarding the message that was left on his voicemail

## 2024-01-11 NOTE — Telephone Encounter (Signed)
 Spoke with patient and he states he is confused about the amount of lasix he should be taking. He states his new prescription came from his PCP on Friday.  Current med list states 60 mg daily. He states he dont think that is correct so he has been taking 80 mg. Advised he should take lasix how it is prescribed.   He would like to know who will be prescribing his lasix and the correct dose

## 2024-01-11 NOTE — Telephone Encounter (Signed)
 From Dr. Odessa Fleming last note "Does not note he diuresis well with the 40 mg twice daily so increase it to 80 every morning "  Unless there is a more recent note- I believe he should be on lasix 80 mg each morning. Dr. Graciela Husbands still left the prescription as 60 mg but his note definitely says 80 mg- I would defer to his expertise on this and say 80 mg

## 2024-01-11 NOTE — Telephone Encounter (Signed)
 See below

## 2024-01-11 NOTE — Telephone Encounter (Signed)
 Please schedule ov to discuss next steps.

## 2024-01-11 NOTE — Telephone Encounter (Signed)
 It looks like pt is returning your call but it got routed to our office.

## 2024-01-11 NOTE — Telephone Encounter (Signed)
 Please see patient message and advise your advice/recommendations

## 2024-01-11 NOTE — Telephone Encounter (Signed)
 Pt c/o medication issue:  1. Name of Medication:   furosemide (LASIX) 40 MG tablet   2. How are you currently taking this medication (dosage and times per day)?   Not taking as prescribed  3. Are you having a reaction (difficulty breathing--STAT)?   4. What is your medication issue?   Patient wants a call back to confirm this medication what dosage of this medication he should be taking.

## 2024-01-13 ENCOUNTER — Telehealth: Payer: Self-pay | Admitting: *Deleted

## 2024-01-13 MED ORDER — FUROSEMIDE 40 MG PO TABS: 60.0000 mg | ORAL_TABLET | Freq: Every morning | ORAL | 5 refills | Status: DC

## 2024-01-13 NOTE — Telephone Encounter (Signed)
 Copied from CRM [id number]. Topic: General - Other >> Jan 13, 2024 10:11 AM Kathryne Eriksson wrote: Reason for CRM: Returning Office Call >> Jan 13, 2024 10:12 AM Kathryne Eriksson wrote: Patient is returning a missed office call from Jimmye Norman, LPN   See other message.

## 2024-01-13 NOTE — Telephone Encounter (Signed)
 Please see phone tree from 01/07/2024.  The issue is that we have not heard back from cardiology yet.   From the most recent note it looks like he should be on Lasix 60 mg daily so 1.5 tablets of the 40 mg dose-you can send in another prescription for Lasix 40 mg-take 1.5 tablets daily #45 with 5 refills and I will reach back out to cardiology   Staley, Demetris R, LPN to Me Regarding result: DG Chest 2 View     01/11/24  1:43 PM I was reading his last not and it says 80 mg in the am only but at the bottom for medication instructions says 80 mg for 5 days then back to 60 mg. So I am waiting for him to respond. His nurse is out today and I'm covering her box. Me to Judene Companion, LPN Regarding result: DG Chest 2 View     01/11/24 12:26 PM Hey Ms. Staley- wanted to follow up further " The last note I can see from Dr. Graciela Husbands mentioned the 80 mg in the morning-I did not realize it was only for a week and your most recent prescription was at 60 mg.  I think he may want to try the 80 mg but I am going to wait until he has a chance to r espond on this-I am forwarding message to his nursing team"  Thanks Tana Conch

## 2024-01-13 NOTE — Telephone Encounter (Signed)
 Patient is calling to follow up. Patient did mentioned he was concerned that when he saw Dr. Graciela Husbands on 12/21/23, Dr. Graciela Husbands changed the dosage amount of Lasix to 80 MG. Patient stated he then had a chest scan on 12/22/23 for the swelling. Patient is concerned the chest scan was completed too early and wouldn't show the full effect of him taking the Lasix at 80 MG. Please advise.

## 2024-01-13 NOTE — Telephone Encounter (Signed)
 FYI, Dr. Durene Cal see message from Cardiology.

## 2024-01-13 NOTE — Telephone Encounter (Signed)
 This has been addressed in another encounter.  Please see that encounter for complete details.

## 2024-01-13 NOTE — Telephone Encounter (Signed)
 Spoke to pt he is frustrated with the Rx Furosemide that was sent in by Dr. Durene Cal says take 40 mg daily, he has not been on the that dose due to fluid build up. He has been taking Furosemide 80 mg daily.  Pt says he has called Dr. Odessa Fleming office, but no one has returned his call. Pt would like clarification on dose.

## 2024-01-13 NOTE — Telephone Encounter (Signed)
 Left message on voicemail to call office.

## 2024-01-13 NOTE — Telephone Encounter (Signed)
 Spoke with pt and advised per Dr Graciela Husbands he is to be taking Furosemide 40mg  - 1.5 tablets by mouth daily.  Pt states he has unfortunately been taking Furosemide 40mg  - 2 tablets by mouth daily since his last visit with Dr Graciela Husbands but understands the current directions as above.  Pt repeated instructions back to RN.  He will advise Dr Graciela Husbands of any increased SOB or edema with the Furosemide 40mg  - 1.5 tablets daily each morning.  Pt thanked Charity fundraiser for the call.

## 2024-01-13 NOTE — Telephone Encounter (Signed)
 Spoke to pt told him per Dr. Durene Cal, from the most recent note it looks like he should be on Lasix 60 mg daily so 1.5 tablets daily. He has reached out to Cardiology but has not heard back yet. Also his nurse is out today and waiting for Dr. Graciela Husbands to respond also. Meanwhile I will send Rx to pharmacy for Furosemide 40 mg 1.5 tablets daily. Pt verbalized understanding. Rx sent.

## 2024-01-13 NOTE — Telephone Encounter (Signed)
 Dr. Durene Cal, please see message and clarify. Pt is frustrated.

## 2024-01-13 NOTE — Addendum Note (Signed)
 Addended by: Jimmye Norman on: 01/13/2024 10:34 AM   Modules accepted: Orders

## 2024-01-13 NOTE — Telephone Encounter (Signed)
 Copied from CRM [id number]. Topic: Clinical - Medication Question >> Jan 13, 2024  8:42 AM Elizebeth Brooking wrote: Reason for CRM: Patient called in regarding prescription  furosemide (LASIX) 40 MG tablet Stating that the pharmacy doesn't have the direstion of him taking 1.5 tablet so he has been taking 2 tablets as 80MG  . Is wanting a nurse to give him a call about this so he can get some clarification on what he should be taking

## 2024-01-14 ENCOUNTER — Ambulatory Visit: Payer: Self-pay

## 2024-01-14 NOTE — Telephone Encounter (Signed)
 Team please double check with him today and make sure everything is okay from his-he reported feeling frustrated yesterday-just want to make sure he realizes that [place] and cardiology are on the same page with the 60 mg and that we have sent this in for him

## 2024-01-14 NOTE — Telephone Encounter (Signed)
 This has been addressed in another encounter.  Please see that encounter for complete details.

## 2024-01-14 NOTE — Telephone Encounter (Signed)
 This is the patient you discussed yesterday Thanks SK

## 2024-01-14 NOTE — Patient Instructions (Signed)
 Visit Information  Thank you for taking time to visit with me today. Please don't hesitate to contact me if I can be of assistance to you.   Following are the goals we discussed today:   Goals Addressed               This Visit's Progress     Heart Failure Managment (pt-stated)        Patient Goals/Self Care Activities: -Patient/Caregiver will take medications as prescribed   -Patient/Caregiver will attend all scheduled provider appointments -Patient/Caregiver will call provider office for new concerns or questions   -Weigh daily and record (notify MD with 3 lb weight gain over night or 5 lb in a week) -Follow CHF Action Plan -Adhere to low sodium diet    Patient doing okay. Weight 137 lbs.  Discussed in depth heart failure and management.  He verbalized understanding. No RN CM concerns.         Our next appointment is by telephone on 02/11/24 at 1200 pm  Please call the care guide team at [phone] if you need to cancel or reschedule your appointment.   If you are experiencing a Mental Health or Behavioral Health Crisis or need someone to talk to, please call the Suicide and Crisis Lifeline: 988   Patient verbalizes understanding of instructions and care plan provided today and agrees to view in MyChart. Active MyChart status and patient understanding of how to access instructions and care plan via MyChart confirmed with patient.     The patient has been provided with contact information for the care management team and has been advised to call with any health related questions or concerns.   Bary Leriche RN, MSN [facility], [facility] RN Care Manager Direct Dial: [phone]  Fax: [phone] Website: [email or web address]

## 2024-01-14 NOTE — Telephone Encounter (Signed)
 Left pt detailed message of Dr Erasmo Leventhal below response

## 2024-01-14 NOTE — Telephone Encounter (Signed)
 Spoke with pt on 2/19.  See encounter dated 01/11/2024 regarding Furosemide. Reiterated chest xray result per Dr Graciela Husbands and recommendation to follow up with PCP.

## 2024-01-14 NOTE — Patient Outreach (Signed)
 Care Coordination   Follow Up Visit Note   01/14/2024 Name: Souleymane Saiki MRN: [id number] DOB: [birth year]  Keyler Hoge Eimers is a 89 y.o. year old male who sees Durene Cal, Aldine Contes, MD for primary care. I spoke with  [name] by phone today.  What matters to the patients health and wellness today?  Managing heart failure    Goals Addressed               This Visit's Progress     Heart Failure Managment (pt-stated)        Patient Goals/Self Care Activities: -Patient/Caregiver will take medications as prescribed   -Patient/Caregiver will attend all scheduled provider appointments -Patient/Caregiver will call provider office for new concerns or questions   -Weigh daily and record (notify MD with 3 lb weight gain over night or 5 lb in a week) -Follow CHF Action Plan -Adhere to low sodium diet    Patient doing okay. Weight 137 lbs.  Discussed in depth heart failure and management.  He verbalized understanding. No RN CM concerns.         SDOH assessments and interventions completed:  Yes     Care Coordination Interventions:  Yes, provided   Follow up plan: Follow up call scheduled for March    Encounter Outcome:  Patient Visit Completed   Bary Leriche RN, MSN [facility], [facility] RN Care Manager Direct Dial: [phone]  Fax: [phone] Website: [email or web address]

## 2024-01-18 ENCOUNTER — Telehealth: Payer: Self-pay

## 2024-01-18 ENCOUNTER — Encounter: Payer: Self-pay | Admitting: Internal Medicine

## 2024-01-18 ENCOUNTER — Ambulatory Visit: Payer: Medicare HMO | Attending: Adult Health | Admitting: Internal Medicine

## 2024-01-18 VITALS — Ht 70.0 in

## 2024-01-18 DIAGNOSIS — Z79899 Other long term (current) drug therapy: Secondary | ICD-10-CM | POA: Diagnosis not present

## 2024-01-18 DIAGNOSIS — I5032 Chronic diastolic (congestive) heart failure: Secondary | ICD-10-CM | POA: Diagnosis not present

## 2024-01-18 DIAGNOSIS — R001 Bradycardia, unspecified: Secondary | ICD-10-CM | POA: Diagnosis not present

## 2024-01-18 DIAGNOSIS — I4821 Permanent atrial fibrillation: Secondary | ICD-10-CM | POA: Diagnosis not present

## 2024-01-18 DIAGNOSIS — Z950 Presence of cardiac pacemaker: Secondary | ICD-10-CM

## 2024-01-18 NOTE — Progress Notes (Unsigned)
 Electrophysiology TeleHealth Note      Date:  01/18/2024   ID:  Dashiell, Franchino 13-Nov-1929, MRN [id number]  Location: patient's home  Provider location: [address] [place]  Evaluation Performed: Follow-up visit  PCP:  [name], MD  Cardiologist:    Electrophysiologist:  SK   Chief Complaint:  atrial fib  History of Present Illness:   My location is [facility] . The Patients location is their home. Eric Lambert is a 89 y.o. male who presents via/video  conferencing for a telehealth visit today.  Since last being seen in our clinic for atrial fibrillation-permanent with HFpEF hypertension and a Environmental manager pacemaker with recent hospitalizations for transudative effusions presumed cardiac and noted to have an increased heart rate for which digoxin was initiated with a level 2/10 of 1.0 prompting a decrase in the dose with repeat pending - the patient reports doing better  Alos increased his diuretics with some confusion now on 60 mg day with improvement with less early satiety which we are using as a surrogate for heart failure  The patient denies symptoms of fevers, chills, cough, or new SOB worrisome for COVID 19.   Past Medical History:  Diagnosis Date   Allergy    Arthritis    Atrial fibrillation -permanent    BPH (benign prostatic hyperplasia)    Cancer (HCC)    HX OF SKIN CANCER    CHF (congestive heart failure) (HCC)    resolved after pacemaker - tachycardia induced   Complete heart block (HCC)    Dyspnea    mild   ED (erectile dysfunction)    GERD (gastroesophageal reflux disease)    GLUCOSE INTOLERANCE 10/22/2007   no recent issues   Heart murmur    OSA (obstructive sleep apnea)    NO CPAP    Pacemaker BSX    dual   Pneumonia    HX OF SEVERAL TIMES AS A CHILD    PONV (postoperative nausea and vomiting)    at age 12    Presence of permanent cardiac pacemaker    SUBACUTE BACTERIAL  ENDOCARDITIS 1970s    Past Surgical History:  Procedure Laterality Date   ESOPHAGOGASTRODUODENOSCOPY (EGD) WITH PROPOFOL N/A 01/07/2022   Procedure: ESOPHAGOGASTRODUODENOSCOPY (EGD) WITH PROPOFOL;  Surgeon: Beverley Fiedler, MD;  Location: [facility];  Service: Gastroenterology;  Laterality: N/A;   IMPACTION REMOVAL  01/07/2022   Procedure: IMPACTION REMOVAL;  Surgeon: Beverley Fiedler, MD;  Location: [facility];  Service: Gastroenterology;;   INGUINAL HERNIA REPAIR Bilateral 04/08/2021   Procedure: LAPAROSCOPIC BILATERAL INGUINAL HERNIA REPAIR WITH MESH;  Surgeon: Kinsinger, De Blanch, MD;  Location: [facility];  Service: General;  Laterality: Bilateral;   INSERT / REPLACE / REMOVE PACEMAKER     PACEMAKER GENERATOR CHANGE N/A 05/07/2012   Procedure: PACEMAKER GENERATOR CHANGE;  Surgeon: Duke Salvia, MD;  Location: [facility] C[facility] LAB;  Service: Cardiovascular;  Laterality: N/A;   PACEMAKER PLACEMENT     PARTIAL HIP ARTHROPLASTY     2008   PPM GENERATOR CHANGEOUT N/A 09/16/2023   Procedure: PPM GENERATOR CHANGEOUT;  Surgeon: Duke Salvia, MD;  Location: [facility] INVASIVE CV LAB;  Service: Cardiovascular;  Laterality: N/A;   TOTAL HIP ARTHROPLASTY Left 01/07/2022   Procedure: TOTAL HIP ARTHROPLASTY ANTERIOR APPROACH;  Surgeon: Sheral Apley, MD;  Location: [facility];  Service: Orthopedics;  Laterality: Left;    Current Outpatient Medications  Medication Sig Dispense Refill  apixaban (ELIQUIS) 5 MG TABS tablet Take 1 tablet by mouth twice daily 180 tablet 1   Cholecalciferol (VITAMIN D-3 PO) Take 1 tablet by mouth daily.     Cyanocobalamin (VITAMIN B-12 PO) Take 2 tablets by mouth daily.     digoxin (LANOXIN) 0.125 MG tablet Take 1 tablet by mouth on days Monday-Friday.     furosemide (LASIX) 40 MG tablet Take 1.5 tablets (60 mg total) by mouth in the morning. 45 tablet 5   Multiple Vitamins-Minerals (OCUVITE EYE HEALTH FORMULA PO) Take 1 tablet by mouth in the morning and at bedtime.     potassium  chloride SA (KLOR-CON M) 20 MEQ tablet Take 2 tablets (40 mEq total) by mouth daily. 60 tablet 2   verapamil (CALAN-SR) 180 MG CR tablet Take 1 tablet (180 mg total) by mouth every evening. 30 tablet 2   No current facility-administered medications for this visit.    Allergies:   Patient has no known allergies.   ROS:  Please see the history of present illness.   All other systems are personally reviewed and negative.    Exam:    Vital Signs:  Ht 5\' 10"  (1.778 m)   BMI 20.37 kg/m  ***  Well appearing, alert and conversant, regular work of breathing,  good skin color Eyes- anicteric, neuro- grossly intact, skin- no apparent rash or lesions or cyanosis, mouth- oral mucosa is pink   Labs/Other Tests and Data Reviewed:    Recent Labs: 10/28/2023: B Natriuretic Peptide 243.6 11/03/2023: Magnesium 2.0 11/13/2023: Hemoglobin 13.6; Platelets 166.0 11/30/2023: ALT 14 01/04/2024: BUN 36; Creatinine, Ser 1.21; Potassium 4.5; Sodium 145   Wt Readings from Last 3 Encounters:  01/06/24 142 lb (64.4 kg)  12/29/23 143 lb 12.8 oz (65.2 kg)  12/21/23 142 lb 12.8 oz (64.8 kg)     Other studies personally reviewed: Additional studies/ records that were reviewed today include: ***  Review of the above records today demonstrates: *** Prior radiographs: ***    ASSESSMENT & PLAN:    Atrial fibrillation  Permanent    Pacemaker-[employer]    HFpEF   Bradycardia   Hypertension    Renal function grade 3   Remote endocarditis  Less early satiety.  This was a presumed surrogate for volume overload.  Will continue furosemide at 60 mg a day  . Arrange for a transmission to look for the effectiveness on heart rate control and he will need a repeat digoxin level    Follow-up:  84m    Current medicines are reviewed at length with the patient today.   The patient does not have concerns regarding his medicines.  The following changes were made today:  none--  Labs/ tests ordered  today include: transmission update for atrial fibrillation and digoxin level No orders of the defined types were placed in this encounter.     Today, I have spent 11 minutes with the patient with telehealth technology discussing the above.  Signed, Sherryl Manges, MD  01/18/2024 1:47 PM     [facility] [address] [place] [address] [phone] (office) [phone] (fax) we needed

## 2024-01-18 NOTE — Patient Instructions (Addendum)
 Medication Instructions:  Your physician recommends that you continue on your current medications as directed. Please refer to the Current Medication list given to you today.  *If you need a refill on your cardiac medications before your next appointment, please call your pharmacy*   Lab Work: Dig level in one week If you have labs (blood work) drawn today and your tests are completely normal, you will receive your results only by: MyChart Message (if you have MyChart) OR A paper copy in the mail If you have any lab test that is abnormal or we need to change your treatment, we will call you to review the results.   Testing/Procedures: None ordered.    Follow-Up: At [facility], you and your health needs are our priority.  As part of our continuing mission to provide you with exceptional heart care, we have created designated Provider Care Teams.  These Care Teams include your primary Cardiologist (physician) and Advanced Practice Providers (APPs -  Physician Assistants and Nurse Practitioners) who all work together to provide you with the care you need, when you need it.  We recommend signing up for the patient portal called "MyChart".  Sign up information is provided on this After Visit Summary.  MyChart is used to connect with patients for Virtual Visits (Telemedicine).  Patients are able to view lab/test results, encounter notes, upcoming appointments, etc.  Non-urgent messages can be sent to your provider as well.   To learn more about what you can do with MyChart, go to [email or web address].    Your next appointment:   6 months with Dr Graciela Husbands - we will send you a reminder letter  We will have Kem contact you to assist with device transmission

## 2024-01-18 NOTE — Telephone Encounter (Signed)
 ..  Pt understands that although there may be some limitations with this type of visit, we will take all precautions to reduce any security or privacy concerns.  Pt understands that this will be treated like an in office visit and we will file with pt's insurance, and there may be a patient responsible charge related to this service. ? ?

## 2024-01-18 NOTE — Telephone Encounter (Signed)
 I called the pt to get a transmission from the pt for Dr. Graciela Husbands.

## 2024-01-20 ENCOUNTER — Encounter: Payer: Self-pay | Admitting: Internal Medicine

## 2024-01-27 ENCOUNTER — Ambulatory Visit: Payer: Self-pay

## 2024-01-27 NOTE — Progress Notes (Signed)
 Remote pacemaker transmission.

## 2024-01-28 ENCOUNTER — Encounter: Payer: Self-pay | Admitting: Internal Medicine

## 2024-02-03 ENCOUNTER — Telehealth: Payer: Self-pay

## 2024-02-03 NOTE — Telephone Encounter (Signed)
 See below.  Copied from CRM [id number]. Topic: General - Call Back - No Documentation >> Feb 03, 2024  1:10 PM [place] J wrote: Reason for CRM: Patient stated that Inetta Fermo from the clinic called. No notes were documented, please call patient back.

## 2024-02-08 ENCOUNTER — Ambulatory Visit (INDEPENDENT_AMBULATORY_CARE_PROVIDER_SITE_OTHER)

## 2024-02-08 VITALS — BP 117/77 | HR 63 | Ht 70.0 in | Wt 133.0 lb

## 2024-02-08 DIAGNOSIS — Z0000 Encounter for general adult medical examination without abnormal findings: Secondary | ICD-10-CM

## 2024-02-08 NOTE — Patient Instructions (Signed)
 Mr. Eric Lambert , Thank you for taking time to come for your Medicare Wellness Visit. I appreciate your ongoing commitment to your health goals. Please review the following plan we discussed and let me know if I can assist you in the future.   Referrals/Orders/Follow-Ups/Clinician Recommendations: Please discuss with your provider  This is a list of the screening recommended for you and due dates:  Health Maintenance  Topic Date Due   Zoster (Shingles) Vaccine (1 of 2) Never done   COVID-19 Vaccine (9 - Pfizer risk 2024-25 season) 02/23/2024   Medicare Annual Wellness Visit  02/07/2025   DTaP/Tdap/Td vaccine (2 - Td or Tdap) 05/24/2025   Pneumonia Vaccine  Completed   Flu Shot  Completed   HPV Vaccine  Aged Out    Advanced directives: (Declined) Advance directive discussed with you today. Even though you declined this today, please call our office should you change your mind, and we can give you the proper paperwork for you to fill out.  Next Medicare Annual Wellness Visit scheduled for next year: Yes

## 2024-02-08 NOTE — Progress Notes (Addendum)
 Subjective:   Eric Lambert is a 89 y.o. who presents for a Medicare Wellness preventive visit.  Visit Complete: Virtual I connected with  Eric Lambert on 02/08/24 by a audio enabled telemedicine application and verified that I am speaking with the correct person using two identifiers.  Patient Location: Home  Provider Location: Home Office  I discussed the limitations of evaluation and management by telemedicine. The patient expressed understanding and agreed to proceed.  Vital Signs: Because this visit was a virtual/telehealth visit, some criteria may be missing or patient reported. Any vitals not documented were not able to be obtained and vitals that have been documented are patient reported.  VideoDeclined- This patient declined Librarian, academic. Therefore the visit was completed with audio only.  Persons Participating in Visit: Patient.  AWV Questionnaire: No: Patient Medicare AWV questionnaire was not completed prior to this visit.  Cardiac Risk Factors include: male gender     Objective:    Today's Vitals   02/08/24 1502  BP: 117/77  Pulse: 63  Weight: 133 lb (60.3 kg)  Height: 5\' 10"  (1.778 m)   Body mass index is 19.08 kg/m.     02/08/2024    3:25 PM 10/28/2023   10:00 PM 10/05/2023    4:06 PM 10/05/2023    9:30 AM 10/05/2023    8:35 AM 09/16/2023    6:56 AM 03/27/2023   11:54 AM  Advanced Directives  Does Patient Have a Medical Advance Directive? No No No No No No No  Would patient like information on creating a medical advance directive? No - Patient declined No - Patient declined No - Patient declined   No - Patient declined No - Patient declined    Current Medications (verified) Outpatient Encounter Medications as of 02/08/2024  Medication Sig   apixaban (ELIQUIS) 5 MG TABS tablet Take 1 tablet by mouth twice daily   Cholecalciferol (VITAMIN D-3 PO) Take 1 tablet by mouth daily.   Cyanocobalamin (VITAMIN  B-12 PO) Take 2 tablets by mouth daily.   digoxin (LANOXIN) 0.125 MG tablet Take 1 tablet by mouth on days Monday-Friday.   furosemide (LASIX) 40 MG tablet Take 1.5 tablets (60 mg total) by mouth in the morning.   Multiple Vitamins-Minerals (OCUVITE EYE HEALTH FORMULA PO) Take 1 tablet by mouth in the morning and at bedtime.   potassium chloride SA (KLOR-CON M) 20 MEQ tablet Take 2 tablets (40 mEq total) by mouth daily.   verapamil (CALAN-SR) 180 MG CR tablet Take 1 tablet (180 mg total) by mouth every evening.   No facility-administered encounter medications on file as of 02/08/2024.    Allergies (verified) Patient has no known allergies.   History: Past Medical History:  Diagnosis Date   Allergy    Arthritis    Atrial fibrillation -permanent    BPH (benign prostatic hyperplasia)    Cancer (HCC)    HX OF SKIN CANCER    CHF (congestive heart failure) (HCC)    resolved after pacemaker - tachycardia induced   Complete heart block (HCC)    Dyspnea    mild   ED (erectile dysfunction)    GERD (gastroesophageal reflux disease)    GLUCOSE INTOLERANCE 10/22/2007   no recent issues   Heart murmur    OSA (obstructive sleep apnea)    NO CPAP    Pacemaker BSX    dual   Pneumonia    HX OF SEVERAL TIMES AS A CHILD    PONV (  postoperative nausea and vomiting)    at age 72    Presence of permanent cardiac pacemaker    SUBACUTE BACTERIAL ENDOCARDITIS 1970s   Past Surgical History:  Procedure Laterality Date   ESOPHAGOGASTRODUODENOSCOPY (EGD) WITH PROPOFOL N/A 01/07/2022   Procedure: ESOPHAGOGASTRODUODENOSCOPY (EGD) WITH PROPOFOL;  Surgeon: Beverley Fiedler, MD;  Location: [facility];  Service: Gastroenterology;  Laterality: N/A;   IMPACTION REMOVAL  01/07/2022   Procedure: IMPACTION REMOVAL;  Surgeon: Beverley Fiedler, MD;  Location: [facility];  Service: Gastroenterology;;   INGUINAL HERNIA REPAIR Bilateral 04/08/2021   Procedure: LAPAROSCOPIC BILATERAL INGUINAL HERNIA REPAIR WITH MESH;   Surgeon: Kinsinger, De Blanch, MD;  Location: [facility];  Service: General;  Laterality: Bilateral;   INSERT / REPLACE / REMOVE PACEMAKER     PACEMAKER GENERATOR CHANGE N/A 05/07/2012   Procedure: PACEMAKER GENERATOR CHANGE;  Surgeon: Duke Salvia, MD;  Location: [facility] CATH LAB;  Service: Cardiovascular;  Laterality: N/A;   PACEMAKER PLACEMENT     PARTIAL HIP ARTHROPLASTY     2008   PPM GENERATOR CHANGEOUT N/A 09/16/2023   Procedure: PPM GENERATOR CHANGEOUT;  Surgeon: Duke Salvia, MD;  Location: [facility] INVASIVE CV LAB;  Service: Cardiovascular;  Laterality: N/A;   TOTAL HIP ARTHROPLASTY Left 01/07/2022   Procedure: TOTAL HIP ARTHROPLASTY ANTERIOR APPROACH;  Surgeon: Sheral Apley, MD;  Location: [facility];  Service: Orthopedics;  Laterality: Left;   Family History  Problem Relation Age of Onset   Prostate cancer Father    Diabetes Brother    Prostate cancer Brother    Brain cancer Brother    Lung disease Paternal Grandmother    Heart disease Other        mothers side men- strokes and heart attacks   Colon cancer Neg Hx    Pancreatic cancer Neg Hx    Stomach cancer Neg Hx    Social History   Socioeconomic History   Marital status: Married    Spouse name: Not on file   Number of children: 2   Years of education: Not on file   Highest education level: Not on file  Occupational History   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 2 children. 1 grandkid. 1 greatgrandchild.       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: reading, plays bridge on internet, watch reruns, enjoys talking politics    Social Drivers of Corporate investment banker Strain: Low Risk  (02/08/2024)   Overall Financial Resource Strain (CARDIA)    Difficulty of Paying Living Expenses: Not hard at all  Food Insecurity: No Food Insecurity (02/08/2024)   Hunger Vital Sign    Worried About Running Out of Food in the Last Year: Never true    Ran Out of Food in the Last Year: Never true  Transportation Needs: No Transportation Needs (02/08/2024)   PRAPARE - Administrator, Civil Service (Medical): No    Lack of Transportation (Non-Medical): No  Physical Activity: Insufficiently Active (  02/08/2024)   Exercise Vital Sign    Days of Exercise per Week: 1 day    Minutes of Exercise per Session: 10 min  Stress: No Stress Concern Present (02/08/2024)   [employer] of Occupational Health - Occupational Stress Questionnaire    Feeling of Stress : Only a little  Social Connections: Socially Integrated (02/08/2024)   Social Connection and Isolation Panel [NHANES]    Frequency of Communication with Friends and Family: Not on file    Frequency of Social Gatherings with Friends and Family: More than three times a week    Attends Religious Services: More than 4 times per year    Active Member of [employer] or Organizations: Yes    Attends Engineer, structural: More than 4 times per year    Marital Status: Married    Tobacco Counseling Counseling given: Yes    Clinical Intake:  Pre-visit preparation completed: Yes  Pain : No/denies pain     BMI - recorded: 19.08 Nutritional Status: BMI of 19-24  Normal Nutritional Risks: Other (Comment) Diabetes: No  How often do you need to have someone help you when you read instructions, pamphlets, or other written materials from your doctor or pharmacy?: 1 - Never  Interpreter Needed?: No  Information entered by :: Smith Mince   Activities of Daily Living     02/08/2024    3:13 PM 10/28/2023   10:00 PM  In your present state of health, do  you have any difficulty performing the following activities:  Hearing? 1 0  Comment both ear   Vision? 1 0  Comment last seen Mckaylin 6 mos ago at [facility]   Difficulty concentrating or making decisions? 1 0  Comment per pt believe he is in the early stages of dementia   Walking or climbing stairs? 1   Dressing or bathing? 0   Doing errands, shopping? 1 0  Preparing Food and eating ? Y   Using the Toilet? Y   In the past six months, have you accidently leaked urine? Y   Do you have problems with loss of bowel control? Y   Managing your Medications? Y   Comment wife helps him at time   Managing your Finances? N   Housekeeping or managing your Housekeeping? Y   Comment wife helps     Patient Care Team: [name], MD as PCP - General (Family Medicine) Duke Salvia, MD as PCP - Electrophysiology (Cardiology) Duke Salvia, MD as PCP - Cardiology (Cardiology) Pati Gallo, MD as Consulting Physician (Sports Medicine) Fleeta Emmer, RN as VBCI Care Management  Indicate any recent Medical Services you may have received from other than [facility] providers in the past year (date may be approximate).     Assessment:   This is a routine wellness examination for Eric Lambert.  Hearing/Vision screen No results found.   Goals Addressed             This Visit's Progress    Patient Stated       Like to have a decent garden       Depression Screen     02/10/2024    2:09 PM 11/12/2023   12:20 PM 11/12/2023   12:14 PM 05/09/2022    1:06 PM 02/19/2021    9:20 AM 08/13/2020    3:10 PM 07/07/2019   10:28 AM  PHQ 2/9 Scores  PHQ - 2 Score 0 0 0 0 0 0 0    Fall  Risk     02/10/2024    2:08 PM 12/29/2023   11:20 AM 12/29/2023   11:08 AM 11/12/2023   12:24 PM 03/12/2023    4:33 PM  Fall Risk   Falls in the past year? 1 0 0 0 0  Comment asked pt during 02/08/27 AWV alia t/cma      Number falls in past yr: 0    0  Injury with Fall? 0    0  Risk for fall due to : No  Fall Risks    No Fall Risks  Follow up Falls evaluation completed;Falls prevention discussed    Falls evaluation completed;Education provided    MEDICARE RISK AT HOME:  Medicare Risk at Home Any stairs in or around the home?: No If so, are there any without handrails?: No Home free of loose throw rugs in walkways, pet beds, electrical cords, etc?: Yes Adequate lighting in your home to reduce risk of falls?: Yes Life alert?: No Use of a cane, walker or w/c?: No Grab bars in the bathroom?: Yes Shower chair or bench in shower?: Yes Elevated toilet seat or a handicapped toilet?: Yes  TIMED UP AND GO: Was test performed? No Cognitive Function: 6CIT performed     02/08/2024    3:23 PM  6CIT Screen  What Year? 0 points  What month? 0 points  What time? 3 points  Count back from 20 0 points  Months in reverse 2 points  Repeat phrase 2 points  Total Score 7 points    Immunizations Immunization History  Administered Date(s) Administered   Fluad Quad(high Dose 65+) 08/23/2019, 08/13/2020, 08/12/2021   Influenza Split 08/02/2012   Influenza Whole 09/27/2007, 08/30/2008, 08/16/2009, 08/05/2010   Influenza, High Dose Seasonal PF 10/05/2017, 09/17/2018, 09/07/2022   Influenza,inj,Quad PF,6+ Mos 08/04/2013, 10/04/2014, 10/08/2015, 09/29/2016   Influenza-Unspecified 08/25/2023   PFIZER Comirnaty(Gray Top)Covid-19 Tri-Sucrose Vaccine 08/20/2022   PFIZER(Purple Top)SARS-COV-2 Vaccination 12/09/2019, 12/30/2019, 08/20/2020, 03/25/2021, 09/11/2021, 03/31/2022   Pneumococcal Conjugate-13 05/25/2015   Pneumococcal Polysaccharide-23 10/22/2007   Tdap 05/25/2015   Unspecified SARS-COV-2 Vaccination 08/25/2023    Screening Tests Health Maintenance  Topic Date Due   Zoster Vaccines- Shingrix (1 of 2) Never done   COVID-19 Vaccine (9 - Pfizer risk 2024-25 season) 02/23/2024   Medicare Annual Wellness (AWV)  02/07/2025   DTaP/Tdap/Td (2 - Td or Tdap) 05/24/2025   Pneumonia Vaccine 65+ Years  old  Completed   INFLUENZA VACCINE  Completed   HPV VACCINES  Aged Out    Health Maintenance  Health Maintenance Due  Topic Date Due   Zoster Vaccines- Shingrix (1 of 2) Never done   Health Maintenance Items Addressed: See Nurse Notes  Additional Screening:  Vision Screening: Recommended annual ophthalmology exams for early detection of glaucoma and other disorders of the eye.  Dental Screening: Recommended annual dental exams for proper oral hygiene  Community Resource Referral / Chronic Care Management: CRR required this visit?  No   CCM required this visit?  No     Plan:     I have personally reviewed and noted the following in the patient's chart:   Medical and social history Use of alcohol, tobacco or illicit drugs  Current medications and supplements including opioid prescriptions. Patient is not currently taking opioid prescriptions. Functional ability and status Nutritional status Physical activity Advanced directives List of other physicians Hospitalizations, surgeries, and ER visits in previous 12 months Vitals Screenings to include cognitive, depression, and falls Referrals and appointments  In addition, I  have reviewed and discussed with patient certain preventive protocols, quality metrics, and best practice recommendations. A written personalized care plan for preventive services as well as general preventive health recommendations were provided to patient.     Arta Silence, CMA   02/10/2024   After Visit Summary: (MyChart) Due to this being a telephonic visit, the after visit summary with patients personalized plan was offered to patient via MyChart   Notes:  Pt stated would like ears flush to help with hearing.

## 2024-02-10 NOTE — Progress Notes (Signed)
 I have reviewed and agree with note, evaluation, plan.  I have asked him to schedule him a visit to evaluate his ears and memory concerns with high normal 6 CIT  Tana Conch, MD

## 2024-02-11 ENCOUNTER — Ambulatory Visit: Payer: Self-pay

## 2024-02-11 NOTE — Patient Outreach (Signed)
 Care Coordination   Follow Up Visit Note   02/11/2024 Name: Eric Lambert MRN: [id number] DOB: 1929/07/30  Eric Lambert is a 89 y.o. year old male who sees Durene Cal, Aldine Contes, MD for primary care. I spoke with  [name] by phone today.  What matters to the patients health and wellness today?  Managing heart failure    Goals Addressed               This Visit's Progress     Heart Failure Managment (pt-stated)        Patient Goals/Self Care Activities: -Patient/Caregiver will take medications as prescribed   -Patient/Caregiver will attend all scheduled provider appointments -Patient/Caregiver will call provider office for new concerns or questions   -Weigh daily and record (notify MD with 3 lb weight gain over night or 5 lb in a week) -Follow CHF Action Plan -Adhere to low sodium diet    Patient doing okay. Weight 135 lbs.  RN CM reiterated in depth heart failure and management.  He verbalized understanding. No RN CM concerns.         SDOH assessments and interventions completed:  Yes     Care Coordination Interventions:  Yes, provided   Follow up plan: Follow up call scheduled for April    Encounter Outcome:  Patient Visit Completed   Bary Leriche RN, MSN [facility], [facility] Health RN Care Manager Direct Dial: [phone]  Fax: [phone] Website: [email or web address]

## 2024-02-11 NOTE — Patient Instructions (Signed)
 Visit Information  Thank you for taking time to visit with me today. Please don't hesitate to contact me if I can be of assistance to you.   Following are the goals we discussed today:   Goals Addressed               This Visit's Progress     Heart Failure Managment (pt-stated)        Patient Goals/Self Care Activities: -Patient/Caregiver will take medications as prescribed   -Patient/Caregiver will attend all scheduled provider appointments -Patient/Caregiver will call provider office for new concerns or questions   -Weigh daily and record (notify MD with 3 lb weight gain over night or 5 lb in a week) -Follow CHF Action Plan -Adhere to low sodium diet    Patient doing okay. Weight 135 lbs.  RN CM reiterated in depth heart failure and management.  He verbalized understanding. No RN CM concerns.         Our next appointment is by telephone on 03/17/24 at 1145 am  Please call the care guide team at [phone] if you need to cancel or reschedule your appointment.   If you are experiencing a Mental Health or Behavioral Health Crisis or need someone to talk to, please call the Suicide and Crisis Lifeline: 988   Patient verbalizes understanding of instructions and care plan provided today and agrees to view in MyChart. Active MyChart status and patient understanding of how to access instructions and care plan via MyChart confirmed with patient.     The patient has been provided with contact information for the care management team and has been advised to call with any health related questions or concerns.   Bary Leriche RN, MSN [facility], [facility] Health RN Care Manager Direct Dial: [phone]  Fax: [phone] Website: [email or web address]

## 2024-02-12 ENCOUNTER — Ambulatory Visit (INDEPENDENT_AMBULATORY_CARE_PROVIDER_SITE_OTHER): Admitting: Family Medicine

## 2024-02-12 ENCOUNTER — Encounter: Payer: Self-pay | Admitting: Family Medicine

## 2024-02-12 ENCOUNTER — Telehealth: Payer: Self-pay

## 2024-02-12 ENCOUNTER — Ambulatory Visit

## 2024-02-12 VITALS — BP 120/78 | HR 61 | Temp 97.4°F | Ht 70.0 in | Wt 140.0 lb

## 2024-02-12 DIAGNOSIS — R0602 Shortness of breath: Secondary | ICD-10-CM

## 2024-02-12 DIAGNOSIS — I5033 Acute on chronic diastolic (congestive) heart failure: Secondary | ICD-10-CM | POA: Diagnosis not present

## 2024-02-12 DIAGNOSIS — J90 Pleural effusion, not elsewhere classified: Secondary | ICD-10-CM

## 2024-02-12 DIAGNOSIS — R413 Other amnesia: Secondary | ICD-10-CM

## 2024-02-12 DIAGNOSIS — J9811 Atelectasis: Secondary | ICD-10-CM | POA: Diagnosis not present

## 2024-02-12 DIAGNOSIS — I10 Essential (primary) hypertension: Secondary | ICD-10-CM | POA: Diagnosis not present

## 2024-02-12 DIAGNOSIS — I4821 Permanent atrial fibrillation: Secondary | ICD-10-CM | POA: Diagnosis not present

## 2024-02-12 DIAGNOSIS — R7303 Prediabetes: Secondary | ICD-10-CM | POA: Diagnosis not present

## 2024-02-12 DIAGNOSIS — I700 Atherosclerosis of aorta: Secondary | ICD-10-CM | POA: Diagnosis not present

## 2024-02-12 NOTE — Patient Instructions (Addendum)
 Patient agrees to x-ray and labs to further investigate shortness of breath   Recommended follow up: Return in about 1 month (around 03/14/2024) for followup or sooner if needed.Schedule b4 you leave.

## 2024-02-12 NOTE — Patient Outreach (Signed)
   CM RN Visit Note   02-12-2024 Name: Eric Lambert MRN: [id number]      DOB: 22-Oct-1929  Subjective: Eric Lambert is a 89 y.o. year old male who is a primary care patient of Dr. Tana Conch The patient was referred to the Care Management team for assistance with care management needs subsequent to provider initiation of CM services and plan of care.      An unsuccessful telephone outreach was attempted today to contact the patient about Care Management needs.    Plan: The RNCM and population health manager will continue to reach out to the patient to follow and address questions and concerns. Was unable to leave a message for the patient. Next scheduled visit with RNCM on 03-17-2024.  Alto Denver RN, MSN, CCM Manager [facility], [place]  Direct Number: [phone]

## 2024-02-12 NOTE — Addendum Note (Signed)
 Addended by: Lorn Junes on: 02/12/2024 04:34 PM   Modules accepted: Orders

## 2024-02-12 NOTE — Progress Notes (Signed)
 Phone [phone] In person visit   Subjective:   Eric Lambert is a 89 y.o. year old very pleasant male patient who presents for/with See problem oriented charting Chief Complaint  Patient presents with   fluid in chest    Pt c/o fluid in chest that loose for about 6months.    Past Medical History-  Patient Active Problem List   Diagnosis Date Noted   Bilateral pleural effusion 10/30/2023    Priority: High   Acute on chronic diastolic congestive heart failure (HCC) 10/05/2023    Priority: High   Pleural effusion 10/05/2023    Priority: High   PPM-[employer] 02/07/2009    Priority: High   Permanent atrial fibrillation (HCC) 10/21/2007    Priority: High   Chronic kidney disease, stage 3b (HCC) 10/07/2023    Priority: Medium    Hyperglycemia 11/07/2022    Priority: Medium    Esophageal obstruction due to food impaction     Priority: Medium    Aortic atherosclerosis (HCC) 08/13/2020    Priority: Medium    BPPV (benign paroxysmal positional vertigo) 12/10/2017    Priority: Medium    Bradycardia 02/02/2013    Priority: Medium    Thrombocytopenia (HCC) 08/04/2011    Priority: Medium    Obstructive sleep apnea 10/21/2007    Priority: Medium    Essential hypertension 10/21/2007    Priority: Medium    GERD 10/21/2007    Priority: Medium    BPH associated with nocturia 10/21/2007    Priority: Medium    Erectile dysfunction 05/28/2016    Priority: Low   Macular degeneration 05/28/2016    Priority: Low   Right hip pain 08/04/2013    Priority: Low   Secondary cardiomyopathy (HCC) 02/07/2009    Priority: Low   Osteopenia 10/22/2007    Priority: Low   SUBACUTE BACTERIAL ENDOCARDITIS 10/21/2007    Priority: Low   S/P total left hip arthroplasty 01/07/2022    Priority: 1.   Degenerative disc disease, lumbar 07/18/2019    Priority: 1.   Chronic low back pain 06/09/2018    Priority: 1.   Right knee pain 06/02/2017    Priority: 1.   Hyperbilirubinemia  10/30/2023   Recurrent right pleural effusion 10/28/2023   NSVT (nonsustained ventricular tachycardia) (HCC) 04/30/2023    Medications- reviewed and updated Current Outpatient Medications  Medication Sig Dispense Refill   apixaban (ELIQUIS) 5 MG TABS tablet Take 1 tablet by mouth twice daily 180 tablet 1   Cholecalciferol (VITAMIN D-3 PO) Take 1 tablet by mouth daily.     Cyanocobalamin (VITAMIN B-12 PO) Take 2 tablets by mouth daily.     digoxin (LANOXIN) 0.125 MG tablet Take 1 tablet by mouth on days Monday-Friday.     furosemide (LASIX) 40 MG tablet Take 1.5 tablets (60 mg total) by mouth in the morning. 45 tablet 5   Multiple Vitamins-Minerals (OCUVITE EYE HEALTH FORMULA PO) Take 1 tablet by mouth in the morning and at bedtime.     potassium chloride SA (KLOR-CON M) 20 MEQ tablet Take 2 tablets (40 mEq total) by mouth daily. 60 tablet 2   verapamil (CALAN-SR) 180 MG CR tablet Take 1 tablet (180 mg total) by mouth every evening. 30 tablet 2   No current facility-administered medications for this visit.     Objective:  BP 120/78   Pulse 61   Temp (!) 97.4 F (36.3 C)   Ht 5\' 10"  (1.778 m)   Wt 140 lb (63.5 kg)  SpO2 97%   BMI 20.09 kg/m  Gen: NAD, resting comfortably CV: regular rate no murmurs rubs or gallops Lungs: CTAB no crackles, wheeze, rhonchi Ext: no edema Skin: warm, dry Neuro: walks slowly with cane    Assessment and Plan   # Right-sided heart failure with history of recurrent transudative pleural effusion #hypertension-well-controlled on verapamil and Lasix-continue current medication #Secondary cardiomyopathy-tachycardia induced in the past due to A-fib before rate control S: medication: verapamil 180 mg daily, lasix 60mg  daily   patient with recurrent pleural effusion that improved with diuresis  in November hospitalization but worsened outpatient requiring repeat hospitalization and thoracentesis that ended up being transudate - thought to be cardiac  despite lack of edmea. Patient with underlying heart failure likely as cause. Pulmonology has seen him and pointed to cardiac cause. Dr. Graciela Husbands does not believe electrophysiology cause.   Today -  He reports feeling more shortness of breath as time goes on- thinks likely more shortness of breath today than he was at last visit -he has not been able to get as high a volume on the incentive spirometer in last week or two  -he has tried to mow a little bit but feels winded and more weak  -is at least sleeping better -weight down 3 lbs from last visit here- reports weight at home is staying down -yesterday morning he took 2 of the 40 mg instead of his normal 1.5 of the 40 mg - thinks pehaps mildly better  A/P: 89 year old male with right-sided heart failure also with history of recurrent transudative pleural effusion but doesn't show classic signs of heart failure typically such as leg edema  -Want to reassess effusion with x-ray as well as heart failure with BNP-patient is agreeable -Pulmonology thinks his shortness of breath is likely related to heart failure as well as the transudative pleural effusion likely related to heart failure.  PFTs reassuring.  Dr. Judeth Horn did not think bronchodilators would help patient that she does have some mild air trapping and they jointly opted out -I am going to reach out to Dr. Graciela Husbands for his opinion on referring patient to heart failure clinic -I am also going to ask Dr. Odessa Fleming opinion on cardiac rehab as patient has some substantial deconditioning -With progressive shortness of breath and fatigue will update labs.  He also reports some memory concerns and checking TSH as well as B12 attempted today-could do more formal memory testing in the future -hold lasix dose steady 60 mg daily but may titrate up  #hypertension-well-controlled on verapamil and Lasix-continue current medication  #memory concern- feels he may be losing some memory- will add TSH and B12. I  think stress of illness and deconditioning contributing.   #Ear concerns - he had mentioned at medicare wellness visit but in order to coordinate lab and x-ray at end of day we were unable to complete ear evaluation today   # Atrial fibrillation-cardiology Dr. Graciela Husbands #Bradycardia-patient with pacemaker in place S: Rate controlled with verapamil 180  mg extended release, digoxin 0.125 mg daily Anticoagulated with Eliquis 5 mg twice daily.   Walks with cane  A/P: appropriately anticoagulated and rate controlled- continue current medicine. I do not think a fib is primary cause of shortness of breath     #Prediabetes- updatea a1c  Recommended follow up: Return in about 1 month (around 03/14/2024) for followup or sooner if needed.Schedule b4 you leave. Future Appointments  Date Time Provider Department Center  03/17/2024 11:45 AM Fleeta Emmer, RN [facility] None  03/18/2024  7:00 AM [facility] DEVICE REMOTES [facility]  03/28/2024 11:00 AM [facility] PHLEBOTOMIST [facility] None  03/28/2024 11:40 AM Ladene Artist, MD [facility] None  06/17/2024  7:00 AM [facility] DEVICE REMOTES [facility]  09/16/2024  7:00 AM [facility] DEVICE REMOTES [facility]  12/16/2024  7:00 AM [facility] DEVICE REMOTES [facility]  02/13/2025  3:00 PM [facility] ANNUAL WELLNESS VISIT 1 [facility]  03/17/2025  7:00 AM [facility] DEVICE REMOTES [facility]  06/16/2025  7:00 AM [facility] DEVICE REMOTES [facility]  09/15/2025  7:00 AM [facility] DEVICE REMOTES [facility]    Lab/Order associations:   ICD-10-CM   1. SOB (shortness of breath)  R06.02 DG Chest 2 View    Comprehensive metabolic panel    CBC with Differential/Platelet    Hemoglobin A1c    TSH    Brain natriuretic peptide    Brain natriuretic peptide    TSH    Hemoglobin A1c    CBC with Differential/Platelet    Comprehensive metabolic panel    2. Memory loss   R41.3 TSH    TSH    Vitamin B12    3. Permanent atrial fibrillation (HCC)  I48.21     4. Acute on chronic diastolic congestive heart failure (HCC)  I50.33     5. Pleural effusion  J90     6. Essential hypertension  I10      Time Spent: 45 minutes of total time (4:25 pm- 5 pm, 9:00 pm- 9:10 PM) was spent on the date of the encounter performing the following actions: chart review prior to seeing the patient, obtaining history, performing a medically necessary exam, counseling on the workup and potential treatment plan,- also discussing how heart failure is affectin his shortness of breath and effusion placing orders, and documenting in our EHR.    Return precautions advised.  Tana Conch, MD

## 2024-02-13 LAB — COMPREHENSIVE METABOLIC PANEL
AG Ratio: 1.3 (calc) (ref 1.0–2.5)
ALT: 14 U/L (ref 9–46)
AST: 15 U/L (ref 10–35)
Albumin: 3.7 g/dL (ref 3.6–5.1)
Alkaline phosphatase (APISO): 68 U/L (ref 35–144)
BUN/Creatinine Ratio: 27 (calc) — ABNORMAL HIGH (ref 6–22)
BUN: 30 mg/dL — ABNORMAL HIGH (ref 7–25)
CO2: 22 mmol/L (ref 20–32)
Calcium: 9 mg/dL (ref 8.6–10.3)
Chloride: 108 mmol/L (ref 98–110)
Creat: 1.11 mg/dL (ref 0.70–1.22)
Globulin: 2.8 g/dL (ref 1.9–3.7)
Glucose, Bld: 70 mg/dL (ref 65–99)
Potassium: 4 mmol/L (ref 3.5–5.3)
Sodium: 142 mmol/L (ref 135–146)
Total Bilirubin: 0.8 mg/dL (ref 0.2–1.2)
Total Protein: 6.5 g/dL (ref 6.1–8.1)
eGFR: 62 mL/min/{1.73_m2} (ref >=60)

## 2024-02-13 LAB — CBC WITH DIFFERENTIAL/PLATELET
Absolute Lymphocytes: 1691 {cells}/uL (ref 850–3900)
Absolute Monocytes: 594 {cells}/uL (ref 200–950)
Basophils Absolute: 28 {cells}/uL (ref 0–200)
Basophils Relative: 0.5 %
Eosinophils Absolute: 112 {cells}/uL (ref 15–500)
Eosinophils Relative: 2 %
HCT: 41.8 % (ref 38.5–50.0)
Hemoglobin: 14.3 g/dL (ref 13.2–17.1)
MCH: 31.1 pg (ref 27.0–33.0)
MCHC: 34.2 g/dL (ref 32.0–36.0)
MCV: 90.9 fL (ref 80.0–100.0)
MPV: 12.9 fL — ABNORMAL HIGH (ref 7.5–12.5)
Monocytes Relative: 10.6 %
Neutro Abs: 3175 {cells}/uL (ref 1500–7800)
Neutrophils Relative %: 56.7 %
Platelets: 115 10*3/uL — ABNORMAL LOW (ref 140–400)
RBC: 4.6 10*6/uL (ref 4.20–5.80)
RDW: 14.3 % (ref 11.0–15.0)
Total Lymphocyte: 30.2 %
WBC: 5.6 10*3/uL (ref 3.8–10.8)

## 2024-02-13 LAB — BRAIN NATRIURETIC PEPTIDE: Brain Natriuretic Peptide: 237 pg/mL — ABNORMAL HIGH (ref <100)

## 2024-02-13 LAB — HEMOGLOBIN A1C
Hgb A1c MFr Bld: 5.6 %{Hb} (ref <5.7)
Mean Plasma Glucose: 114 mg/dL
eAG (mmol/L): 6.3 mmol/L

## 2024-02-13 LAB — TSH: TSH: 11.69 m[IU]/L — ABNORMAL HIGH (ref 0.40–4.50)

## 2024-02-15 ENCOUNTER — Encounter: Payer: Self-pay | Admitting: Family Medicine

## 2024-02-15 ENCOUNTER — Other Ambulatory Visit: Payer: Self-pay

## 2024-02-15 DIAGNOSIS — R7989 Other specified abnormal findings of blood chemistry: Secondary | ICD-10-CM

## 2024-02-16 ENCOUNTER — Encounter: Payer: Self-pay | Admitting: Family Medicine

## 2024-02-17 ENCOUNTER — Telehealth: Payer: Self-pay

## 2024-02-17 ENCOUNTER — Other Ambulatory Visit: Payer: Self-pay | Admitting: Family Medicine

## 2024-02-17 MED ORDER — DOXYCYCLINE HYCLATE 100 MG PO TABS: 100.0000 mg | ORAL_TABLET | Freq: Two times a day (BID) | ORAL | 0 refills | Status: AC

## 2024-02-17 NOTE — Telephone Encounter (Signed)
 Copied from CRM [id number]. Topic: Clinical - Medication Refill >> Feb 17, 2024  2:21 PM Gurney Maxin H wrote: Most Recent Primary Care Visit:  Provider: [name]  Department: [facility]  Visit Type: ACUTE  Date: 02/12/2024  Medication: potassium chloride SA (KLOR-CON M) 20 MEQ tablet  Has the patient contacted their pharmacy? Yes, pharmacy states prescription was refused (Agent: If no, request that the patient contact the pharmacy for the refill. If patient does not wish to contact the pharmacy document the reason why and proceed with request.) (Agent: If yes, when and what did the pharmacy advise?)  Is this the correct pharmacy for this prescription? Yes If no, delete pharmacy and type the correct one.  This is the patient's preferred pharmacy:  [facility] [address], [place] - [address] [place] [address] Phone: [phone] Fax: [phone]   Has the prescription been filled recently? No  Is the patient out of the medication? No  Has the patient been seen for an appointment in the last year OR does the patient have an upcoming appointment? Yes  Can we respond through MyChart? No  Agent: Please be advised that Rx refills may take up to 3 business days. We ask that you follow-up with your pharmacy.

## 2024-02-17 NOTE — Telephone Encounter (Signed)
 Rx sent to pharmacy.  Copied from CRM [id number]. Topic: Clinical - Prescription Issue >> Feb 17, 2024  9:04 AM Pascal Lux wrote: Reason for CRM: Patient called stated he saw Dr. Erasmo Leventhal notes regarding sending a prescription of "doxycycline 100 mg twice daily for 7 days ([facility] team please send in if he agrees)"  -- Patient stated he agrees and would like the prescription send to his pharmacy.

## 2024-02-17 NOTE — Progress Notes (Deleted)
 Eric Lambert

## 2024-02-18 ENCOUNTER — Telehealth: Payer: Self-pay | Admitting: Family Medicine

## 2024-02-18 ENCOUNTER — Telehealth: Payer: Self-pay

## 2024-02-18 MED ORDER — POTASSIUM CHLORIDE CRYS ER 20 MEQ PO TBCR: 40.0000 meq | EXTENDED_RELEASE_TABLET | Freq: Every day | ORAL | 2 refills | Status: DC

## 2024-02-18 NOTE — Telephone Encounter (Signed)
 LVM for pt to return call to schedule an appt per pcp, see note.  "[name], MD sent to Arta Silence, CMA He can schedule visit for irrigation/evaluation of ears- if wants to try OTC (available over the counter without a prescription) treatment  Mineral oil for ear full of wax OR can buy debrox OTC (available over the counter without a prescription) Purchase mineral oil from laxative aisle Lay down on your side with ear that is bothering you facing up Use 3-4 drops with a dropper and place in ear for 30 seconds Place cotton swab outside of ear Turn to other side and allow this to drain Repeat 3-4 x a day for up to a week Return to see [place] if not improving within a few days "  Thank you, Ginette Pitman Zane Herald) T/CMA

## 2024-02-18 NOTE — Telephone Encounter (Unsigned)
 Copied from CRM [id number]. Topic: General - Other >> Feb 18, 2024  3:39 PM Eunice Blase wrote: Reason for CRM: Pt returning call regarding ear treatment, pt refused treatment and Dr.'s visit.

## 2024-02-19 NOTE — Telephone Encounter (Signed)
 Called and spoke with pt and he will go have a hearing exam done by an audiologist.

## 2024-02-22 DIAGNOSIS — H61002 Unspecified perichondritis of left external ear: Secondary | ICD-10-CM | POA: Diagnosis not present

## 2024-02-22 DIAGNOSIS — D225 Melanocytic nevi of trunk: Secondary | ICD-10-CM | POA: Diagnosis not present

## 2024-02-22 DIAGNOSIS — Z85828 Personal history of other malignant neoplasm of skin: Secondary | ICD-10-CM | POA: Diagnosis not present

## 2024-02-22 DIAGNOSIS — L570 Actinic keratosis: Secondary | ICD-10-CM | POA: Diagnosis not present

## 2024-02-22 DIAGNOSIS — L821 Other seborrheic keratosis: Secondary | ICD-10-CM | POA: Diagnosis not present

## 2024-02-23 ENCOUNTER — Other Ambulatory Visit: Payer: Self-pay | Admitting: Family Medicine

## 2024-02-23 NOTE — Telephone Encounter (Signed)
 Last Fill: 11/03/23 30 tabs/2 RF  Last OV: 02/12/24 ACUTE Next OV: 02/13/25 AWV  Routing to provider for review/authorization.

## 2024-02-23 NOTE — Telephone Encounter (Signed)
 Copied from CRM [id number]. Topic: Clinical - Medication Refill >> Feb 23, 2024  9:34 AM Almira Coaster wrote: Most Recent Primary Care Visit:  Provider: [name]  Department: [facility]  Visit Type: ACUTE  Date: 02/12/2024  Medication: verapamil (CALAN-SR) 180 MG CR tablet  Has the patient contacted their pharmacy? Yes (Agent: If no, request that the patient contact the pharmacy for the refill. If patient does not wish to contact the pharmacy document the reason why and proceed with request.) (Agent: If yes, when and what did the pharmacy advise?)  Is this the correct pharmacy for this prescription? Yes If no, delete pharmacy and type the correct one.  This is the patient's preferred pharmacy:  [facility] [address], [place] - [address] [place] [address] Phone: [phone] Fax: [phone]    Has the prescription been filled recently? No  Is the patient out of the medication? Yes  Has the patient been seen for an appointment in the last year OR does the patient have an upcoming appointment? Yes  Can we respond through MyChart? Yes  Agent: Please be advised that Rx refills may take up to 3 business days. We ask that you follow-up with your pharmacy.

## 2024-02-24 NOTE — Telephone Encounter (Unsigned)
 Copied from CRM [id number]. Topic: General - Other >> Feb 24, 2024  4:11 PM Shereese L wrote: Reason for CRM: verapamil (CALAN-SR) 180 MG CR tablet  Patient was returning a call in reference to the above medication. Spoke with nurse and was advised that it was routed to the wrong team and that within 48 hours he should be able to pick it up by tomorrow

## 2024-02-24 NOTE — Telephone Encounter (Signed)
 Patient is calling back in regarding a refill he needs a call back regarding it

## 2024-02-25 ENCOUNTER — Other Ambulatory Visit: Payer: Self-pay

## 2024-02-25 MED ORDER — VERAPAMIL HCL ER 180 MG PO TBCR: 180.0000 mg | EXTENDED_RELEASE_TABLET | Freq: Every evening | ORAL | 2 refills | Status: DC

## 2024-02-29 ENCOUNTER — Other Ambulatory Visit (INDEPENDENT_AMBULATORY_CARE_PROVIDER_SITE_OTHER)

## 2024-02-29 ENCOUNTER — Encounter: Payer: Self-pay | Admitting: Family Medicine

## 2024-02-29 DIAGNOSIS — R946 Abnormal results of thyroid function studies: Secondary | ICD-10-CM

## 2024-02-29 DIAGNOSIS — R7989 Other specified abnormal findings of blood chemistry: Secondary | ICD-10-CM

## 2024-02-29 DIAGNOSIS — R413 Other amnesia: Secondary | ICD-10-CM

## 2024-02-29 LAB — T4, FREE: Free T4: 0.87 ng/dL (ref 0.60–1.60)

## 2024-02-29 LAB — TSH: TSH: 7.99 u[IU]/mL — ABNORMAL HIGH (ref 0.35–5.50)

## 2024-02-29 LAB — T3, FREE: T3, Free: 3.2 pg/mL (ref 2.3–4.2)

## 2024-03-01 ENCOUNTER — Encounter: Payer: Self-pay | Admitting: Family Medicine

## 2024-03-01 ENCOUNTER — Telehealth: Payer: Self-pay

## 2024-03-01 LAB — VITAMIN B12: Vitamin B-12: 1940 pg/mL — ABNORMAL HIGH (ref 200–1100)

## 2024-03-01 NOTE — Telephone Encounter (Signed)
 Called and lm on pt vm tcb, when pt calls back please read below message to pt and gather details.

## 2024-03-01 NOTE — Telephone Encounter (Signed)
 See below, the last phone note states pt should be taking 1.5mg  daily. This was sent in by Lupita Leash while pt was awaiting to hear back from Cardiology. Ok to send in for 2 tabs daily?  Copied from CRM [id number]. Topic: Clinical - Prescription Issue >> Mar 01, 2024 10:01 AM Alcus Dad wrote: Reason for CRM: Patient needs furosemide (LASIX) 40 MG tablet with the correct dosage. 2 tablets daily. Needs new prescription

## 2024-03-01 NOTE — Telephone Encounter (Signed)
 I looked through phone/result notes/mychart and found this "lets try Lasix 2 tablets daily for a week and see if that helps any with your shortness of breath. "  Can he give [place] an update on how he feels with shortness of breath with 2 tablets daily? If improved I'm ok sending in at this time 2 tablets daily furosemide #180 with 1 refill- but I also want him to have a follow up with me in 2-4 weeks to recheck him and labs

## 2024-03-02 ENCOUNTER — Telehealth: Payer: Self-pay

## 2024-03-02 DIAGNOSIS — I5032 Chronic diastolic (congestive) heart failure: Secondary | ICD-10-CM

## 2024-03-02 MED ORDER — FUROSEMIDE 40 MG PO TABS: ORAL_TABLET | ORAL | 1 refills | Status: DC

## 2024-03-02 NOTE — Addendum Note (Signed)
 Addended by: Gwenette Greet on: 03/02/2024 09:35 AM   Modules accepted: Orders

## 2024-03-02 NOTE — Addendum Note (Signed)
 Addended by: Gwenette Greet on: 03/02/2024 01:26 PM   Modules accepted: Orders

## 2024-03-02 NOTE — Telephone Encounter (Signed)
 Okay you may continue the higher dose-please send in prescription as per prior note but we also need to have him in for a BMP under heart failure to make sure kidney function adequate and potassium okay on increased dose

## 2024-03-02 NOTE — Telephone Encounter (Signed)
 FYI.  Copied from CRM [id number]. Topic: Clinical - Prescription Issue >> Mar 01, 2024  4:17 PM Martinique E wrote: Reason for CRM: Patient called stating he missed a call from the clinic. Found note in chart from PCP regarding patient's Furosemide medication. Patient stated that he "feels great" with shortness of breath when taking 2 tablets daily, and that he does not have any further issues.

## 2024-03-02 NOTE — Telephone Encounter (Signed)
 Pt returned call, see CRM. Refill sent to pharmacy.

## 2024-03-04 ENCOUNTER — Encounter: Payer: Self-pay | Admitting: Family Medicine

## 2024-03-04 ENCOUNTER — Other Ambulatory Visit (INDEPENDENT_AMBULATORY_CARE_PROVIDER_SITE_OTHER)

## 2024-03-04 ENCOUNTER — Telehealth: Payer: Self-pay | Admitting: Family Medicine

## 2024-03-04 DIAGNOSIS — I5032 Chronic diastolic (congestive) heart failure: Secondary | ICD-10-CM | POA: Diagnosis not present

## 2024-03-04 LAB — BASIC METABOLIC PANEL WITH GFR
BUN: 29 mg/dL — ABNORMAL HIGH (ref 6–23)
CO2: 33 meq/L — ABNORMAL HIGH (ref 19–32)
Calcium: 8.8 mg/dL (ref 8.4–10.5)
Chloride: 102 meq/L (ref 96–112)
Creatinine, Ser: 1.13 mg/dL (ref 0.40–1.50)
GFR: 56.35 mL/min — ABNORMAL LOW (ref >60.00)
Glucose, Bld: 94 mg/dL (ref 70–99)
Potassium: 3.9 meq/L (ref 3.5–5.1)
Sodium: 143 meq/L (ref 135–145)

## 2024-03-04 NOTE — Telephone Encounter (Signed)
 LVM to schedule 1 mo FU

## 2024-03-17 ENCOUNTER — Other Ambulatory Visit: Payer: Self-pay

## 2024-03-18 ENCOUNTER — Ambulatory Visit (INDEPENDENT_AMBULATORY_CARE_PROVIDER_SITE_OTHER): Payer: Medicare HMO

## 2024-03-18 DIAGNOSIS — I495 Sick sinus syndrome: Secondary | ICD-10-CM | POA: Diagnosis not present

## 2024-03-18 LAB — CUP PACEART REMOTE DEVICE CHECK
Battery Remaining Longevity: 114 mo
Battery Remaining Percentage: 100 %
Brady Statistic RV Percent Paced: 66 %
Date Time Interrogation Session: 20250425021100
Implantable Lead Connection Status: 753985
Implantable Lead Connection Status: 753985
Implantable Lead Implant Date: 19960809
Implantable Lead Implant Date: 19960809
Implantable Lead Location: 753859
Implantable Lead Location: 753860
Implantable Lead Model: 4285
Implantable Lead Serial Number: 209144
Implantable Pulse Generator Implant Date: 20241023
Lead Channel Impedance Value: 753 Ohm
Lead Channel Pacing Threshold Amplitude: 1.2 V
Lead Channel Pacing Threshold Pulse Width: 0.4 ms
Lead Channel Setting Pacing Amplitude: 2.5 V
Lead Channel Setting Pacing Pulse Width: 0.4 ms
Lead Channel Setting Sensing Sensitivity: 2.5 mV
Pulse Gen Serial Number: 924391
Zone Setting Status: 755011

## 2024-03-22 ENCOUNTER — Ambulatory Visit (INDEPENDENT_AMBULATORY_CARE_PROVIDER_SITE_OTHER): Admitting: Family Medicine

## 2024-03-22 ENCOUNTER — Encounter: Payer: Self-pay | Admitting: Family Medicine

## 2024-03-22 VITALS — BP 120/72 | HR 67 | Temp 97.0°F | Ht 70.0 in | Wt 135.6 lb

## 2024-03-22 DIAGNOSIS — I10 Essential (primary) hypertension: Secondary | ICD-10-CM

## 2024-03-22 DIAGNOSIS — E785 Hyperlipidemia, unspecified: Secondary | ICD-10-CM

## 2024-03-22 DIAGNOSIS — I5032 Chronic diastolic (congestive) heart failure: Secondary | ICD-10-CM

## 2024-03-22 DIAGNOSIS — I4821 Permanent atrial fibrillation: Secondary | ICD-10-CM | POA: Diagnosis not present

## 2024-03-22 NOTE — Progress Notes (Signed)
 Phone [phone] In person visit   Subjective:   Eric Lambert is a 89 y.o. year old very pleasant male patient who presents for/with See problem oriented charting Chief Complaint  Patient presents with   1 month f/u    Past Medical History-  Patient Active Problem List   Diagnosis Date Noted   Bilateral pleural effusion 10/30/2023    Priority: High   Acute on chronic diastolic congestive heart failure (HCC) 10/05/2023    Priority: High   Pleural effusion 10/05/2023    Priority: High   PPM-[employer] 02/07/2009    Priority: High   Permanent atrial fibrillation (HCC) 10/21/2007    Priority: High   Chronic kidney disease, stage 3b (HCC) 10/07/2023    Priority: Medium    Hyperglycemia 11/07/2022    Priority: Medium    Esophageal obstruction due to food impaction     Priority: Medium    Aortic atherosclerosis (HCC) 08/13/2020    Priority: Medium    BPPV (benign paroxysmal positional vertigo) 12/10/2017    Priority: Medium    Bradycardia 02/02/2013    Priority: Medium    Thrombocytopenia (HCC) 08/04/2011    Priority: Medium    Obstructive sleep apnea 10/21/2007    Priority: Medium    Essential hypertension 10/21/2007    Priority: Medium    GERD 10/21/2007    Priority: Medium    BPH associated with nocturia 10/21/2007    Priority: Medium    Erectile dysfunction 05/28/2016    Priority: Low   Macular degeneration 05/28/2016    Priority: Low   Right hip pain 08/04/2013    Priority: Low   Secondary cardiomyopathy (HCC) 02/07/2009    Priority: Low   Osteopenia 10/22/2007    Priority: Low   SUBACUTE BACTERIAL ENDOCARDITIS 10/21/2007    Priority: Low   S/P total left hip arthroplasty 01/07/2022    Priority: 1.   Degenerative disc disease, lumbar 07/18/2019    Priority: 1.   Chronic low back pain 06/09/2018    Priority: 1.   Right knee pain 06/02/2017    Priority: 1.   Hyperbilirubinemia 10/30/2023   Recurrent right pleural effusion 10/28/2023    NSVT (nonsustained ventricular tachycardia) (HCC) 04/30/2023    Medications- reviewed and updated Current Outpatient Medications  Medication Sig Dispense Refill   apixaban  (ELIQUIS ) 5 MG TABS tablet Take 1 tablet by mouth twice daily 180 tablet 1   Cholecalciferol (VITAMIN D -3 PO) Take 1 tablet by mouth daily.     Cyanocobalamin  (VITAMIN B-12 PO) Take 2 tablets by mouth daily.     digoxin  (LANOXIN ) 0.125 MG tablet Take 1 tablet by mouth on days Monday-Friday.     furosemide  (LASIX ) 40 MG tablet Take 2 tablets daily. 180 tablet 1   Multiple Vitamins-Minerals (OCUVITE EYE HEALTH FORMULA PO) Take 1 tablet by mouth in the morning and at bedtime.     potassium chloride  SA (KLOR-CON  M) 20 MEQ tablet Take 2 tablets (40 mEq total) by mouth daily. 60 tablet 2   verapamil  (CALAN -SR) 180 MG CR tablet Take 1 tablet (180 mg total) by mouth every evening. 30 tablet 2   No current facility-administered medications for this visit.     Objective:  BP 120/72   Pulse 67   Temp (!) 97 F (36.1 C)   Ht 5\' 10"  (1.778 m)   Wt 135 lb 9.6 oz (61.5 kg)   SpO2 97%   BMI 19.46 kg/m  Gen: NAD, resting comfortably CV: RRR no murmurs rubs  or gallops Lungs: CTAB no crackles, wheeze, rhonchi Ext: no edema Skin: warm, dry Neuro: walks with cane- able to get on table without assist    Assessment and Plan   #Social update- daughter very helpful- mowing yard for instance and he appreciates that but also misses it if he doesn't push mow.  -he is considering transitioning out of the home- has a friend at [employer]  #Health maintenance - wants to consider Prevnar 20 (hold off today) and shingrix. Not yet due for Tetanus, Diphtheria, and Pertussis (Tdap)    # Right-sided heart failure with history of recurrent transudative pleural effusion #hypertension #Secondary cardiomyopathy-tachycardia induced in the past due to A-fib before rate control S: medication:  verapamil  180 mg daily, lasix  80 mg daily  -requires 40 meq of potassium daily with this  Patient with recurrent pleural effusion that improved with diuresis in November 2020 for hospitalization.  Required thoracentesis in the past but was transudative and thought related to right heart failure.  Last visit patient reported feeling more shortness of breath as time goes on-felt winded and weak with his regular activities.  He had taken 80 mg instead of 60 mg on the day of visit and felt mildly better and we opted to trial off 80 mg for a few more days to see how he did but he decided he felt so much better he continued this and requested refill which we agreed with - Felt like he was having some memory issues but B12 and TSH largely reassuring although mild TSH elevation at 7.99 -We had also reached out to cardiology about possible CHF clinic referral or cardiac rehab-they essentially wanted to see him back in follow-up before making decision on that plus he made substantial improvement per report on 80 mg daily -His weight is down 5 pounds from last visit . He's actually trying to gain some from calories but not fluid. Reports foods don't taste as good but does well with liquids -breathing had been better in early April on this dose- but feels like perhaps he is more aware of his breathing at this point- he states may be worse but then again he reports some anxiety. Reports able of the do push mower again- has to take some breaks but doesn't have to stop anymore   A/P: right sided CHF appears stable to me with higher dose of lasix  80 mg- continue current medications - remain off lisinopril  for now  =with prior plueral effusion- was stable to improve in march- we opted to hold off  Hypertension well controlled continue current medications   # Atrial fibrillation-cardiology Dr. [name] #Bradycardia-patient with pacemaker in place S: Rate controlled with verapamil  180  mg extended release, digoxin  0.125 mg daily  Anticoagulated with Eliquis  5 mg  twice daily.   Denies recent falls   A/P: appropriately anticoagulated and rate controlled- continue current medicine   #hyperlipidemia #Aortic atherosclerosis S: Medication: None- but LDL under 70 Lab Results  Component Value Date   CHOL 153 10/13/2023   HDL 65.30 10/13/2023   LDLCALC 78 10/13/2023   LDLDIRECT 56.0 11/07/2022   TRIG 50.0 10/13/2023   CHOLHDL 2 10/13/2023   A/P: very mildly high  but prefers to remain off of medicine    # Hyperglycemia/insulin resistance/prediabetes- peak a1c 5.8- better on last few checks Lab Results  Component Value Date   HGBA1C 5.6 02/12/2024   HGBA1C 5.6 11/07/2022   HGBA1C 5.6 05/09/2022   #TSH mildly high- 3 weeks ago back  under 10- t3 and t4 normal  #prior ear fullness- no reported issues- no wax noted today  Recommended follow up: Return in about 6 weeks (around 05/03/2024) for followup or sooner if needed.Schedule b4 you leave. Future Appointments  Date Time Provider Department Center  03/24/2024  8:50 AM Debbie Fails, PA-C [facility]  03/24/2024 11:00 AM Randye Buttner, RN [facility] None  03/28/2024 11:00 AM [facility] PHLEBOTOMIST [facility] None  03/28/2024 11:40 AM Sumner Ends, MD [facility] None  05/06/2024  3:00 PM Almira Jaeger, MD [facility]  06/17/2024  7:00 AM [facility] DEVICE REMOTES [facility]  09/16/2024  7:00 AM [facility] DEVICE REMOTES [facility]  12/16/2024  7:00 AM [facility] DEVICE REMOTES [facility]  02/13/2025  3:00 PM [facility] ANNUAL WELLNESS VISIT 1 [facility]  03/17/2025  7:00 AM [facility] DEVICE REMOTES [facility]  06/16/2025  7:00 AM [facility] DEVICE REMOTES [facility]  09/15/2025  7:00 AM [facility] DEVICE REMOTES [facility]    Lab/Order associations:   ICD-10-CM   1. Chronic diastolic congestive heart failure (HCC)  I50.32     2. Permanent atrial fibrillation (HCC)  I48.21     3. Essential hypertension  I10     4.  Hyperlipidemia, unspecified hyperlipidemia type  E78.5       No orders of the defined types were placed in this encounter.   Return precautions advised.  Clarisa Crooked, MD

## 2024-03-22 NOTE — Patient Instructions (Addendum)
 Glad you are doing well- continue current medications and lets hold off on further medication changes for now as well bloodwork- maybe next time  Recommended follow up: Return in about 6 weeks (around 05/03/2024) for followup or sooner if needed.Schedule b4 you leave.

## 2024-03-23 NOTE — Progress Notes (Signed)
 Cardiology Office Note:  .   Date:  03/23/2024  ID:  Eric Lambert, DOB [birth year], MRN [id number] PCP: Almira Jaeger, MD   HeartCare Providers Cardiologist:  Richardo Chandler, MD Electrophysiologist:  Richardo Chandler, MD {  History of Present Illness: .   Eric Lambert is a 89 y.o. male w/PMHx of  OSA, GERD/esophageal dysmotility Remote hx of endocarditis HFpEF Permanent AFib CHB w/PPM  Had an HF admission Nov 2024, 10/05/2023 > diuresed > discharged 10/08/23, though note cardiology felt further diuresis would be beneficial, pt wanted to go home  Admitted again 10/28/23, recurrent progressive SOB > HF, cards notes report a thoracentesis 10/29/23 (transudative based on LDH/light's criteria) Cardiology had signed off 10/30/23, Dr Veryl Gottron had reviewed Echo from 11/12, felt mitral regurgitation is not severe and not contributing current clinical symptoms, recommended diuresis to euvolemia. Cardiology is re-called 12/9 by hospitlaist, query need of RHC given recurrent pleural effusion.  Did not thin RHC would be helpful > pt/family with concerns of is pacer somehow contributing Suggested consideration of hypoalbuminemia contributing, no overwhelming evidence suggest infection etiology,  reasonable to consider autoimmune workup even though transudative effusion, defer to medical team   He saw Dr. [name] 12/21/23 noted cardiology consultation not feel the effusions were cardiac in nature. Right heart cath was considered but not pursued  Pt reported ongoing SOB, some anorexia Lasix  changed to 80mg  Q morning Dig added for rate control  Saw Dr. [name] via tele visit 01/18/24, dig level came back 1 > dose was reduced pending f/u level Seemed some confusion in his lasix  dose and was taking 60mg  daily He was feeling better Recommended 6 mo follow up but would get transmission to review HR histograms with the dig   02/12/24 w/PMD, recurrent progressive SOB, stable weight,  back on 80mg  lasix  with some improvement Planned to reach out to Dr. [name] for consideration of cardiac rehab, 2/2 substantial deconditioning   Many phone notes since then >> seems on/off the 80mg  dose  03/22/24 : PMD better with 80mg  dosing, weight down further   Today's visit is scheduled as to discuss HF management ? About HF clinic referral ROS:   He comes today alone He is doing well Says that since all of this he has become more aware of his breathing, but not of late, SOB  He feels night-day better then the hospitalizations He has an awareness of his breathing at rest but not SOB and if distracted (like a good hand playing bridge), no awareness of this at all  He walked in from the parking garage today without SOB, and denies DOE with ADLs  No CP, palpitations No near syncope or syncope Walks with a cane and his balance is not perfect but no falls  No bleeding or signs of bleeding   Device information BSci dual chamber PPM implanted 07/03/1995, last gen change 09/16/23   Studies Reviewed: [name]    EKG not done today  DEVICE interrogation done today and reviewed by myself Battery and lead measurements are good Episodes label VT are very brief RVR by morphology, longest 14 seconds HR histograms look ok   repeat limited Echo 12/9 with LVEF 55-60%, no RWMA, normal RV, severe LAE, mod RAE, mod MR, aortic sclerosis, IVC normal   Echo 10/06/23 showed LVEF 60-65%, no RWMA, normal RV, PASP36.2 mmHg, massive LAE, severe RAE, mod MR, mild late systolic prolapse of the middle scallop of the posterior leaflet of the mitral valve, aortic sclerosis  Risk Assessment/Calculations:    Physical Exam:   VS:  There were no vitals taken for this visit.   Wt Readings from Last 3 Encounters:  03/22/24 135 lb 9.6 oz (61.5 kg)  02/12/24 140 lb (63.5 kg)  02/08/24 133 lb (60.3 kg)    GEN: Well nourished, thin, in no acute distress NECK: No JVD; No carotid bruits CARDIAC: irreg-irreg,  no murmurs, rubs, gallops RESPIRATORY:  CTA b/l without rales, wheezing or rhonchi  ABDOMEN: Soft, non-tender, non-distended EXTREMITIES:  No edema; No deformity   PPM site: is stable, no thinning, fluctuation, tethering, no signs of errosion  ASSESSMENT AND PLAN: .    permanent AFib CHA2DS2Vasc is 3, on Eliquis , appropriately dosed (weight/creat) Rate controlled  Dig level today (he took his dose about 0700 this AM)  PPM intact function no programming changes made  HFpEF Recent recurrent hospitalizations with pleural effusions By last cardiology team seeing him, not felt to be cardiac etiology > to consider alternative etiologies. Mod MR on his echo, RV OK, preserved LVEF  No symptoms or exam findings of volume OL today Will update his BMET  Secondary hypercoagulable state 2/2 AFib     Dispo: nack in 6 weeks or so, sooner if needed  Signed, Debbie Fails, PA-C

## 2024-03-24 ENCOUNTER — Telehealth: Payer: Self-pay

## 2024-03-24 ENCOUNTER — Ambulatory Visit: Attending: Physician Assistant | Admitting: Physician Assistant

## 2024-03-24 VITALS — BP 122/62 | HR 70 | Ht 70.0 in | Wt 138.0 lb

## 2024-03-24 DIAGNOSIS — I5030 Unspecified diastolic (congestive) heart failure: Secondary | ICD-10-CM

## 2024-03-24 DIAGNOSIS — I4821 Permanent atrial fibrillation: Secondary | ICD-10-CM | POA: Diagnosis not present

## 2024-03-24 DIAGNOSIS — Z950 Presence of cardiac pacemaker: Secondary | ICD-10-CM

## 2024-03-24 DIAGNOSIS — Z79899 Other long term (current) drug therapy: Secondary | ICD-10-CM

## 2024-03-24 DIAGNOSIS — D6869 Other thrombophilia: Secondary | ICD-10-CM | POA: Diagnosis not present

## 2024-03-24 LAB — CUP PACEART INCLINIC DEVICE CHECK
Date Time Interrogation Session: 20250501091920
Implantable Lead Connection Status: 753985
Implantable Lead Connection Status: 753985
Implantable Lead Implant Date: 19960809
Implantable Lead Implant Date: 19960809
Implantable Lead Location: 753859
Implantable Lead Location: 753860
Implantable Lead Model: 4285
Implantable Lead Serial Number: 209144
Implantable Pulse Generator Implant Date: 20241023
Lead Channel Pacing Threshold Amplitude: 0.9 V
Lead Channel Pacing Threshold Pulse Width: 0.4 ms
Lead Channel Sensing Intrinsic Amplitude: 11 mV
Pulse Gen Serial Number: 924391

## 2024-03-24 LAB — BASIC METABOLIC PANEL WITH GFR
BUN/Creatinine Ratio: 24 (ref 10–24)
BUN: 27 mg/dL (ref 10–36)
CO2: 24 mmol/L (ref 20–29)
Calcium: 9.2 mg/dL (ref 8.6–10.2)
Chloride: 104 mmol/L (ref 96–106)
Creatinine, Ser: 1.11 mg/dL (ref 0.76–1.27)
Glucose: 55 mg/dL — ABNORMAL LOW (ref 70–99)
Potassium: 3.9 mmol/L (ref 3.5–5.2)
Sodium: 146 mmol/L — ABNORMAL HIGH (ref 134–144)
eGFR: 62 mL/min/{1.73_m2} (ref >59)

## 2024-03-24 LAB — DIGOXIN LEVEL: Digoxin, Serum: 1 ng/mL — ABNORMAL HIGH (ref 0.5–0.9)

## 2024-03-24 NOTE — Patient Instructions (Signed)
 Medication Instructions:   Your physician recommends that you continue on your current medications as directed. Please refer to the Current Medication list given to you today.   *If you need a refill on your cardiac medications before your next appointment, please call your pharmacy*   Lab Work:   BMET AND DIGOXIN  TODAY ON FIRST FLOOR    If you have labs (blood work) drawn today and your tests are completely normal, you will receive your results only by: MyChart Message (if you have MyChart) OR A paper copy in the mail If you have any lab test that is abnormal or we need to change your treatment, we will call you to review the results.    Testing/Procedures: NONE ORDERED  TODAY      Follow-Up: At [facility], you and your health needs are our priority.  As part of our continuing mission to provide you with exceptional heart care, our providers are all part of one team.  This team includes your primary Cardiologist (physician) and Advanced Practice Providers or APPs (Physician Assistants and Nurse Practitioners) who all work together to provide you with the care you need, when you need it.   Your next appointment:    6 week(s) ( CONTACT  CASSIE HALL/ ANGELINE HAMMER FOR EP SCHEDULING ISSUES )    Provider:    Mertha Abrahams, PA-C      We recommend signing up for the patient portal called "MyChart".  Sign up information is provided on this After Visit Summary.  MyChart is used to connect with patients for Virtual Visits (Telemedicine).  Patients are able to view lab/test results, encounter notes, upcoming appointments, etc.  Non-urgent messages can be sent to your provider as well.   To learn more about what you can do with MyChart, go to [email or web address].   Other Instructions

## 2024-03-27 ENCOUNTER — Encounter: Payer: Self-pay | Admitting: Internal Medicine

## 2024-03-28 ENCOUNTER — Other Ambulatory Visit: Payer: Medicare HMO

## 2024-03-28 ENCOUNTER — Inpatient Hospital Stay: Payer: Medicare HMO | Attending: Oncology | Admitting: Oncology

## 2024-03-28 ENCOUNTER — Inpatient Hospital Stay: Payer: Self-pay

## 2024-03-28 VITALS — BP 122/61 | HR 69 | Temp 98.1°F | Resp 18 | Ht 70.0 in | Wt 138.4 lb

## 2024-03-28 DIAGNOSIS — I509 Heart failure, unspecified: Secondary | ICD-10-CM | POA: Diagnosis not present

## 2024-03-28 DIAGNOSIS — D649 Anemia, unspecified: Secondary | ICD-10-CM | POA: Insufficient documentation

## 2024-03-28 DIAGNOSIS — D696 Thrombocytopenia, unspecified: Secondary | ICD-10-CM | POA: Insufficient documentation

## 2024-03-28 DIAGNOSIS — R630 Anorexia: Secondary | ICD-10-CM | POA: Diagnosis not present

## 2024-03-28 DIAGNOSIS — I4891 Unspecified atrial fibrillation: Secondary | ICD-10-CM | POA: Insufficient documentation

## 2024-03-28 DIAGNOSIS — G4730 Sleep apnea, unspecified: Secondary | ICD-10-CM | POA: Diagnosis not present

## 2024-03-28 DIAGNOSIS — I110 Hypertensive heart disease with heart failure: Secondary | ICD-10-CM | POA: Insufficient documentation

## 2024-03-28 LAB — CBC WITH DIFFERENTIAL (CANCER CENTER ONLY)
Abs Immature Granulocytes: 0.01 10*3/uL (ref 0.00–0.07)
Basophils Absolute: 0 10*3/uL (ref 0.0–0.1)
Basophils Relative: 0 %
Eosinophils Absolute: 0.1 10*3/uL (ref 0.0–0.5)
Eosinophils Relative: 1 %
HCT: 40.1 % (ref 39.0–52.0)
Hemoglobin: 13.4 g/dL (ref 13.0–17.0)
Immature Granulocytes: 0 %
Lymphocytes Relative: 23 %
Lymphs Abs: 1.3 10*3/uL (ref 0.7–4.0)
MCH: 31.2 pg (ref 26.0–34.0)
MCHC: 33.4 g/dL (ref 30.0–36.0)
MCV: 93.3 fL (ref 80.0–100.0)
Monocytes Absolute: 0.6 10*3/uL (ref 0.1–1.0)
Monocytes Relative: 10 %
Neutro Abs: 3.9 10*3/uL (ref 1.7–7.7)
Neutrophils Relative %: 66 %
Platelet Count: 121 10*3/uL — ABNORMAL LOW (ref 150–400)
RBC: 4.3 MIL/uL (ref 4.22–5.81)
RDW: 14.7 % (ref 11.5–15.5)
WBC Count: 5.8 10*3/uL (ref 4.0–10.5)
nRBC: 0 % (ref 0.0–0.2)

## 2024-03-28 NOTE — Progress Notes (Signed)
  [facility] OFFICE PROGRESS NOTE   Diagnosis: Anemia, thrombocytopenia  INTERVAL HISTORY:   Eric Lambert returns as scheduled.  He reports anorexia, but he eats every day.  No bleeding, fever, or night sweats.  No recent infection. He is followed by cardiology and Dr. Arlene Ben for CHF and atrial fibrillation. Objective:  Vital signs in last 24 hours:  Blood pressure 122/61, pulse 69, temperature 98.1 F (36.7 C), temperature source Temporal, resp. rate 18, height 5\' 10"  (1.778 m), weight 138 lb 6.4 oz (62.8 kg), SpO2 100%.   Lymphatics: No cervical, supraclavicular, or inguinal nodes.  "Shotty "bilateral axillary nodes Resp: Lungs clear bilaterally Cardio: Regular rhythm with premature beats GI: No hepatosplenomegaly Vascular: No leg edema  Lab Results:  Lab Results  Component Value Date   WBC 5.8 03/28/2024   HGB 13.4 03/28/2024   HCT 40.1 03/28/2024   MCV 93.3 03/28/2024   PLT 121 (L) 03/28/2024   NEUTROABS 3.9 03/28/2024    CMP  Lab Results  Component Value Date   NA 146 (H) 03/24/2024   K 3.9 03/24/2024   CL 104 03/24/2024   CO2 24 03/24/2024   GLUCOSE 55 (L) 03/24/2024   BUN 27 03/24/2024   CREATININE 1.11 03/24/2024   CALCIUM 9.2 03/24/2024   PROT 6.5 02/12/2024   ALBUMIN 3.8 11/30/2023   AST 15 02/12/2024   ALT 14 02/12/2024   ALKPHOS 74 11/30/2023   BILITOT 0.8 02/12/2024   GFRNONAA >60 11/03/2023   GFRAA 77 08/13/2020    Medications: I have reviewed the patient's current medications.   Assessment/Plan: History of anemia-mild normocytic Chronic mild thrombocytopenia-normal 03/27/2023 Left total hip replacement February 2023  A-fib on anticoagulation Hypertension Pacemaker Sleep apnea     Disposition: Eric Lambert has a history of mild normocytic anemia.  The hemoglobin has been in the normal range over the past year.  He has chronic mild thrombocytopenia.  The platelet count is not changed significant for many years. I have a  low clinical suspicion for a primary hematologic condition, though he may have early myelodysplasia.  He has multiple comorbid conditions.  He will continue clinical follow-up with Dr. Arlene Ben and cardiology.  He discharged in the hematology clinic.  I am available to see him if he develops a new or progressive hematologic abnormality. Coni Deep, MD  03/28/2024  11:58 AM

## 2024-03-29 ENCOUNTER — Encounter: Payer: Self-pay | Admitting: Internal Medicine

## 2024-03-29 NOTE — Telephone Encounter (Signed)
 error

## 2024-04-21 ENCOUNTER — Telehealth: Payer: Self-pay | Admitting: *Deleted

## 2024-04-21 NOTE — Progress Notes (Unsigned)
 Complex Care Management Care Guide Note  04/21/2024 Name: Eric Lambert MRN: [id number] DOB: Feb 06, 1929  Eric Lambert is a 89 y.o. year old male who is a primary care patient of Almira Jaeger, MD and is actively engaged with the care management team. I reached out to [name] by phone today to assist with re-scheduling  with the RN Case Manager.  Follow up plan: Unsuccessful telephone outreach attempt made. A HIPAA compliant phone message was left for the patient providing contact information and requesting a return call.  Kandis Ormond, CMA [place]  [facility], [facility] Guide Direct Dial: [phone]  Fax: [phone] Website: [email or web address]

## 2024-04-22 NOTE — Progress Notes (Signed)
 Complex Care Management Care Guide Note  04/22/2024 Name: Eric Lambert MRN: [id number] DOB: September 27, 1929  [name] is a 89 y.o. year old male who is a primary care patient of Almira Jaeger, MD and is actively engaged with the care management team. I reached out to [name] by phone today to assist with re-scheduling  with the RN Case Manager.  Follow up plan: Telephone appointment with complex care management team member scheduled for:  04/25/2024  Kandis Ormond, CMA [place]  [facility], [facility] Guide Direct Dial: [phone]  Fax: [phone] Website: [email or web address]

## 2024-04-25 ENCOUNTER — Other Ambulatory Visit: Payer: Self-pay

## 2024-04-25 NOTE — Patient Outreach (Signed)
 Complex Care Management   Visit Note  04/25/2024  Name:  Eric Lambert MRN: [id number] DOB: [birth year]  Situation: Referral received for Complex Care Management related to Heart Failure I obtained verbal consent from Patient.  Visit completed with Patient & RNCM  on the phone  Background:   Past Medical History:  Diagnosis Date   Allergy    Arthritis    Atrial fibrillation -permanent    BPH (benign prostatic hyperplasia)    Cancer (HCC)    HX OF SKIN CANCER    CHF (congestive heart failure) (HCC)    resolved after pacemaker - tachycardia induced   Complete heart block (HCC)    Dyspnea    mild   ED (erectile dysfunction)    GERD (gastroesophageal reflux disease)    GLUCOSE INTOLERANCE 10/22/2007   no recent issues   Heart murmur    OSA (obstructive sleep apnea)    NO CPAP    Pacemaker BSX    dual   Pneumonia    HX OF SEVERAL TIMES AS A CHILD    PONV (postoperative nausea and vomiting)    at age 10    Presence of permanent cardiac pacemaker    SUBACUTE BACTERIAL ENDOCARDITIS 1970s    Assessment: Patient Reported Symptoms:  Cognitive Cognitive Status: Normal speech and language skills, Alert and oriented to person, place, and time, Insightful and able to interpret abstract concepts   Health Maintenance Behaviors: Annual physical exam, Social activities, Healthy diet Health Facilitated by: Healthy diet  Neurological Neurological Review of Symptoms: No symptoms reported Neurological Conditions:  (has hx of BPPV (Benign Paroxysmal Positional Vertigo)) Neurological Management Strategies: Coping strategies  HEENT HEENT Symptoms Reported: No symptoms reported HEENT Conditions: Vision problem(s) Vision Problems:  (macular degeneration) Vision problem(s)  Cardiovascular Cardiovascular Symptoms Reported: No symptoms reported Does patient have uncontrolled Hypertension?: No (well controlled BP with meds - last 4 readings per patient = 119/72, 125/72, 118/68,  120/80) Cardiovascular Conditions: Heart failure, Hypertension, Cardiomyopathy, Dysrhythmia Cardiovascular Management Strategies: Medical device, Medication therapy, Routine screening, Weight management, Adequate rest, Coping strategies, Diet modification Do You Have a Working Readable Scale?: Yes Weight: 138 lb (62.6 kg) Cardiovascular Self-Management Outcome: 3 (uncertain) Cardiovascular Comment: PPM - [employer]  Respiratory Respiratory Symptoms Reported: No symptoms reported Respiratory Conditions: Sleep disordered breathing  Endocrine Patient reports the following symptoms related to hypoglycemia or hyperglycemia : No symptoms reported Is patient diabetic?: No    Gastrointestinal Gastrointestinal Symptoms Reported: No symptoms reported      Genitourinary Genitourinary Symptoms Reported: No symptoms reported Additional Genitourinary Details: diagnosed with Chronic Kidney Disease stage 3b Genitourinary Conditions: Other Other Genitourinary Conditions: BPH  Integumentary Integumentary Symptoms Reported: No symptoms reported    Musculoskeletal Musculoskelatal Symptoms Reviewed: Difficulty walking, Weakness, Unsteady gait Additional Musculoskeletal Details: Suffers from Degenerative Disc Disease to the lumbar region and chronic low back pain, Osteopenia, and also s/p total Left hip arthroplasty. Utilizes cane or walker at all times for ambulation/mobility stabilization Musculoskeletal Conditions: Osteopenia, Mobility limited, Unsteady gait, Back pain Musculoskeletal Management Strategies: Medical device, Adequate rest, Coping strategies, Activity Musculoskeletal Self-Management Outcome: 3 (uncertain) Falls in the past year?: No Number of falls in past year: 1 or less Was there an injury with Fall?: No Fall Risk Category Calculator: 0 Patient Fall Risk Level: Low Fall Risk Patient at Risk for Falls Due to: Impaired balance/gait Fall risk Follow up: Falls prevention discussed   Psychosocial Psychosocial Symptoms Reported: No symptoms reported     Quality of Family Relationships:  supportive, involved, helpful Do you feel physically threatened by others?: No      02/10/2024    2:09 PM  Depression screen PHQ 2/9  Decreased Interest 0  Down, Depressed, Hopeless 0  PHQ - 2 Score 0    Vitals:   04/25/24 1619  BP: 125/72    Medications Reviewed Today     Reviewed by Randye Buttner, RN (Registered Nurse) on 04/25/24 at 1511  Med List Status: <None>   Medication Order Taking? Sig Documenting Provider Last Dose Status Informant  apixaban  (ELIQUIS ) 5 MG TABS tablet [id number]  Take 1 tablet by mouth twice daily Verona Goodwill, MD  Active Self, Pharmacy Records  Cholecalciferol (VITAMIN D -3 PO) [id number]  Take 1 tablet by mouth daily. [provider]  Active Self, Pharmacy Records  Cyanocobalamin  (VITAMIN B-12 PO) [id number]  Take 1 tablet by mouth daily. [provider]  Active Self, Pharmacy Records  digoxin  (LANOXIN ) 0.125 MG tablet [id number]  Take 1 tablet by mouth on days Monday-Friday. Hugh Madura, MD  Active   furosemide  (LASIX ) 40 MG tablet [id number]  Take 2 tablets daily. Almira Jaeger, MD  Active   Multiple Vitamins-Minerals [facility] EYE HEALTH FORMULA PO) [id number]  Take 1 tablet by mouth in the morning and at bedtime. [provider]  Active Self, Pharmacy Records  potassium chloride  SA (KLOR-CON  M) 20 MEQ tablet [id number]  Take 2 tablets (40 mEq total) by mouth daily. Almira Jaeger, MD  Active   verapamil  (CALAN -SR) 180 MG CR tablet [id number]  Take 1 tablet (180 mg total) by mouth every evening. Almira Jaeger, MD  Active             Recommendation:   PCP Follow-up with Dr. Arlene Ben on 6/13 at 3pm, discuss Home health Services for Physical Therapy and Aide for ADLs assistance   Follow Up Plan:   Telephone follow up appointment date/time:  05/17/24 with RN Case Manager Barbra Ley A. Saverio Curling RN, BA,  [facility], CRRN   [facility] Population Health RN Care Manager Direct Dial: [phone]  Fax: [phone]

## 2024-04-25 NOTE — Patient Instructions (Signed)
 Visit Information  Thank you for taking time to visit with me today. Please don't hesitate to contact me if I can be of assistance to you before our next scheduled telephone appointment.  Our next appointment is by telephone on 05/17/24 at 3:15pm  Following is a copy of your care plan:   Goals Addressed             This Visit's Progress    VBCI RN Care Plan       Problems:  Chronic Disease Management support and education needs related to CHF  Goal: Over the next 4 months the Patient will continue to work with RN Care Manager and/or Social Worker to address care management and care coordination needs related to CHF as evidenced by adherence to care management team scheduled appointments      Interventions:   Heart Failure Interventions: Provided education on low sodium diet Advised patient to weigh each morning after emptying bladder Reviewed role of diuretics in prevention of fluid overload and management of heart failure; Discussed the importance of keeping all appointments with provider Provided patient with education about the role of exercise in the management of heart failure Advised patient to discuss Home Health Services including RN, PT/OT ,and Aide with provider Screening for signs and symptoms of depression related to chronic disease state  Assessed social determinant of health barriers   Patient Self-Care Activities:  Attend all scheduled provider appointments Call pharmacy for medication refills 3-7 days in advance of running out of medications Call provider office for new concerns or questions  Perform all self care activities independently  Take medications as prescribed   call office if I gain more than 2 pounds in one day or 5 pounds in one week keep legs up while sitting track weight in diary use salt in moderation watch for swelling in feet, ankles and legs every day bring diary to all appointments develop a rescue plan follow rescue plan if symptoms  flare-up eat more whole grains, fruits and vegetables, lean meats and healthy fats know when to call the doctor:reviewed weight gain of 2 lbs over 24 hrs or 5 lbs over week, increased dyspnea that does not improve with resting, increased swelling to lower legs track symptoms and what helps feel better or worse dress right for the weather, hot or cold  Plan:  The patient has been provided with contact information for the care management team and has been advised to call with any health related questions or concerns.              Patient verbalizes understanding of instructions and care plan provided today and agrees to view in MyChart. Active MyChart status and patient understanding of how to access instructions and care plan via MyChart confirmed with patient.     The patient has been provided with contact information for the care management team and has been advised to call with any health related questions or concerns.   Please call the care guide team at [phone] if you need to cancel or reschedule your appointment.   Please call 1-800-273-TALK (toll free, 24 hour hotline) if you are experiencing a Mental Health or Behavioral Health Crisis or need someone to talk to.  Antar Milks A. Saverio Curling RN, BA, [facility], CRRN [place]  [facility] Population Health RN Care Manager Direct Dial: [phone]  Fax: [phone]

## 2024-04-25 NOTE — Progress Notes (Signed)
 Remote pacemaker transmission.

## 2024-04-29 ENCOUNTER — Other Ambulatory Visit: Payer: Self-pay | Admitting: Internal Medicine

## 2024-04-29 DIAGNOSIS — I4821 Permanent atrial fibrillation: Secondary | ICD-10-CM

## 2024-04-29 NOTE — Telephone Encounter (Signed)
 Eliquis  5mg  refill request received. Patient is 89 years old, weight-62.6kg, Crea-1.11 on 03/24/24, Diagnosis-Afib, and last seen by Mertha Abrahams on 03/24/24. Dose is appropriate based on dosing criteria. Will send in refill to requested pharmacy.

## 2024-05-06 ENCOUNTER — Ambulatory Visit (INDEPENDENT_AMBULATORY_CARE_PROVIDER_SITE_OTHER): Admitting: Family Medicine

## 2024-05-06 ENCOUNTER — Encounter: Payer: Self-pay | Admitting: Family Medicine

## 2024-05-06 VITALS — BP 110/76 | HR 60 | Temp 97.5°F | Ht 70.0 in | Wt 138.8 lb

## 2024-05-06 DIAGNOSIS — I10 Essential (primary) hypertension: Secondary | ICD-10-CM | POA: Diagnosis not present

## 2024-05-06 DIAGNOSIS — I5032 Chronic diastolic (congestive) heart failure: Secondary | ICD-10-CM

## 2024-05-06 DIAGNOSIS — I4821 Permanent atrial fibrillation: Secondary | ICD-10-CM | POA: Diagnosis not present

## 2024-05-06 NOTE — Patient Instructions (Addendum)
 Cardiology mentioned trialing off of digoxin - you can consider this or just directly ask at your next appointment  No other changes from my perspective  Recommended follow up: Return in about 3 months (around 08/06/2024) for followup or sooner if needed.Schedule b4 you leave.

## 2024-05-06 NOTE — Progress Notes (Signed)
 Phone [phone] In person visit   Subjective:   Eric Lambert is a 89 y.o. year old very pleasant male patient who presents for/with See problem oriented charting Chief Complaint  Patient presents with   6 week f/u    Past Medical History-  Patient Active Problem List   Diagnosis Date Noted   Bilateral pleural effusion 10/30/2023    Priority: High   Acute on chronic diastolic congestive heart failure (HCC) 10/05/2023    Priority: High   Pleural effusion 10/05/2023    Priority: High   PPM-[employer] 02/07/2009    Priority: High   Permanent atrial fibrillation (HCC) 10/21/2007    Priority: High   Chronic kidney disease, stage 3b (HCC) 10/07/2023    Priority: Medium    Hyperglycemia 11/07/2022    Priority: Medium    Esophageal obstruction due to food impaction     Priority: Medium    Aortic atherosclerosis (HCC) 08/13/2020    Priority: Medium    BPPV (benign paroxysmal positional vertigo) 12/10/2017    Priority: Medium    Bradycardia 02/02/2013    Priority: Medium    Thrombocytopenia (HCC) 08/04/2011    Priority: Medium    Obstructive sleep apnea 10/21/2007    Priority: Medium    Essential hypertension 10/21/2007    Priority: Medium    GERD 10/21/2007    Priority: Medium    BPH associated with nocturia 10/21/2007    Priority: Medium    Erectile dysfunction 05/28/2016    Priority: Low   Macular degeneration 05/28/2016    Priority: Low   Right hip pain 08/04/2013    Priority: Low   Secondary cardiomyopathy (HCC) 02/07/2009    Priority: Low   Osteopenia 10/22/2007    Priority: Low   SUBACUTE BACTERIAL ENDOCARDITIS 10/21/2007    Priority: Low   S/P total left hip arthroplasty 01/07/2022    Priority: 1.   Degenerative disc disease, lumbar 07/18/2019    Priority: 1.   Chronic low back pain 06/09/2018    Priority: 1.   Right knee pain 06/02/2017    Priority: 1.   Hyperbilirubinemia 10/30/2023   Recurrent right pleural effusion 10/28/2023    NSVT (nonsustained ventricular tachycardia) (HCC) 04/30/2023    Medications- reviewed and updated Current Outpatient Medications  Medication Sig Dispense Refill   apixaban  (ELIQUIS ) 5 MG TABS tablet Take 1 tablet by mouth twice daily 180 tablet 1   Cholecalciferol (VITAMIN D -3 PO) Take 1 tablet by mouth daily.     Cyanocobalamin  (VITAMIN B-12 PO) Take 1 tablet by mouth daily.     digoxin  (LANOXIN ) 0.125 MG tablet Take 1 tablet by mouth on days Monday-Friday.     furosemide  (LASIX ) 40 MG tablet Take 2 tablets daily. 180 tablet 1   Multiple Vitamins-Minerals (OCUVITE EYE HEALTH FORMULA PO) Take 1 tablet by mouth in the morning and at bedtime.     potassium chloride  SA (KLOR-CON  M) 20 MEQ tablet Take 2 tablets (40 mEq total) by mouth daily. 60 tablet 2   verapamil  (CALAN -SR) 180 MG CR tablet Take 1 tablet (180 mg total) by mouth every evening. 30 tablet 2   No current facility-administered medications for this visit.     Objective:  BP 110/76   Pulse 60   Temp (!) 97.5 F (36.4 C)   Ht 5' 10 (1.778 m)   Wt 138 lb 12.8 oz (63 kg)   SpO2 97%   BMI 19.92 kg/m  Gen: NAD, resting comfortably CV: reported as permanent a  fib but rather regular today Lungs: CTAB no crackles, wheeze, rhonchi Ext: minimal edema Skin: warm, dry Neuro: walks with cane    Assessment and Plan   # Mildly elevated TSH but improving at last visit close to 8 down from 11 with normal TSH and T3 and T4-they have option of rechecking versus rechecking in 3 months and we jointly agreed to 3 months  # Atrial fibrillation-cardiology Dr. [name] #Bradycardia-patient with pacemaker in place S: Rate controlled with verapamil  180  mg extended release, digoxin  0.125 mg daily- digoxin  levels slightly high   Anticoagulated with Eliquis  5 mg twice daily.   A/P: appropriately anticoagulated and rate controlled- continue current medicine    # Right-sided heart failure with history of recurrent transudative pleural  effusion #hypertension #Secondary cardiomyopathy-tachycardia induced in the past due to A-fib before rate control S: medication:  verapamil  180 mg daily, lasix  80 mg daily with potassium , digoxin  0.125 mg -shortness of breath with activity at times and other times does ok . Energy overall low.  A/P: right sided CHF/cardiomyopathy- euvolemic with stable weight and edema- continue current medications - other than cardiology advised stopping digoxin  with high levels- he is considering after discussion today Hypertension well controlled continue current medications   #Thrombocytopenia-chronic mild issue. Also with anemia- has seen Dr. Scherrie Curt 03/28/24 and released unless significant worsening  Recommended follow up: Return in about 3 months (around 08/06/2024) for followup or sooner if needed.Schedule b4 you leave. Future Appointments  Date Time Provider Department Center  05/13/2024 10:05 AM Debbie Fails, PA-C [facility]  05/17/2024  3:15 PM Randye Buttner, RN [facility] None  06/17/2024  7:00 AM [facility] DEVICE REMOTES [facility]  08/10/2024 11:20 AM Almira Jaeger, MD [facility]  09/16/2024  7:00 AM [facility] DEVICE REMOTES [facility]  12/16/2024  7:00 AM [facility] DEVICE REMOTES [facility]  02/13/2025  3:00 PM [facility] ANNUAL WELLNESS VISIT 1 [facility]  03/17/2025  7:00 AM [facility] DEVICE REMOTES [facility]  06/16/2025  7:00 AM [facility] DEVICE REMOTES [facility]  09/15/2025  7:00 AM [facility] DEVICE REMOTES [facility]    Lab/Order associations:   ICD-10-CM   1. Chronic diastolic congestive heart failure (HCC)  I50.32     2. Permanent atrial fibrillation (HCC)  I48.21     3. Essential hypertension  I10       No orders of the defined types were placed in this encounter.   Return precautions advised.  Clarisa Crooked, MD

## 2024-05-12 NOTE — Progress Notes (Signed)
 Cardiology Office Note:  .   Date:  05/12/2024  ID:  Eric Lambert, DOB Nov 16, 1929, MRN [id number] PCP: Almira Jaeger, MD  [facility] Providers Cardiologist:  Richardo Chandler, MD Electrophysiologist:  Richardo Chandler, MD {  History of Present Illness: .   Eric Lambert is a 89 y.o. male w/PMHx of  OSA, GERD/esophageal dysmotility Remote hx of endocarditis HFpEF Permanent AFib CHB w/PPM  Had an HF admission Nov 2024, 10/05/2023 > diuresed > discharged 10/08/23, though note cardiology felt further diuresis would be beneficial, pt wanted to go home  Admitted again 10/28/23, recurrent progressive SOB > HF, cards notes report a thoracentesis 10/29/23 (transudative based on LDH/light's criteria) Cardiology had signed off 10/30/23, Dr Veryl Gottron had reviewed Echo from 11/12, felt mitral regurgitation is not severe and not contributing current clinical symptoms, recommended diuresis to euvolemia. Cardiology is re-called 12/9 by hospitlaist, query need of RHC given recurrent pleural effusion.  Did not thin RHC would be helpful > pt/family with concerns of is pacer somehow contributing Suggested consideration of hypoalbuminemia contributing, no overwhelming evidence suggest infection etiology,  reasonable to consider autoimmune workup even though transudative effusion, defer to medical team   He saw Dr. [name] 12/21/23 noted cardiology consultation not feel the effusions were cardiac in nature. Right heart cath was considered but not pursued  Pt reported ongoing SOB, some anorexia Lasix  changed to 80mg  Q morning Dig added for rate control  Saw Dr. [name] via tele visit 01/18/24, dig level came back 1 > dose was reduced pending f/u level Seemed some confusion in his lasix  dose and was taking 60mg  daily He was feeling better Recommended 6 mo follow up but would get transmission to review HR histograms with the dig   02/12/24 w/PMD, recurrent progressive SOB, stable weight,  back on 80mg  lasix  with some improvement Planned to reach out to Dr. [name] for consideration of cardiac rehab, 2/2 substantial deconditioning   Many phone notes since then >> seems on/off the 80mg  dose  03/22/24 : PMD better with 80mg  dosing, weight down further  I saw him 03/24/24 He comes today alone He is doing well Says that since all of this he has become more aware of his breathing, but not of late, SOB He feels night-day better then the hospitalizations He has an awareness of his breathing at rest but not SOB and if distracted (like a good hand playing bridge), no awareness of this at all  He walked in from the parking garage today without SOB, and denies DOE with ADLs  No CP, palpitations No near syncope or syncope Walks with a cane and his balance is not perfect but no falls No bleeding or signs of bleeding  Felt volume stable Labs update No changes made, planned for early f/u to evaluate clinical stability  Today's visit is scheduled as planned follow up ROS:   He generally feels stable on the current medication regime, feels like he is being seen too often between his PMD and our visit. No CP, no near syncope or syncope The vast majority of our visit today is discussion on my recommendation that we stop his digoxin   He found the recommendation unreasonable and frankly offensive I explained that his digoxin  level was high on a fairly low dose already and then potentially harmful, particularly given his age, felt safest to stop it He explained that he has knowledge in medications and dosing, and never really was comfortable that he was not taking the medicine on  the weekends interrupting steady state And last week on his own started taking 1 tab alternating days with 1/2 tab (7 days/week). There was much discussion and in the end:  He was very firm that there was benefit in staying on digoxin  That there was likely a negative impact on skipping days His goal for his dig  level would be 0.9 or close to it That he was not certain that he wanted to see me any longer Did not want to be seen so often between his PMD and this office  We landed on Digoxin  0.125 1/2 tab (0.0625mg ) daily  (total 0.4375mg  in a week) (from 0.625mg  total in a week at the previous prescribed dose)  Dig level in 2-3 weeks (advised not to take the dig that morning until after the lab was drawn) Office visit in 6 mo   Device information BSci dual chamber PPM implanted 07/03/1995, last gen change 09/16/23   Studies Reviewed: [name]    EKG not done today  DEVICE interrogation done today and reviewed by myself Not checked today given stable findings by his in clinic check last month   repeat limited Echo 12/9 with LVEF 55-60%, no RWMA, normal RV, severe LAE, mod RAE, mod MR, aortic sclerosis, IVC normal   Echo 10/06/23 showed LVEF 60-65%, no RWMA, normal RV, PASP36.2 mmHg, massive LAE, severe RAE, mod MR, mild late systolic prolapse of the middle scallop of the posterior leaflet of the mitral valve, aortic sclerosis    Risk Assessment/Calculations:    Physical Exam:   VS:  There were no vitals taken for this visit.   Wt Readings from Last 3 Encounters:  05/06/24 138 lb 12.8 oz (63 kg)  04/25/24 138 lb (62.6 kg)  03/28/24 138 lb 6.4 oz (62.8 kg)    GEN: Well nourished, thin, in no acute distress NECK: No JVD; No carotid bruits CARDIAC: irreg-irreg, no murmurs, rubs, gallops RESPIRATORY: CTA b/l without rales, wheezing or rhonchi  ABDOMEN: Soft, non-tender, non-distended EXTREMITIES: trace if any edema  PPM site: is stable, no thinning, fluctuation, tethering, no signs of errosion  ASSESSMENT AND PLAN: .    permanent AFib CHA2DS2Vasc is 3, on Eliquis , appropriately dosed (weight/creat) Rate controlled  As above: He was very firm that there was benefit in staying on digoxin  That there was likely a negative impact on skipping days His goal for his dig level would be 0.9 or  close to it (unhappy with my suggestion that low-mid therapeutic range would be best) That he was not certain that he wanted to see me any longer Did not want to be seen so often between his PMD and this office, has too many visits  We landed on Digoxin  0.125 1/2 tab (0.0625mg ) daily  (total 0.4375mg  in a week) (from 0.625mg  total in a week at the previous prescribed dose)  Dig level in 2-3 weeks (advised not to take the dig that morning until after the lab was drawn) Office visit in 6 mo   PPM Not interrogated today  HFpEF Recent recurrent hospitalizations with pleural effusions By last cardiology team seeing him, not felt to be cardiac etiology > to consider alternative etiologies. Mod MR on his echo, RV OK, preserved LVEF No symptoms or exam findings of volume OL today   Secondary hypercoagulable state 2/2 AFib     Dispo: back in 6 mo, with another EP APP, he is aware he will need to be established with a new EP MD, we can see him  sooner if needed  Signed, Debbie Fails, PA-C

## 2024-05-13 ENCOUNTER — Ambulatory Visit: Attending: Physician Assistant | Admitting: Physician Assistant

## 2024-05-13 VITALS — BP 112/62 | HR 64 | Ht 70.0 in | Wt 137.0 lb

## 2024-05-13 DIAGNOSIS — Z79899 Other long term (current) drug therapy: Secondary | ICD-10-CM

## 2024-05-13 DIAGNOSIS — Z950 Presence of cardiac pacemaker: Secondary | ICD-10-CM | POA: Diagnosis not present

## 2024-05-13 DIAGNOSIS — I5032 Chronic diastolic (congestive) heart failure: Secondary | ICD-10-CM

## 2024-05-13 DIAGNOSIS — D6869 Other thrombophilia: Secondary | ICD-10-CM

## 2024-05-13 DIAGNOSIS — I4821 Permanent atrial fibrillation: Secondary | ICD-10-CM

## 2024-05-13 MED ORDER — DIGOXIN 125 MCG PO TABS: 0.0625 mg | ORAL_TABLET | Freq: Every day | ORAL | 2 refills | Status: AC

## 2024-05-13 NOTE — Patient Instructions (Signed)
 Medication Instructions:   START TAKING: DIGOXIN  0.0625 MG ( HALF TABLET OF 0.125 MG )  ONCE A DAY      *If you need a refill on your cardiac medications before your next appointment, please call your pharmacy*    Lab Work:  PLEASE GO DOWN STAIRS  [facility]  FIRST FLOOR   ( GET OFF ELEVATORS WALK TOWARDS WAITING AREA LAB LOCATED BY PHARMACY):  RETURN IN 3 WEEKS  FOR DIGOXIN       If you have labs (blood work) drawn today and your tests are completely normal, you will receive your results only by: MyChart Message (if you have MyChart) OR A paper copy in the mail If you have any lab test that is abnormal or we need to change your treatment, we will call you to review the results.    Testing/Procedures: NONE ORDERED  TODAY    Follow-Up: At [facility], you and your health needs are our priority.  As part of our continuing mission to provide you with exceptional heart care, our providers are all part of one team.  This team includes your primary Cardiologist (physician) and Advanced Practice Providers or APPs (Physician Assistants and Nurse Practitioners) who all work together to provide you with the care you need, when you need it.  Your next appointment:    6 month(s)   Provider:    You may see or one of the following Advanced Practice Providers on your designated Care Team:   Joycelyn Noa [place], [place]            Creighton Doffing, NP     We recommend signing up for the patient portal called MyChart.  Sign up information is provided on this After Visit Summary.  MyChart is used to connect with patients for Virtual Visits (Telemedicine).  Patients are able to view lab/test results, encounter notes, upcoming appointments, etc.  Non-urgent messages can be sent to your provider as well.   To learn more about what you can do with MyChart, go to [email or web address].   Other Instructions

## 2024-05-17 ENCOUNTER — Other Ambulatory Visit: Payer: Self-pay | Admitting: Family Medicine

## 2024-05-17 ENCOUNTER — Other Ambulatory Visit: Payer: Self-pay

## 2024-05-30 ENCOUNTER — Ambulatory Visit: Payer: Self-pay

## 2024-06-02 ENCOUNTER — Other Ambulatory Visit: Payer: Self-pay | Admitting: Family Medicine

## 2024-06-03 ENCOUNTER — Other Ambulatory Visit: Payer: Self-pay | Admitting: Family Medicine

## 2024-06-03 NOTE — Telephone Encounter (Signed)
 Copied from CRM [id number]. Topic: Clinical - Medication Refill >> Jun 03, 2024  8:09 AM Harlene ORN wrote: Medication: verapamil  (CALAN -SR) 180 MG CR tablet   Has the patient contacted their pharmacy? Yes (Agent: If no, request that the patient contact the pharmacy for the refill. If patient does not wish to contact the pharmacy document the reason why and proceed with request.) (Agent: If yes, when and what did the pharmacy advise?)  This is the patient's preferred pharmacy:  [facility] [address] [place] - [address] [place] [address] Phone: [phone] Fax: [phone]  [facility] [address] [place] [address] Phone: [phone] Fax: [phone]  Is this the correct pharmacy for this prescription? Yes If no, delete pharmacy and type the correct one.   Has the prescription been filled recently? No  Is the patient out of the medication? Yes  Has the patient been seen for an appointment in the last year OR does the patient have an upcoming appointment? Yes  Can we respond through MyChart? Yes  Agent: Please be advised that Rx refills may take up to 3 business days. We ask that you follow-up with your pharmacy.  Patient has sent a refill request before this. Prior Authorization is needed.

## 2024-06-06 DIAGNOSIS — Z79899 Other long term (current) drug therapy: Secondary | ICD-10-CM | POA: Diagnosis not present

## 2024-06-07 ENCOUNTER — Ambulatory Visit: Payer: Self-pay | Admitting: Physician Assistant

## 2024-06-07 LAB — DIGOXIN LEVEL: Digoxin, Serum: 0.6 ng/mL (ref 0.5–0.9)

## 2024-06-12 ENCOUNTER — Other Ambulatory Visit: Payer: Self-pay | Admitting: Family Medicine

## 2024-06-13 ENCOUNTER — Telehealth: Payer: Self-pay | Admitting: *Deleted

## 2024-06-13 NOTE — Telephone Encounter (Signed)
 Lvm to call back for results

## 2024-06-13 NOTE — Telephone Encounter (Signed)
 Patient is returning call.

## 2024-06-13 NOTE — Telephone Encounter (Signed)
Patient is calling to follow up. Please advise.

## 2024-06-14 DIAGNOSIS — H353132 Nonexudative age-related macular degeneration, bilateral, intermediate dry stage: Secondary | ICD-10-CM | POA: Diagnosis not present

## 2024-06-14 DIAGNOSIS — Z961 Presence of intraocular lens: Secondary | ICD-10-CM | POA: Diagnosis not present

## 2024-06-17 ENCOUNTER — Ambulatory Visit: Payer: Medicare HMO

## 2024-06-17 DIAGNOSIS — I495 Sick sinus syndrome: Secondary | ICD-10-CM

## 2024-06-17 LAB — CUP PACEART REMOTE DEVICE CHECK
Battery Remaining Longevity: 114 mo
Battery Remaining Percentage: 100 %
Brady Statistic RV Percent Paced: 67 %
Date Time Interrogation Session: 20250725021100
Implantable Lead Connection Status: 753985
Implantable Lead Connection Status: 753985
Implantable Lead Implant Date: 19960809
Implantable Lead Implant Date: 19960809
Implantable Lead Location: 753859
Implantable Lead Location: 753860
Implantable Lead Model: 4285
Implantable Lead Serial Number: 209144
Implantable Pulse Generator Implant Date: 20241023
Lead Channel Impedance Value: 743 Ohm
Lead Channel Pacing Threshold Amplitude: 1.3 V
Lead Channel Pacing Threshold Pulse Width: 0.4 ms
Lead Channel Setting Pacing Amplitude: 2.5 V
Lead Channel Setting Pacing Pulse Width: 0.4 ms
Lead Channel Setting Sensing Sensitivity: 2.5 mV
Pulse Gen Serial Number: 924391
Zone Setting Status: 755011

## 2024-06-17 NOTE — Telephone Encounter (Signed)
 Pt is returning call to nurse Please call back from 12-1

## 2024-06-17 NOTE — Telephone Encounter (Signed)
 Left msg for pt to return call.

## 2024-06-23 ENCOUNTER — Ambulatory Visit: Payer: Self-pay | Admitting: Cardiology

## 2024-06-24 ENCOUNTER — Ambulatory Visit: Payer: Self-pay

## 2024-06-30 ENCOUNTER — Ambulatory Visit: Payer: Self-pay

## 2024-07-14 ENCOUNTER — Telehealth: Payer: Self-pay | Admitting: Internal Medicine

## 2024-07-14 NOTE — Telephone Encounter (Signed)
 Pt is experiencing a definite feeling of warmth (heat flash like) a few times a month and it has him concerned so he'd like for his transmissions to be looks at from the past 2 months to see if there's anything alarming. Please advise

## 2024-07-14 NOTE — Telephone Encounter (Signed)
 Called patient back to discuss results from transmissions encompassing past 3 months  No answer. Left detailed message with results from transmissions   Gave device clinic number if patient had any additional questions or concerns

## 2024-07-15 ENCOUNTER — Other Ambulatory Visit: Payer: Self-pay | Admitting: Family Medicine

## 2024-07-15 MED ORDER — POTASSIUM CHLORIDE CRYS ER 20 MEQ PO TBCR: 40.0000 meq | EXTENDED_RELEASE_TABLET | Freq: Every day | ORAL | 0 refills | Status: DC

## 2024-07-15 NOTE — Telephone Encounter (Signed)
 There is also a my chart message from patient with a particular event from yesterday. We are waiting on his call back to review and discuss further.  See My chart encounter.

## 2024-07-15 NOTE — Telephone Encounter (Signed)
 LM on patient VM with [employer] tech support number to troubleshoot his connection and to call us  back when this is completed.

## 2024-07-15 NOTE — Telephone Encounter (Signed)
 Copied from CRM [id number]. Topic: Clinical - Medication Refill >> Jul 15, 2024 10:23 AM Tinnie C wrote: Medication:  potassium chloride  SA (KLOR-CON  M) 20 MEQ tablet   Has the patient contacted their pharmacy? No (Agent: If no, request that the patient contact the pharmacy for the refill. If patient does not wish to contact the pharmacy document the reason why and proceed with request.) (Agent: If yes, when and what did the pharmacy advise?)  This is the patient's preferred pharmacy:  [facility] [address] [place] - [address] [place] [address] Phone: [phone] Fax: [phone]  Is this the correct pharmacy for this prescription? Yes If no, delete pharmacy and type the correct one.   Has the prescription been filled recently? Yes  Is the patient out of the medication? Will be out Sunday  Has the patient been seen for an appointment in the last year OR does the patient have an upcoming appointment? Yes  Can we respond through MyChart? Yes  Agent: Please be advised that Rx refills may take up to 3 business days. We ask that you follow-up with your pharmacy.

## 2024-07-18 ENCOUNTER — Ambulatory Visit: Payer: Self-pay

## 2024-07-18 ENCOUNTER — Telehealth: Payer: Self-pay | Admitting: Physician Assistant

## 2024-07-18 NOTE — Telephone Encounter (Signed)
 Noted. Patient scheduled for 08/10/2024. Does patient need to be seen sooner?

## 2024-07-18 NOTE — Telephone Encounter (Signed)
 FYI Only or Action Required?: Action required by provider: update on patient condition.  Patient was last seen in primary care on 05/06/2024 by Katrinka Garnette KIDD, MD.  Called Nurse Triage reporting Hot Flashes and Excessive Sweating.  Symptoms began about a month ago.  Interventions attempted: Nothing.  Symptoms are: unchanged.  Triage Disposition: See Physician Within 24 Hours  Patient/caregiver understands and will follow disposition?: No, refuses disposition Patient would like to be evaluated at his next appointment on 08/10/24   Copied from CRM #[id number]. Topic: Clinical - Red Word Triage >> Jul 18, 2024  1:50 PM Tysheama G wrote: [facility] that prompted transfer to Nurse Triage: patient stated he's been getting hot flashes or just feeling really hot and feeling strange(he cant really describe the strange feeling) but he's been having this for a month now. he does have a Radiographer, therapeutic but he stated it hasn't picked up those symptoms and he also had a heighten pulse.SABRA also stated he gets real sweaty during the episodes Reason for Disposition  Nursing judgment or information in reference  Answer Assessment - Initial Assessment Questions 1. REASON FOR CALL: What is your main concern right now?     Reports hot flashes sweating, not feeling right, heart rate over 100 during episodes 2. ONSET: When did the episodes start?     One month ago  3. SEVERITY: How bad is the episode?     Episodes last from brief (seconds) to up to no more than five minutes Frequency of episodes vary, but about one-two times per week 4. FUNCTIONAL IMPAIRMENT: How have things been going for you overall? Have you had more difficulty than usual doing your normal daily activities? (e.g., self-care, school, work, interactions)     denies 5. RELIEVING AND AGGRAVATING FACTORS: What makes it better or worse? (e.g., certain activities, rest)     No trigger  6. FEVER: Do you have a fever?     denies 7.  OTHER SYMPTOMS: Do you have any other new symptoms?     Denies chest pain and shortness of breath 8. TREATMENTS AND RESPONSE: What have you done so far to try to make this better? What medicines have you used?     Taking all regular medications 9. PREGNANCY: Is there any chance you are pregnant? When was your last menstrual period?     N/A 10. Denies chest pain, increased shortness of breath, denies swelling  Encouraged patient to stay hydrated, take medication as scheduled, and record date and time of episodes  Patient recited BP readings and all are WNL   Patient does not want to be seen today, would like to wait for scheduled visit on 08/10/2024  Protocols used: No Guideline Available-A-AH

## 2024-07-18 NOTE — Telephone Encounter (Signed)
 Can we place him high priority on cancellation list and move up with any openings?

## 2024-07-18 NOTE — Telephone Encounter (Signed)
 Pt c/o medication issue:  1. Name of Medication: digoxin  (LANOXIN ) 0.125 MG tablet   2. How are you currently taking this medication (dosage and times per day)? As written   3. Are you having a reaction (difficulty breathing--STAT)? No   4. What is your medication issue? Pt called in asking to speak with nurse or provider about the dose of this medication.    Pt called in asking also when he should f/u. I told him the note says 6 mon but he would like to speak to nurse first.   He states their phone only works between 12p-1pm. He Designer, television/film set works better.

## 2024-07-19 NOTE — Telephone Encounter (Signed)
 LVM to schedule sooner visit with [facility]. Please transfer to [employer] if patient calls back

## 2024-07-24 ENCOUNTER — Other Ambulatory Visit: Payer: Self-pay | Admitting: Family Medicine

## 2024-07-26 ENCOUNTER — Telehealth: Payer: Self-pay | Admitting: Family Medicine

## 2024-07-26 NOTE — Telephone Encounter (Signed)
 Patient returned call. Declined to schedule sooner appointment than 08/10/24.

## 2024-07-30 ENCOUNTER — Other Ambulatory Visit: Payer: Self-pay | Admitting: Family Medicine

## 2024-08-04 ENCOUNTER — Telehealth: Payer: Self-pay | Admitting: Family Medicine

## 2024-08-04 NOTE — Telephone Encounter (Signed)
 Left vm for pt requesting call back.    Copied from CRM #[id number]. Topic: Clinical - Prescription Issue >> Aug 04, 2024 11:45 AM Ivette P wrote: Reason for CRM: Pt would like to know if will have more refills on medication . Says he should have 2 more.   verapamil  (CALAN -SR) 180 MG CR tablet  Pt would like a call to follow up to get medication resolved

## 2024-08-10 ENCOUNTER — Encounter: Payer: Self-pay | Admitting: Family Medicine

## 2024-08-10 ENCOUNTER — Ambulatory Visit (INDEPENDENT_AMBULATORY_CARE_PROVIDER_SITE_OTHER): Admitting: Family Medicine

## 2024-08-10 VITALS — BP 128/62 | HR 65 | Temp 97.9°F | Ht 70.0 in | Wt 135.6 lb

## 2024-08-10 DIAGNOSIS — I5033 Acute on chronic diastolic (congestive) heart failure: Secondary | ICD-10-CM

## 2024-08-10 DIAGNOSIS — K219 Gastro-esophageal reflux disease without esophagitis: Secondary | ICD-10-CM | POA: Diagnosis not present

## 2024-08-10 DIAGNOSIS — I10 Essential (primary) hypertension: Secondary | ICD-10-CM

## 2024-08-10 DIAGNOSIS — I4821 Permanent atrial fibrillation: Secondary | ICD-10-CM

## 2024-08-10 NOTE — Patient Instructions (Addendum)
 If any of your symptoms worsen don't hesitate to see us  back but glad the hot flashes resolved and other conditions seem stable  Recommended follow up: Return in about 6 months (around 02/07/2025) for physical or sooner if needed.Schedule b4 you leave.

## 2024-08-10 NOTE — Progress Notes (Signed)
 Phone [phone] In person visit   Subjective:   Eric Lambert is a 89 y.o. year old very pleasant male patient who presents for/with See problem oriented charting Chief Complaint  Patient presents with   Chronic diastolic congestive heart failure   Cough    Would like to discuss chronic cough and shortness of breath;    Hot Flashes    Past Medical History-  Patient Active Problem List   Diagnosis Date Noted   Bilateral pleural effusion 10/30/2023    Priority: High   Acute on chronic diastolic congestive heart failure (HCC) 10/05/2023    Priority: High   Pleural effusion 10/05/2023    Priority: High   PPM-[employer] 02/07/2009    Priority: High   Permanent atrial fibrillation (HCC) 10/21/2007    Priority: High   CKD stage 3a, GFR 45-59 ml/min (HCC) 10/07/2023    Priority: Medium    Hyperglycemia 11/07/2022    Priority: Medium    Esophageal obstruction due to food impaction     Priority: Medium    Aortic atherosclerosis (HCC) 08/13/2020    Priority: Medium    BPPV (benign paroxysmal positional vertigo) 12/10/2017    Priority: Medium    Bradycardia 02/02/2013    Priority: Medium    Thrombocytopenia (HCC) 08/04/2011    Priority: Medium    Obstructive sleep apnea 10/21/2007    Priority: Medium    Essential hypertension 10/21/2007    Priority: Medium    GERD 10/21/2007    Priority: Medium    BPH associated with nocturia 10/21/2007    Priority: Medium    Erectile dysfunction 05/28/2016    Priority: Low   Macular degeneration 05/28/2016    Priority: Low   Right hip pain 08/04/2013    Priority: Low   Secondary cardiomyopathy (HCC) 02/07/2009    Priority: Low   Osteopenia 10/22/2007    Priority: Low   SUBACUTE BACTERIAL ENDOCARDITIS 10/21/2007    Priority: Low   S/P total left hip arthroplasty 01/07/2022    Priority: 1.   Degenerative disc disease, lumbar 07/18/2019    Priority: 1.   Chronic low back pain 06/09/2018    Priority: 1.   Right  knee pain 06/02/2017    Priority: 1.   Hyperbilirubinemia 10/30/2023   Recurrent right pleural effusion 10/28/2023   NSVT (nonsustained ventricular tachycardia) (HCC) 04/30/2023    Medications- reviewed and updated Current Outpatient Medications  Medication Sig Dispense Refill   apixaban  (ELIQUIS ) 5 MG TABS tablet Take 1 tablet by mouth twice daily 180 tablet 1   Cholecalciferol (VITAMIN D -3 PO) Take 1 tablet by mouth daily.     Cyanocobalamin  (VITAMIN B-12 PO) Take 1 tablet by mouth daily.     digoxin  (LANOXIN ) 0.125 MG tablet Take 0.5 tablets (0.0625 mg total) by mouth daily. 45 tablet 2   furosemide  (LASIX ) 40 MG tablet Take 2 tablets daily. 180 tablet 1   Multiple Vitamins-Minerals (OCUVITE EYE HEALTH FORMULA PO) Take 1 tablet by mouth in the morning and at bedtime.     potassium chloride  SA (KLOR-CON  M) 20 MEQ tablet Take 2 tablets (40 mEq total) by mouth daily. 60 tablet 0   verapamil  (CALAN -SR) 180 MG CR tablet Take 1 tablet by mouth in the evening 30 tablet 0   No current facility-administered medications for this visit.     Objective:  BP 128/62 (BP Location: Left Arm, Patient Position: Sitting, Cuff Size: Normal)   Pulse 65   Temp 97.9 F (36.6 C) (Temporal)  Ht 5' 10 (1.778 m)   Wt 135 lb 9.6 oz (61.5 kg)   SpO2 99%   BMI 19.46 kg/m  Gen: NAD, resting comfortably CV: irregularly irregular stable murmur- known aortic sclerosis and moderate mitral valve regurgitation Lungs: CTAB no crackles, wheeze, rhonchi Ext: no edema Skin: warm, dry     Assessment and Plan     # Hot flahses S: Patient called on 07/18/2024 reporting hot flashes or just feeling really hot and feeling strange but difficult to describe fully for approximately a month.  No reported irregular events on his pacemaker.  Gets very sweaty during the episodes.  Reassuring blood pressures and heart rates during these times but were high normal. Thinks 3-4 spells over a month. Not aware of any illness -  We recommended seeing patient sooner than scheduled 08/10/2024 but he preferred to keep visit today - Since that time he reports no further episodes. Had something similar about 30 years ago  A/P: unclear cause of episodes- offered to check TSH  and other labs - he states no longer concerned and he wants to monitor for recurrence only   # Intermittent cough-gets more cough at night.  Feels like he can suppress it if he desires such as drinking water .  Not on Prilosec recently but does have history of GERD and has had some very mild sensations of reflux a few times in the last week-encouraged him to restart PPI Prilosec which he has at home-he agrees  # Atrial fibrillation-cardiology Dr. Fernande #Bradycardia-patient with pacemaker in place S: Rate controlled with verapamil  180  mg extended release, digoxin  0.125 mg daily - half dose daily with 1 extra half dose once per week he reports  Anticoagulated with Eliquis  5 mg twice daily.     A/P: appropriately anticoagulated and rate controlled- continue current medicine As far as digoxin -he is taking extra half dose 1 day a week as would prefer to be more mid range on his therapeutic digoxin  range-encouraged him to discuss this with cardiology-on 1 full tablet daily he was just above therapeutic range  # Right-sided heart failure with history of recurrent transudative pleural effusion #hypertension #Secondary cardiomyopathy-tachycardia induced in the past due to A-fib before rate control S: medication:  verapamil  180 mg daily, lasix  80 mg daily, digoxin  0.125  A/P: Blood pressure well-controlled-continue current medication Right-sided heart failure known-reports some shortness of breath intermittently and offered x-ray in light of prior pleural effusion but he would like to hold off for now as he wants to discuss with cardiology first.  He reports breathing was mildly labored about a week ago but that has cleared.  In general feels low energy though.  Also  feels somewhat more forgetful with age and occasionally misses evening doses of medications-encouraged consistency and discussed some ways to remember such as alarms   # Fatigue-offered update TSH, T3, T4 with last TSH around 8 but T3 and T4 normal-she wants to monitor and recheck next labs  Recommended follow up: Return in about 6 months (around 02/07/2025) for physical or sooner if needed.Schedule b4 you leave. Future Appointments  Date Time Provider Department Center  09/16/2024  7:00 AM [facility] DEVICE REMOTES [facility]  10/12/2024  2:30 PM Cindie Ole DASEN, MD [facility]  12/16/2024  7:00 AM [facility] DEVICE REMOTES [facility]  02/13/2025  3:00 PM [facility] ANNUAL WELLNESS VISIT 1 [facility]  02/15/2025 10:00 AM Katrinka Garnette KIDD, MD [facility]  03/17/2025  7:00 AM [facility]  [facility] DEVICE REMOTES [facility]  06/16/2025  7:00 AM [facility] DEVICE REMOTES [facility]  09/15/2025  7:00 AM [facility] HVT DEVICE REMOTES [facility]    Lab/Order associations:   ICD-10-CM   1. Permanent atrial fibrillation (HCC)  I48.21     2. Acute on chronic diastolic congestive heart failure (HCC)  I50.33     3. Gastroesophageal reflux disease without esophagitis  K21.9     4. Essential hypertension  I10       No orders of the defined types were placed in this encounter.   Return precautions advised.  Garnette Lukes, MD

## 2024-08-17 ENCOUNTER — Other Ambulatory Visit: Payer: Self-pay

## 2024-08-17 NOTE — Patient Instructions (Signed)
 Visit Information  Thank you for taking time to visit with me today. Please don't hesitate to contact me if I can be of assistance to you before our next scheduled telephone appointment.  Our next appointment is by telephone on 08/24/2024 at 2pm.  Following is a copy of your care plan:   Goals Addressed             This Visit's Progress    VBCI RN Care Plan       Problems:  Chronic Disease Management support and education needs related to CHF  Goal: Over the next 4 months the Patient will continue to work with RN Care Manager and/or Social Worker to address care management and care coordination needs related to CHF as evidenced by adherence to care management team scheduled appointments      Interventions:   Heart Failure Interventions: Provided education on low sodium diet Advised patient to weigh each morning after emptying bladder Reviewed role of diuretics in prevention of fluid overload and management of heart failure; Discussed the importance of keeping all appointments with provider Provided patient with education about the role of exercise in the management of heart failure Advised patient to discuss Home Health Services including RN, PT/OT ,and Aide with provider Screening for signs and symptoms of depression related to chronic disease state  Assessed social determinant of health barriers   Patient Self-Care Activities:  Attend all scheduled provider appointments Call pharmacy for medication refills 3-7 days in advance of running out of medications Call provider office for new concerns or questions  Perform all self care activities independently  Take medications as prescribed   call office if I gain more than 2 pounds in one day or 5 pounds in one week keep legs up while sitting track weight in diary use salt in moderation watch for swelling in feet, ankles and legs every day bring diary to all appointments develop a rescue plan follow rescue plan if symptoms  flare-up eat more whole grains, fruits and vegetables, lean meats and healthy fats know when to call the doctor:reviewed weight gain of 2 lbs over 24 hrs or 5 lbs over week, increased dyspnea that does not improve with resting, increased swelling to lower legs track symptoms and what helps feel better or worse dress right for the weather, hot or cold  Plan:  The patient has been provided with contact information for the care management team and has been advised to call with any health related questions or concerns.  Next appt with RNCM is Oct. 1 2025 at 2pm.             Patient verbalizes understanding of instructions and care plan provided today and agrees to view in MyChart. Active MyChart status and patient understanding of how to access instructions and care plan via MyChart confirmed with patient.     The patient has been provided with contact information for the care management team and has been advised to call with any health related questions or concerns.   Please call the care guide team at [phone] if you need to cancel or reschedule your appointment.   Please call 1-800-273-TALK (toll free, 24 hour hotline) if you are experiencing a Mental Health or Behavioral Health Crisis or need someone to talk to.  Torris House A. Gordy RN, BA, [name], CRRN [place]  [facility] Population Health RN Care Manager Direct Dial: [phone]  Fax: [phone]

## 2024-08-18 ENCOUNTER — Other Ambulatory Visit: Payer: Self-pay | Admitting: Family Medicine

## 2024-08-18 MED ORDER — POTASSIUM CHLORIDE CRYS ER 20 MEQ PO TBCR: 40.0000 meq | EXTENDED_RELEASE_TABLET | Freq: Every day | ORAL | 0 refills | Status: AC

## 2024-08-18 NOTE — Telephone Encounter (Signed)
 Copied from CRM #[id number]. Topic: Clinical - Medication Refill >> Aug 18, 2024  8:12 AM Timindy P wrote: Medication: potassium chloride  SA (KLOR-CON  M) 20 MEQ tablet  Has the patient contacted their pharmacy? Yes (Agent: If no, request that the patient contact the pharmacy for the refill. If patient does not wish to contact the pharmacy document the reason why and proceed with request.) (Agent: If yes, when and what did the pharmacy advise?)  This is the patient's preferred pharmacy:  [facility] [address], [place] - [address] [place] [address] Phone: [phone] Fax: [phone]  Is this the correct pharmacy for this prescription? Yes If no, delete pharmacy and type the correct one.   Has the prescription been filled recently? No  Is the patient out of the medication? Yes  Has the patient been seen for an appointment in the last year OR does the patient have an upcoming appointment? Yes  Can we respond through MyChart? Yes  Agent: Please be advised that Rx refills may take up to 3 business days. We ask that you follow-up with your pharmacy.

## 2024-08-24 ENCOUNTER — Other Ambulatory Visit: Payer: Self-pay

## 2024-08-25 ENCOUNTER — Other Ambulatory Visit: Payer: Self-pay | Admitting: Family Medicine

## 2024-08-25 NOTE — Progress Notes (Signed)
 Remote PPM Transmission

## 2024-08-26 ENCOUNTER — Other Ambulatory Visit: Payer: Self-pay | Admitting: Family Medicine

## 2024-08-26 NOTE — Telephone Encounter (Signed)
 Copied from CRM [id number]. Topic: Clinical - Medication Refill >> Aug 26, 2024  2:58 PM Berneda F wrote: Medication:  digoxin  (LANOXIN ) 0.125 MG tablet  Has the patient contacted their pharmacy? Yes (Agent: If no, request that the patient contact the pharmacy for the refill. If patient does not wish to contact the pharmacy document the reason why and proceed with request.) (Agent: If yes, when and what did the pharmacy advise?)  This is the patient's preferred pharmacy:  [facility] [address] [place] - [address] [place] [address] Phone: [phone] Fax: [phone]  [facility] [address] [place] [address] Phone: [phone] Fax: [phone]  Is this the correct pharmacy for this prescription? Yes If no, delete pharmacy and type the correct one.   Has the prescription been filled recently? No  Is the patient out of the medication? No  Has the patient been seen for an appointment in the last year OR does the patient have an upcoming appointment? Yes  Can we respond through MyChart? Yes  Agent: Please be advised that Rx refills may take up to 3 business days. We ask that you follow-up with your pharmacy.

## 2024-09-01 ENCOUNTER — Other Ambulatory Visit: Payer: Self-pay

## 2024-09-01 ENCOUNTER — Telehealth: Payer: Self-pay | Admitting: Family Medicine

## 2024-09-01 NOTE — Telephone Encounter (Signed)
 Digoxin  is managed by cardiology. I asked him to communicate with cardiology the change that he made at last dose- basically 1 day a week taking extra half dose as he wanted to be more mid range in therapeutic range (and I told him that was not necessary but to chat with cardiology about his preference)  Riniyah Speich team- Any medicine we prescribe you can send 90 days.

## 2024-09-01 NOTE — Telephone Encounter (Unsigned)
 Copied from CRM #[id number]. Topic: Clinical - Prescription Issue >> Sep 01, 2024 10:43 AM [name] wrote: Reason for CRM: digoxin  digoxin  (LANOXIN ) 0.125 MG tablet  1. Patient stated that the verbiage on his Rx: Digoxin  digoxin  (LANOXIN ) 0.125 MG tablet between Katrinka Garnette KIDD, MD (PCP) and his pharmacy [facility])  has caused some confusion for the patient and he has stated that it is quite a nuisance, and we please clarify with the PCP what the verbiage on the Rx should be and update it as needed.   2. Patient has also requested that all of his current Rx's that he takes daily are placed on a 90 day supply.   3. Patient stated that the updates should be made ASAP

## 2024-09-16 ENCOUNTER — Ambulatory Visit: Payer: Medicare HMO

## 2024-09-16 DIAGNOSIS — I495 Sick sinus syndrome: Secondary | ICD-10-CM | POA: Diagnosis not present

## 2024-09-17 LAB — CUP PACEART REMOTE DEVICE CHECK
Battery Remaining Longevity: 108 mo
Battery Remaining Percentage: 100 %
Brady Statistic RV Percent Paced: 66 %
Date Time Interrogation Session: 20251024031500
Implantable Lead Connection Status: 753985
Implantable Lead Connection Status: 753985
Implantable Lead Implant Date: 19960809
Implantable Lead Implant Date: 19960809
Implantable Lead Location: 753859
Implantable Lead Location: 753860
Implantable Lead Model: 4285
Implantable Lead Serial Number: 209144
Implantable Pulse Generator Implant Date: 20241023
Lead Channel Impedance Value: 723 Ohm
Lead Channel Pacing Threshold Amplitude: 1.2 V
Lead Channel Pacing Threshold Pulse Width: 0.4 ms
Lead Channel Setting Pacing Amplitude: 2.5 V
Lead Channel Setting Pacing Pulse Width: 0.4 ms
Lead Channel Setting Sensing Sensitivity: 2.5 mV
Pulse Gen Serial Number: 924391
Zone Setting Status: 755011

## 2024-09-19 ENCOUNTER — Ambulatory Visit: Payer: Self-pay | Admitting: Cardiology

## 2024-09-21 NOTE — Progress Notes (Signed)
 Remote PPM Transmission

## 2024-09-28 ENCOUNTER — Other Ambulatory Visit: Payer: Self-pay | Admitting: Family Medicine

## 2024-10-11 NOTE — Progress Notes (Unsigned)
  Electrophysiology Office Follow up Visit Note:    Date:  10/12/2024   ID:  Eric Lambert, DOB Nov 11, 1929, MRN [id number]  PCP:  Katrinka Garnette KIDD, MD  [facility] Cardiologist:  Elspeth Sage, MD (Inactive)  [facility] Electrophysiologist:  Elspeth Sage, MD (Inactive)    Interval History:     Eric Lambert is a 89 y.o. male who presents for a follow up visit.   The patient was previously followed by Dr. Sage and most recently saw Charlies in May May 13, 2024.  The patient has a history of chronic diastolic heart failure, permanent atrial fibrillation and complete heart block with a permanent pacemaker.  At the appointment with Renee there was discussion about his digoxin .  Renee recommended stopping the digoxin  given a elevated level and associated potential side effects.  The patient was not comfortable with this recommendation and only wanted to decrease the medication.  Today he is doing well.  He is trying to stay active.  He still mows his grass but he has to take several breaks while doing so.      Past medical, surgical, social and family history were reviewed.  ROS:   Please see the history of present illness.    All other systems reviewed and are negative.  EKGs/Labs/Other Studies Reviewed:    The following studies were reviewed today:  October 12, 2024 in-clinic device interrogation personally reviewed Battery and lead parameter stable.        Physical Exam:    VS:  BP 125/76 (BP Location: Left Arm, Patient Position: Sitting, Cuff Size: Normal)   Pulse 65   Ht 5' 10 (1.778 m)   Wt 143 lb 3.2 oz (65 kg)   SpO2 96%   BMI 20.55 kg/m     Wt Readings from Last 3 Encounters:  10/12/24 143 lb 3.2 oz (65 kg)  08/10/24 135 lb 9.6 oz (61.5 kg)  05/13/24 137 lb (62.1 kg)     GEN: no distress.  Elderly CARD: Irregularly irregular, No MRG.  Generator pocket well-healed RESP: No IWOB. CTAB.      ASSESSMENT:    1. Cardiac pacemaker  in situ   2. Tachy-brady syndrome (HCC)   3. Permanent atrial fibrillation (HCC)   4. Encounter for long-term (current) use of high-risk medication    PLAN:    In order of problems listed above:  #Permanent atrial fibrillation On Eliquis  for stroke prophylaxis Rates are fairly well-controlled.  He is on verapamil  daily in addition to digoxin . Continue digoxin  at the current dose.  #Complete heart block #Permanent pacemaker in situ The patient paces 66% of the time on most recent remote.  Device seems to be functioning appropriately.  Continue remote monitoring.  I discussed my upcoming departure from [facility] during today's clinic appointment.  He will continue to follow-up with one of my partners moving forward.  Follow-up 6 months with Dr. Kennyth.  Signed, Ole Holts, MD, [facility], [facility] 10/12/2024 3:08 PM    Electrophysiology [facility]

## 2024-10-12 ENCOUNTER — Ambulatory Visit: Attending: Cardiology | Admitting: Cardiology

## 2024-10-12 ENCOUNTER — Encounter: Payer: Self-pay | Admitting: Cardiology

## 2024-10-12 VITALS — BP 125/76 | HR 65 | Ht 70.0 in | Wt 143.2 lb

## 2024-10-12 DIAGNOSIS — Z950 Presence of cardiac pacemaker: Secondary | ICD-10-CM

## 2024-10-12 DIAGNOSIS — Z79899 Other long term (current) drug therapy: Secondary | ICD-10-CM | POA: Diagnosis not present

## 2024-10-12 DIAGNOSIS — I495 Sick sinus syndrome: Secondary | ICD-10-CM | POA: Diagnosis not present

## 2024-10-12 DIAGNOSIS — I4821 Permanent atrial fibrillation: Secondary | ICD-10-CM | POA: Diagnosis not present

## 2024-10-12 LAB — CUP PACEART INCLINIC DEVICE CHECK
Date Time Interrogation Session: 20251119150443
Implantable Lead Connection Status: 753985
Implantable Lead Connection Status: 753985
Implantable Lead Implant Date: 19960809
Implantable Lead Implant Date: 19960809
Implantable Lead Location: 753859
Implantable Lead Location: 753860
Implantable Lead Model: 4285
Implantable Lead Serial Number: 209144
Implantable Pulse Generator Implant Date: 20241023
Lead Channel Impedance Value: 763 Ohm
Lead Channel Pacing Threshold Amplitude: 0.9 V
Lead Channel Pacing Threshold Pulse Width: 0.4 ms
Lead Channel Sensing Intrinsic Amplitude: 8.3 mV
Lead Channel Setting Pacing Amplitude: 2.5 V
Lead Channel Setting Pacing Pulse Width: 0.4 ms
Lead Channel Setting Sensing Sensitivity: 2.5 mV
Pulse Gen Serial Number: 924391
Zone Setting Status: 755011

## 2024-10-12 NOTE — Patient Instructions (Signed)
 Medication Instructions:  Your physician recommends that you continue on your current medications as directed. Please refer to the Current Medication list given to you today.  *If you need a refill on your cardiac medications before your next appointment, please call your pharmacy*  Follow-Up: At [facility], you and your health needs are our priority.  As part of our continuing mission to provide you with exceptional heart care, our providers are all part of one team.  This team includes your primary Cardiologist (physician) and Advanced Practice Providers or APPs (Physician Assistants and Nurse Practitioners) who all work together to provide you with the care you need, when you need it.  Your next appointment:   6 months  Provider:   Fonda Kitty, MD, Daphne Barrack, NP, Ozell Jodie Passey, PA-C, or Charlies Arthur, PA-C

## 2024-10-13 ENCOUNTER — Ambulatory Visit: Payer: Self-pay | Admitting: Cardiology

## 2024-10-30 ENCOUNTER — Other Ambulatory Visit: Payer: Self-pay | Admitting: Family Medicine

## 2024-11-10 ENCOUNTER — Other Ambulatory Visit: Payer: Self-pay | Admitting: Family Medicine

## 2024-11-14 ENCOUNTER — Other Ambulatory Visit: Payer: Self-pay

## 2024-11-14 DIAGNOSIS — I4821 Permanent atrial fibrillation: Secondary | ICD-10-CM

## 2024-11-15 MED ORDER — APIXABAN 5 MG PO TABS: 5.0000 mg | ORAL_TABLET | Freq: Two times a day (BID) | ORAL | 1 refills | Status: AC

## 2024-11-15 NOTE — Telephone Encounter (Signed)
 Prescription refill request for Eliquis  received. Indication:afib Last office visit:11/25 Scr: 1.11  5/25 Age:89 Weight:65  kg  Prescription refilled

## 2024-11-23 ENCOUNTER — Other Ambulatory Visit: Payer: Self-pay | Admitting: Family Medicine

## 2024-11-24 ENCOUNTER — Other Ambulatory Visit: Payer: Self-pay | Admitting: Family Medicine

## 2024-12-04 ENCOUNTER — Other Ambulatory Visit: Payer: Self-pay | Admitting: Family Medicine

## 2024-12-12 ENCOUNTER — Other Ambulatory Visit: Payer: Self-pay | Admitting: Family Medicine

## 2024-12-16 ENCOUNTER — Ambulatory Visit: Payer: Medicare HMO

## 2024-12-16 DIAGNOSIS — I4821 Permanent atrial fibrillation: Secondary | ICD-10-CM | POA: Diagnosis not present

## 2024-12-19 ENCOUNTER — Ambulatory Visit: Payer: Self-pay | Admitting: Cardiology

## 2024-12-19 LAB — CUP PACEART REMOTE DEVICE CHECK
Battery Remaining Longevity: 108 mo
Battery Remaining Percentage: 100 %
Brady Statistic RV Percent Paced: 72 %
Date Time Interrogation Session: 20260123163000
Implantable Lead Connection Status: 753985
Implantable Lead Connection Status: 753985
Implantable Lead Implant Date: 19960809
Implantable Lead Implant Date: 19960809
Implantable Lead Location: 753859
Implantable Lead Location: 753860
Implantable Lead Model: 4285
Implantable Lead Serial Number: 209144
Implantable Pulse Generator Implant Date: 20241023
Lead Channel Impedance Value: 753 Ohm
Lead Channel Pacing Threshold Amplitude: 1 V
Lead Channel Pacing Threshold Pulse Width: 0.4 ms
Lead Channel Setting Pacing Amplitude: 2.5 V
Lead Channel Setting Pacing Pulse Width: 0.4 ms
Lead Channel Setting Sensing Sensitivity: 2.5 mV
Pulse Gen Serial Number: 924391
Zone Setting Status: 755011

## 2024-12-21 NOTE — Progress Notes (Signed)
 Remote PPM Transmission

## 2025-02-13 ENCOUNTER — Encounter

## 2025-02-15 ENCOUNTER — Encounter: Admitting: Family Medicine
